# Patient Record
Sex: Female | Born: 1955 | Race: White | Hispanic: No | Smoking: Former smoker
Health system: Southern US, Community
[De-identification: ages and names within clinical notes are randomized; demographics above are authoritative.]

## PROBLEM LIST (undated history)

## (undated) DIAGNOSIS — IMO0001 Reserved for inherently not codable concepts without codable children: Secondary | ICD-10-CM

## (undated) DIAGNOSIS — J189 Pneumonia, unspecified organism: Secondary | ICD-10-CM

## (undated) DIAGNOSIS — F419 Anxiety disorder, unspecified: Secondary | ICD-10-CM

## (undated) DIAGNOSIS — K219 Gastro-esophageal reflux disease without esophagitis: Secondary | ICD-10-CM

## (undated) DIAGNOSIS — Z8709 Personal history of other diseases of the respiratory system: Secondary | ICD-10-CM

## (undated) DIAGNOSIS — I10 Essential (primary) hypertension: Secondary | ICD-10-CM

## (undated) DIAGNOSIS — R0602 Shortness of breath: Secondary | ICD-10-CM

## (undated) DIAGNOSIS — R112 Nausea with vomiting, unspecified: Secondary | ICD-10-CM

## (undated) DIAGNOSIS — R1310 Dysphagia, unspecified: Secondary | ICD-10-CM

## (undated) DIAGNOSIS — J439 Emphysema, unspecified: Secondary | ICD-10-CM

## (undated) DIAGNOSIS — Z860100 Personal history of colon polyps, unspecified: Secondary | ICD-10-CM

## (undated) DIAGNOSIS — G8929 Other chronic pain: Secondary | ICD-10-CM

## (undated) DIAGNOSIS — J449 Chronic obstructive pulmonary disease, unspecified: Secondary | ICD-10-CM

## (undated) DIAGNOSIS — R351 Nocturia: Secondary | ICD-10-CM

## (undated) DIAGNOSIS — M549 Dorsalgia, unspecified: Secondary | ICD-10-CM

## (undated) DIAGNOSIS — R12 Heartburn: Secondary | ICD-10-CM

## (undated) DIAGNOSIS — M797 Fibromyalgia: Secondary | ICD-10-CM

## (undated) DIAGNOSIS — K829 Disease of gallbladder, unspecified: Secondary | ICD-10-CM

## (undated) DIAGNOSIS — I639 Cerebral infarction, unspecified: Secondary | ICD-10-CM

## (undated) DIAGNOSIS — Z9889 Other specified postprocedural states: Secondary | ICD-10-CM

## (undated) DIAGNOSIS — F32A Depression, unspecified: Secondary | ICD-10-CM

## (undated) DIAGNOSIS — J45909 Unspecified asthma, uncomplicated: Secondary | ICD-10-CM

## (undated) DIAGNOSIS — E785 Hyperlipidemia, unspecified: Secondary | ICD-10-CM

## (undated) DIAGNOSIS — A630 Anogenital (venereal) warts: Secondary | ICD-10-CM

## (undated) HISTORY — DX: Shortness of breath: R06.02

## (undated) HISTORY — PX: POLYPECTOMY: SHX149

## (undated) HISTORY — DX: Dysphagia, unspecified: R13.10

## (undated) HISTORY — DX: Unspecified asthma, uncomplicated: J45.909

## (undated) HISTORY — PX: DIAGNOSTIC LAPAROSCOPY: SUR761

## (undated) HISTORY — DX: Anogenital (venereal) warts: A63.0

## (undated) HISTORY — DX: Emphysema, unspecified: J43.9

## (undated) HISTORY — DX: Heartburn: R12

## (undated) HISTORY — PX: ESOPHAGOGASTRODUODENOSCOPY: SHX1529

## (undated) HISTORY — PX: ADENOIDECTOMY: SUR15

## (undated) HISTORY — DX: Cerebral infarction, unspecified: I63.9

## (undated) HISTORY — DX: Anxiety disorder, unspecified: F41.9

## (undated) HISTORY — PX: ANTERIOR (CYSTOCELE) AND POSTERIOR REPAIR (RECTOCELE) WITH XENFORM GRAFT AND SACROSPINOUS FIXATION: SHX6492

## (undated) HISTORY — PX: BREAST SURGERY: SHX581

## (undated) HISTORY — DX: Depression, unspecified: F32.A

## (undated) HISTORY — DX: Disease of gallbladder, unspecified: K82.9

## (undated) HISTORY — PX: TONSILLECTOMY: SUR1361

## (undated) HISTORY — PX: NECK SURGERY: SHX720

---
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
## 1981-02-19 HISTORY — PX: CHOLECYSTECTOMY: SHX55

## 1981-02-19 HISTORY — PX: ABDOMINAL HYSTERECTOMY: SHX81

## 2008-10-08 ENCOUNTER — Encounter
Admission: RE | Admit: 2008-10-08 | Discharge: 2008-10-08 | Payer: Self-pay | Admitting: Physical Medicine and Rehabilitation

## 2008-12-07 ENCOUNTER — Emergency Department (HOSPITAL_COMMUNITY): Admission: EM | Admit: 2008-12-07 | Discharge: 2008-12-08 | Payer: Self-pay | Admitting: Emergency Medicine

## 2009-02-15 ENCOUNTER — Encounter: Admission: RE | Admit: 2009-02-15 | Discharge: 2009-02-15 | Payer: Self-pay | Admitting: Family Medicine

## 2009-02-19 HISTORY — PX: OTHER SURGICAL HISTORY: SHX169

## 2009-08-30 ENCOUNTER — Encounter: Admission: RE | Admit: 2009-08-30 | Discharge: 2009-08-30 | Payer: Self-pay | Admitting: Family Medicine

## 2009-10-03 ENCOUNTER — Encounter: Admission: RE | Admit: 2009-10-03 | Discharge: 2009-10-03 | Payer: Self-pay | Admitting: Surgery

## 2009-11-14 ENCOUNTER — Ambulatory Visit (HOSPITAL_COMMUNITY): Admission: RE | Admit: 2009-11-14 | Discharge: 2009-11-14 | Payer: Self-pay | Admitting: Surgery

## 2010-03-12 ENCOUNTER — Encounter: Payer: Self-pay | Admitting: Family Medicine

## 2010-05-04 LAB — COMPREHENSIVE METABOLIC PANEL
AST: 17 U/L (ref 0–37)
Alkaline Phosphatase: 79 U/L (ref 39–117)
BUN: 14 mg/dL (ref 6–23)
CO2: 28 mEq/L (ref 19–32)
Creatinine, Ser: 0.81 mg/dL (ref 0.4–1.2)
Glucose, Bld: 98 mg/dL (ref 70–99)
Sodium: 137 mEq/L (ref 135–145)
Total Bilirubin: 0.7 mg/dL (ref 0.3–1.2)
Total Protein: 7.2 g/dL (ref 6.0–8.3)

## 2010-05-04 LAB — DIFFERENTIAL
Basophils Absolute: 0 10*3/uL (ref 0.0–0.1)
Basophils Relative: 0 % (ref 0–1)
Eosinophils Relative: 2 % (ref 0–5)
Lymphs Abs: 2.3 10*3/uL (ref 0.7–4.0)
Monocytes Absolute: 0.5 10*3/uL (ref 0.1–1.0)
Monocytes Relative: 6 % (ref 3–12)

## 2010-05-04 LAB — CBC
MCV: 93.1 fL (ref 78.0–100.0)
Platelets: 208 10*3/uL (ref 150–400)

## 2010-05-04 LAB — SURGICAL PCR SCREEN
MRSA, PCR: NEGATIVE
Staphylococcus aureus: NEGATIVE

## 2010-05-25 LAB — URINALYSIS, ROUTINE W REFLEX MICROSCOPIC
Bilirubin Urine: NEGATIVE
Ketones, ur: NEGATIVE mg/dL
Protein, ur: NEGATIVE mg/dL
Specific Gravity, Urine: 1.021 (ref 1.005–1.030)
Urobilinogen, UA: 0.2 mg/dL (ref 0.0–1.0)

## 2010-05-25 LAB — URINE CULTURE: Colony Count: 30000

## 2010-05-25 LAB — COMPREHENSIVE METABOLIC PANEL
ALT: 33 U/L (ref 0–35)
AST: 21 U/L (ref 0–37)
Albumin: 3.8 g/dL (ref 3.5–5.2)
CO2: 30 mEq/L (ref 19–32)
Calcium: 10.1 mg/dL (ref 8.4–10.5)
Chloride: 101 mEq/L (ref 96–112)
Sodium: 141 mEq/L (ref 135–145)

## 2010-05-25 LAB — DIFFERENTIAL
Basophils Relative: 0 % (ref 0–1)
Neutro Abs: 8.5 10*3/uL — ABNORMAL HIGH (ref 1.7–7.7)

## 2010-05-25 LAB — CBC
MCV: 90.5 fL (ref 78.0–100.0)
Platelets: 215 10*3/uL (ref 150–400)

## 2010-05-25 LAB — URINE MICROSCOPIC-ADD ON

## 2010-08-07 ENCOUNTER — Emergency Department (HOSPITAL_COMMUNITY)
Admission: EM | Admit: 2010-08-07 | Discharge: 2010-08-08 | Disposition: A | Discharge status: Home / Self Care | Payer: Medicare Other | Attending: Emergency Medicine | Admitting: Emergency Medicine

## 2010-08-07 DIAGNOSIS — R51 Headache: Secondary | ICD-10-CM | POA: Insufficient documentation

## 2010-08-07 DIAGNOSIS — W1809XA Striking against other object with subsequent fall, initial encounter: Secondary | ICD-10-CM | POA: Insufficient documentation

## 2010-08-07 DIAGNOSIS — J449 Chronic obstructive pulmonary disease, unspecified: Secondary | ICD-10-CM | POA: Insufficient documentation

## 2010-08-07 DIAGNOSIS — S0003XA Contusion of scalp, initial encounter: Secondary | ICD-10-CM | POA: Insufficient documentation

## 2010-08-07 DIAGNOSIS — I10 Essential (primary) hypertension: Secondary | ICD-10-CM | POA: Insufficient documentation

## 2010-08-07 DIAGNOSIS — Y9289 Other specified places as the place of occurrence of the external cause: Secondary | ICD-10-CM | POA: Insufficient documentation

## 2010-08-07 DIAGNOSIS — S1093XA Contusion of unspecified part of neck, initial encounter: Secondary | ICD-10-CM | POA: Insufficient documentation

## 2010-08-07 DIAGNOSIS — S0990XA Unspecified injury of head, initial encounter: Secondary | ICD-10-CM | POA: Insufficient documentation

## 2010-08-07 DIAGNOSIS — H538 Other visual disturbances: Secondary | ICD-10-CM | POA: Insufficient documentation

## 2010-08-07 DIAGNOSIS — J4489 Other specified chronic obstructive pulmonary disease: Secondary | ICD-10-CM | POA: Insufficient documentation

## 2010-08-08 ENCOUNTER — Emergency Department (HOSPITAL_COMMUNITY): Payer: Medicare Other

## 2012-02-17 ENCOUNTER — Emergency Department (HOSPITAL_COMMUNITY): Payer: Medicare Other

## 2012-02-17 ENCOUNTER — Inpatient Hospital Stay (HOSPITAL_COMMUNITY)
Admission: EM | Admit: 2012-02-17 | Discharge: 2012-02-21 | DRG: 190 | Disposition: A | Discharge status: Home / Self Care | Payer: Medicare Other | Attending: Internal Medicine | Admitting: Internal Medicine

## 2012-02-17 ENCOUNTER — Encounter (HOSPITAL_COMMUNITY): Payer: Self-pay | Admitting: Family Medicine

## 2012-02-17 DIAGNOSIS — R0609 Other forms of dyspnea: Secondary | ICD-10-CM | POA: Diagnosis present

## 2012-02-17 DIAGNOSIS — J441 Chronic obstructive pulmonary disease with (acute) exacerbation: Principal | ICD-10-CM | POA: Diagnosis present

## 2012-02-17 DIAGNOSIS — R197 Diarrhea, unspecified: Secondary | ICD-10-CM | POA: Diagnosis present

## 2012-02-17 DIAGNOSIS — E669 Obesity, unspecified: Secondary | ICD-10-CM | POA: Diagnosis present

## 2012-02-17 DIAGNOSIS — R0902 Hypoxemia: Secondary | ICD-10-CM | POA: Diagnosis present

## 2012-02-17 DIAGNOSIS — J069 Acute upper respiratory infection, unspecified: Secondary | ICD-10-CM | POA: Diagnosis present

## 2012-02-17 DIAGNOSIS — F17200 Nicotine dependence, unspecified, uncomplicated: Secondary | ICD-10-CM | POA: Diagnosis present

## 2012-02-17 DIAGNOSIS — Z6830 Body mass index (BMI) 30.0-30.9, adult: Secondary | ICD-10-CM

## 2012-02-17 DIAGNOSIS — Z91199 Patient's noncompliance with other medical treatment and regimen due to unspecified reason: Secondary | ICD-10-CM

## 2012-02-17 DIAGNOSIS — N390 Urinary tract infection, site not specified: Secondary | ICD-10-CM | POA: Diagnosis present

## 2012-02-17 DIAGNOSIS — Z9119 Patient's noncompliance with other medical treatment and regimen: Secondary | ICD-10-CM

## 2012-02-17 DIAGNOSIS — J9620 Acute and chronic respiratory failure, unspecified whether with hypoxia or hypercapnia: Secondary | ICD-10-CM | POA: Diagnosis present

## 2012-02-17 DIAGNOSIS — R0602 Shortness of breath: Secondary | ICD-10-CM

## 2012-02-17 DIAGNOSIS — Z720 Tobacco use: Secondary | ICD-10-CM

## 2012-02-17 DIAGNOSIS — Z79899 Other long term (current) drug therapy: Secondary | ICD-10-CM

## 2012-02-17 DIAGNOSIS — I10 Essential (primary) hypertension: Secondary | ICD-10-CM | POA: Diagnosis present

## 2012-02-17 HISTORY — DX: Essential (primary) hypertension: I10

## 2012-02-17 HISTORY — DX: Chronic obstructive pulmonary disease, unspecified: J44.9

## 2012-02-17 LAB — COMPREHENSIVE METABOLIC PANEL
Albumin: 3.6 g/dL (ref 3.5–5.2)
Alkaline Phosphatase: 86 U/L (ref 39–117)
CO2: 25 mEq/L (ref 19–32)
Chloride: 98 mEq/L (ref 96–112)
Glucose, Bld: 106 mg/dL — ABNORMAL HIGH (ref 70–99)
Potassium: 3.6 mEq/L (ref 3.5–5.1)
Sodium: 137 mEq/L (ref 135–145)

## 2012-02-17 LAB — INFLUENZA PANEL BY PCR (TYPE A & B)
H1N1 flu by pcr: NOT DETECTED
Influenza B By PCR: NEGATIVE

## 2012-02-17 LAB — URINALYSIS, ROUTINE W REFLEX MICROSCOPIC
Ketones, ur: NEGATIVE mg/dL
Nitrite: NEGATIVE
Urobilinogen, UA: 1 mg/dL (ref 0.0–1.0)
pH: 5.5 (ref 5.0–8.0)

## 2012-02-17 LAB — CBC WITH DIFFERENTIAL/PLATELET
Basophils Absolute: 0 10*3/uL (ref 0.0–0.1)
Eosinophils Relative: 1 % (ref 0–5)
Hemoglobin: 12.8 g/dL (ref 12.0–15.0)
Lymphocytes Relative: 8 % — ABNORMAL LOW (ref 12–46)
Lymphs Abs: 0.5 10*3/uL — ABNORMAL LOW (ref 0.7–4.0)
MCH: 29.8 pg (ref 26.0–34.0)
MCHC: 32.3 g/dL (ref 30.0–36.0)
MCV: 92.1 fL (ref 78.0–100.0)
Neutro Abs: 4.9 10*3/uL (ref 1.7–7.7)
Neutrophils Relative %: 81 % — ABNORMAL HIGH (ref 43–77)
Platelets: 161 10*3/uL (ref 150–400)
WBC: 6 10*3/uL (ref 4.0–10.5)

## 2012-02-17 LAB — URINE MICROSCOPIC-ADD ON

## 2012-02-17 MED ORDER — HYDROMORPHONE HCL PF 1 MG/ML IJ SOLN: 0.5000 mg | INTRAMUSCULAR | Status: DC | PRN

## 2012-02-17 MED ORDER — ALBUTEROL SULFATE (5 MG/ML) 0.5% IN NEBU: 5.0000 mg | INHALATION_SOLUTION | RESPIRATORY_TRACT | Status: AC | PRN

## 2012-02-17 MED ORDER — SODIUM CHLORIDE 0.9 % IV BOLUS (SEPSIS)
500.0000 mL | Freq: Once | INTRAVENOUS | Status: AC
  Administered 2012-02-17: 500 mL via INTRAVENOUS

## 2012-02-17 MED ORDER — CEPHALEXIN 250 MG PO CAPS
500.0000 mg | ORAL_CAPSULE | Freq: Once | ORAL | Status: AC
  Administered 2012-02-17: 500 mg via ORAL
  Filled 2012-02-17: qty 2

## 2012-02-17 MED ORDER — DEXTROSE 5 % IV SOLN
1.0000 g | INTRAVENOUS | Status: DC
  Administered 2012-02-17 – 2012-02-18 (×2): 1 g via INTRAVENOUS
  Filled 2012-02-17 (×3): qty 10

## 2012-02-17 MED ORDER — ALBUTEROL SULFATE (5 MG/ML) 0.5% IN NEBU
5.0000 mg | INHALATION_SOLUTION | Freq: Once | RESPIRATORY_TRACT | Status: AC
  Administered 2012-02-17: 5 mg via RESPIRATORY_TRACT

## 2012-02-17 MED ORDER — ENOXAPARIN SODIUM 40 MG/0.4ML ~~LOC~~ SOLN
40.0000 mg | SUBCUTANEOUS | Status: DC
  Administered 2012-02-17 – 2012-02-20 (×4): 40 mg via SUBCUTANEOUS
  Filled 2012-02-17 (×5): qty 0.4

## 2012-02-17 MED ORDER — SODIUM CHLORIDE 0.9 % IV SOLN: INTRAVENOUS | Status: AC

## 2012-02-17 MED ORDER — OXYCODONE HCL 5 MG PO TABS
5.0000 mg | ORAL_TABLET | ORAL | Status: DC | PRN
  Administered 2012-02-17 – 2012-02-20 (×7): 5 mg via ORAL
  Filled 2012-02-17 (×7): qty 1

## 2012-02-17 MED ORDER — METHYLPREDNISOLONE SODIUM SUCC 125 MG IJ SOLR
125.0000 mg | Freq: Four times a day (QID) | INTRAMUSCULAR | Status: DC
  Administered 2012-02-17 – 2012-02-18 (×3): 125 mg via INTRAVENOUS
  Filled 2012-02-17 (×4): qty 2

## 2012-02-17 MED ORDER — ONDANSETRON HCL 4 MG/2ML IJ SOLN
4.0000 mg | Freq: Four times a day (QID) | INTRAMUSCULAR | Status: DC | PRN
  Administered 2012-02-18 – 2012-02-19 (×2): 4 mg via INTRAVENOUS
  Filled 2012-02-17 (×2): qty 2

## 2012-02-17 MED ORDER — ZOLPIDEM TARTRATE 5 MG PO TABS
5.0000 mg | ORAL_TABLET | Freq: Every evening | ORAL | Status: DC | PRN
  Administered 2012-02-19: 5 mg via ORAL
  Filled 2012-02-17: qty 1

## 2012-02-17 MED ORDER — ONDANSETRON HCL 4 MG/2ML IJ SOLN
4.0000 mg | Freq: Once | INTRAMUSCULAR | Status: AC
  Administered 2012-02-17: 4 mg via INTRAVENOUS
  Filled 2012-02-17: qty 2

## 2012-02-17 MED ORDER — IPRATROPIUM BROMIDE 0.02 % IN SOLN
0.5000 mg | RESPIRATORY_TRACT | Status: AC
  Administered 2012-02-17 (×2): 0.5 mg via RESPIRATORY_TRACT
  Filled 2012-02-17 (×2): qty 2.5

## 2012-02-17 MED ORDER — GUAIFENESIN ER 600 MG PO TB12
1200.0000 mg | ORAL_TABLET | Freq: Two times a day (BID) | ORAL | Status: DC
  Administered 2012-02-17 – 2012-02-21 (×8): 1200 mg via ORAL
  Filled 2012-02-17 (×9): qty 2

## 2012-02-17 MED ORDER — ALBUTEROL (5 MG/ML) CONTINUOUS INHALATION SOLN
INHALATION_SOLUTION | RESPIRATORY_TRACT | Status: AC
  Filled 2012-02-17: qty 40

## 2012-02-17 MED ORDER — METHYLPREDNISOLONE SODIUM SUCC 125 MG IJ SOLR
80.0000 mg | Freq: Three times a day (TID) | INTRAMUSCULAR | Status: DC
  Administered 2012-02-18: 03:00:00 via INTRAVENOUS
  Filled 2012-02-17: qty 1.28

## 2012-02-17 MED ORDER — ACETAMINOPHEN 325 MG PO TABS
650.0000 mg | ORAL_TABLET | Freq: Four times a day (QID) | ORAL | Status: DC | PRN
  Administered 2012-02-17 – 2012-02-19 (×2): 650 mg via ORAL
  Filled 2012-02-17 (×2): qty 2

## 2012-02-17 MED ORDER — ONDANSETRON HCL 4 MG/2ML IJ SOLN: 4.0000 mg | Freq: Three times a day (TID) | INTRAMUSCULAR | Status: AC | PRN

## 2012-02-17 MED ORDER — SODIUM CHLORIDE 0.9 % IV SOLN
INTRAVENOUS | Status: DC
  Administered 2012-02-17: 22:00:00 via INTRAVENOUS
  Administered 2012-02-18: 20 mL/h via INTRAVENOUS
  Administered 2012-02-19: 04:00:00 via INTRAVENOUS

## 2012-02-17 MED ORDER — BENZONATATE 100 MG PO CAPS
100.0000 mg | ORAL_CAPSULE | Freq: Once | ORAL | Status: AC
  Administered 2012-02-17: 100 mg via ORAL
  Filled 2012-02-17: qty 1

## 2012-02-17 MED ORDER — ALUM & MAG HYDROXIDE-SIMETH 200-200-20 MG/5ML PO SUSP
30.0000 mL | Freq: Four times a day (QID) | ORAL | Status: DC | PRN
  Filled 2012-02-17: qty 30

## 2012-02-17 MED ORDER — ALBUTEROL SULFATE (5 MG/ML) 0.5% IN NEBU
5.0000 mg | INHALATION_SOLUTION | RESPIRATORY_TRACT | Status: AC
  Administered 2012-02-17 (×2): 5 mg via RESPIRATORY_TRACT
  Filled 2012-02-17 (×2): qty 40

## 2012-02-17 MED ORDER — ACETAMINOPHEN 650 MG RE SUPP: 650.0000 mg | Freq: Four times a day (QID) | RECTAL | Status: DC | PRN

## 2012-02-17 MED ORDER — NICOTINE 21 MG/24HR TD PT24
21.0000 mg | MEDICATED_PATCH | Freq: Every day | TRANSDERMAL | Status: DC
  Administered 2012-02-17 – 2012-02-21 (×5): 21 mg via TRANSDERMAL
  Filled 2012-02-17 (×5): qty 1

## 2012-02-17 MED ORDER — ONDANSETRON HCL 4 MG PO TABS
4.0000 mg | ORAL_TABLET | Freq: Four times a day (QID) | ORAL | Status: DC | PRN
  Administered 2012-02-20: 4 mg via ORAL
  Filled 2012-02-17: qty 1

## 2012-02-17 MED ORDER — SODIUM CHLORIDE 0.9 % IJ SOLN
3.0000 mL | Freq: Two times a day (BID) | INTRAMUSCULAR | Status: DC
  Administered 2012-02-18 – 2012-02-21 (×4): 3 mL via INTRAVENOUS

## 2012-02-17 MED ORDER — METHYLPREDNISOLONE SODIUM SUCC 125 MG IJ SOLR
125.0000 mg | Freq: Once | INTRAMUSCULAR | Status: AC
  Administered 2012-02-17: 125 mg via INTRAVENOUS
  Filled 2012-02-17: qty 2

## 2012-02-17 NOTE — ED Notes (Signed)
Attempted to call report to floor.  Was told RN would return my call in a few minutes

## 2012-02-17 NOTE — ED Provider Notes (Signed)
History     CSN: [id number]  Arrival date & time 02/17/12  1610   None     Chief Complaint  Patient presents with  . Shortness of Breath  . Fever  . Diarrhea     HPI chief complaint shortness of breath. Onset 3 days ago. Location chest. Or worsened by anything. Severity moderate. Timing constant. Context: Symptoms identical to past COPD exacerbations. Positive family history for a progress report he illnesses in 3 family members. Regarding social history see nurse's notes. I reviewed the patient's past medical, past surgical, social history as well as medications and allergies.  Past Medical History  Diagnosis Date  . COPD (chronic obstructive pulmonary disease)   . Hypertension     Past Surgical History  Procedure Date  . Abdominal hysterectomy     History reviewed. No pertinent family history.  History  Substance Use Topics  . Smoking status: Current Every Day Smoker  . Smokeless tobacco: Not on file  . Alcohol Use: Yes    OB History    Grav Para Term Preterm Abortions TAB SAB Ect Mult Living                  Review of Systems  Constitutional: Positive for fever. Negative for chills.  HENT: Positive for rhinorrhea. Negative for congestion, sore throat, mouth sores, trouble swallowing, neck pain, neck stiffness, voice change and sinus pressure.   Eyes: Negative for pain, discharge and itching.  Respiratory: Positive for cough and shortness of breath. Negative for chest tightness.   Cardiovascular: Negative for chest pain, palpitations and leg swelling.  Gastrointestinal: Positive for diarrhea. Negative for nausea, vomiting, abdominal pain, constipation and blood in stool.  Genitourinary: Negative for dysuria, urgency, frequency, hematuria, flank pain, decreased urine volume, difficulty urinating and pelvic pain.  Musculoskeletal: Negative for back pain and joint swelling.  Skin: Negative for rash and wound.  Neurological: Negative for dizziness, tremors,  seizures, syncope, facial asymmetry, speech difficulty, weakness, light-headedness and numbness.  Hematological: Negative for adenopathy. Does not bruise/bleed easily.  Psychiatric/Behavioral: Negative for confusion and decreased concentration.    Allergies  Wellbutrin  Home Medications  No current outpatient prescriptions on file.  BP 151/91  Pulse 123  Temp 99.9 F (37.7 C)  Resp 24  SpO2 90%  Physical Exam  Constitutional: She is oriented to person, place, and time. She appears well-developed and well-nourished. No distress.  HENT:  Head: Normocephalic and atraumatic.  Eyes: Conjunctivae normal are normal. Right eye exhibits no discharge. Left eye exhibits no discharge. No scleral icterus.  Neck: Normal range of motion. Neck supple.  Cardiovascular: Normal rate, regular rhythm, normal heart sounds and intact distal pulses.   No murmur heard. Pulmonary/Chest: No accessory muscle usage. Tachypnea noted. No respiratory distress. She has decreased breath sounds in the right upper field, the right middle field, the right lower field, the left upper field, the left middle field and the left lower field. She has wheezes in the right upper field, the right middle field, the right lower field, the left upper field, the left middle field and the left lower field. She has no rhonchi. She has no rales. She exhibits no tenderness.  Abdominal: Soft. Bowel sounds are normal. She exhibits no distension and no mass. There is no tenderness. There is no rebound and no guarding.  Musculoskeletal: Normal range of motion. She exhibits no edema and no tenderness.  Neurological: She is alert and oriented to person, place, and time.  Skin: Skin  is warm and dry. She is not diaphoretic.  Psychiatric: She has a normal mood and affect.    ED Course  Procedures (including critical care time)  Labs Reviewed  CBC WITH DIFFERENTIAL - Abnormal; Notable for the following:    Neutrophils Relative 81 (*)      Lymphocytes Relative 8 (*)     Lymphs Abs 0.5 (*)     All other components within normal limits  COMPREHENSIVE METABOLIC PANEL - Abnormal; Notable for the following:    Glucose, Bld 106 (*)     AST 62 (*)     ALT 103 (*)     All other components within normal limits  URINALYSIS, ROUTINE W REFLEX MICROSCOPIC - Abnormal; Notable for the following:    APPearance CLOUDY (*)     Specific Gravity, Urine 1.038 (*)     Hgb urine dipstick SMALL (*)     Bilirubin Urine SMALL (*)     Protein, ur 100 (*)     Leukocytes, UA SMALL (*)     All other components within normal limits  URINE MICROSCOPIC-ADD ON - Abnormal; Notable for the following:    Squamous Epithelial / LPF FEW (*)     All other components within normal limits  POCT I-STAT TROPONIN I  INFLUENZA PANEL BY PCR  LEGIONELLA ANTIGEN, URINE  BASIC METABOLIC PANEL  CBC   Dg Chest 2 View  02/17/2012  *RADIOLOGY REPORT*  Clinical Data: Shortness of breath, cough, congestion.  Fever.  CHEST - 2 VIEW  Comparison: None  Findings: Heart and mediastinal contours are within normal limits. No focal opacities or effusions.  No acute bony abnormality.  IMPRESSION: No active cardiopulmonary disease.   Original Report Authenticated By: Charlett Nose, M.D.      1. SOB (shortness of breath)   2. Hypoxemia   3. UTI (lower urinary tract infection)   4. URI (upper respiratory infection)   5. Diarrhea   6. Tobacco abuse       MDM  Patient is a 56 year old female history of COPD noncompliant with medication since she lost her insurance presents with 3 days of shortness of breath wheezing. Patient reports fever with afebrile here. Patient also reports abrasive nonbloody diarrhea. Several family members at identical symptoms with one being hospitalized. Diffuse wheezing throughout. Patient received 3-albuterol nebulizer treatments with Atrovent. Patient desaturated 87% with ambulation after 3 treatments. Symptomatically improved after treatment. Chest  x-ray clear. Possible viral process with combined COPD exacerbation. Solu-Medrol provided. EKG with no acute ischemic findings. UTI noted. By mouth antibiotics provided. Admitted to hospitalist in stable and improved condition.        Consuello Masse, MD 02/18/12 [phone]

## 2012-02-17 NOTE — ED Notes (Signed)
Pt transported to xray 

## 2012-02-17 NOTE — ED Notes (Signed)
Attempted to call report to floor.  Was told RN would return my call 

## 2012-02-17 NOTE — ED Notes (Signed)
Pt sts since Friday she has had N,D cough, SOB and fever.

## 2012-02-17 NOTE — ED Notes (Addendum)
Pt up to bathroom. 87% on RA while ambulating. Pt started coughing and became short of breath quickly. Assisted back to bed. Will administer 3rd nebulizer treatment per resident, then re-attempt to ambulate.

## 2012-02-17 NOTE — H&P (Signed)
[facility] History and Physical  Tykia Mellone ZOX:[id number] DOB: 09-25-1955 DOA: 02/17/2012  Referring physician: EDP PCP: Elizabeth Palau, FNP  Specialists:   Chief Complaint: SOB   HPI: Samantha Hinton is a 56 y.o. female who presents to the ED with complaints of Worsening SOB and Chest Tightness and wheezing and fever and  URI symptoms as well as diarrhea for the past 3 days.   She denies any nausea or vomiting.  Several of her family members have been sick this week with similar symptoms.   She is a heavy smoker and smokes 2 ppd.      Review of Systems: The patient denies anorexia, fever, weight loss, vision loss, decreased hearing, hoarseness, chest pain, syncope, dyspnea on exertion, peripheral edema, balance deficits, hemoptysis, abdominal pain, nausea, vomiting, constipation, or hematemesis, melena, hematochezia, severe indigestion/heartburn, hematuria, incontinence, genital sores, muscle weakness, suspicious skin lesions, transient blindness, difficulty walking, depression, unusual weight change, abnormal bleeding, enlarged lymph nodes, angioedema, and breast masses.    Past Medical History  Diagnosis Date  . COPD (chronic obstructive pulmonary disease)   . Hypertension    Past Surgical History  Procedure Date  . Abdominal hysterectomy      Medications:  HOME MEDS: Prior to Admission medications   Medication Sig Start Date End Date Taking? Authorizing Provider  fish oil-omega-3 fatty acids 1000 MG capsule Take 1 g by mouth daily.   Yes Historical Provider, MD  ibuprofen (ADVIL,MOTRIN) 200 MG tablet Take 400 mg by mouth every 6 (six) hours as needed. For pain or fever   Yes Historical Provider, MD  Multiple Vitamin (MULTIVITAMIN WITH MINERALS) TABS Take 1 tablet by mouth daily.   Yes Historical Provider, MD  Phenylephrine-DM-GG-APAP (SUDAFED PE COLD/COUGH PO) Take 1 tablet by mouth every 4 (four) hours as needed. For cough/cold   Yes Historical Provider, MD    Phenylephrine-DM-GG-APAP (TYLENOL COLD/FLU SEVERE PO) Take 2 capsules by mouth every 6 (six) hours as needed. For cough and cold symptoms   Yes Historical Provider, MD  Pseudoephedrine-APAP-DM (DAYQUIL PO) Take 2 capsules by mouth every 4 (four) hours as needed. For cough   Yes Historical Provider, MD    Allergies:  Allergies  Allergen Reactions  . Wellbutrin (Bupropion)     Sick to her stomach    Social History:   reports that she has been smoking.  She does not have any smokeless tobacco history on file. She reports that she drinks alcohol. Her drug history not on file.  Family History: Family History  Problem Relation Age of Onset  . CAD Father   . CAD Sister   . Hypertension Father   . Hypertension Mother   . Cancer - Other Mother   . Diabetes Maternal Grandmother       Physical Exam:  GEN:  Pleasant 56 year old Obese Caucasian Female examined  and in no acute distress; cooperative with exam Filed Vitals:   02/17/12 1849 02/17/12 1900 02/17/12 1930 02/17/12 2000  BP: 134/82 154/92 132/72 136/82  Pulse: 122 121 123 125  Temp:      Resp: 25 24 27 24   SpO2: 91% 92% 90% 88%   Blood pressure 136/82, pulse 125, temperature 99.9 F (37.7 C), resp. rate 24, SpO2 88.00%. PSYCH: She is alert and oriented x4; does not appear anxious does not appear depressed; affect is normal HEENT: Normocephalic and Atraumatic, Mucous membranes pink; PERRLA; EOM intact; Fundi:  Benign;  No scleral icterus, Nares: Patent, Oropharynx: Clear, Fair Dentition, Neck:  FROM,  no cervical lymphadenopathy nor thyromegaly or carotid bruit; no JVD; Breasts:: Not examined CHEST WALL: No tenderness CHEST: Normal respiration, clear to auscultation bilaterally HEART: Regular rate and rhythm; no murmurs rubs or gallops BACK: No kyphosis or scoliosis; no CVA tenderness ABDOMEN: Positive Bowel Sounds, soft non-tender; no masses, no organomegaly.    Rectal Exam: Not done EXTREMITIES: No bone or joint  deformity; age-appropriate arthropathy of the hands and knees; no cyanosis, clubbing or edema; no ulcerations. Genitalia: not examined PULSES: 2+ and symmetric SKIN: Normal hydration no rash or ulceration CNS: Cranial nerves 2-12 grossly intact no focal neurologic deficit    Labs on Admission:  Basic Metabolic Panel:  Lab 02/17/12 1610  NA 137  K 3.6  CL 98  CO2 25  GLUCOSE 106*  BUN 10  CREATININE 0.62  CALCIUM 9.1  MG --  PHOS --   Liver Function Tests:  Lab 02/17/12 1646  AST 62*  ALT 103*  ALKPHOS 86  BILITOT 0.5  PROT 7.3  ALBUMIN 3.6   No results found for this basename: LIPASE:5,AMYLASE:5 in the last 168 hours No results found for this basename: AMMONIA:5 in the last 168 hours CBC:  Lab 02/17/12 1646  WBC 6.0  NEUTROABS 4.9  HGB 12.8  HCT 39.6  MCV 92.1  PLT 161   Cardiac Enzymes: No results found for this basename: CKTOTAL:5,CKMB:5,CKMBINDEX:5,TROPONINI:5 in the last 168 hours  BNP (last 3 results) No results found for this basename: PROBNP:3 in the last 8760 hours CBG: No results found for this basename: GLUCAP:5 in the last 168 hours  Radiological Exams on Admission: Dg Chest 2 View  02/17/2012  *RADIOLOGY REPORT*  Clinical Data: Shortness of breath, cough, congestion.  Fever.  CHEST - 2 VIEW  Comparison: None  Findings: Heart and mediastinal contours are within normal limits. No focal opacities or effusions.  No acute bony abnormality.  IMPRESSION: No active cardiopulmonary disease.   Original Report Authenticated By: Charlett Nose, M.D.     EKG: Independently reviewed. Sinus Tachycardia Rate of 118 No acute changes.     Assessment/Plan Present on Admission:  . COPD exacerbation . SOB (shortness of breath) . Hypoxemia . UTI (lower urinary tract infection) . URI (upper respiratory infection) . Diarrhea . Tobacco abuse    Plan:        Admit to Telemetry Bed IV Steroid taper Nebs, O2 IV Rocephin for UTI IVFs Reconcile Home  Medications Smoking Cessation Counseling Done Nicotine Patch DVT Prophylaxis   Code Status: FULL CODE Family Communication:  Husband at Bedside Disposition Plan:  Return to Home on Discharge  Time spent: 84 Minutes  Samantha Hinton [facility] Pager [phone]  If 7PM-7AM, please contact night-coverage [email or web address] Password [facility] 02/17/2012, 8:56 PM

## 2012-02-18 DIAGNOSIS — J441 Chronic obstructive pulmonary disease with (acute) exacerbation: Principal | ICD-10-CM

## 2012-02-18 LAB — BASIC METABOLIC PANEL
CO2: 27 mEq/L (ref 19–32)
Calcium: 8.8 mg/dL (ref 8.4–10.5)
Chloride: 99 mEq/L (ref 96–112)
Creatinine, Ser: 0.59 mg/dL (ref 0.50–1.10)
GFR calc Af Amer: >90 mL/min (ref >90)
Sodium: 139 mEq/L (ref 135–145)

## 2012-02-18 LAB — LEGIONELLA ANTIGEN, URINE: Legionella Antigen, Urine: NEGATIVE

## 2012-02-18 LAB — CBC
MCV: 91.8 fL (ref 78.0–100.0)
Platelets: 172 10*3/uL (ref 150–400)
RBC: 4.28 MIL/uL (ref 3.87–5.11)
RDW: 12.9 % (ref 11.5–15.5)
WBC: 6.3 10*3/uL (ref 4.0–10.5)

## 2012-02-18 MED ORDER — METHYLPREDNISOLONE SODIUM SUCC 40 MG IJ SOLR
40.0000 mg | Freq: Two times a day (BID) | INTRAMUSCULAR | Status: DC
  Administered 2012-02-18 – 2012-02-20 (×4): 40 mg via INTRAVENOUS
  Filled 2012-02-18 (×6): qty 1

## 2012-02-18 MED ORDER — OSELTAMIVIR PHOSPHATE 75 MG PO CAPS
75.0000 mg | ORAL_CAPSULE | Freq: Two times a day (BID) | ORAL | Status: DC
  Administered 2012-02-18: 75 mg via ORAL
  Filled 2012-02-18 (×3): qty 1

## 2012-02-18 MED ORDER — POTASSIUM CHLORIDE CRYS ER 20 MEQ PO TBCR
20.0000 meq | EXTENDED_RELEASE_TABLET | Freq: Two times a day (BID) | ORAL | Status: AC
  Administered 2012-02-18 – 2012-02-19 (×4): 20 meq via ORAL
  Filled 2012-02-18 (×4): qty 1

## 2012-02-18 MED ORDER — ALBUTEROL SULFATE (5 MG/ML) 0.5% IN NEBU: 2.5000 mg | INHALATION_SOLUTION | RESPIRATORY_TRACT | Status: DC | PRN

## 2012-02-18 MED ORDER — DEXTROSE 5 % IV SOLN: 1.0000 g | INTRAVENOUS | Status: DC

## 2012-02-18 NOTE — Progress Notes (Signed)
Patient seen and examined by me.  Still wheezing.  No fever, no chills.  +diarrhea.  Marlin Canary DO

## 2012-02-18 NOTE — ED Provider Notes (Signed)
  I performed a history and physical examination of Samantha Hinton and discussed her management with Dr. March Rummage.  I agree with the history, physical, assessment, and plan of care, with the following exceptions: None   Date: 02/18/2012  Rate: 118  Rhythm: sinus tachycardia  QRS Axis: normal  Intervals: normal  ST/T Wave abnormalities: nonspecific ST changes  Conduction Disutrbances:none  Narrative Interpretation:   Old EKG Reviewed: changes noted Faster -abnormal   Marilynne Dupuis, Elvis Coil, MD 02/18/12 [phone]

## 2012-02-18 NOTE — Progress Notes (Signed)
Utilization review completed.  

## 2012-02-18 NOTE — Progress Notes (Signed)
[facility] PROGRESS NOTE  Samantha Hinton YQM:[id number] DOB: [birth year] DOA: 02/17/2012 PCP: Samantha Palau, FNP  Assessment/Plan:  . COPD exacerbation Likely caused by viral illness - influenza panel pending. On Rocephin IV, steroids and nebulizers.  Marland Kitchen URI (upper respiratory infection) CXR clear Likely viral Influenza PCR pending Started on tamiflu 02/18/12  . Hypoxemia On oxygen therapy to maintain sats of 88 - 92% Will obtain ambulating oxygen sats.  Marland Kitchen UTI (lower urinary tract infection) U/A appears positive for infection Culture pending. Started on Rocephin 12/29 at admission.  . Diarrhea Primarily had diarrhea 12/29.  Only one BM thus far 12/30. Continue to monitor. IV Fluids Check Bmet in am. Mildly hypokalemic (will replete today)  . Tobacco abuse Cessation counseling ordered Nicotine Patch.  Code Status: full Family Communication:  Disposition Plan: to home when appropriate.   Consultants:    Procedures:    Antibiotics:    HPI/Subjective: Samantha Hinton is a 56 y.o. female who presents to the ED with complaints of Worsening SOB and Chest Tightness and wheezing and fever and URI symptoms as well as diarrhea for the past 3 days. She denies any nausea or vomiting. Several of her family members have been sick this week with similar symptoms. She is a heavy smoker and smokes 2 ppd.    Objective: Filed Vitals:   02/17/12 2130 02/17/12 2229 02/18/12 0534 02/18/12 1012  BP: 101/52 138/60 168/78 146/82  Pulse: 118 112 87 97  Temp:  99.1 F (37.3 C) 97.3 F (36.3 C) 99 F (37.2 C)  TempSrc:  Oral Oral Oral  Resp: 25 20 20 18   Height:  5\' 6"  (1.676 m)    Weight:  86.637 kg (191 lb)    SpO2: 90% 92% 97% 90%    Intake/Output Summary (Last 24 hours) at 02/18/12 1038 Last data filed at 02/18/12 0900  Gross per 24 hour  Intake   1980 ml  Output    800 ml  Net   1180 ml   Filed Weights   02/17/12 2229  Weight: 86.637 kg (191 lb)     Exam:   General:  A&O appears fatigued. Face moderately flushed.  Cardiovascular: slightly tachycardic, no M/R/G  Respiratory: Bilateral mild expiratory wheeze, no rhonchi or crackles.    Abdomen: Soft, Nt, Nd, Active BS, no masses  Data Reviewed: Basic Metabolic Panel:  Lab 02/18/12 5284 02/17/12 1646  NA 139 137  K 3.4* 3.6  CL 99 98  CO2 27 25  GLUCOSE 149* 106*  BUN 11 10  CREATININE 0.59 0.62  CALCIUM 8.8 9.1  MG -- --  PHOS -- --   Liver Function Tests:  Lab 02/17/12 1646  AST 62*  ALT 103*  ALKPHOS 86  BILITOT 0.5  PROT 7.3  ALBUMIN 3.6  CBC:  Lab 02/18/12 0730 02/17/12 1646  WBC 6.3 6.0  NEUTROABS -- 4.9  HGB 13.0 12.8  HCT 39.3 39.6  MCV 91.8 92.1  PLT 172 161     Studies: Dg Chest 2 View  02/17/2012  *RADIOLOGY REPORT*  Clinical Data: Shortness of breath, cough, congestion.  Fever.  CHEST - 2 VIEW  Comparison: None  Findings: Heart and mediastinal contours are within normal limits. No focal opacities or effusions.  No acute bony abnormality.  IMPRESSION: No active cardiopulmonary disease.   Original Report Authenticated By: Charlett Nose, M.D.     Scheduled Meds:   . sodium chloride   Intravenous STAT  . cefTRIAXone (ROCEPHIN)  IV  1 g Intravenous Q24H  .  enoxaparin (LOVENOX) injection  40 mg Subcutaneous Q24H  . guaiFENesin  1,200 mg Oral BID  . methylPREDNISolone (SOLU-MEDROL) injection  125 mg Intravenous Q6H  . methylPREDNISolone (SOLU-MEDROL) injection  80 mg Intravenous Q8H  . nicotine  21 mg Transdermal Daily  . oseltamivir  75 mg Oral BID  . sodium chloride  3 mL Intravenous Q12H   Continuous Infusions:   . sodium chloride 125 mL/hr at 02/17/12 2224    Principal Problem:  *URI (upper respiratory infection) Active Problems:  COPD exacerbation  SOB (shortness of breath)  Hypoxemia  UTI (lower urinary tract infection)  Diarrhea  Tobacco abuse    Time spent: 40 min.    Conley Canal [facility] Pager [phone]. If 8PM-8AM, please contact night-coverage at [email or web address], password [facility] 02/18/2012, 10:38 AM  LOS: 1 day

## 2012-02-19 LAB — URINE CULTURE: Colony Count: NO GROWTH

## 2012-02-19 LAB — BASIC METABOLIC PANEL
BUN: 17 mg/dL (ref 6–23)
CO2: 30 mEq/L (ref 19–32)
Calcium: 8.7 mg/dL (ref 8.4–10.5)
Creatinine, Ser: 0.62 mg/dL (ref 0.50–1.10)
GFR calc non Af Amer: >90 mL/min (ref >90)
Glucose, Bld: 113 mg/dL — ABNORMAL HIGH (ref 70–99)
Sodium: 139 mEq/L (ref 135–145)

## 2012-02-19 MED ORDER — HYDRALAZINE HCL 20 MG/ML IJ SOLN
10.0000 mg | Freq: Four times a day (QID) | INTRAMUSCULAR | Status: DC | PRN
  Filled 2012-02-19: qty 0.5

## 2012-02-19 MED ORDER — IPRATROPIUM BROMIDE 0.02 % IN SOLN
RESPIRATORY_TRACT | Status: AC
  Administered 2012-02-19: 0.5 mg via RESPIRATORY_TRACT
  Filled 2012-02-19: qty 2.5

## 2012-02-19 MED ORDER — DEXTROSE 5 % IV SOLN
500.0000 mg | INTRAVENOUS | Status: DC
  Administered 2012-02-19: 500 mg via INTRAVENOUS
  Filled 2012-02-19 (×2): qty 500

## 2012-02-19 MED ORDER — IPRATROPIUM BROMIDE 0.02 % IN SOLN
0.5000 mg | Freq: Four times a day (QID) | RESPIRATORY_TRACT | Status: DC
  Administered 2012-02-19 – 2012-02-21 (×8): 0.5 mg via RESPIRATORY_TRACT
  Filled 2012-02-19 (×8): qty 2.5

## 2012-02-19 MED ORDER — ALBUTEROL SULFATE (5 MG/ML) 0.5% IN NEBU
INHALATION_SOLUTION | RESPIRATORY_TRACT | Status: AC
  Administered 2012-02-19: 2.5 mg via RESPIRATORY_TRACT
  Filled 2012-02-19: qty 0.5

## 2012-02-19 MED ORDER — ALBUTEROL SULFATE (5 MG/ML) 0.5% IN NEBU
2.5000 mg | INHALATION_SOLUTION | Freq: Four times a day (QID) | RESPIRATORY_TRACT | Status: DC
  Administered 2012-02-19 – 2012-02-21 (×8): 2.5 mg via RESPIRATORY_TRACT
  Filled 2012-02-19 (×8): qty 0.5

## 2012-02-19 NOTE — Clinical Documentation Improvement (Signed)
RESPIRATORY FAILURE DOCUMENTATION CLARIFICATION QUERY   THIS DOCUMENT IS NOT A PERMANENT PART OF THE MEDICAL RECORD  TO RESPOND TO THE THIS QUERY, FOLLOW THE INSTRUCTIONS BELOW:  1. If needed, update documentation for the patient's encounter via the notes activity.  2. Access this query again and click edit on the In [employer].  3. After updating, or not, click F2 to complete all highlighted (required) fields concerning your review. Select "additional documentation in the medical record" OR "no additional documentation provided".  4. Click Sign note button.  5. The deficiency will fall out of your In Basket *Please let us know if you are not able to complete this workflow by phone or e-mail (listed below).  Please update your documentation within the medical record to reflect your response to this query.                                                                                     02/19/12  Dear Algis Downs PA Marton Redwood,  In a better effort to capture your patient's severity of illness, reflect appropriate length of stay and utilization of resources, a review of the patient medical record has revealed the following indicators.    Based on your clinical judgment, please clarify and document in a progress note and/or discharge summary the clinical condition associated with the following supporting information:  In responding to this query please exercise your independent judgment.  The fact that a query is asked, does not imply that any particular answer is desired or expected.  Possible Clinical Conditions?  Acute Respiratory Failure Acute on Chronic Respiratory Failure Chronic Respiratory Failure Other Condition Cannot Clinically Determine   Supporting Information:  Risk Factors: (As per notes)"COPD EXACERBATION", ". Hypoxemia On oxygen therapy to maintain sats of 88 - 92% Will obtain ambulating oxygen sats. (request again today)"  Signs&Symptoms: (As per  notes):Patient's breathing not improved today.  (sounds tight) Likely caused by viral illness - influenza panel negative. Continue IV Steroids 40 mg q 12 Schedule nebulizers (patients breathing did not improve overnight)  Radiology:  Treatment: Oxygen:On oxygen therapy to maintain sats of 88 - 92% O2 concentrations:2L/M O2 Mode: ([place])  Respiratory Treatment: nebulizers  Treatment:IV Steroids 40 mg q 12                   You may use possible, probable, or suspect with inpatient documentation. possible, probable, suspected diagnoses MUST be documented at the time of discharge  Reviewed: additional documentation in the medical record  Thank You,  Joanette Gula Delk RN, BSN, CCDS Clinical Documentation Specialist: [phone] Pager Health Information Management Person

## 2012-02-19 NOTE — Progress Notes (Signed)
Patient seen and examined by me.  Plan to continue current treatment.  Appetite better but breathing still short and patient is wheezing.

## 2012-02-19 NOTE — Progress Notes (Addendum)
[facility] PROGRESS NOTE  Addendum after clinical documentation query.  Samantha Hinton:[id number] DOB: April 14, 1955 DOA: 02/17/2012 PCP: Elizabeth Palau, FNP  Assessment/Plan:  . Acute on Chronic Respiratory failure secondary to COPD exacerbation Patient's breathing not improved today.  (sounds tight) Likely caused by viral illness - influenza panel negative. Continue IV Steroids 40 mg q 12 Schedule nebulizers (patients breathing did not improve overnight) Add Azithromycin.  Patient's insurance had lapsed - but will become effective again 02/20/2012.  Her home breathing medications had run out months ago.  She will need prescriptions for these.  Marland Kitchen URI (upper respiratory infection) CXR clear Likely viral   . Hypoxemia On oxygen therapy to maintain sats of 88 - 92% Will obtain ambulating oxygen sats. (request again today)  . UTI (lower urinary tract infection) U/A appears positive for infection Culture negative.  D/C rocephin. Started on Rocephin 12/29 at admission.  . Diarrhea Patient with diarrhea 12/29, 12/30. Check stool culture and C-Diff PCR.  If c-diff negative consider immodium. IV Fluids Check Bmet in am.   . Tobacco abuse Patient has committed to quit Nicotine Patch.  Code Status: full Family Communication:  Disposition Plan: to home when appropriate.   Consultants:    Procedures:    Antibiotics:  Rocephin 12/29 - 12/31  Azithromycin 12/31 - >  HPI/Subjective: Samantha Hinton is a 56 y.o. female who presents to the ED with complaints of Worsening SOB and Chest Tightness and wheezing and fever and URI symptoms as well as diarrhea for the past 3 days. She denies any nausea or vomiting. Several of her family members have been sick this week with similar symptoms. She is a heavy smoker and smokes 2 ppd.    Objective: Filed Vitals:   02/19/12 0550 02/19/12 0620 02/19/12 0700 02/19/12 0841  BP: 180/74 156/72  174/90  Pulse: 86   86  Temp:  97.4 F (36.3 C)   97.5 F (36.4 C)  TempSrc: Oral   Oral  Resp: 20   20  Height:      Weight:      SpO2: 95%  93% 93%    Intake/Output Summary (Last 24 hours) at 02/19/12 1019 Last data filed at 02/19/12 0641  Gross per 24 hour  Intake 1913.75 ml  Output    650 ml  Net 1263.75 ml   Filed Weights   02/17/12 2229 02/18/12 2035  Weight: 86.637 kg (191 lb) 86.637 kg (191 lb)    Exam:   General:  A&O, sitting up. Eating breakfast  Cardiovascular: slightly tachycardic, no M/R/G  Respiratory: decreased air movement today.  Bilateral mild expiratory wheeze, no rhonchi or crackles.    Abdomen: Soft, Nt, Nd, Active BS, no masses  Data Reviewed: Basic Metabolic Panel:  Lab 02/19/12 8119 02/18/12 0730 02/17/12 1646  NA 139 139 137  K 3.8 3.4* 3.6  CL 100 99 98  CO2 30 27 25   GLUCOSE 113* 149* 106*  BUN 17 11 10   CREATININE 0.62 0.59 0.62  CALCIUM 8.7 8.8 9.1  MG -- -- --  PHOS -- -- --   Liver Function Tests:  Lab 02/17/12 1646  AST 62*  ALT 103*  ALKPHOS 86  BILITOT 0.5  PROT 7.3  ALBUMIN 3.6  CBC:  Lab 02/18/12 0730 02/17/12 1646  WBC 6.3 6.0  NEUTROABS -- 4.9  HGB 13.0 12.8  HCT 39.3 39.6  MCV 91.8 92.1  PLT 172 161     Studies: Dg Chest 2 View  02/17/2012  *RADIOLOGY REPORT*  Clinical Data: Shortness of breath, cough, congestion.  Fever.  CHEST - 2 VIEW  Comparison: None  Findings: Heart and mediastinal contours are within normal limits. No focal opacities or effusions.  No acute bony abnormality.  IMPRESSION: No active cardiopulmonary disease.   Original Report Authenticated By: Charlett Nose, M.D.     Scheduled Meds:    . albuterol  2.5 mg Nebulization Q6H  . azithromycin  500 mg Intravenous Q24H  . enoxaparin (LOVENOX) injection  40 mg Subcutaneous Q24H  . guaiFENesin  1,200 mg Oral BID  . ipratropium  0.5 mg Nebulization QID  . methylPREDNISolone (SOLU-MEDROL) injection  40 mg Intravenous Q12H  . nicotine  21 mg Transdermal Daily  .  potassium chloride  20 mEq Oral BID  . sodium chloride  3 mL Intravenous Q12H   Continuous Infusions:    . sodium chloride 20 mL/hr at 02/19/12 0411    Principal Problem:  *URI (upper respiratory infection) Active Problems:  COPD exacerbation  SOB (shortness of breath)  Hypoxemia  UTI (lower urinary tract infection)  Diarrhea  Tobacco abuse    Time spent: 40 min.    Conley Canal [facility] Pager [phone]. If 8PM-8AM, please contact night-coverage at [email or web address], password [facility] 02/19/2012, 10:19 AM  LOS: 2 days

## 2012-02-20 LAB — CLOSTRIDIUM DIFFICILE BY PCR: Toxigenic C. Difficile by PCR: NEGATIVE

## 2012-02-20 MED ORDER — LEVOFLOXACIN 750 MG PO TABS
750.0000 mg | ORAL_TABLET | Freq: Every day | ORAL | Status: DC
  Administered 2012-02-20 – 2012-02-21 (×2): 750 mg via ORAL
  Filled 2012-02-20 (×2): qty 1

## 2012-02-20 MED ORDER — PREDNISONE 20 MG PO TABS
40.0000 mg | ORAL_TABLET | Freq: Every day | ORAL | Status: DC
  Administered 2012-02-21: 40 mg via ORAL
  Filled 2012-02-20 (×2): qty 2

## 2012-02-20 NOTE — Progress Notes (Signed)
[facility] PROGRESS NOTE  Samantha Hinton AVW:[id number] DOB: March 03, 1955 DOA: 02/17/2012 PCP: Samantha Palau, FNP  Assessment/Plan:  . COPD exacerbation Patient's breathing not improved today.  (sounds tight) Likely caused by viral illness - influenza panel negative. Change to PO steroids Schedule nebulizers (patients breathing did not improve overnight) Change abx to levaquin. Will check O2 sats on RA and ambulatnig  Patient's insurance had lapsed - but will become effective again 02/20/2012.  Her home breathing medications had run out months ago.  She will need prescriptions for these- consulted care managment.  Marland Kitchen URI (upper respiratory infection) CXR clear Likely viral- but will give abx   . Hypoxemia- acute respiratory failure On oxygen therapy to maintain sats of 88 - 92% Will obtain ambulating oxygen sats. (request again today)  . UTI (lower urinary tract infection) U/A appears positive for infection Culture negative.  D/C rocephin. Started on Rocephin 12/29 at admission.  . Diarrhea Patient with diarrhea 12/29, 12/30. improving   . Tobacco abuse Patient has committed to quit Nicotine Patch.  Code Status: full Family Communication:  Disposition Plan: to home when appropriate- hopefully on Thursday   Consultants:    Procedures:    Antibiotics:  Rocephin 12/29 - 12/31  Azithromycin 12/31 - >  HPI/Subjective: Samantha Hinton is a 57 y.o. female who presents to the ED with complaints of Worsening SOB and Chest Tightness and wheezing and fever and URI symptoms as well as diarrhea for the past 3 days. She denies any nausea or vomiting. Several of her family members have been sick this week with similar symptoms. She is a heavy smoker and smokes 2 ppd.   overall feeling better   Objective: Filed Vitals:   02/19/12 2045 02/20/12 0517 02/20/12 1000 02/20/12 1025  BP:  175/70 156/68   Pulse: 75 68 92   Temp:  98.2 F (36.8 C) 98 F (36.7 C)     TempSrc:  Oral Oral   Resp: 20 20 20    Height:      Weight:      SpO2: 94% 97% 96% 93%    Intake/Output Summary (Last 24 hours) at 02/20/12 1426 Last data filed at 02/20/12 0600  Gross per 24 hour  Intake 1426.33 ml  Output    875 ml  Net 551.33 ml   Filed Weights   02/17/12 2229 02/18/12 2035 02/19/12 2029  Weight: 86.637 kg (191 lb) 86.637 kg (191 lb) 86.637 kg (191 lb)    Exam:   General:  A&O, sitting up. Eating breakfast  Cardiovascular: slightly tachycardic, no M/R/G  Respiratory:  Bilateral mild expiratory wheeze, no rhonchi or crackles.    Abdomen: Soft, Nt, Nd, Active BS, no masses  Data Reviewed: Basic Metabolic Panel:  Lab 02/19/12 8295 02/18/12 0730 02/17/12 1646  NA 139 139 137  K 3.8 3.4* 3.6  CL 100 99 98  CO2 30 27 25   GLUCOSE 113* 149* 106*  BUN 17 11 10   CREATININE 0.62 0.59 0.62  CALCIUM 8.7 8.8 9.1  MG -- -- --  PHOS -- -- --   Liver Function Tests:  Lab 02/17/12 1646  AST 62*  ALT 103*  ALKPHOS 86  BILITOT 0.5  PROT 7.3  ALBUMIN 3.6  CBC:  Lab 02/18/12 0730 02/17/12 1646  WBC 6.3 6.0  NEUTROABS -- 4.9  HGB 13.0 12.8  HCT 39.3 39.6  MCV 91.8 92.1  PLT 172 161     Studies: No results found.  Scheduled Meds:    . albuterol  2.5 mg Nebulization Q6H  . enoxaparin (LOVENOX) injection  40 mg Subcutaneous Q24H  . guaiFENesin  1,200 mg Oral BID  . ipratropium  0.5 mg Nebulization QID  . levofloxacin  750 mg Oral Daily  . nicotine  21 mg Transdermal Daily  . predniSONE  40 mg Oral Q breakfast  . sodium chloride  3 mL Intravenous Q12H   Continuous Infusions:    . sodium chloride 20 mL/hr at 02/19/12 0411    Principal Problem:  *URI (upper respiratory infection) Active Problems:  COPD exacerbation  SOB (shortness of breath)  Hypoxemia  UTI (lower urinary tract infection)  Diarrhea  Tobacco abuse    Time spent: 40 min.    Marlin Canary,  [facility] Pager [phone]. If 8PM-8AM, please contact  night-coverage at [email or web address], password [facility] 02/20/2012, 2:26 PM  LOS: 3 days

## 2012-02-21 MED ORDER — PREDNISONE 10 MG PO TABS: ORAL_TABLET | ORAL | Status: DC

## 2012-02-21 MED ORDER — BUDESONIDE-FORMOTEROL FUMARATE 80-4.5 MCG/ACT IN AERO
2.0000 | INHALATION_SPRAY | Freq: Two times a day (BID) | RESPIRATORY_TRACT | Status: DC
  Filled 2012-02-21: qty 6.9

## 2012-02-21 MED ORDER — TIOTROPIUM BROMIDE MONOHYDRATE 18 MCG IN CAPS: 18.0000 ug | ORAL_CAPSULE | Freq: Every day | RESPIRATORY_TRACT | Status: DC

## 2012-02-21 MED ORDER — BUDESONIDE-FORMOTEROL FUMARATE 80-4.5 MCG/ACT IN AERO: 2.0000 | INHALATION_SPRAY | Freq: Two times a day (BID) | RESPIRATORY_TRACT | Status: DC

## 2012-02-21 MED ORDER — TIOTROPIUM BROMIDE MONOHYDRATE 18 MCG IN CAPS
18.0000 ug | ORAL_CAPSULE | Freq: Every day | RESPIRATORY_TRACT | Status: DC
  Filled 2012-02-21: qty 5

## 2012-02-21 MED ORDER — ALBUTEROL SULFATE HFA 108 (90 BASE) MCG/ACT IN AERS
2.0000 | INHALATION_SPRAY | RESPIRATORY_TRACT | Status: DC | PRN
  Filled 2012-02-21: qty 6.7

## 2012-02-21 MED ORDER — ALBUTEROL SULFATE HFA 108 (90 BASE) MCG/ACT IN AERS: 2.0000 | INHALATION_SPRAY | RESPIRATORY_TRACT | Status: DC | PRN

## 2012-02-21 MED ORDER — ALBUTEROL SULFATE (2.5 MG/3ML) 0.083% IN NEBU: 2.5000 mg | INHALATION_SOLUTION | Freq: Four times a day (QID) | RESPIRATORY_TRACT | Status: DC | PRN

## 2012-02-21 MED ORDER — NICOTINE 21 MG/24HR TD PT24: 1.0000 | MEDICATED_PATCH | Freq: Every day | TRANSDERMAL | Status: DC

## 2012-02-21 NOTE — Progress Notes (Addendum)
On reevaluation-lungs better-but still with some amount of wheezing. Patient has been anxious to go home the entire day, she is now requesting that she be discharged. She is accepting all risks of re-hospitalization and further worsening of her COPD leading to respiratory failure-with risk of significant disability and death. Please note she is awake and alert. She is now being discharged home at her own request.

## 2012-02-21 NOTE — Progress Notes (Signed)
Pt. Got d/c instructions and prescriptions.3 inhalers(spiriva,proventil,symbicort)were given to pt. To take home as per MD. Request.pt. Ready to go home.IV was d/c.tele was d/c.

## 2012-02-21 NOTE — Discharge Summary (Signed)
PATIENT DETAILS Name: Samantha Hinton Age: 57 y.o. Sex: female Date of Birth: [birth year] MRN: [id number]. Admit Date: 02/17/2012 Admitting Physician: Ron Parker, MD WUJ:[id number],GNFAOZ, FNP  Recommendations for Outpatient Follow-up:  1. Please optimize COPD medication 2. Gen. health maintenance 3. Continue tobacco cessation counseling  PRIMARY DISCHARGE DIAGNOSIS:  Principal Problem:  *URI (upper respiratory infection) Active Problems:  COPD exacerbation  SOB (shortness of breath)  Hypoxemia  UTI (lower urinary tract infection)  Diarrhea  Tobacco abuse      PAST MEDICAL HISTORY: Past Medical History  Diagnosis Date  . COPD (chronic obstructive pulmonary disease)   . Hypertension     DISCHARGE MEDICATIONS:   Medication List     As of 02/21/2012  3:34 PM    STOP taking these medications         DAYQUIL PO      fish oil-omega-3 fatty acids 1000 MG capsule      ibuprofen 200 MG tablet   Commonly known as: ADVIL,MOTRIN      multivitamin with minerals Tabs      SUDAFED PE COLD/COUGH PO      TYLENOL COLD/FLU SEVERE PO      TAKE these medications         albuterol 108 (90 BASE) MCG/ACT inhaler   Commonly known as: PROVENTIL HFA;VENTOLIN HFA   Inhale 2 puffs into the lungs every 4 (four) hours as needed for wheezing or shortness of breath.      albuterol (2.5 MG/3ML) 0.083% nebulizer solution   Commonly known as: PROVENTIL   Take 3 mLs (2.5 mg total) by nebulization every 6 (six) hours as needed for wheezing.      budesonide-formoterol 80-4.5 MCG/ACT inhaler   Commonly known as: SYMBICORT   Inhale 2 puffs into the lungs 2 (two) times daily.      nicotine 21 mg/24hr patch   Commonly known as: NICODERM CQ - dosed in mg/24 hours   Place 1 patch onto the skin daily.      predniSONE 10 MG tablet   Commonly known as: DELTASONE   Take 4 tablets daily for 2 days, then,  Take 3 tablets daily for 2 days, then  Take 2 tablets daily for 2 days,  then  Take 1 tablet daily for 1 day and then stop  t      tiotropium 18 MCG inhalation capsule   Commonly known as: SPIRIVA   Place 1 capsule (18 mcg total) into inhaler and inhale daily.         BRIEF HPI:  See H&P, Labs, Consult and Test reports for all details in brief, patient is a 57 year old Caucasian female with history of COPD, known tobacco abuse, noncompliant with medications who presented to the ED complaining of shortness of breath. She claimed that several of her family members have been sick with similar symptoms.  CONSULTATIONS:   None  PERTINENT RADIOLOGIC STUDIES: Dg Chest 2 View  02/17/2012  *RADIOLOGY REPORT*  Clinical Data: Shortness of breath, cough, congestion.  Fever.  CHEST - 2 VIEW  Comparison: None  Findings: Heart and mediastinal contours are within normal limits. No focal opacities or effusions.  No acute bony abnormality.  IMPRESSION: No active cardiopulmonary disease.   Original Report Authenticated By: Charlett Nose, M.D.      PERTINENT LAB RESULTS: CBC: No results found for this basename: WBC:2,HGB:2,HCT:2,PLT:2 in the last 72 hours CMET CMP     Component Value Date/Time   NA 139 02/19/2012 0621   K 3.8  02/19/2012 0621   CL 100 02/19/2012 0621   CO2 30 02/19/2012 0621   GLUCOSE 113* 02/19/2012 0621   BUN 17 02/19/2012 0621   CREATININE 0.62 02/19/2012 0621   CALCIUM 8.7 02/19/2012 0621   PROT 7.3 02/17/2012 1646   ALBUMIN 3.6 02/17/2012 1646   AST 62* 02/17/2012 1646   ALT 103* 02/17/2012 1646   ALKPHOS 86 02/17/2012 1646   BILITOT 0.5 02/17/2012 1646   GFRNONAA >90 02/19/2012 0621   GFRAA >90 02/19/2012 0621    GFR Estimated Creatinine Clearance: 87 ml/min (by C-G formula based on Cr of 0.62). No results found for this basename: LIPASE:2,AMYLASE:2 in the last 72 hours No results found for this basename: CKTOTAL:3,CKMB:3,CKMBINDEX:3,TROPONINI:3 in the last 72 hours No components found with this basename: POCBNP:3 No results found  for this basename: DDIMER:2 in the last 72 hours No results found for this basename: HGBA1C:2 in the last 72 hours No results found for this basename: CHOL:2,HDL:2,LDLCALC:2,TRIG:2,CHOLHDL:2,LDLDIRECT:2 in the last 72 hours No results found for this basename: TSH,T4TOTAL,FREET3,T3FREE,THYROIDAB in the last 72 hours No results found for this basename: VITAMINB12:2,FOLATE:2,FERRITIN:2,TIBC:2,IRON:2,RETICCTPCT:2 in the last 72 hours Coags: No results found for this basename: PT:2,INR:2 in the last 72 hours Microbiology: Recent Results (from the past 240 hour(s))  URINE CULTURE     Status: Normal   Collection Time   02/17/12  6:30 PM      Component Value Range Status Comment   Specimen Description URINE, CLEAN CATCH   Final    Special Requests ADDED 0544 02/18/12   Final    Culture  Setup Time 02/18/2012 05:53   Final    Colony Count NO GROWTH   Final    Culture NO GROWTH   Final    Report Status 02/19/2012 FINAL   Final   CLOSTRIDIUM DIFFICILE BY PCR     Status: Normal   Collection Time   02/19/12  6:35 PM      Component Value Range Status Comment   C difficile by pcr NEGATIVE  NEGATIVE Final      BRIEF HOSPITAL COURSE:  Acute exacerbation of COPD - Patient has a known history of COPD, because of financial issues she has been noncompliant with her inhalers. On admission, she was placed on IV Solu-Medrol, and scheduled nebulized bronchodilators. She was also placed on empiric Levaquin. With these measures, she made some clinical improvement. I saw for the very first time today, during my rounds this morning, patient was requesting that she be discharged because she felt significantly better. I made her ambulate in the hallway without oxygen, she was slightly short of breath and did have a rhonchi on examination. O2 saturation right after ambulation was 93% on room air. I asked her to stay back for another 24 more hours for further optimization of her treatment. She agreed to consider it,  later on during the day upon reevaluation, she again requested that she be discharged home. She felt that she was doing better and was very comfortable going home. She claims that she has a boyfriend who can keep an eye on her, claims that her daughter-in-law is a trauma nurse. She is awake and alert, she is being discharged home at own request. I have explained to her the risk of further worsening of her COPD exacerbation leading to respiratory failure, which can lead to significant disability and even death. She is fully accepting of these risks and is agreeable to be discharged home. She will be discharged on a tapering dose  of steroids, and inhalers as noted above. Since she does not have any pneumonia evident on chest x-ray him a hold antibiotics will be discontinued at the time of discharge.   Tobacco abuse - She has been counseled extensively. She is requesting nicotine patches on discharge, that will be provided.  Diarrhea - This is almost resolved. Stool C. difficile is negative.   TODAY-DAY OF DISCHARGE:  Subjective:   Kana Reimann today has no headache,no chest abdominal pain,no new weakness tingling or numbness, feels much better wants to go home today.  Objective:   Blood pressure 132/78, pulse 78, temperature 98 F (36.7 C), temperature source Oral, resp. rate 18, height 5\' 6"  (1.676 m), weight 86.546 kg (190 lb 12.8 oz), SpO2 98.00%.  Intake/Output Summary (Last 24 hours) at 02/21/12 1534 Last data filed at 02/21/12 1300  Gross per 24 hour  Intake    600 ml  Output   1600 ml  Net  -1000 ml    Exam Awake Alert, Oriented *3, No new F.N deficits, Normal affect [place].AT,PERRAL Supple Neck,No JVD, No cervical lymphadenopathy appriciated.  Symmetrical Chest wall movement, Good air movement bilaterally, still with some scattered rhonchi RRR,No Gallops,Rubs or new Murmurs, No Parasternal Heave +ve B.Sounds, Abd Soft, Non tender, No organomegaly appriciated, No rebound  -guarding or rigidity. No Cyanosis, Clubbing or edema, No new Rash or bruise  DISCHARGE CONDITION: Stable  DISPOSITION: HOME  DISCHARGE INSTRUCTIONS:    Activity:  As tolerated   Diet recommendation: Regular Diet       Follow-up Information    Follow up with Elizabeth Palau, FNP. Schedule an appointment as soon as possible for a visit in 1 week.   Contact information:   [address] [place] [address] [phone]         Total Time spent on discharge equals 45 minutes.  SignedJeoffrey Massed 02/21/2012 3:34 PM

## 2012-02-21 NOTE — Progress Notes (Signed)
   CARE MANAGEMENT NOTE 02/21/2012  Patient:  Samantha Hinton, Samantha Hinton   Account Number:  [id number]  Date Initiated:  02/18/2012  Documentation initiated by:  Darlyne Russian  Subjective/Objective Assessment:   Patient admitted with URI and UTI     Action/Plan:   Progression of care and discharge planning   Anticipated DC Date:     Anticipated DC Plan:        DC Planning Services  CM consult      Choice offered to / List presented to:             Status of service:  In process, will continue to follow Medicare Important Message given?   (If response is "NO", the following Medicare IM given date fields will be blank) Date Medicare IM given:   Date Additional Medicare IM given:    Discharge Disposition:    Per UR Regulation:  Reviewed for med. necessity/level of care/duration of stay  If discussed at Long Length of Stay Meetings, dates discussed:    Comments:  02/21/2012  1200 Darlyne Russian RN, [place]  [phone] CM referral: medication assistance  Met with patient to discuss medication assistance. NCM provided patient with medication assistance programs and discussed $4 meds at [facility]

## 2012-02-21 NOTE — Progress Notes (Signed)
PATIENT DETAILS Name: Samantha Hinton Age: 57 y.o. Sex: female Date of Birth: Jun 17, 1955 Admit Date: 02/17/2012 Admitting Physician Ron Parker, MD ZOX:[id number], FNP  Subjective: Still wheezing- but feels better than yesterday-anxious to go home  Assessment/Plan: Principal Problem: COPD with acute exacerbation - Better compared to yesterday, patient requesting to go home - With some wheezing on exam - Continue with nebulized bronchodilators, continue with prednisone and empiric levofloxacin - Ambulated patient in the hallway, O2 sat 93% on room air after ambulation- hence does not qualify for home oxygen  Active Problems: URI -? Viral - Continue with supportive care  UTI - On empiric Levaquin - Cultures negative    Tobacco abuse - Counseled extensively  Diarrhea - Almost resolved- but significantly better - Stool C. difficile PCR negative  Disposition: Remain inpatient  DVT Prophylaxis: Prophylactic Lovenox   Code Status: Full code   Procedures:  None  CONSULTS:  None  PHYSICAL EXAM: Vital signs in last 24 hours: Filed Vitals:   02/21/12 0518 02/21/12 0905 02/21/12 1355 02/21/12 1400  BP: 153/77 146/83  132/78  Pulse: 77 87  78  Temp: 98 F (36.7 C) 98.2 F (36.8 C)  98 F (36.7 C)  TempSrc: Oral Oral  Oral  Resp: 17 18  18   Height:      Weight:      SpO2: 94% 96% 99% 98%    Weight change: -0.091 kg (-3.2 oz) Body mass index is 30.80 kg/(m^2).   Gen Exam: Awake and alert with clear speech.   Neck: Supple, No JVD.   Chest: Still with wheezing, but good entry bilateral CVS: S1 S2 Regular, no murmurs.  Abdomen: soft, BS +, non tender, non distended.  Extremities: no edema, lower extremities warm to touch. Neurologic: Non Focal.  Skin: No Rash.   Wounds: N/A.    Intake/Output from previous day:  Intake/Output Summary (Last 24 hours) at 02/21/12 1503 Last data filed at 02/21/12 1300  Gross per 24 hour  Intake    600 ml    Output   1600 ml  Net  -1000 ml     LAB RESULTS: CBC  Lab 02/18/12 0730 02/17/12 1646  WBC 6.3 6.0  HGB 13.0 12.8  HCT 39.3 39.6  PLT 172 161  MCV 91.8 92.1  MCH 30.4 29.8  MCHC 33.1 32.3  RDW 12.9 12.8  LYMPHSABS -- 0.5*  MONOABS -- 0.6  EOSABS -- 0.0  BASOSABS -- 0.0  BANDABS -- --    Chemistries   Lab 02/19/12 0621 02/18/12 0730 02/17/12 1646  NA 139 139 137  K 3.8 3.4* 3.6  CL 100 99 98  CO2 30 27 25   GLUCOSE 113* 149* 106*  BUN 17 11 10   CREATININE 0.62 0.59 0.62  CALCIUM 8.7 8.8 9.1  MG -- -- --    CBG: No results found for this basename: GLUCAP:5 in the last 168 hours  GFR Estimated Creatinine Clearance: 87 ml/min (by C-G formula based on Cr of 0.62).  Coagulation profile No results found for this basename: INR:5,PROTIME:5 in the last 168 hours  Cardiac Enzymes No results found for this basename: CK:3,CKMB:3,TROPONINI:3,MYOGLOBIN:3 in the last 168 hours  No components found with this basename: POCBNP:3 No results found for this basename: DDIMER:2 in the last 72 hours No results found for this basename: HGBA1C:2 in the last 72 hours No results found for this basename: CHOL:2,HDL:2,LDLCALC:2,TRIG:2,CHOLHDL:2,LDLDIRECT:2 in the last 72 hours No results found for this basename: TSH,T4TOTAL,FREET3,T3FREE,THYROIDAB in the last 72 hours  No results found for this basename: VITAMINB12:2,FOLATE:2,FERRITIN:2,TIBC:2,IRON:2,RETICCTPCT:2 in the last 72 hours No results found for this basename: LIPASE:2,AMYLASE:2 in the last 72 hours  Urine Studies No results found for this basename: UACOL:2,UAPR:2,USPG:2,UPH:2,UTP:2,UGL:2,UKET:2,UBIL:2,UHGB:2,UNIT:2,UROB:2,ULEU:2,UEPI:2,UWBC:2,URBC:2,UBAC:2,CAST:2,CRYS:2,UCOM:2,BILUA:2 in the last 72 hours  MICROBIOLOGY: Recent Results (from the past 240 hour(s))  URINE CULTURE     Status: Normal   Collection Time   02/17/12  6:30 PM      Component Value Range Status Comment   Specimen Description URINE, CLEAN CATCH    Final    Special Requests ADDED 0544 02/18/12   Final    Culture  Setup Time 02/18/2012 05:53   Final    Colony Count NO GROWTH   Final    Culture NO GROWTH   Final    Report Status 02/19/2012 FINAL   Final   CLOSTRIDIUM DIFFICILE BY PCR     Status: Normal   Collection Time   02/19/12  6:35 PM      Component Value Range Status Comment   C difficile by pcr NEGATIVE  NEGATIVE Final     RADIOLOGY STUDIES/RESULTS: Dg Chest 2 View  02/17/2012  *RADIOLOGY REPORT*  Clinical Data: Shortness of breath, cough, congestion.  Fever.  CHEST - 2 VIEW  Comparison: None  Findings: Heart and mediastinal contours are within normal limits. No focal opacities or effusions.  No acute bony abnormality.  IMPRESSION: No active cardiopulmonary disease.   Original Report Authenticated By: Charlett Nose, M.D.     MEDICATIONS: Scheduled Meds:   . albuterol  2.5 mg Nebulization Q6H  . enoxaparin (LOVENOX) injection  40 mg Subcutaneous Q24H  . guaiFENesin  1,200 mg Oral BID  . ipratropium  0.5 mg Nebulization QID  . levofloxacin  750 mg Oral Daily  . nicotine  21 mg Transdermal Daily  . predniSONE  40 mg Oral Q breakfast  . sodium chloride  3 mL Intravenous Q12H   Continuous Infusions:   . sodium chloride 20 mL/hr at 02/19/12 0411   PRN Meds:.acetaminophen, acetaminophen, alum & mag hydroxide-simeth, hydrALAZINE, ondansetron (ZOFRAN) IV, ondansetron, oxyCODONE, zolpidem  Antibiotics: Anti-infectives     Start     Dose/Rate Route Frequency Ordered Stop   02/20/12 1500   levofloxacin (LEVAQUIN) tablet 750 mg        750 mg Oral Daily 02/20/12 1406     02/19/12 1200   azithromycin (ZITHROMAX) 500 mg in dextrose 5 % 250 mL IVPB  Status:  Discontinued        500 mg 250 mL/hr over 60 Minutes Intravenous Every 24 hours 02/19/12 1017 02/20/12 1406   02/18/12 1100   cefTRIAXone (ROCEPHIN) 1 g in dextrose 5 % 50 mL IVPB  Status:  Discontinued        1 g 100 mL/hr over 30 Minutes Intravenous Every 24 hours  02/18/12 1046 02/18/12 1050   02/18/12 0800   oseltamivir (TAMIFLU) capsule 75 mg  Status:  Discontinued        75 mg Oral 2 times daily 02/18/12 0717 02/18/12 1142   02/17/12 2100   cefTRIAXone (ROCEPHIN) 1 g in dextrose 5 % 50 mL IVPB  Status:  Discontinued        1 g 100 mL/hr over 30 Minutes Intravenous Every 24 hours 02/17/12 2051 02/19/12 1017   02/17/12 1915   cephALEXin (KEFLEX) capsule 500 mg        500 mg Oral  Once 02/17/12 1903 02/17/12 1923           Jeoffrey Massed, MD  [facility] Pager:[phone]  If 7PM-7AM, please contact night-coverage [email or web address] Password [email or web address] 02/21/2012, 3:03 PM   LOS: 4 days

## 2012-02-24 ENCOUNTER — Telehealth (HOSPITAL_COMMUNITY): Payer: Self-pay | Admitting: Internal Medicine

## 2012-02-24 NOTE — ED Notes (Signed)
Patient called to make an appointment for adult clinic.  Patient made aware they will be in tomorrow at 10am and phone number was given

## 2012-03-19 ENCOUNTER — Emergency Department (HOSPITAL_COMMUNITY)
Admission: EM | Admit: 2012-03-19 | Discharge: 2012-03-19 | Disposition: A | Discharge status: Home / Self Care | Payer: Medicare Other | Source: Home / Self Care

## 2012-03-19 ENCOUNTER — Encounter (HOSPITAL_COMMUNITY): Payer: Self-pay

## 2012-03-19 DIAGNOSIS — N390 Urinary tract infection, site not specified: Secondary | ICD-10-CM

## 2012-03-19 DIAGNOSIS — J441 Chronic obstructive pulmonary disease with (acute) exacerbation: Secondary | ICD-10-CM

## 2012-03-19 MED ORDER — INFLUENZA VIRUS VACC SPLIT PF IM SUSP
0.5000 mL | Freq: Once | INTRAMUSCULAR | Status: AC
  Administered 2012-03-19: 0.5 mL via INTRAMUSCULAR

## 2012-03-19 MED ORDER — IPRATROPIUM BROMIDE 0.02 % IN SOLN: 500.0000 ug | Freq: Four times a day (QID) | RESPIRATORY_TRACT | Status: DC | PRN

## 2012-03-19 MED ORDER — ALBUTEROL SULFATE HFA 108 (90 BASE) MCG/ACT IN AERS: 2.0000 | INHALATION_SPRAY | RESPIRATORY_TRACT | Status: DC | PRN

## 2012-03-19 MED ORDER — LISINOPRIL-HYDROCHLOROTHIAZIDE 10-12.5 MG PO TABS: 1.0000 | ORAL_TABLET | Freq: Every day | ORAL | Status: DC

## 2012-03-19 MED ORDER — NICOTINE 21 MG/24HR TD PT24: 1.0000 | MEDICATED_PATCH | Freq: Every day | TRANSDERMAL | Status: DC

## 2012-03-19 NOTE — ED Notes (Signed)
Patient was recently in hopsital for COPD, uri, uti- here today for a follow up

## 2012-03-19 NOTE — ED Provider Notes (Signed)
History    CSN: [id number]  Arrival date & time 03/19/12  1529   Chief Complaint  Patient presents with  . Follow-up    (Consider location/radiation/quality/duration/timing/severity/associated sxs/prior treatment) The history is provided by the patient.   Patient is presenting today to followup from recent hospitalization for COPD exacerbation.  She reports that she was smoking and had been smoking for over 40 years but she quit upon leaving the hospital.  She has not restarted smoking.  She continues to use the nicotine patches and that seems to help.  She reports that she needs more prescription for nicotine patches.  She reports that her breathing is much better.  She is having a very difficult time being able to afford this Symbicort medication and some of the other medications like Spiriva because of cost.  She does not have prescription benefits at this time.  The patient reports that she has been using her albuterol rescue inhaler and she has been using a sample of Symbicort that was given to her upon discharge from the hospital.  She also has been using albuterol nebulizer treatments as needed.  She reports that her shortness of breath has resolved.  She denies any chest pain.  The patient reports that she has not been able to followup with a pulmonologist.  She reports that she was recommended to do so at discharge.  She would like some assistance with setting up that appointment.  Past Medical History  Diagnosis Date  . COPD (chronic obstructive pulmonary disease)   . Hypertension     Past Surgical History  Procedure Date  . Abdominal hysterectomy     Family History  Problem Relation Age of Onset  . CAD Father   . CAD Sister   . Hypertension Father   . Hypertension Mother   . Cancer - Other Mother   . Diabetes Maternal Grandmother     History  Substance Use Topics  . Smoking status: Current Every Day Smoker  . Smokeless tobacco: Not on file  . Alcohol Use: Yes     OB History    Grav Para Term Preterm Abortions TAB SAB Ect Mult Living                 Review of Systems  HENT: Positive for congestion, rhinorrhea and postnasal drip.   Respiratory: Positive for cough and wheezing.   All other systems reviewed and are negative.   Allergies  Wellbutrin  Home Medications   Current Outpatient Rx  Name  Route  Sig  Dispense  Refill  . ALBUTEROL SULFATE HFA 108 (90 BASE) MCG/ACT IN AERS   Inhalation   Inhale 2 puffs into the lungs every 4 (four) hours as needed for wheezing or shortness of breath.   1 Inhaler   0   . ALBUTEROL SULFATE (2.5 MG/3ML) 0.083% IN NEBU   Nebulization   Take 3 mLs (2.5 mg total) by nebulization every 6 (six) hours as needed for wheezing.   75 mL   12   . BUDESONIDE-FORMOTEROL FUMARATE 80-4.5 MCG/ACT IN AERO   Inhalation   Inhale 2 puffs into the lungs 2 (two) times daily.   1 Inhaler   0   . NICOTINE 21 MG/24HR TD PT24   Transdermal   Place 1 patch onto the skin daily.   28 patch   0   . PREDNISONE 10 MG PO TABS      Take 4 tablets daily for 2 days, then, Take 3 tablets  daily for 2 days, then Take 2 tablets daily for 2 days, then Take 1 tablet daily for 1 day and then stop t   19 tablet   0   . TIOTROPIUM BROMIDE MONOHYDRATE 18 MCG IN CAPS   Inhalation   Place 1 capsule (18 mcg total) into inhaler and inhale daily.   30 capsule   0    BP 159/97  Pulse 111  Temp 98.3 F (36.8 C) (Oral)  Resp 18  SpO2 99%  Physical Exam  Nursing note and vitals reviewed. Constitutional: She is oriented to person, place, and time. She appears well-developed and well-nourished. No distress.  HENT:  Head: Normocephalic and atraumatic.  Eyes: Conjunctivae normal and EOM are normal. Pupils are equal, round, and reactive to light.  Neck: Normal range of motion. Neck supple. No JVD present.  Cardiovascular: Normal rate, regular rhythm and normal heart sounds.   Pulmonary/Chest: Effort normal. No respiratory  distress. She has wheezes. She has no rales. She exhibits no tenderness.       Rare expiratory wheezes heard at bilateral bases  Abdominal: Soft. Bowel sounds are normal.  Musculoskeletal: Normal range of motion.  Neurological: She is alert and oriented to person, place, and time. She has normal reflexes.  Skin: Skin is warm and dry. No rash noted. No erythema. No pallor.  Psychiatric: She has a normal mood and affect. Her behavior is normal. Judgment and thought content normal.    ED Course  Procedures (including critical care time)  Labs Reviewed - No data to display No results found.  No diagnosis found.  MDM  IMPRESSION  Hypertension  COPD STATUS post recent hospitalization  Hospital follow up for COPD Exacerbation  Long history of chronic active nicotine dependence-recently quit  RECOMMENDATIONS / PLAN  Pulmonary referral initiated today Influenza vaccine provided Lisinopril HCTZ 10/12.5 - 1 po daily -we'll start this for blood pressure treatment. Asked patient to check her blood pressure at home 1-2 times daily, write down the numbers and report them to our office in 2 weeks Encouraged continued tobacco cessation, refilled nicotine patches  FOLLOW UP 1 month  The patient was given clear instructions to go to ER or return to medical center if symptoms don't improve, worsen or new problems develop.  The patient verbalized understanding.  The patient was told to call to get lab results if they haven't heard anything in the next week.            Cleora Fleet, MD 03/19/12 [phone]

## 2012-03-19 NOTE — ED Notes (Signed)
PT HAS AN APPOINTMENT with [facility] 04/04/12 10:45 am with Dr, Casimiro Needle west-pulmonology

## 2012-04-04 ENCOUNTER — Institutional Professional Consult (permissible substitution): Payer: Medicare Other | Admitting: Internal Medicine

## 2012-04-10 ENCOUNTER — Encounter: Payer: Self-pay | Admitting: Internal Medicine

## 2012-04-10 ENCOUNTER — Ambulatory Visit (INDEPENDENT_AMBULATORY_CARE_PROVIDER_SITE_OTHER): Payer: Medicare Other | Admitting: Internal Medicine

## 2012-04-10 VITALS — BP 140/86 | HR 109 | Temp 98.2°F | Ht 66.0 in | Wt 194.6 lb

## 2012-04-10 DIAGNOSIS — J449 Chronic obstructive pulmonary disease, unspecified: Secondary | ICD-10-CM

## 2012-04-10 DIAGNOSIS — I10 Essential (primary) hypertension: Secondary | ICD-10-CM

## 2012-04-10 MED ORDER — MOMETASONE FURO-FORMOTEROL FUM 100-5 MCG/ACT IN AERO: INHALATION_SPRAY | RESPIRATORY_TRACT | Status: DC

## 2012-04-10 MED ORDER — NEBIVOLOL HCL 5 MG PO TABS: 5.0000 mg | ORAL_TABLET | Freq: Every day | ORAL | Status: DC

## 2012-04-10 NOTE — Progress Notes (Signed)
  Subjective:    Patient ID: Samantha Hinton, female    DOB: 11-Dec-1955 MRN: [id number]  HPI  53 yowf quit smoking 01/2012 with baseline doe x > extensive shopping s 02 better with albuterol when had access to it then admitted [facility]:   Admit Date: 02/17/2012  Admitting Physician: Ron Parker, MD  [id number], FNP  Recommendations for Outpatient Follow-up:  1. Please optimize COPD medication 2. Gen. health maintenance 3. Continue tobacco cessation counseling PRIMARY DISCHARGE DIAGNOSIS:  Principal Problem:  *URI (upper respiratory infection)  Active Problems:  COPD exacerbation  SOB (shortness of breath)  Hypoxemia  UTI (lower urinary tract infection)  Diarrhea  Tobacco abuse    04/10/2012 1st pulmonary ov  On ACEI cc doe x 5 steps on neb and albuterol 7 x daily with persistent sense of chest tightness no better sicne discharge  No obvious daytime variabilty or assoc chronic cough or cp or chest tightness, subjective wheeze overt sinus or hb symptoms. No unusual exp hx or h/o childhood pna/ asthma or premature birth to her knowledge.   Sleeping ok without nocturnal  or early am exacerbation  of respiratory  c/o's or need for noct saba. Also denies any obvious fluctuation of symptoms with weather or environmental changes or other aggravating or alleviating factors except as outlined above   Review of Systems  Constitutional: Positive for unexpected weight change. Negative for fever.  HENT: Negative for ear pain, nosebleeds, congestion, sore throat, rhinorrhea, sneezing, trouble swallowing, dental problem, postnasal drip and sinus pressure.   Eyes: Negative for redness and itching.  Respiratory: Positive for chest tightness, shortness of breath and wheezing. Negative for cough.   Cardiovascular: Positive for palpitations. Negative for leg swelling.  Gastrointestinal: Positive for abdominal pain. Negative for nausea and vomiting.  Genitourinary: Negative for dysuria.   Musculoskeletal: Negative for joint swelling.  Skin: Negative for rash.  Neurological: Positive for dizziness and headaches.  Hematological: Does not bruise/bleed easily.  Psychiatric/Behavioral: Negative for dysphoric mood. The patient is not nervous/anxious.        Objective:   Physical Exam  Wt Readings from Last 3 Encounters:  04/10/12 194 lb 9.6 oz (88.27 kg)  02/20/12 190 lb 12.8 oz (86.546 kg)     HEENT mild turbinate edema.  Oropharynx no thrush or excess pnd or cobblestoning.  No JVD or cervical adenopathy. Mild accessory muscle hypertrophy. Trachea midline, nl thryroid. Chest was hyperinflated by percussion with diminished breath sounds and moderate increased exp time without wheeze. Hoover sign positive at mid inspiration. Regular rate and rhythm without murmur gallop or rub or increase P2 or edema.  Abd: no hsm, nl excursion. Ext warm without cyanosis or clubbing.    cxr 02/17/12 No active cardiopulmonary disease.     Assessment & Plan:

## 2012-04-10 NOTE — Patient Instructions (Addendum)
Stop lisinopril and start bystolic 5 mg one daily Start dulera 100 Take 2 puffs first thing in am and then another 2 puffs about 12 hours later.    Plan B  Only use your albuterol inhaler, plan C is your nebulizer) as a rescue medication to be used if you can't catch your breath by resting or doing a relaxed purse lip breathing pattern. The less you use it, the better it will work when you need it.   GERD (REFLUX)  is an extremely common cause of respiratory symptoms (like cough and wheeze), many times with no significant heartburn at all.    It can be treated with medication, but also with lifestyle changes including avoidance of late meals, excessive alcohol, smoking cessation, and avoid fatty foods, chocolate, peppermint, colas, red wine, and acidic juices such as orange juice.  NO MINT OR MENTHOL PRODUCTS SO NO COUGH DROPS  USE SUGARLESS CANDY INSTEAD (jolley ranchers or Stover's)  NO OIL BASED VITAMINS - use powdered substitutes.  Please schedule a follow up office visit in 4 weeks, sooner if needed

## 2012-04-12 DIAGNOSIS — J449 Chronic obstructive pulmonary disease, unspecified: Secondary | ICD-10-CM | POA: Insufficient documentation

## 2012-04-12 DIAGNOSIS — I10 Essential (primary) hypertension: Secondary | ICD-10-CM | POA: Insufficient documentation

## 2012-04-12 NOTE — Assessment & Plan Note (Signed)
Symptoms are markedly disproportionate to objective findings and not clear this is a lung problem but pt does appear to have difficult airway management issues. DDX of  difficult airways managment all start with A and  include Adherence, Ace Inhibitors, Acid Reflux, Active Sinus Disease, Alpha 1 Antitripsin deficiency, Anxiety masquerading as Airways dz,  ABPA,  allergy(esp in young), Aspiration (esp in elderly), Adverse effects of DPI,  Active smokers, plus two Bs  = Bronchiectasis and Beta blocker use..and one C= CHF  Adherence is always the initial "prime suspect" and is a multilayered concern that requires a "trust but verify" approach in every patient - starting with knowing how to use medications, especially inhalers, correctly, keeping up with refills and understanding the fundamental difference between maintenance and prns vs those medications only taken for a very short course and then stopped and not refilled. The proper method of use, as well as anticipated side effects, of a metered-dose inhaler are discussed and demonstrated to the patient. Improved effectiveness after extensive coaching during this visit to a level of approximately  75% so try dulera 100 2bid until can return for pft's  ? acei effect > change to bystolic  See instructions for specific recommendations which were reviewed directly with the patient who was given a copy with highlighter outlining the key components.

## 2012-04-12 NOTE — Assessment & Plan Note (Signed)
ACE inhibitors are problematic in  pts with airway complaints because  even experienced pulmonologists can't always distinguish ace effects from copd/asthma.  By themselves they don't actually cause a problem, much like oxygen can't by itself start a fire, but they certainly serve as a powerful catalyst or enhancer for any "fire"  or inflammatory process in the upper airway, be it caused by an ET  tube or more commonly reflux (especially in the obese or pts with known GERD or who are on biphoshonates).    for now Strongly prefer in this setting: Bystolic, the most beta -1  selective Beta blocker available in sample form, with bisoprolol the most selective generic choice  on the market.   rec bystolic 5 mg daily

## 2012-05-08 ENCOUNTER — Ambulatory Visit (INDEPENDENT_AMBULATORY_CARE_PROVIDER_SITE_OTHER): Payer: Medicare Other | Admitting: Internal Medicine

## 2012-05-08 ENCOUNTER — Encounter: Payer: Self-pay | Admitting: Internal Medicine

## 2012-05-08 VITALS — BP 128/78 | HR 91 | Temp 97.3°F | Ht 66.0 in | Wt 195.4 lb

## 2012-05-08 DIAGNOSIS — I10 Essential (primary) hypertension: Secondary | ICD-10-CM

## 2012-05-08 DIAGNOSIS — J449 Chronic obstructive pulmonary disease, unspecified: Secondary | ICD-10-CM

## 2012-05-08 MED ORDER — MOMETASONE FURO-FORMOTEROL FUM 200-5 MCG/ACT IN AERO: INHALATION_SPRAY | RESPIRATORY_TRACT | Status: DC

## 2012-05-08 MED ORDER — BISOPROLOL FUMARATE 5 MG PO TABS: 5.0000 mg | ORAL_TABLET | Freq: Every day | ORAL | Status: DC

## 2012-05-08 NOTE — Progress Notes (Signed)
  Subjective:    Patient ID: Samantha Hinton, female    DOB: 1955/07/19 MRN: [id number]  HPI  71 yowf quit smoking 01/2012 with baseline doe x > extensive shopping s 02 better with albuterol when had access to it then admitted [facility]:   Admit Date: 02/17/2012  Admitting Physician: Ron Parker, MD  WUJ:[id number], FNP  Recommendations for Outpatient Follow-up:  1. Please optimize COPD medication 2. Gen. health maintenance 3. Continue tobacco cessation counseling PRIMARY DISCHARGE DIAGNOSIS:  Principal Problem:  *URI (upper respiratory infection)  Active Problems:  COPD exacerbation  SOB (shortness of breath)  Hypoxemia  UTI (lower urinary tract infection)  Diarrhea  Tobacco abuse    04/10/2012 1st pulmonary ov  On ACEI cc doe x 5 steps on neb and albuterol 7 x daily with persistent sense of chest tightness no better since discharge rec Stop lisinopril and start bystolic 5 mg one daily Start dulera 100 Take 2 puffs first thing in am and then another 2 puffs about 12 hours later.  Plan B  Only use your albuterol inhaler, plan C is your nebulizer) as a rescue medication   GERD diet   05/08/2012 f/u ov/Samantha Hinton cc breathing much better on dulera 200 and bystolic no need for saba or neb despite head and chest cold since last ov.  No obvious daytime variabilty or assoc chronic cough or cp or chest tightness, subjective wheeze overt sinus or hb symptoms. No unusual exp hx or h/o childhood pna/ asthma or premature birth to her knowledge.   Sleeping ok without nocturnal  or early am exacerbation  of respiratory  c/o's or need for noct saba. Also denies any obvious fluctuation of symptoms with weather or environmental changes or other aggravating or alleviating factors except as outlined above   ROS  The following are not active complaints unless bolded sore throat, dysphagia, dental problems, itching, sneezing,  nasal congestion or excess/ purulent secretions, ear ache,   fever,  chills, sweats, unintended wt loss, pleuritic or exertional cp, hemoptysis,  orthopnea pnd or leg swelling, presyncope, palpitations, heartburn, abdominal pain, anorexia, nausea, vomiting, diarrhea  or change in bowel or urinary habits, change in stools or urine, dysuria,hematuria,  rash, arthralgias, visual complaints, headache, numbness weakness or ataxia or problems with walking or coordination,  change in mood/affect or memory.           Objective:   Physical Exam  05/08/2012  Wt 195  Wt Readings from Last 3 Encounters:  04/10/12 194 lb 9.6 oz (88.27 kg)  02/20/12 190 lb 12.8 oz (86.546 kg)     HEENT mild turbinate edema.  Oropharynx no thrush or excess pnd or cobblestoning.  No JVD or cervical adenopathy. Mild accessory muscle hypertrophy. Trachea midline, nl thryroid. Chest was hyperinflated by percussion with diminished breath sounds and moderate increased exp time without wheeze. Hoover sign positive at mid inspiration. Regular rate and rhythm without murmur gallop or rub or increase P2 or edema.  Abd: no hsm, nl excursion. Ext warm without cyanosis or clubbing.    cxr 02/17/12 No active cardiopulmonary disease.     Assessment & Plan:

## 2012-05-08 NOTE — Assessment & Plan Note (Addendum)
The proper method of use, as well as anticipated side effects, of a metered-dose inhaler are discussed and demonstrated to the patient. Improved effectiveness after extensive coaching during this visit to a level of approximately  90%  Marked improvement in symptoms off acei and on a more specific beta blocker.    Each maintenance medication was reviewed in detail including most importantly the difference between maintenance and as needed and under what circumstances the prns are to be used.  Please see instructions for details which were reviewed in writing and the patient given a copy.    Needs pft's next

## 2012-05-08 NOTE — Patient Instructions (Addendum)
Start bisoprolol 5 mg daily in place of bystolic - break in half daily if too strong  dulera 200 Take 2 puffs first thing in am and then another 2 puffs about 12 hours later.    Please schedule a follow up office visit in 4 weeks, sooner if needed with pfts

## 2012-05-10 NOTE — Assessment & Plan Note (Addendum)
Changed from acei to bystolic 04/10/12> much better  Strongly prefer in this setting: Bystolic, the most beta -1  selective Beta blocker available in sample form, with bisoprolol the most selective generic choice  on the market.  She needs a generic so will try bisoprolol  See instructions for specific recommendations which were reviewed directly with the patient who was given a copy with highlighter outlining the key components.

## 2012-06-12 ENCOUNTER — Ambulatory Visit (INDEPENDENT_AMBULATORY_CARE_PROVIDER_SITE_OTHER): Payer: Medicare Other | Admitting: Internal Medicine

## 2012-06-12 ENCOUNTER — Encounter: Payer: Self-pay | Admitting: Internal Medicine

## 2012-06-12 VITALS — BP 132/84 | HR 105 | Temp 99.1°F | Ht 64.0 in | Wt 199.0 lb

## 2012-06-12 DIAGNOSIS — J449 Chronic obstructive pulmonary disease, unspecified: Secondary | ICD-10-CM

## 2012-06-12 LAB — PULMONARY FUNCTION TEST

## 2012-06-12 MED ORDER — PREDNISONE (PAK) 10 MG PO TABS: ORAL_TABLET | ORAL | Status: DC

## 2012-06-12 NOTE — Progress Notes (Signed)
PFT done today. 

## 2012-06-12 NOTE — Patient Instructions (Addendum)
Zyrtec should be used one daily as needed for itching sneezing runny nose  Blow out through through the nose  Please see patient coordinator before you leave today  to schedule pulmonary rehab  Prednisone 10 mg take  4 each am x 2 days,   2 each am x 2 days,  1 each am x2days and stop   Please schedule a follow up office visit in 6 weeks, call sooner if needed

## 2012-06-12 NOTE — Progress Notes (Signed)
Subjective:    Patient ID: Samantha Hinton, female    DOB: 09-21-1955 MRN: [id number]  HPI  39 yowf quit smoking 01/2012 with baseline doe x > extensive shopping s 02 better with albuterol when had access to it then admitted [facility]:   Admit Date: 02/17/2012  Admitting Physician: Ron Parker, MD  [id number], FNP  Recommendations for Outpatient Follow-up:  1. Please optimize COPD medication 2. Gen. health maintenance 3. Continue tobacco cessation counseling PRIMARY DISCHARGE DIAGNOSIS:  Principal Problem:  *URI (upper respiratory infection)  Active Problems:  COPD exacerbation  SOB (shortness of breath)  Hypoxemia  UTI (lower urinary tract infection)  Diarrhea  Tobacco abuse    04/10/2012 1st pulmonary ov  On ACEI cc doe x 5 steps on neb and albuterol 7 x daily with persistent sense of chest tightness no better since discharge rec Stop lisinopril and start bystolic 5 mg one daily Start dulera 100 Take 2 puffs first thing in am and then another 2 puffs about 12 hours later.  Plan B  Only use your albuterol inhaler, plan C is your nebulizer) as a rescue medication   GERD diet   05/08/2012 f/u ov/Samantha Hinton cc breathing much better on dulera 200 and bystolic no need for saba or neb despite head and chest cold since last ov. rec Start bisoprolol 5 mg daily in place of bystolic - break in half daily if too strong dulera 200 Take 2 puffs first thing in am and then another 2 puffs about 12 hours later.  06/12/2012 f/u ov/Samantha Hinton re copd Chief Complaint  Patient presents with  . Follow-up    Breathing some worse for the past 2 wks, relates to allergy season. Cough has improve some.    takes zyrtec year round, very nervous using saba 1-2 x daily but typically not at rest or hs.  Minimal impact on activity tolerance though.  No obvious daytime variabilty or assoc chronic   cp or chest tightness, subjective wheeze overt sinus or hb symptoms. No unusual exp hx or h/o childhood pna/  asthma or premature birth to her knowledge.   Sleeping ok without nocturnal  or early am exacerbation  of respiratory  c/o's or need for noct saba. Also denies any obvious fluctuation of symptoms with weather or environmental changes or other aggravating or alleviating factors except as outlined above   ROS  The following are not active complaints unless bolded sore throat, dysphagia, dental problems, itching, sneezing,  nasal congestion or excess/ purulent secretions, ear ache,   fever, chills, sweats, unintended wt loss, pleuritic or exertional cp, hemoptysis,  orthopnea pnd or leg swelling, presyncope, palpitations, heartburn, abdominal pain, anorexia, nausea, vomiting, diarrhea  or change in bowel or urinary habits, change in stools or urine, dysuria,hematuria,  rash, arthralgias, visual complaints, headache, numbness weakness or ataxia or problems with walking or coordination,  change in mood/affect or memory.           Objective:   Physical Exam  05/08/2012  Wt 195  > 199 06/12/2012  Wt Readings from Last 3 Encounters:  04/10/12 194 lb 9.6 oz (88.27 kg)  02/20/12 190 lb 12.8 oz (86.546 kg)     HEENT mild turbinate edema.  Oropharynx no thrush or excess pnd or cobblestoning.  No JVD or cervical adenopathy. Mild accessory muscle hypertrophy. Trachea midline, nl thryroid. Chest was hyperinflated by percussion with diminished breath sounds and moderate increased exp time without wheeze. Hoover sign positive at mid inspiration. Regular rate and rhythm without  murmur gallop or rub or increase P2 or edema.  Abd: no hsm, nl excursion. Ext warm without cyanosis or clubbing.    cxr 02/17/12 No active cardiopulmonary disease.     Assessment & Plan:

## 2012-06-13 ENCOUNTER — Encounter: Payer: Self-pay | Admitting: Internal Medicine

## 2012-06-16 NOTE — Assessment & Plan Note (Signed)
-   HFA 75% p coaching 04/10/12> 90% p coaching 05/08/2012  - PFT's 06/12/2012 FEV1  1.41 (60%) ratio 64 and no better p B2  And DLCO 51 corrects to 94 % - 06/12/2012 refer to Rehab  DDX of  difficult airways managment all start with A and  include Adherence, Ace Inhibitors, Acid Reflux, Active Sinus Disease, Alpha 1 Antitripsin deficiency, Anxiety masquerading as Airways dz,  ABPA,  allergy(esp in young), Aspiration (esp in elderly), Adverse effects of DPI,  Active smokers, plus two Bs  = Bronchiectasis and Beta blocker use..and one C= CHF  Adherence is always the initial "prime suspect" and is a multilayered concern that requires a "trust but verify" approach in every patient - starting with knowing how to use medications, especially inhalers, correctly, keeping up with refills and understanding the fundamental difference between maintenance and prns vs those medications only taken for a very short course and then stopped and not refilled.   ? Allergy suggested by h/o worse rhinitis > Prednisone 10 mg take  4 each am x 2 days,   2 each am x 2 days,  1 each am x2days and stop and change to prn rather than daily zyrtec with option of adding flonase or dymsita.

## 2012-07-04 ENCOUNTER — Encounter: Payer: Self-pay | Admitting: Internal Medicine

## 2012-07-24 ENCOUNTER — Ambulatory Visit: Payer: Medicare Other | Admitting: Internal Medicine

## 2012-08-01 ENCOUNTER — Encounter: Payer: Self-pay | Admitting: Internal Medicine

## 2012-08-01 ENCOUNTER — Ambulatory Visit (INDEPENDENT_AMBULATORY_CARE_PROVIDER_SITE_OTHER): Payer: Medicare Other | Admitting: Internal Medicine

## 2012-08-01 VITALS — BP 142/86 | HR 100 | Temp 98.2°F | Ht 64.5 in | Wt 201.4 lb

## 2012-08-01 DIAGNOSIS — J449 Chronic obstructive pulmonary disease, unspecified: Secondary | ICD-10-CM

## 2012-08-01 DIAGNOSIS — F17200 Nicotine dependence, unspecified, uncomplicated: Secondary | ICD-10-CM

## 2012-08-01 MED ORDER — ACLIDINIUM BROMIDE 400 MCG/ACT IN AEPB: 1.0000 | INHALATION_SPRAY | Freq: Two times a day (BID) | RESPIRATORY_TRACT | Status: DC

## 2012-08-01 NOTE — Progress Notes (Signed)
Subjective:    Patient ID: Samantha Hinton, female    DOB: [birth year] MRN: [id number]  HPI  72 yowf smoker with GOLD II COPD  baseline doe x best days able to do extensive shopping s 02 better with albuterol when had access to it then admitted [facility]:   Admit Date: 02/17/2012  Admitting Physician: Ron Parker, MD  [id number], FNP  Recommendations for Outpatient Follow-up:  1. Please optimize COPD medication 2. Gen. health maintenance 3. Continue tobacco cessation counseling PRIMARY DISCHARGE DIAGNOSIS:  Principal Problem:  *URI (upper respiratory infection)  Active Problems:  COPD exacerbation  SOB (shortness of breath)  Hypoxemia  UTI (lower urinary tract infection)  Diarrhea  Tobacco abuse    04/10/2012 1st pulmonary ov  On ACEI cc doe x 5 steps on neb and albuterol 7 x daily with persistent sense of chest tightness no better since discharge rec Stop lisinopril and start bystolic 5 mg one daily Start dulera 100 Take 2 puffs first thing in am and then another 2 puffs about 12 hours later.  Plan B  Only use your albuterol inhaler, plan C is your nebulizer) as a rescue medication   GERD diet   05/08/2012 f/u ov/Ed Rayson cc breathing much better on dulera 200 and bystolic no need for saba or neb despite head and chest cold since last ov. rec Start bisoprolol 5 mg daily in place of bystolic - break in half daily if too strong dulera 200 Take 2 puffs first thing in am and then another 2 puffs about 12 hours later.  06/12/2012 f/u ov/Miyoko Hashimi re copd Chief Complaint  Patient presents with  . Follow-up    Breathing some worse for the past 2 wks, relates to allergy season. Cough has improve some.    takes zyrtec year round, very nervous using saba 1-2 x daily but typically not at rest or hs.  Minimal impact on activity tolerance though. rec Zyrtec should be used one daily as needed for itching sneezing runny nose Blow out through through the nose Please see patient  coordinator before you leave today  to schedule pulmonary rehab > placed on wt list  Prednisone 10 mg take  4 each am x 2 days,   2 each am x 2 days,  1 each am x2days and stop   08/01/2012 f/u ov/Giovoni Bunch still smoking Chief Complaint  Patient presents with  . Follow-up    Pt states that her breathing is unchanged since last visit. Cough is some better.   still using hfa albuterol before  And after dulera but  not needing nebulizer alb and not limited from desired activities - still on wt list for rehab. Not convinced dulera really helping her or reducing perceived need for saba and no real change since ran out x > 2 weeks prior to OV    No obvious daytime variabilty or assoc excess or purulent sputum or cp or chest tightness, subjective wheeze overt sinus or hb symptoms. No unusual exp hx or h/o childhood pna/ asthma or premature birth to her knowledge.   Sleeping ok without nocturnal  or early am exacerbation  of respiratory  c/o's or need for noct saba. Also denies any obvious fluctuation of symptoms with weather or environmental changes or other aggravating or alleviating factors except as outlined above   Current Medications, Allergies, Past Medical History, Past Surgical History, Family History, and Social History were reviewed in [employer] record.  ROS  The following are not active complaints  unless bolded sore throat, dysphagia, dental problems, itching, sneezing,  nasal congestion or excess/ purulent secretions, ear ache,   fever, chills, sweats, unintended wt loss, pleuritic or exertional cp, hemoptysis,  orthopnea pnd or leg swelling, presyncope, palpitations, heartburn, abdominal pain, anorexia, nausea, vomiting, diarrhea  or change in bowel or urinary habits, change in stools or urine, dysuria,hematuria,  rash, arthralgias, visual complaints, headache, numbness weakness or ataxia or problems with walking or coordination,  change in mood/affect or memory.                Objective:   Physical Exam  05/08/2012  Wt 195  > 199 06/12/2012 > 201 08/01/2012  Wt Readings from Last 3 Encounters:  04/10/12 194 lb 9.6 oz (88.27 kg)  02/20/12 190 lb 12.8 oz (86.546 kg)     HEENT mild turbinate edema.  Oropharynx no thrush or excess pnd or cobblestoning.  No JVD or cervical adenopathy. Mild accessory muscle hypertrophy. Trachea midline, nl thryroid. Chest was hyperinflated by percussion with diminished breath sounds and moderate increased exp time without wheeze. Hoover sign positive at mid inspiration. Regular rate and rhythm without murmur gallop or rub or increase P2 or edema.  Abd: no hsm, nl excursion. Ext warm without cyanosis or clubbing.    cxr 02/17/12 No active cardiopulmonary disease.     Assessment & Plan:

## 2012-08-01 NOTE — Patient Instructions (Addendum)
tudorza one twice daily on a trial basis  Only use your albuterol (ventolin 1st, nebulizer is second)  as a rescue medication to be used if you can't catch your breath by resting or doing a relaxed purse lip breathing pattern. The less you use it, the better it will work when you need it.   The key is to stop smoking completely before smoking completely stops you - it is not too late  Please schedule a follow up office visit in 4 weeks, sooner if needed

## 2012-08-02 NOTE — Assessment & Plan Note (Signed)
-   HFA 75% p coaching 04/10/12> 90% p coaching 05/08/2012  - PFT's 06/12/2012 FEV1  1.41 (60%) ratio 64 and no better p B2  And DLCO 51 corrects to 94 % - 06/12/2012 refer to Rehab    Overuse of albuterol noted and no real change on dulera so reasonable to give her trial of tudorza but explained the smoking is problem, not the choice of meds.    Each maintenance medication was reviewed in detail including most importantly the difference between maintenance and as needed and under what circumstances the prns are to be used.  Please see instructions for details which were reviewed in writing and the patient given a copy.

## 2012-08-02 NOTE — Assessment & Plan Note (Signed)
>   3 min  I reviewed the Flethcher curve with patient that basically indicates  if you quit smoking when your best day FEV1 is still well preserved 9as hers is) it is highly unlikely you will progress to severe disease and informed the patient there was no medication on the market that has proven to change the curve or the likelihood of progression.  Therefore stopping smoking and maintaining abstinence is the most important aspect of care, not choice of inhalers or for that matter, doctors.

## 2012-08-11 ENCOUNTER — Encounter: Payer: Self-pay | Admitting: Internal Medicine

## 2012-09-01 ENCOUNTER — Ambulatory Visit: Payer: Medicare Other | Admitting: Internal Medicine

## 2012-09-09 ENCOUNTER — Ambulatory Visit: Payer: Medicare Other | Admitting: Internal Medicine

## 2012-09-17 ENCOUNTER — Ambulatory Visit: Payer: Medicare Other | Admitting: Internal Medicine

## 2012-09-23 ENCOUNTER — Ambulatory Visit (INDEPENDENT_AMBULATORY_CARE_PROVIDER_SITE_OTHER): Payer: Medicare Other | Admitting: Internal Medicine

## 2012-09-23 ENCOUNTER — Encounter: Payer: Self-pay | Admitting: Internal Medicine

## 2012-09-23 VITALS — BP 112/82 | HR 117 | Temp 99.1°F | Ht 65.0 in | Wt 203.8 lb

## 2012-09-23 DIAGNOSIS — J449 Chronic obstructive pulmonary disease, unspecified: Secondary | ICD-10-CM

## 2012-09-23 DIAGNOSIS — J4489 Other specified chronic obstructive pulmonary disease: Secondary | ICD-10-CM

## 2012-09-23 DIAGNOSIS — F17200 Nicotine dependence, unspecified, uncomplicated: Secondary | ICD-10-CM

## 2012-09-23 MED ORDER — PREDNISONE (PAK) 10 MG PO TABS: ORAL_TABLET | ORAL | Status: DC

## 2012-09-23 MED ORDER — ACLIDINIUM BROMIDE 400 MCG/ACT IN AEPB: 1.0000 | INHALATION_SPRAY | Freq: Two times a day (BID) | RESPIRATORY_TRACT | Status: DC

## 2012-09-23 NOTE — Patient Instructions (Addendum)
Resume tudorza one twice daily   Only use your albuterol as a rescue medication to be used if you can't catch your breath by resting or doing a relaxed purse lip breathing pattern. The less you use it, the better it will work when you need it.   The key is to stop smoking completely before smoking completely stops you!   Prednisone 10 mg take  4 each am x 2 days,   2 each am x 2 days,  1 each am x 2 days and stop   For any flare of resp symptoms add ranitidine twice daily esp the bedtime dose

## 2012-09-23 NOTE — Progress Notes (Signed)
Subjective:    Patient ID: Samantha Hinton, female    DOB: [birth year] MRN: [id number]  HPI  89 yowf smoker with GOLD II COPD  baseline doe x best days able to do extensive shopping s 02 better with albuterol when had access to it then admitted [facility]:   Admit Date: 02/17/2012  PRIMARY DISCHARGE DIAGNOSIS:  URI (upper respiratory infection)  COPD exacerbation  SOB (shortness of breath)  Hypoxemia  UTI (lower urinary tract infection)  Diarrhea  Tobacco abuse    04/10/2012 1st pulmonary ov  On ACEI cc doe x 5 steps on neb and albuterol 7 x daily with persistent sense of chest tightness no better since discharge rec Stop lisinopril and start bystolic 5 mg one daily Start dulera 100 Take 2 puffs first thing in am and then another 2 puffs about 12 hours later.  Plan B  Only use your albuterol inhaler, plan C is your nebulizer) as a rescue medication   GERD diet   05/08/2012 f/u ov/Wert cc breathing much better on dulera 200 and bystolic no need for saba or neb despite head and chest cold since last ov. rec Start bisoprolol 5 mg daily in place of bystolic - break in half daily if too strong dulera 200 Take 2 puffs first thing in am and then another 2 puffs about 12 hours later.  06/12/2012 f/u ov/Wert re copd Chief Complaint  Patient presents with  . Follow-up    Breathing some worse for the past 2 wks, relates to allergy season. Cough has improve some.    takes zyrtec year round, very nervous using saba 1-2 x daily but typically not at rest or hs.  Minimal impact on activity tolerance though. rec Zyrtec should be used one daily as needed for itching sneezing runny nose Blow out through through the nose Please see patient coordinator before you leave today  to schedule pulmonary rehab > placed on wt list  Prednisone 10 mg take  4 each am x 2 days,   2 each am x 2 days,  1 each am x2days and stop   08/01/2012 f/u ov/Wert still smoking Chief Complaint  Patient presents with  .  Follow-up    Pt states that her breathing is unchanged since last visit. Cough is some better.   still using hfa albuterol before  And after dulera but  not needing nebulizer alb and not limited from desired activities - still on wt list for rehab. Not convinced dulera really helping her or reducing perceived need for saba and no real change since ran out x > 2 weeks prior to OV   rec tudorza one twice daily on a trial basis Only use your albuterol (ventolin 1st, nebulizer is second)    09/23/2012 f/u ov/Wert still smoking Chief Complaint  Patient presents with  . 4 wk follow up    Has ran out of [place].  Breathing improved while on this medicaiton but has noticed more wheezing now that she is off of it.    on turdorza needed saba albuterol less than a couple times a week and no neb   No obvious daytime variabilty or assoc excess or purulent sputum or cp or chest tightness, subjective wheeze overt sinus or hb symptoms. No unusual exp hx or h/o childhood pna/ asthma or premature birth to her knowledge.   Sleeping ok without nocturnal  or early am exacerbation  of respiratory  c/o's or need for noct saba. Also denies any obvious fluctuation of  symptoms with weather or environmental changes or other aggravating or alleviating factors except as outlined above   Current Medications, Allergies, Past Medical History, Past Surgical History, Family History, and Social History were reviewed in [employer] record.  ROS  The following are not active complaints unless bolded sore throat, dysphagia, dental problems, itching, sneezing,  nasal congestion or excess/ purulent secretions, ear ache,   fever, chills, sweats, unintended wt loss, pleuritic or exertional cp, hemoptysis,  orthopnea pnd or leg swelling, presyncope, palpitations, heartburn, abdominal pain, anorexia, nausea, vomiting, diarrhea  or change in bowel or urinary habits, change in stools or urine, dysuria,hematuria,   rash, arthralgias, visual complaints, headache, numbness weakness or ataxia or problems with walking or coordination,  change in mood/affect or memory.               Objective:   Physical Exam  05/08/2012  Wt 195  > 199 06/12/2012 > 201 08/01/2012 > 09/23/2012  204  Wt Readings from Last 3 Encounters:  04/10/12 194 lb 9.6 oz (88.27 kg)  02/20/12 190 lb 12.8 oz (86.546 kg)     HEENT mild turbinate edema.  Oropharynx no thrush or excess pnd or cobblestoning.  No JVD or cervical adenopathy. Mild accessory muscle hypertrophy. Trachea midline, nl thryroid. Chest was hyperinflated by percussion with diminished breath sounds and moderate increased exp time without wheeze. Hoover sign positive at mid inspiration. Regular rate and rhythm without murmur gallop or rub or increase P2 or edema.  Abd: no hsm, nl excursion. Ext warm without cyanosis or clubbing.    cxr 02/17/12 No active cardiopulmonary disease.     Assessment & Plan:

## 2012-09-26 NOTE — Assessment & Plan Note (Signed)
>   3min  I took an extended  opportunity with this patient to outline the consequences of continued cigarette use  in airway disorders based on all the data we have from the multiple national lung health studies (perfomed over decades at millions of dollars in cost)  indicating that smoking cessation, not choice of inhalers or physicians, is the most important aspect of her care.   

## 2012-09-26 NOTE — Assessment & Plan Note (Signed)
-   HFA 75% p coaching 04/10/12> 90% p coaching 05/08/2012  - PFT's 06/12/2012 FEV1  1.41 (60%) ratio 64 and no better p B2  And DLCO 51 corrects to 94 % - 06/12/2012 refer to Rehab - 08/02/2012 started tudorza > improved  Has not started rehab, did much better with LAMA thatn LABA/ICS typcil of more of any emphysematous and warned repeatedly that the only way to change the natural hx is d/c cigs (discussed sepatately)  For now rec resume tudorza, minimize saba    Each maintenance medication was reviewed in detail including most importantly the difference between maintenance and as needed and under what circumstances the prns are to be used.  Please see instructions for details which were reviewed in writing and the patient given a copy.

## 2013-06-16 ENCOUNTER — Ambulatory Visit (INDEPENDENT_AMBULATORY_CARE_PROVIDER_SITE_OTHER): Payer: Medicare HMO | Admitting: Internal Medicine

## 2013-06-16 ENCOUNTER — Encounter: Payer: Self-pay | Admitting: Internal Medicine

## 2013-06-16 VITALS — BP 160/100 | HR 102 | Temp 99.0°F | Ht 65.0 in | Wt 198.0 lb

## 2013-06-16 DIAGNOSIS — N6320 Unspecified lump in the left breast, unspecified quadrant: Secondary | ICD-10-CM

## 2013-06-16 DIAGNOSIS — Y639 Failure in dosage during unspecified surgical and medical care: Secondary | ICD-10-CM

## 2013-06-16 DIAGNOSIS — Z9889 Other specified postprocedural states: Secondary | ICD-10-CM | POA: Insufficient documentation

## 2013-06-16 DIAGNOSIS — I10 Essential (primary) hypertension: Secondary | ICD-10-CM

## 2013-06-16 DIAGNOSIS — R110 Nausea: Secondary | ICD-10-CM | POA: Insufficient documentation

## 2013-06-16 DIAGNOSIS — J441 Chronic obstructive pulmonary disease with (acute) exacerbation: Secondary | ICD-10-CM

## 2013-06-16 DIAGNOSIS — R0602 Shortness of breath: Secondary | ICD-10-CM

## 2013-06-16 DIAGNOSIS — F17200 Nicotine dependence, unspecified, uncomplicated: Secondary | ICD-10-CM

## 2013-06-16 DIAGNOSIS — J449 Chronic obstructive pulmonary disease, unspecified: Secondary | ICD-10-CM

## 2013-06-16 DIAGNOSIS — Z91120 Patient's intentional underdosing of medication regimen due to financial hardship: Secondary | ICD-10-CM

## 2013-06-16 DIAGNOSIS — F4322 Adjustment disorder with anxiety: Secondary | ICD-10-CM | POA: Insufficient documentation

## 2013-06-16 DIAGNOSIS — N63 Unspecified lump in unspecified breast: Secondary | ICD-10-CM

## 2013-06-16 MED ORDER — METHYLPREDNISOLONE ACETATE 80 MG/ML IJ SUSP
120.0000 mg | Freq: Once | INTRAMUSCULAR | Status: AC
  Administered 2013-06-16: 120 mg via INTRAMUSCULAR

## 2013-06-16 MED ORDER — PROMETHAZINE HCL 25 MG PO TABS: 25.0000 mg | ORAL_TABLET | Freq: Four times a day (QID) | ORAL | Status: DC | PRN

## 2013-06-16 MED ORDER — PREDNISONE 20 MG PO TABS: ORAL_TABLET | ORAL | Status: DC

## 2013-06-16 MED ORDER — FLUTICASONE-SALMETEROL 250-50 MCG/DOSE IN AEPB: 1.0000 | INHALATION_SPRAY | Freq: Two times a day (BID) | RESPIRATORY_TRACT | Status: DC

## 2013-06-16 NOTE — Patient Instructions (Addendum)
Will arrange  Mammogram evaluation  Or the right breast area. Prednisone  Can begin tomorrow  For 5 days . Begin advair.   For now . arrange to sgt dr wert to see you.  bllod pressure may need medication to help.  We may need to  Begin on controller medication for anxiety such as lexapro or zoloft  To help  Control anxiety  Lab tests  Advised also. Please  return for fasting labs ( i will put in orders to include thyroid etc. ) Recheck in 3-4 weeks . Or as needed.   Stop[ tobacco to help your lungs

## 2013-06-16 NOTE — Progress Notes (Signed)
Chief Complaint  Patient presents with  . Establish Care    HPI: Patient comes in today for new patient visit  Prev care was   Urgent care and ? Dr Melvyn Novas .  She had not had adequate insurance and therefore did not get regular medical care until this visit. She was hospitalized about a year ago with severe respiratory COPD exacerbation and Dr. Melvyn Novas was able to follow up post hospitalization give her samples and make the best of it through last summer. Has been to urgent care x 2 in this year  With copd ex and uti  This year x 1  Has been followed by pulmonary for copd   Gold II is an active smoker  but had quit and then went back to 8 today because of some stresses. Was in hospital with copd .   Last year  disability  In 2001  For  neck  problems requiring 4 surgeries [facility] agnes and  cts   2 anterior surgeries and  Fusion  .last surgery . About 2002  Was on pain management  For 8-9 years  And many meds worse than the pain. So stopped the meds .  Morphine on wd. 4 years ago has not been on controlled medicine at this point she uses Ibuprofen.  Few glasses of wine. Occasionally  rx depression when on meds. Denies significant depression at this time  Tobacco : Had quit last year and now  8 per day. March of 2014  Cost of meds   A problem . Is been on periods inhalers with samples but has a nebulizer at home using it she feels a bit too frequentlyFrom pulmonary situation. He is wheezing again and is now off the prednisone taper that she got at the urgent care  Lump in left breast  has noticed it for 5 months small not really changing nontender didn't have insurance  Knots under skin . No cancer has had a few of them are moved some times one of them hurts on her trunk  Nausea anxiety.  ? Uncertain cause would like a medicine for nausea insurance won't pay for Zofran may pay for Phenergan no active vomiting.  Has had some increasing anxiety over the last number of months  Remote hx of valium . In  90s . For.  stress. He took it as needed at that time   Living with fiance who doesn't smoke for about 4 years.    widowed her husband died of CHF.  Health Maintenance  Topic Date Due  . Tetanus/tdap  11/19/1974  . Mammogram  11/18/2005  . Pap Smear  02/20/2007  . Colonoscopy  02/20/2011  . Influenza Vaccine  09/19/2013   Health Maintenance Review   ROS:  GEN/ HEENT: No fever, significant weight changes sweats headaches vision problems hearing changes, CV/ PULM; No chest pain  syncope,edema   Se hpi  GI /GU: No adominal pain, vomiting, change in bowel habits. She does have GERD history of polyps in the colon. No blood in the stool. No significant GU symptoms. SKIN/HEME: ,no acute skin rashes suspicious lesions or bleeding. No lymphadenopathy, nodules, masses.  NEURO/ PSYCH:  No neurologic signs such as weakness numbness. No depression anxiety. 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 IMM/ Allergy: No unusual infections.  Allergy .   REST of 12 system review negative except as per HPI   Past Medical History  Diagnosis Date  . COPD (chronic obstructive pulmonary  disease)   . Hypertension   . Asthma   . Emphysema   . Hx of varicella   . Heartburn   . Genital warts   . High cholesterol   . Colon polyps   . 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   Marland Kitchen Hx: UTI (urinary tract infection)   . Pain management     hx of same in [place]  multiple controlled substances that she weaned off 4 years ago cand not any worse takes advil as bneeded     Family History  Problem Relation Age of Onset  . CAD Father     tumor between heart and lung   . CAD Sister     ? dx   . Hypertension Father   . Hypertension Mother   . Lung cancer Mother     died 2   . Diabetes Maternal Grandmother     late 50s     History   Social History  . Marital Status: Widowed    Spouse Name: N/A    Number of Children: 2  . Years of Education: N/A   Occupational History  . retired    Social  History Main Topics  . Smoking status: Current Some Day Smoker -- 1.50 packs/day for 44 years    Types: Cigarettes  . Smokeless tobacco: Never Used     Comment: Smokes "once in a while."    . Alcohol Use: 2.0 oz/week    4 drink(s) per week  . Drug Use: No  . Sexual Activity: None   Other Topics Concern  . None   Social History Narrative   6-8 hours of sleep per night   Lives with her fiance   1 dog in the home   Retired no ets  But tob 8 per day   etoh ocass 1-2    On disability from her neck surgery predicaments .   orig from [employer] in [place] area.    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 Aclidinium Bromide (TUDORZA PRESSAIR) 400 MCG/ACT AEPB Inhale 1 puff into the lungs 2 (two) times daily. One twice daily  . albuterol (PROVENTIL HFA;VENTOLIN HFA) 108 (90 BASE) MCG/ACT inhaler Inhale 2 puffs into the lungs every 4 (four) hours as needed for wheezing or shortness of breath.  Marland Kitchen albuterol (PROVENTIL) (2.5 MG/3ML) 0.083% nebulizer solution Take 3 mLs (2.5 mg total) by nebulization every 6 (six) hours as needed for wheezing.  Marland Kitchen aspirin 81 MG tablet Take 81 mg by mouth daily.  . bisoprolol (ZEBETA) 5 MG tablet Take 1 tablet (5 mg total) by mouth daily.  . cetirizine (ZYRTEC) 10 MG tablet Take 10 mg by mouth daily.  Marland Kitchen CINNAMON PO Take by mouth.  Drusilla Kanner EXTRACT PO Take 1 tablet by mouth daily.  Marland Kitchen ipratropium (ATROVENT) 0.02 % nebulizer solution Take 2.5 mLs (500 mcg total) by nebulization 4 (four) times daily as needed for wheezing.  . Multiple Vitamins-Minerals (CENTRUM PO) Take by mouth daily.  . Omega-3 Fatty Acids (FISH OIL) 1000 MG CAPS Take 1,000 mg by mouth daily.  . Ranitidine HCl (ZANTAC PO) Take 1 tablet by mouth daily.  Marland Kitchen tiotropium (SPIRIVA) 18 MCG inhalation capsule Place 18 mcg into inhaler and inhale  daily.  . vitamin  E 200 UNIT capsule Take 200 Units by mouth daily.  . [DISCONTINUED] nicotine (NICODERM CQ - DOSED IN MG/24 HOURS) 21 mg/24hr patch Place 1 patch onto the skin daily.  . [DISCONTINUED] predniSONE (STERAPRED UNI-PAK) 10 MG tablet Prednisone 10 mg take  4 each am x 2 days,   2 each am x 2 days,  1 each am x2days and stop  . Fluticasone-Salmeterol (ADVAIR DISKUS) 250-50 MCG/DOSE AEPB Inhale 1 puff into the lungs 2 (two) times daily.  . predniSONE (DELTASONE) 20 MG tablet Take 3 po qd for 2 days then 2 po qd for 3 days,or as directed  . promethazine (PHENERGAN) 25 MG tablet Take 1 tablet (25 mg total) by mouth every 6 (six) hours as needed for nausea or vomiting.  . [EXPIRED] methylPREDNISolone acetate (DEPO-MEDROL) injection 120 mg     EXAM:  BP 160/100  Pulse 102  Temp(Src) 99 F (37.2 C) (Oral)  Ht 5\' 5"  (1.651 m)  Wt 198 lb (89.812 kg)  BMI 32.95 kg/m2  SpO2 95%  Body mass index is 32.95 kg/(m^2).  Physical Exam: Vital signs reviewed KZS:WFUX is a well-developed well-nourished alert cooperative    who appearsr stated age in no acute distress. She does have increased work of breathing and dyspnea on exertion. Color is good HEENT: normocephalic atraumatic , Eyes: PERRL EOM's full, conjunctiva clear, Nares: paten,t no deformity discharge or tenderness., Ears: no deformity EAC's clear TMs with normal landmarks. Mouth: clear OP, no lesions, edema.  Moist mucous membranes. Dentition i has partial dentures NECK: supple without masses, thyromegaly or bruits. CHEST/PULM:  Increased AP diameter decreased breath sounds with a few wheezes no intercostal retractions or flaring color is adequate negative CCE CV: PMI i somewhat distant S1 S2 no gallops, murmurs, rubs. Peripheral pulses are full without delay.No JVD .  Breast exam well-healed scars around the nipple. Left breast with a tiny but very firm 1 mm nodule about 10 or 11:00 without dimpling or other skin changes axilla is  clear ABDOMEN: Bowel sounds normal nontender  No guard or rebound, no hepato splenomegal no CVA tenderness.  Extremtities:  No clubbing cyanosis or edema, no acute joint swelling or redness no focal atrophy NEURO:  Oriented x3, cranial nerves 3-12 appear to be intact, no obvious focal weakness,gait within normal limits no abnormal reflexes or asymmetrical no tremor is noted SKIN: No acute rashes normal turgor, color, no bruising or petechiae. There number of subcutaneous superficial nodules 1-2 cm feel like cysts on the trunk with no skin changes skin is tanned. PSYCH: Oriented, good eye contact, no obvious depression mild anxiety possible, cognition and judgment appear normal. LN: no cervical axillary  adenopathy  ASSESSMENT AND PLAN:  Discussed the following assessment and plan:  Left breast lump - dx mammo and [place] - Plan: MM Digital Diagnostic Unilat L, Basic metabolic panel, CBC with Differential, Hepatic function panel, Lipid panel, TSH, T4, free, T3, free, US BREAST LTD UNI LEFT INC AXILLA, CANCELED: US BREAST COMPLETE UNI LEFT INC AXILLA  Nausea alone - ? cause  phenergan inauranc not apyf or zofran will follo w - Plan: Basic metabolic panel, CBC with Differential, Hepatic function panel, Lipid panel, TSH, T4, free, T3, free  Adjustment disorder with anxious mood - address at fu - Plan: Basic metabolic panel, CBC with Differential, Hepatic function panel, Lipid panel, TSH, T4, free, T3, free  COPD GOLD II - Plan: Basic metabolic panel, CBC with Differential, Hepatic function panel, Lipid panel, TSH, T4, free, T3,  free, Ambulatory referral to Pulmonology, methylPREDNISolone acetate (DEPO-MEDROL) injection 120 mg  Essential hypertension, benign - Reassess in followup apparently unable to take acei was on by systolic may add medication if persist - Plan: Basic metabolic panel, CBC with Differential, Hepatic function panel, Lipid panel, TSH, T4, free, T3, free  Smoker - relpased  advised  stopping  - Plan: Basic metabolic panel, CBC with Differential, Hepatic function panel, Lipid panel, TSH, T4, free, T3, free, Ambulatory referral to Pulmonology  SOB (shortness of breath) - Plan: Basic metabolic panel, CBC with Differential, Hepatic function panel, Lipid panel, TSH, T4, free, T3, free, methylPREDNISolone acetate (DEPO-MEDROL) injection 120 mg  COPD exacerbation - Depo-Medrol 5 days prednisone samples of Advair given today plan getting back with Dr. Melvyn Novas no tobacco  Intentional underdosing of medication regimen by patient due to financial hardship  H/O cervical spine surgery Overall lack of primary care and  acess to care  samples of advair 250 given in the short run steroid depo and 5 days of pred    Stop tobacco all together again  Refer for mammo Plan readdress HT at rov  Apparently was on bystolic and had se of ace i or ineffective.  Will update immunizations are returned. As appropriate Patient Care Team: Burnis Medin, MD as PCP - General (Internal Medicine) Patient Instructions  Will arrange  Mammogram evaluation  Or the right breast area. Prednisone  Can begin tomorrow  For 5 days . Begin advair.   For now . arrange to sgt dr wert to see you.  bllod pressure may need medication to help.  We may need to  Begin on controller medication for anxiety such as lexapro or zoloft  To help  Control anxiety  Lab tests  Advised also. Please  return for fasting labs ( i will put in orders to include thyroid etc. ) Recheck in 3-4 weeks . Or as needed.   Stop[ tobacco to help your lungs     Standley Brooking. Panosh M.D.   Pre visit review using our clinic review tool, if applicable. No additional management support is needed unless otherwise documented below in the visit note. Total visit 46mins > 50% spent counseling and coordinating care

## 2013-06-17 ENCOUNTER — Telehealth: Payer: Self-pay | Admitting: Internal Medicine

## 2013-06-17 NOTE — Telephone Encounter (Signed)
Relevant patient education assigned to patient using Emmi. ° °

## 2013-06-19 ENCOUNTER — Ambulatory Visit (INDEPENDENT_AMBULATORY_CARE_PROVIDER_SITE_OTHER)
Admission: RE | Admit: 2013-06-19 | Discharge: 2013-06-19 | Disposition: A | Discharge status: Home / Self Care | Payer: Medicare HMO | Source: Ambulatory Visit | Attending: Internal Medicine | Admitting: Internal Medicine

## 2013-06-19 ENCOUNTER — Ambulatory Visit (INDEPENDENT_AMBULATORY_CARE_PROVIDER_SITE_OTHER): Payer: Medicare HMO | Admitting: Internal Medicine

## 2013-06-19 ENCOUNTER — Encounter: Payer: Self-pay | Admitting: Internal Medicine

## 2013-06-19 VITALS — BP 162/98 | HR 112 | Temp 98.5°F | Ht 66.0 in | Wt 199.0 lb

## 2013-06-19 DIAGNOSIS — F17200 Nicotine dependence, unspecified, uncomplicated: Secondary | ICD-10-CM

## 2013-06-19 DIAGNOSIS — J449 Chronic obstructive pulmonary disease, unspecified: Secondary | ICD-10-CM

## 2013-06-19 MED ORDER — BUDESONIDE-FORMOTEROL FUMARATE 160-4.5 MCG/ACT IN AERO: INHALATION_SPRAY | RESPIRATORY_TRACT | Status: DC

## 2013-06-19 NOTE — Progress Notes (Signed)
Subjective:    Patient ID: Samantha Hinton, female    DOB: 12-06-1955 MRN: [id number]  HPI  24 yowf smoker with GOLD II COPD  baseline doe x best days able to do extensive shopping s 02 better with albuterol when had access to it then admitted [facility]:   Admit Date: 02/17/2012  PRIMARY DISCHARGE DIAGNOSIS:  URI (upper respiratory infection)  COPD exacerbation  SOB (shortness of breath)  Hypoxemia  UTI (lower urinary tract infection)  Diarrhea  Tobacco abuse    04/10/2012 1st pulmonary ov  On ACEI cc doe x 5 steps on neb and albuterol 7 x daily with persistent sense of chest tightness no better since discharge rec Stop lisinopril and start bystolic 5 mg one daily Start dulera 100 Take 2 puffs first thing in am and then another 2 puffs about 12 hours later.  Plan B  Only use your albuterol inhaler, plan C is your nebulizer) as a rescue medication   GERD diet   05/08/2012 f/u ov/Kaivon Livesey cc breathing much better on dulera 497 and bystolic no need for saba or neb despite head and chest cold since last ov. rec Start bisoprolol 5 mg daily in place of bystolic - break in half daily if too strong dulera 200 Take 2 puffs first thing in am and then another 2 puffs about 12 hours later.  06/12/2012 f/u ov/Kobi Mario re copd Chief Complaint  Patient presents with  . Follow-up    Breathing some worse for the past 2 wks, relates to allergy season. Cough has improve some.    takes zyrtec year round, very nervous using saba 1-2 x daily but typically not at rest or hs.  Minimal impact on activity tolerance though. rec Zyrtec should be used one daily as needed for itching sneezing runny nose Blow out through through the nose Please see patient coordinator before you leave today  to schedule pulmonary rehab > placed on wt list  Prednisone 10 mg take  4 each am x 2 days,   2 each am x 2 days,  1 each am x2days and stop   08/01/2012 f/u ov/Elodie Panameno still smoking Chief Complaint  Patient presents with  .  Follow-up    Pt states that her breathing is unchanged since last visit. Cough is some better.   still using hfa albuterol before  And after dulera but  not needing nebulizer alb and not limited from desired activities - still on wt list for rehab. Not convinced dulera really helping her or reducing perceived need for saba and no real change since ran out x > 2 weeks prior to OV   rec tudorza one twice daily on a trial basis Only use your albuterol (ventolin 1st, nebulizer is second)    09/23/2012 f/u ov/Joni Colegrove still smoking Chief Complaint  Patient presents with  . 4 wk follow up    Has ran out of [place].  Breathing improved while on this medicaiton but has noticed more wheezing now that she is off of it.    on turdorza needed saba albuterol less than a couple times a week and no neb rec Resume tudorza one twice daily  Only use your albuterol as a rescue medication to be used if you can't catch your breath by resting or doing a relaxed purse lip breathing pattern. The less you use it, the better it will work when you need it.  The key is to stop smoking completely before smoking completely stops you!  Prednisone 10 mg take  4 each am x 2 days,   2 each am x 2 days,  1 each am x 2 days and stop  For any flare of resp symptoms add ranitidine twice daily esp the bedtime dose     06/19/2013 f/u ov/Stepanie Graver re: GOLD II/ still smoking freq copd exac,  responds to prednisone very well  Chief Complaint  Patient presents with  . Acute Visit    Pt c/o increased cough, SOB and wheezing for the past 5 months. Cough is occ prod with minimal brown sputum.  She has used neb with albuterol and atrovent twice in the past wk.   advair added 06/16/13 / no lama  Has neb with sep alb/atrovent, no hfa alb/still smoking    No obvious daytime variabilty or assoc excess or purulent sputum or cp or chest tightness, subjective wheeze overt sinus or hb symptoms. No unusual exp hx or h/o childhood pna/ asthma or  premature birth to her knowledge.   Sleeping ok without nocturnal  or early am exacerbation  of respiratory  c/o's or need for noct saba. Also denies any obvious fluctuation of symptoms with weather or environmental changes or other aggravating or alleviating factors except as outlined above   Current Medications, Allergies, Past Medical History, Past Surgical History, Family History, and Social History were reviewed in [employer] record.  ROS  The following are not active complaints unless bolded sore throat, dysphagia, dental problems, itching, sneezing,  nasal congestion or excess/ purulent secretions, ear ache,   fever, chills, sweats, unintended wt loss, pleuritic or exertional cp, hemoptysis,  orthopnea pnd or leg swelling, presyncope, palpitations, heartburn, abdominal pain, anorexia, nausea, vomiting, diarrhea  or change in bowel or urinary habits, change in stools or urine, dysuria,hematuria,  rash, arthralgias, visual complaints, headache, numbness weakness or ataxia or problems with walking or coordination,  change in mood/affect or memory.               Objective:   Physical Exam  05/08/2012  Wt 195  > 199 06/12/2012 > 201 08/01/2012 > 09/23/2012  204 > 06/19/2013  199 Wt Readings from Last 3 Encounters:  04/10/12 194 lb 9.6 oz (88.27 kg)  02/20/12 190 lb 12.8 oz (86.546 kg)     HEENT mild turbinate edema.  Oropharynx no thrush or excess pnd or cobblestoning.  No JVD or cervical adenopathy. Mild accessory muscle hypertrophy. Trachea midline, nl thryroid. Chest was hyperinflated by percussion with diminished breath sounds and moderate increased exp time without wheeze. Hoover sign positive at mid inspiration. Regular rate and rhythm without murmur gallop or rub or increase P2 or edema.  Abd: no hsm, nl excursion. Ext warm without cyanosis or clubbing.    CXR  06/19/2013 :  The heart size and mediastinal contours are within normal limits.  Both lungs are clear. No  pneumothorax or pleural effusion is noted.  The visualized skeletal structures are unremarkable.     Assessment & Plan:

## 2013-06-19 NOTE — Patient Instructions (Addendum)
symbicort 160 Take 2 puffs first thing in am and then another 2 puffs about 12 hours later.   Only use your albuterol/atrovent neb  as a rescue medication to be used if you can't catch your breath by resting or doing a relaxed purse lip breathing pattern.  - The less you use it, the better it will work when you need it. - Ok to use up    every 4 hours if you must but call for immediate appointment if use goes up over your usual need  Stop advair  The key is to stop smoking completely before smoking completely stops you!   For any flare of resp symptoms add ranitidine twice daily esp the bedtime dose   GERD (REFLUX)  is an extremely common cause of respiratory symptoms, many times with no significant heartburn at all.    It can be treated with medication, but also with lifestyle changes including avoidance of late meals, excessive alcohol, smoking cessation, and avoid fatty foods, chocolate, peppermint, colas, red wine, and acidic juices such as orange juice.  NO MINT OR MENTHOL PRODUCTS SO NO COUGH DROPS  USE SUGARLESS CANDY INSTEAD (jolley ranchers or Stover's)  NO OIL BASED VITAMINS - use powdered substitutes - no fish oil when flaring !     Please remember to go to the  x-ray department downstairs for your tests - we will call you with the results when they are available.  Please schedule a follow up office visit in 2 weeks, sooner if needed

## 2013-06-19 NOTE — Progress Notes (Signed)
Quick Note:  LMTCB ______ 

## 2013-06-21 NOTE — Assessment & Plan Note (Signed)

## 2013-06-21 NOTE — Assessment & Plan Note (Signed)
-   PFT's 06/12/2012 FEV1  1.41 (60%) ratio 64 and no better p B2  And DLCO 51 corrects to 94 % - 06/12/2012 refer to Rehab - 08/02/2012 started tudorza > improved  DDX of  difficult airways managment all start with A and  include Adherence, Ace Inhibitors, Acid Reflux, Active Sinus Disease, Alpha 1 Antitripsin deficiency, Anxiety masquerading as Airways dz,  ABPA,  allergy(esp in young), Aspiration (esp in elderly), Adverse effects of DPI,  Active smokers, plus two Bs  = Bronchiectasis and Beta blocker use..and one C= CHF  Adherence is always the initial "prime suspect" and is a multilayered concern that requires a "trust but verify" approach in every patient - starting with knowing how to use medications, especially inhalers, correctly, keeping up with refills and understanding the fundamental difference between maintenance and prns vs those medications only taken for a very short course and then stopped and not refilled.  - The proper method of use, as well as anticipated side effects, of a metered-dose inhaler are discussed and demonstrated to the patient. Improved effectiveness after extensive coaching during this visit to a level of approximately  75% so try symbicort 160 2 bid as not better on advair yet   ? Acid (or non-acid) GERD > always difficult to exclude as up to 75% of pts in some series report no assoc GI/ Heartburn symptoms> rec max (24h)  acid suppression and diet restrictions/ reviewed and instructions given in writing.   Active smoking > discussed separately  See instructions for specific recommendations which were reviewed directly with the patient who was given a copy with highlighter outlining the key components.

## 2013-06-22 NOTE — Progress Notes (Signed)
Quick Note:  LMTCB ______ 

## 2013-06-23 ENCOUNTER — Telehealth: Payer: Self-pay | Admitting: Internal Medicine

## 2013-06-23 NOTE — Telephone Encounter (Signed)
I spoke with the pt and gave her cxr results and she verbalized understanding  Nothing further needed

## 2013-06-29 NOTE — Progress Notes (Signed)
Quick Note:  LMTCB ______ 

## 2013-06-30 ENCOUNTER — Telehealth: Payer: Self-pay | Admitting: Internal Medicine

## 2013-06-30 NOTE — Progress Notes (Signed)
Quick Note:  Spoke with pt and notified of results per Dr. Wert. Pt verbalized understanding and denied any questions.  ______ 

## 2013-06-30 NOTE — Telephone Encounter (Signed)
Pt aware cxr results  Nothing further needed

## 2013-07-01 ENCOUNTER — Ambulatory Visit (INDEPENDENT_AMBULATORY_CARE_PROVIDER_SITE_OTHER): Payer: Medicare HMO | Admitting: Internal Medicine

## 2013-07-01 ENCOUNTER — Encounter: Payer: Self-pay | Admitting: Internal Medicine

## 2013-07-01 ENCOUNTER — Ambulatory Visit
Admission: RE | Admit: 2013-07-01 | Discharge: 2013-07-01 | Disposition: A | Discharge status: Home / Self Care | Payer: Medicare HMO | Source: Ambulatory Visit | Attending: Internal Medicine | Admitting: Internal Medicine

## 2013-07-01 ENCOUNTER — Telehealth: Payer: Self-pay | Admitting: Internal Medicine

## 2013-07-01 ENCOUNTER — Other Ambulatory Visit: Payer: Self-pay | Admitting: Internal Medicine

## 2013-07-01 VITALS — BP 160/90 | HR 93 | Temp 98.4°F | Ht 66.0 in | Wt 195.0 lb

## 2013-07-01 DIAGNOSIS — N6320 Unspecified lump in the left breast, unspecified quadrant: Secondary | ICD-10-CM

## 2013-07-01 DIAGNOSIS — J449 Chronic obstructive pulmonary disease, unspecified: Secondary | ICD-10-CM

## 2013-07-01 DIAGNOSIS — F17200 Nicotine dependence, unspecified, uncomplicated: Secondary | ICD-10-CM

## 2013-07-01 MED ORDER — BUDESONIDE-FORMOTEROL FUMARATE 160-4.5 MCG/ACT IN AERO: INHALATION_SPRAY | RESPIRATORY_TRACT | Status: DC

## 2013-07-01 MED ORDER — ALBUTEROL SULFATE HFA 108 (90 BASE) MCG/ACT IN AERS: 2.0000 | INHALATION_SPRAY | RESPIRATORY_TRACT | Status: DC | PRN

## 2013-07-01 NOTE — Progress Notes (Signed)
Subjective:    Patient ID: Samantha Hinton, female    DOB: December 05, 1955 MRN: [id number]    Brief patient profile:  28 yowf smoker with GOLD II COPD  baseline doe x best days able to do extensive shopping s 02 better with albuterol when had access to it then admitted [facility]:   Admit Date: 02/17/2012  PRIMARY DISCHARGE DIAGNOSIS:  URI (upper respiratory infection)  COPD exacerbation  SOB (shortness of breath)  Hypoxemia  UTI (lower urinary tract infection)  Diarrhea  Tobacco abuse    04/10/2012 1st pulmonary ov  On ACEI cc doe x 5 steps on neb and albuterol 7 x daily with persistent sense of chest tightness no better since discharge rec Stop lisinopril and start bystolic 5 mg one daily Start dulera 100 Take 2 puffs first thing in am and then another 2 puffs about 12 hours later.  Plan B  Only use your albuterol inhaler, plan C is your nebulizer) as a rescue medication   GERD diet   05/08/2012 f/u ov/Samantha Hinton cc breathing much better on dulera 465 and bystolic no need for saba or neb despite head and chest cold since last ov. rec Start bisoprolol 5 mg daily in place of bystolic - break in half daily if too strong dulera 200 Take 2 puffs first thing in am and then another 2 puffs about 12 hours later.  06/12/2012 f/u ov/Samantha Hinton re copd Chief Complaint  Patient presents with  . Follow-up    Breathing some worse for the past 2 wks, relates to allergy season. Cough has improve some.    takes zyrtec year round, very nervous using saba 1-2 x daily but typically not at rest or hs.  Minimal impact on activity tolerance though. rec Zyrtec should be used one daily as needed for itching sneezing runny nose Blow out through through the nose Please see patient coordinator before you leave today  to schedule pulmonary rehab > placed on wt list  Prednisone 10 mg take  4 each am x 2 days,   2 each am x 2 days,  1 each am x2days and stop   08/01/2012 f/u ov/Samantha Hinton still smoking Chief Complaint  Patient  presents with  . Follow-up    Pt states that her breathing is unchanged since last visit. Cough is some better.   still using hfa albuterol before  And after dulera but  not needing nebulizer alb and not limited from desired activities - still on wt list for rehab. Not convinced dulera really helping her or reducing perceived need for saba and no real change since ran out x > 2 weeks prior to OV   rec tudorza one twice daily on a trial basis Only use your albuterol (ventolin 1st, nebulizer is second)    09/23/2012 f/u ov/Samantha Hinton still smoking Chief Complaint  Patient presents with  . 4 wk follow up    Has ran out of [place].  Breathing improved while on this medicaiton but has noticed more wheezing now that she is off of it.    on turdorza needed saba albuterol less than a couple times a week and no neb rec Resume tudorza one twice daily  Only use your albuterol as a rescue medication to be used if you can't catch your breath by resting or doing a relaxed purse lip breathing pattern. The less you use it, the better it will work when you need it.  The key is to stop smoking completely before smoking completely stops you!  Prednisone 10 mg take  4 each am x 2 days,   2 each am x 2 days,  1 each am x 2 days and stop  For any flare of resp symptoms add ranitidine twice daily esp the bedtime dose     06/19/2013 f/u ov/Samantha Hinton re: GOLD II/ still smoking freq copd exac,  responds to prednisone very well  Chief Complaint  Patient presents with  . Acute Visit    Pt c/o increased cough, SOB and wheezing for the past 5 months. Cough is occ prod with minimal brown sputum.  She has used neb with albuterol and atrovent twice in the past wk.   advair added 06/16/13 / no lama  Has neb with sep alb/atrovent, no hfa alb/still smoking First thing in am and then another 2 puffs about 12 hours later.  Only use your albuterol/atrovent neb  as a rescue  Stop advair The key is to stop smoking completely before  smoking completely stops you!  For any flare of resp symptoms add ranitidine twice daily esp the bedtime dose  GERD  Diet    07/01/2013 f/u ov/Samantha Hinton re:  GOLD II still smoking  Chief Complaint  Patient presents with  . Follow-up    Cough has pretty much resolved. Breathing has returned to her normal baseline with no need for neb.   Not limited by breathing from desired activities    No obvious daytime variabilty or assoc excess or purulent sputum or cp or chest tightness, subjective wheeze overt sinus or hb symptoms. No unusual exp hx or h/o childhood pna/ asthma or premature birth to her knowledge.   Sleeping ok without nocturnal  or early am exacerbation  of respiratory  c/o's or need for noct saba. Also denies any obvious fluctuation of symptoms with weather or environmental changes or other aggravating or alleviating factors except as outlined above   Current Medications, Allergies, Past Medical History, Past Surgical History, Family History, and Social History were reviewed in [facility] record.  ROS  The following are not active complaints unless bolded sore throat, dysphagia, dental problems, itching, sneezing,  nasal congestion or excess/ purulent secretions, ear ache,   fever, chills, sweats, unintended wt loss, pleuritic or exertional cp, hemoptysis,  orthopnea pnd or leg swelling, presyncope, palpitations, heartburn, abdominal pain, anorexia, nausea, vomiting, diarrhea  or change in bowel or urinary habits, change in stools or urine, dysuria,hematuria,  rash, arthralgias, visual complaints, headache, numbness weakness or ataxia or problems with walking or coordination,  change in mood/affect or memory.               Objective:   Physical Exam  05/08/2012  Wt 195  > 199 06/12/2012 > 201 08/01/2012 > 09/23/2012  204 > 06/19/2013  199 > 07/01/2013  195  Wt Readings from Last 3 Encounters:  04/10/12 194 lb 9.6 oz (88.27 kg)  02/20/12 190 lb 12.8 oz (86.546 kg)      HEENT mild turbinate edema.  Oropharynx no thrush or excess pnd or cobblestoning.  No JVD or cervical adenopathy. Mild accessory muscle hypertrophy. Trachea midline, nl thryroid. Chest was hyperinflated by percussion with diminished breath sounds and moderate increased exp time without wheeze. Hoover sign positive at mid inspiration. Regular rate and rhythm without murmur gallop or rub or increase P2 or edema.  Abd: no hsm, nl excursion. Ext warm without cyanosis or clubbing.    CXR  06/19/2013 :  The heart size and mediastinal contours are within normal  limits.  Both lungs are clear. No pneumothorax or pleural effusion is noted.  The visualized skeletal structures are unremarkable.     Assessment & Plan:

## 2013-07-01 NOTE — Telephone Encounter (Signed)
Pt requesting refill of promethazine (PHENERGAN) 25 MG tablet sent to [facility].

## 2013-07-01 NOTE — Telephone Encounter (Signed)
Change the ranitidine to otc prilosec once a day and refill the phenergan x 1  i

## 2013-07-01 NOTE — Telephone Encounter (Signed)
Left a message at the below listed number for the pt to return my call. 

## 2013-07-01 NOTE — Telephone Encounter (Signed)
Spoke to the pt.  She continues to have nausea and a "burning" sensation in her stomach.  No worse than before.  States the phenergan helps relieve the sx.  She would like a refill.  Is out of the medication.  She has a future lab and follow up appt scheduled.  Please advise.  Thanks!

## 2013-07-01 NOTE — Patient Instructions (Addendum)
Continue symbicort 160 Take 2 puffs first thing in am and then another 2 puffs about 12 hours later.   If there is any problem filling it please call back with alternatives per your formulary    If you are satisfied with your treatment plan let your doctor know and he/she can either refill your medications or you can return here when your prescription runs out.     If in any way you are not 100% satisfied,  please tell us.  If 100% better, tell your friends!

## 2013-07-02 MED ORDER — PROMETHAZINE HCL 25 MG PO TABS: 25.0000 mg | ORAL_TABLET | Freq: Four times a day (QID) | ORAL | Status: DC | PRN

## 2013-07-02 NOTE — Telephone Encounter (Signed)
Patient notified to stop Zantac and start OTC Prilosec.  Has some Nexium at home she will try.  Pulmonologist has also suggested this in the past.   Rx sent to the pharmacy by e-scribe.

## 2013-07-02 NOTE — Telephone Encounter (Signed)
Left a message at the below listed number for the pt to return my call. 

## 2013-07-03 NOTE — Assessment & Plan Note (Signed)
-   PFT's 06/12/2012 FEV1  1.41 (60%) ratio 64 and no better p B2  And DLCO 51 corrects to 94 % - 06/12/2012 refer to Rehab - 08/02/2012 started tudorza > improved - 06/19/2013  >  Started symbicort 2 bid   The proper method of use, as well as anticipated side effects, of a metered-dose inhaler are discussed and demonstrated to the patient. Improved effectiveness after extensive coaching during this visit to a level of approximately  75% so should continue symbicort 160 2bid

## 2013-07-03 NOTE — Assessment & Plan Note (Signed)
>   3 min discussion I reviewed the Flethcher curve with patient that basically indicates  if you quit smoking when your best day FEV1 is still well preserved (as is the case here)  it is highly unlikely you will progress to severe disease and informed the patient there was no medication on the market that has proven to change the curve or the likelihood of progression.  Therefore stopping smoking and maintaining abstinence is the most important aspect of care, not choice of inhalers or for that matter, doctors.    Pulmonary f/u can be prn

## 2013-07-09 ENCOUNTER — Other Ambulatory Visit (INDEPENDENT_AMBULATORY_CARE_PROVIDER_SITE_OTHER): Payer: Medicare HMO

## 2013-07-09 DIAGNOSIS — N63 Unspecified lump in unspecified breast: Secondary | ICD-10-CM

## 2013-07-09 DIAGNOSIS — I10 Essential (primary) hypertension: Secondary | ICD-10-CM

## 2013-07-09 DIAGNOSIS — R110 Nausea: Secondary | ICD-10-CM

## 2013-07-09 DIAGNOSIS — F4322 Adjustment disorder with anxiety: Secondary | ICD-10-CM

## 2013-07-09 DIAGNOSIS — E785 Hyperlipidemia, unspecified: Secondary | ICD-10-CM

## 2013-07-09 DIAGNOSIS — R0602 Shortness of breath: Secondary | ICD-10-CM

## 2013-07-09 DIAGNOSIS — N6320 Unspecified lump in the left breast, unspecified quadrant: Secondary | ICD-10-CM

## 2013-07-09 DIAGNOSIS — J449 Chronic obstructive pulmonary disease, unspecified: Secondary | ICD-10-CM

## 2013-07-09 DIAGNOSIS — F17200 Nicotine dependence, unspecified, uncomplicated: Secondary | ICD-10-CM

## 2013-07-09 LAB — CBC WITH DIFFERENTIAL/PLATELET
BASOS PCT: 0.4 % (ref 0.0–3.0)
Basophils Absolute: 0 10*3/uL (ref 0.0–0.1)
EOS ABS: 0.1 10*3/uL (ref 0.0–0.7)
Eosinophils Relative: 1 % (ref 0.0–5.0)
HCT: 43.3 % (ref 36.0–46.0)
HEMOGLOBIN: 14.4 g/dL (ref 12.0–15.0)
LYMPHS ABS: 3 10*3/uL (ref 0.7–4.0)
Lymphocytes Relative: 30.3 % (ref 12.0–46.0)
MCHC: 33.3 g/dL (ref 30.0–36.0)
MCV: 91.2 fl (ref 78.0–100.0)
MONO ABS: 0.7 10*3/uL (ref 0.1–1.0)
Monocytes Relative: 6.7 % (ref 3.0–12.0)
Neutro Abs: 6 10*3/uL (ref 1.4–7.7)
Neutrophils Relative %: 61.6 % (ref 43.0–77.0)
Platelets: 204 10*3/uL (ref 150.0–400.0)
RBC: 4.75 Mil/uL (ref 3.87–5.11)
RDW: 16.5 % — ABNORMAL HIGH (ref 11.5–15.5)
WBC: 9.8 10*3/uL (ref 4.0–10.5)

## 2013-07-09 LAB — HEPATIC FUNCTION PANEL
ALK PHOS: 62 U/L (ref 39–117)
ALT: 31 U/L (ref 0–35)
AST: 23 U/L (ref 0–37)
Albumin: 3.9 g/dL (ref 3.5–5.2)
Bilirubin, Direct: 0.1 mg/dL (ref 0.0–0.3)
TOTAL PROTEIN: 7.3 g/dL (ref 6.0–8.3)
Total Bilirubin: 0.7 mg/dL (ref 0.2–1.2)

## 2013-07-09 LAB — BASIC METABOLIC PANEL
BUN: 19 mg/dL (ref 6–23)
CHLORIDE: 101 meq/L (ref 96–112)
CO2: 31 meq/L (ref 19–32)
Calcium: 9.4 mg/dL (ref 8.4–10.5)
Creatinine, Ser: 0.8 mg/dL (ref 0.4–1.2)
GFR: 76.2 mL/min (ref >60.00)
GLUCOSE: 84 mg/dL (ref 70–99)
Potassium: 4.8 mEq/L (ref 3.5–5.1)
SODIUM: 141 meq/L (ref 135–145)

## 2013-07-09 LAB — LIPID PANEL
Cholesterol: 292 mg/dL — ABNORMAL HIGH (ref 0–200)
HDL: 77 mg/dL (ref >39.00)
LDL Cholesterol: 171 mg/dL — ABNORMAL HIGH (ref 0–99)
Total CHOL/HDL Ratio: 4
Triglycerides: 218 mg/dL — ABNORMAL HIGH (ref 0.0–149.0)
VLDL: 43.6 mg/dL — ABNORMAL HIGH (ref 0.0–40.0)

## 2013-07-09 LAB — TSH: TSH: 1.16 u[IU]/mL (ref 0.35–4.50)

## 2013-07-09 LAB — T4, FREE: Free T4: 0.75 ng/dL (ref 0.60–1.60)

## 2013-07-09 LAB — T3, FREE: T3, Free: 2.7 pg/mL (ref 2.3–4.2)

## 2013-07-16 ENCOUNTER — Ambulatory Visit (INDEPENDENT_AMBULATORY_CARE_PROVIDER_SITE_OTHER): Payer: Medicare HMO | Admitting: Internal Medicine

## 2013-07-16 ENCOUNTER — Encounter: Payer: Self-pay | Admitting: Internal Medicine

## 2013-07-16 VITALS — BP 186/100 | HR 105 | Temp 99.1°F | Ht 65.0 in | Wt 195.0 lb

## 2013-07-16 DIAGNOSIS — J449 Chronic obstructive pulmonary disease, unspecified: Secondary | ICD-10-CM

## 2013-07-16 DIAGNOSIS — T148XXA Other injury of unspecified body region, initial encounter: Secondary | ICD-10-CM | POA: Insufficient documentation

## 2013-07-16 DIAGNOSIS — Z23 Encounter for immunization: Secondary | ICD-10-CM

## 2013-07-16 DIAGNOSIS — R110 Nausea: Secondary | ICD-10-CM

## 2013-07-16 DIAGNOSIS — Y639 Failure in dosage during unspecified surgical and medical care: Secondary | ICD-10-CM

## 2013-07-16 DIAGNOSIS — E785 Hyperlipidemia, unspecified: Secondary | ICD-10-CM

## 2013-07-16 DIAGNOSIS — Z91120 Patient's intentional underdosing of medication regimen due to financial hardship: Secondary | ICD-10-CM

## 2013-07-16 DIAGNOSIS — I10 Essential (primary) hypertension: Secondary | ICD-10-CM

## 2013-07-16 DIAGNOSIS — F4322 Adjustment disorder with anxiety: Secondary | ICD-10-CM

## 2013-07-16 DIAGNOSIS — F17200 Nicotine dependence, unspecified, uncomplicated: Secondary | ICD-10-CM

## 2013-07-16 MED ORDER — BISOPROLOL-HYDROCHLOROTHIAZIDE 5-6.25 MG PO TABS: 1.0000 | ORAL_TABLET | Freq: Every day | ORAL | Status: DC

## 2013-07-16 NOTE — Progress Notes (Signed)
Chief Complaint  Patient presents with  . Follow-up    HPI: Samantha Hinton  comes in today for follow up of  multiple medical problems.  From last first visit.  COPD  Changed to symbicort and   Samples out.  HT  Cost ? Bisoprolol  Or not .  Breast  Had calified  Area routine fuy MOOD about the same concern about se of meds   Nausea   Trying prilosec or nexium.    Phenergan no help no hb no vomiting obst sx   Overall lack of primary care and  acess to care  samples of advair 250 given in the short run steroid depo and 5 days of pred    Stop tobacco all together again  Refer for mammo Plan readdress HT at rov  Apparently was on bystolic and had se of ace i or ineffective.  Will update immunizations are returned. As appropriate  ROS: See pertinent positives and negatives per HPI.no fever syncope cost and issue of everything.trying to eat healthier for 2 months or so  ? burise easy taking asa qd 81 and sometime ibu   Past Medical History  Diagnosis Date  . COPD (chronic obstructive pulmonary disease)   . Hypertension   . Asthma   . Emphysema   . Hx of varicella   . Heartburn   . Genital warts   . High cholesterol   . Colon polyps   . 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   Marland Kitchen Hx: UTI (urinary tract infection)   . Pain management     hx of same in [place]  multiple controlled substances that she weaned off 4 years ago cand not any worse takes advil as bneeded     Family History  Problem Relation Age of Onset  . CAD Father     tumor between heart and lung   . CAD Sister     ? dx   . Hypertension Father   . Hypertension Mother   . Lung cancer Mother     died 45   . Diabetes Maternal Grandmother     late 51s     History   Social History  . Marital Status: Widowed    Spouse Name: N/A    Number of Children: 2  . Years of Education: N/A   Occupational History  . retired    Social History Main Topics  . Smoking status: Current Some Day Smoker -- 0.25 packs/day for 44  years    Types: Cigarettes  . Smokeless tobacco: Never Used  . Alcohol Use: 2.0 oz/week    4 drink(s) per week  . Drug Use: No  . Sexual Activity: None   Other Topics Concern  . None   Social History Narrative   6-8 hours of sleep per night   Lives with her fiance   1 dog in the home   Retired no ets  But tob 8 per day   etoh ocass 1-2    On disability from her neck surgery predicaments .   orig from [employer] in [place] area.    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 2   Last Pap 2006 last mammogram 2000 and   Has dentures   FA     Outpatient Encounter Prescriptions as of 07/16/2013  Medication Sig  . albuterol (PROVENTIL) (2.5 MG/3ML) 0.083% nebulizer solution Take 3 mLs (2.5 mg total) by nebulization every 6 (six)  hours as needed for wheezing.  Marland Kitchen aspirin 81 MG tablet Take 81 mg by mouth daily.  . cetirizine (ZYRTEC) 10 MG tablet Take 10 mg by mouth daily.  Marland Kitchen CINNAMON PO Take by mouth.  Drusilla Kanner EXTRACT PO Take 1 tablet by mouth daily.  . Esomeprazole Magnesium (NEXIUM PO) Take by mouth.  Marland Kitchen ipratropium (ATROVENT) 0.02 % nebulizer solution Take 2.5 mLs (500 mcg total) by nebulization 4 (four) times daily as needed for wheezing.  . Multiple Vitamins-Minerals (CENTRUM PO) Take by mouth daily.  Marland Kitchen albuterol (PROAIR HFA) 108 (90 BASE) MCG/ACT inhaler Inhale 2 puffs into the lungs every 4 (four) hours as needed for wheezing or shortness of breath. 2 puffs every 4 hours as needed only  if your can't catch your breath  . bisoprolol-hydrochlorothiazide (ZIAC) 5-6.25 MG per tablet Take 1 tablet by mouth daily.  . budesonide-formoterol (SYMBICORT) 160-4.5 MCG/ACT inhaler Take 2 puffs first thing in am and then another 2 puffs about 12 hours later.  . promethazine (PHENERGAN) 25 MG tablet Take 1 tablet (25 mg total) by mouth every 6 (six) hours as needed for nausea or vomiting.  . [DISCONTINUED] Ranitidine HCl (ZANTAC PO) Take 1 tablet by mouth 2 (two) times daily.      EXAM:  BP 186/100  Pulse 105  Temp(Src) 99.1 F (37.3 C) (Oral)  Ht 5\' 5"  (1.651 m)  Wt 195 lb (88.451 kg)  BMI 32.45 kg/m2  SpO2 97%  Body mass index is 32.45 kg/(m^2). bp confirmed  GENERAL: vitals reviewed and listed above, alert, oriented, appears well hydrated and in no acute distress HEENT: atraumatic, conjunctiva  clear, no obvious abnormalities on inspection of external nose and ears OP : no lesion edema or exudate  NECK: no obvious masses on inspection palpation  LUNGS:  Some inc work respiratino  No wheeze  CV: HRRR, no clubbing cyanosis or  peripheral edema nl cap refill  MS: moves all extremities without noticeable focal  Abnormality Skin few bruises left foot no petechia  PSYCH: pleasant and cooperative, Lab Results  Component Value Date   WBC 9.8 07/09/2013   HGB 14.4 07/09/2013   HCT 43.3 07/09/2013   PLT 204.0 07/09/2013   GLUCOSE 84 07/09/2013   CHOL 292* 07/09/2013   TRIG 218.0* 07/09/2013   HDL 77.00 07/09/2013   LDLCALC 171* 07/09/2013   ALT 31 07/09/2013   AST 23 07/09/2013   NA 141 07/09/2013   K 4.8 07/09/2013   CL 101 07/09/2013   CREATININE 0.8 07/09/2013   BUN 19 07/09/2013   CO2 31 07/09/2013   TSH 1.16 07/09/2013    ASSESSMENT AND PLAN:  Discussed the following assessment and plan:  Essential hypertension, benign - begin  med with diuretic  hx  uin past avoiding acei per dr Melvyn Novas fu 1-2 months or so   Adjustment disorder with anxious mood - pt feargul of se of controller meds consdier CBT therapy  Intentional underdosing of medication regimen by patient due to financial hardship  COPD GOLD II - stop tobacco sample of symbicort   Other and unspecified hyperlipidemia - cont lis consider meds cost an issue consider referrla nutritionetc   Need for vaccination with 13-polyvalent pneumococcal conjugate vaccine - Plan: Pneumococcal conjugate vaccine 13-valent  Smoker  Nausea alone - no better with phen trial nexium consdider gi referral ?  gastroparesis? ogther  -Patient advised to return or notify health care team  if symptoms worsen ,persist or new concerns arise.  Patient Instructions  Stop the asa temporarily bruising and nausea issues. Take the nexium   If continuing problem we can see GI Stop tobacco it is the negative not you. consdier counseling about the anxiety.  Begin bp medication as you have done well on  his in the past. return office visit in about 6-8 weeks   1-2 months  Or if bp not coming down earlier .   Cholesterol is elevated  We can consider medication  But poss nutrition referral also  .   Fat and Cholesterol Control Diet Fat and cholesterol levels in your blood and organs are influenced by your diet. High levels of fat and cholesterol may lead to diseases of the heart, small and large blood vessels, gallbladder, liver, and pancreas. CONTROLLING FAT AND CHOLESTEROL WITH DIET Although exercise and lifestyle factors are important, your diet is key. That is because certain foods are known to raise cholesterol and others to lower it. The goal is to balance foods for their effect on cholesterol and more importantly, to replace saturated and trans fat with other types of fat, such as monounsaturated fat, polyunsaturated fat, and omega-3 fatty acids. On average, a person should consume no more than 15 to 17 g of saturated fat daily. Saturated and trans fats are considered "bad" fats, and they will raise LDL cholesterol. Saturated fats are primarily found in animal products such as meats, butter, and cream. However, that does not mean you need to give up all your favorite foods. Today, there are good tasting, low-fat, low-cholesterol substitutes for most of the things you like to eat. Choose low-fat or nonfat alternatives. Choose round or loin cuts of red meat. These types of cuts are lowest in fat and cholesterol. Chicken (without the skin), fish, veal, and ground [place] breast are great choices. Eliminate fatty  meats, such as hot dogs and salami. Even shellfish have little or no saturated fat. Have a 3 oz (85 g) portion when you eat lean meat, poultry, or fish. Trans fats are also called "partially hydrogenated oils." They are oils that have been scientifically manipulated so that they are solid at room temperature resulting in a longer shelf life and improved taste and texture of foods in which they are added. Trans fats are found in stick margarine, some tub margarines, cookies, crackers, and baked goods.  When baking and cooking, oils are a great substitute for butter. The monounsaturated oils are especially beneficial since it is believed they lower LDL and raise HDL. The oils you should avoid entirely are saturated tropical oils, such as coconut and palm.  Remember to eat a lot from food groups that are naturally free of saturated and trans fat, including fish, fruit, vegetables, beans, grains (barley, rice, couscous, bulgur wheat), and pasta (without cream sauces).  IDENTIFYING FOODS THAT LOWER FAT AND CHOLESTEROL  Soluble fiber may lower your cholesterol. This type of fiber is found in fruits such as apples, vegetables such as broccoli, potatoes, and carrots, legumes such as beans, peas, and lentils, and grains such as barley. Foods fortified with plant sterols (phytosterol) may also lower cholesterol. You should eat at least 2 g per day of these foods for a cholesterol lowering effect.  Read package labels to identify low-saturated fats, trans fat free, and low-fat foods at the supermarket. Select cheeses that have only 2 to 3 g saturated fat per ounce. Use a heart-healthy tub margarine that is free of trans fats or partially hydrogenated oil. When buying baked goods (cookies, crackers), avoid partially  hydrogenated oils. Breads and muffins should be made from whole grains (whole-wheat or whole oat flour, instead of "flour" or "enriched flour"). Buy non-creamy canned soups with reduced salt and no added fats.   FOOD PREPARATION TECHNIQUES  Never deep-fry. If you must fry, either stir-fry, which uses very little fat, or use non-stick cooking sprays. When possible, broil, bake, or roast meats, and steam vegetables. Instead of putting butter or margarine on vegetables, use lemon and herbs, applesauce, and cinnamon (for squash and sweet potatoes). Use nonfat yogurt, salsa, and low-fat dressings for salads.  LOW-SATURATED FAT / LOW-FAT FOOD SUBSTITUTES Meats / Saturated Fat (g)  Avoid: Steak, marbled (3 oz/85 g) / 11 g  Choose: Steak, lean (3 oz/85 g) / 4 g  Avoid: Hamburger (3 oz/85 g) / 7 g  Choose: Hamburger, lean (3 oz/85 g) / 5 g  Avoid: Ham (3 oz/85 g) / 6 g  Choose: Ham, lean cut (3 oz/85 g) / 2.4 g  Avoid: Chicken, with skin, dark meat (3 oz/85 g) / 4 g  Choose: Chicken, skin removed, dark meat (3 oz/85 g) / 2 g  Avoid: Chicken, with skin, light meat (3 oz/85 g) / 2.5 g  Choose: Chicken, skin removed, light meat (3 oz/85 g) / 1 g Dairy / Saturated Fat (g)  Avoid: Whole milk (1 cup) / 5 g  Choose: Low-fat milk, 2% (1 cup) / 3 g  Choose: Low-fat milk, 1% (1 cup) / 1.5 g  Choose: Skim milk (1 cup) / 0.3 g  Avoid: Hard cheese (1 oz/28 g) / 6 g  Choose: Skim milk cheese (1 oz/28 g) / 2 to 3 g  Avoid: Cottage cheese, 4% fat (1 cup) / 6.5 g  Choose: Low-fat cottage cheese, 1% fat (1 cup) / 1.5 g  Avoid: Ice cream (1 cup) / 9 g  Choose: Sherbet (1 cup) / 2.5 g  Choose: Nonfat frozen yogurt (1 cup) / 0.3 g  Choose: Frozen fruit bar / trace  Avoid: Whipped cream (1 tbs) / 3.5 g  Choose: Nondairy whipped topping (1 tbs) / 1 g Condiments / Saturated Fat (g)  Avoid: Mayonnaise (1 tbs) / 2 g  Choose: Low-fat mayonnaise (1 tbs) / 1 g  Avoid: Butter (1 tbs) / 7 g  Choose: Extra light margarine (1 tbs) / 1 g  Avoid: Coconut oil (1 tbs) / 11.8 g  Choose: Olive oil (1 tbs) / 1.8 g  Choose: Corn oil (1 tbs) / 1.7 g  Choose: Safflower oil (1 tbs) / 1.2 g  Choose:  Sunflower oil (1 tbs) / 1.4 g  Choose: Soybean oil (1 tbs) / 2.4 g  Choose: Canola oil (1 tbs) / 1 g Document Released: 02/05/2005 Document Revised: 06/02/2012 Document Reviewed: 07/27/2010 ExitCare Patient Information 2014 [place], [place].        Standley Brooking. Arlee Bossard M.D.  Pre visit review using our clinic review tool, if applicable. No additional management support is needed unless otherwise documented below in the visit note.

## 2013-07-16 NOTE — Patient Instructions (Signed)
Stop the asa temporarily bruising and nausea issues. Take the nexium   If continuing problem we can see GI Stop tobacco it is the negative not you. consdier counseling about the anxiety.  Begin bp medication as you have done well on  his in the past. return office visit in about 6-8 weeks   1-2 months  Or if bp not coming down earlier .   Cholesterol is elevated  We can consider medication  But poss nutrition referral also  .   Fat and Cholesterol Control Diet Fat and cholesterol levels in your blood and organs are influenced by your diet. High levels of fat and cholesterol may lead to diseases of the heart, small and large blood vessels, gallbladder, liver, and pancreas. CONTROLLING FAT AND CHOLESTEROL WITH DIET Although exercise and lifestyle factors are important, your diet is key. That is because certain foods are known to raise cholesterol and others to lower it. The goal is to balance foods for their effect on cholesterol and more importantly, to replace saturated and trans fat with other types of fat, such as monounsaturated fat, polyunsaturated fat, and omega-3 fatty acids. On average, a person should consume no more than 15 to 17 g of saturated fat daily. Saturated and trans fats are considered "bad" fats, and they will raise LDL cholesterol. Saturated fats are primarily found in animal products such as meats, butter, and cream. However, that does not mean you need to give up all your favorite foods. Today, there are good tasting, low-fat, low-cholesterol substitutes for most of the things you like to eat. Choose low-fat or nonfat alternatives. Choose round or loin cuts of red meat. These types of cuts are lowest in fat and cholesterol. Chicken (without the skin), fish, veal, and ground [place] breast are great choices. Eliminate fatty meats, such as hot dogs and salami. Even shellfish have little or no saturated fat. Have a 3 oz (85 g) portion when you eat lean meat, poultry, or fish. Trans  fats are also called "partially hydrogenated oils." They are oils that have been scientifically manipulated so that they are solid at room temperature resulting in a longer shelf life and improved taste and texture of foods in which they are added. Trans fats are found in stick margarine, some tub margarines, cookies, crackers, and baked goods.  When baking and cooking, oils are a great substitute for butter. The monounsaturated oils are especially beneficial since it is believed they lower LDL and raise HDL. The oils you should avoid entirely are saturated tropical oils, such as coconut and palm.  Remember to eat a lot from food groups that are naturally free of saturated and trans fat, including fish, fruit, vegetables, beans, grains (barley, rice, couscous, bulgur wheat), and pasta (without cream sauces).  IDENTIFYING FOODS THAT LOWER FAT AND CHOLESTEROL  Soluble fiber may lower your cholesterol. This type of fiber is found in fruits such as apples, vegetables such as broccoli, potatoes, and carrots, legumes such as beans, peas, and lentils, and grains such as barley. Foods fortified with plant sterols (phytosterol) may also lower cholesterol. You should eat at least 2 g per day of these foods for a cholesterol lowering effect.  Read package labels to identify low-saturated fats, trans fat free, and low-fat foods at the supermarket. Select cheeses that have only 2 to 3 g saturated fat per ounce. Use a heart-healthy tub margarine that is free of trans fats or partially hydrogenated oil. When buying baked goods (cookies, crackers), avoid partially  hydrogenated oils. Breads and muffins should be made from whole grains (whole-wheat or whole oat flour, instead of "flour" or "enriched flour"). Buy non-creamy canned soups with reduced salt and no added fats.  FOOD PREPARATION TECHNIQUES  Never deep-fry. If you must fry, either stir-fry, which uses very little fat, or use non-stick cooking sprays. When possible,  broil, bake, or roast meats, and steam vegetables. Instead of putting butter or margarine on vegetables, use lemon and herbs, applesauce, and cinnamon (for squash and sweet potatoes). Use nonfat yogurt, salsa, and low-fat dressings for salads.  LOW-SATURATED FAT / LOW-FAT FOOD SUBSTITUTES Meats / Saturated Fat (g)  Avoid: Steak, marbled (3 oz/85 g) / 11 g  Choose: Steak, lean (3 oz/85 g) / 4 g  Avoid: Hamburger (3 oz/85 g) / 7 g  Choose: Hamburger, lean (3 oz/85 g) / 5 g  Avoid: Ham (3 oz/85 g) / 6 g  Choose: Ham, lean cut (3 oz/85 g) / 2.4 g  Avoid: Chicken, with skin, dark meat (3 oz/85 g) / 4 g  Choose: Chicken, skin removed, dark meat (3 oz/85 g) / 2 g  Avoid: Chicken, with skin, light meat (3 oz/85 g) / 2.5 g  Choose: Chicken, skin removed, light meat (3 oz/85 g) / 1 g Dairy / Saturated Fat (g)  Avoid: Whole milk (1 cup) / 5 g  Choose: Low-fat milk, 2% (1 cup) / 3 g  Choose: Low-fat milk, 1% (1 cup) / 1.5 g  Choose: Skim milk (1 cup) / 0.3 g  Avoid: Hard cheese (1 oz/28 g) / 6 g  Choose: Skim milk cheese (1 oz/28 g) / 2 to 3 g  Avoid: Cottage cheese, 4% fat (1 cup) / 6.5 g  Choose: Low-fat cottage cheese, 1% fat (1 cup) / 1.5 g  Avoid: Ice cream (1 cup) / 9 g  Choose: Sherbet (1 cup) / 2.5 g  Choose: Nonfat frozen yogurt (1 cup) / 0.3 g  Choose: Frozen fruit bar / trace  Avoid: Whipped cream (1 tbs) / 3.5 g  Choose: Nondairy whipped topping (1 tbs) / 1 g Condiments / Saturated Fat (g)  Avoid: Mayonnaise (1 tbs) / 2 g  Choose: Low-fat mayonnaise (1 tbs) / 1 g  Avoid: Butter (1 tbs) / 7 g  Choose: Extra light margarine (1 tbs) / 1 g  Avoid: Coconut oil (1 tbs) / 11.8 g  Choose: Olive oil (1 tbs) / 1.8 g  Choose: Corn oil (1 tbs) / 1.7 g  Choose: Safflower oil (1 tbs) / 1.2 g  Choose: Sunflower oil (1 tbs) / 1.4 g  Choose: Soybean oil (1 tbs) / 2.4 g  Choose: Canola oil (1 tbs) / 1 g Document Released: 02/05/2005 Document Revised:  06/02/2012 Document Reviewed: 07/27/2010 [employer] Patient Information 2014 [place], [place].

## 2013-08-28 ENCOUNTER — Encounter: Payer: Self-pay | Admitting: Internal Medicine

## 2013-08-28 ENCOUNTER — Ambulatory Visit (INDEPENDENT_AMBULATORY_CARE_PROVIDER_SITE_OTHER): Payer: Medicare HMO | Admitting: Internal Medicine

## 2013-08-28 VITALS — BP 138/74 | HR 86 | Temp 99.1°F | Ht 65.0 in | Wt 199.0 lb

## 2013-08-28 DIAGNOSIS — Z91120 Patient's intentional underdosing of medication regimen due to financial hardship: Secondary | ICD-10-CM

## 2013-08-28 DIAGNOSIS — Y639 Failure in dosage during unspecified surgical and medical care: Secondary | ICD-10-CM

## 2013-08-28 DIAGNOSIS — J449 Chronic obstructive pulmonary disease, unspecified: Secondary | ICD-10-CM

## 2013-08-28 DIAGNOSIS — E785 Hyperlipidemia, unspecified: Secondary | ICD-10-CM

## 2013-08-28 DIAGNOSIS — J441 Chronic obstructive pulmonary disease with (acute) exacerbation: Secondary | ICD-10-CM

## 2013-08-28 DIAGNOSIS — R110 Nausea: Secondary | ICD-10-CM

## 2013-08-28 DIAGNOSIS — I10 Essential (primary) hypertension: Secondary | ICD-10-CM

## 2013-08-28 DIAGNOSIS — F17200 Nicotine dependence, unspecified, uncomplicated: Secondary | ICD-10-CM

## 2013-08-28 LAB — BASIC METABOLIC PANEL
BUN: 17 mg/dL (ref 6–23)
CO2: 29 mEq/L (ref 19–32)
Calcium: 9.6 mg/dL (ref 8.4–10.5)
Chloride: 99 mEq/L (ref 96–112)
Creatinine, Ser: 0.9 mg/dL (ref 0.4–1.2)
GFR: 70.2 mL/min (ref >60.00)
GLUCOSE: 98 mg/dL (ref 70–99)
POTASSIUM: 4.5 meq/L (ref 3.5–5.1)
SODIUM: 140 meq/L (ref 135–145)

## 2013-08-28 LAB — MAGNESIUM: MAGNESIUM: 1.8 mg/dL (ref 1.5–2.5)

## 2013-08-28 MED ORDER — METHYLPREDNISOLONE ACETATE 80 MG/ML IJ SUSP
80.0000 mg | Freq: Once | INTRAMUSCULAR | Status: AC
  Administered 2013-08-28: 80 mg via INTRAMUSCULAR

## 2013-08-28 MED ORDER — AZITHROMYCIN 250 MG PO TABS: 250.0000 mg | ORAL_TABLET | ORAL | Status: DC

## 2013-08-28 MED ORDER — PREDNISONE 20 MG PO TABS: ORAL_TABLET | ORAL | Status: DC

## 2013-08-28 NOTE — Progress Notes (Signed)
Pre visit review using our clinic review tool, if applicable. No additional management support is needed unless otherwise documented below in the visit note.  Chief Complaint  Patient presents with  . Follow-up    HPI: Pt fu new to our practice in spring with multiple medical issues including copd fionfancial hardship hypertension hyperlipidemia , anxiety   At last visit restarted medication used in past for her ht per dr Melvyn Novas    BP Med ok crampiness  Some but ok   Breathing  ? If summer cold  . Using proair  About 4 x per day last week.   Cough taste not coming.  Poss brown little bit baseline   Tobacco  Still smoking . Think some of her shortness of breath is getting worse.  Followup cholesterol.  Added Nexium and did other measures and still has significant nausea but no vomiting otherwise no change in bowel habits. ROS: See pertinent positives and negatives per HPI. Nausea no fever chills weight loss hemoptysis flank pain. No weight loss Tobacco   Past Medical History  Diagnosis Date  . COPD (chronic obstructive pulmonary disease)   . Hypertension   . Asthma   . Emphysema   . Hx of varicella   . Heartburn   . Genital warts   . High cholesterol   . Colon polyps   . 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   Marland Kitchen Hx: UTI (urinary tract infection)   . Pain management     hx of same in [place]  multiple controlled substances that she weaned off 4 years ago cand not any worse takes advil as bneeded     Family History  Problem Relation Age of Onset  . CAD Father     tumor between heart and lung   . CAD Sister     ? dx   . Hypertension Father   . Hypertension Mother   . Lung cancer Mother     died 80   . Diabetes Maternal Grandmother     late 32s     History   Social History  . Marital Status: Widowed    Spouse Name: N/A    Number of Children: 2  . Years of Education: N/A   Occupational History  . retired    Social History Main Topics  . Smoking status: Current Some  Day Smoker -- 0.25 packs/day for 44 years    Types: Cigarettes  . Smokeless tobacco: Never Used  . Alcohol Use: 2.0 oz/week    4 drink(s) per week  . Drug Use: No  . Sexual Activity: None   Other Topics Concern  . None   Social History Narrative   6-8 hours of sleep per night   Lives with her fiance   1 dog in the home   Retired no ets  But tob 8 per day   etoh ocass 1-2    On disability from her neck surgery predicaments .   orig from [employer] in [place] area.    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 2   Last Pap 2006 last mammogram 2000 and   Has dentures   FA     Outpatient Encounter Prescriptions as of 08/28/2013  Medication Sig  . albuterol (PROAIR HFA) 108 (90 BASE) MCG/ACT inhaler Inhale 2 puffs into the lungs every 4 (four) hours as needed for wheezing or shortness of breath. 2 puffs every 4 hours as needed only  if your can't catch your breath  . albuterol (PROVENTIL) (2.5 MG/3ML) 0.083% nebulizer solution Take 3 mLs (2.5 mg total) by nebulization every 6 (six) hours as needed for wheezing.  Marland Kitchen aspirin 81 MG tablet Take 81 mg by mouth daily.  . bisoprolol-hydrochlorothiazide (ZIAC) 5-6.25 MG per tablet Take 1 tablet by mouth daily.  . budesonide-formoterol (SYMBICORT) 160-4.5 MCG/ACT inhaler Take 2 puffs first thing in am and then another 2 puffs about 12 hours later.  . cetirizine (ZYRTEC) 10 MG tablet Take 10 mg by mouth daily.  Marland Kitchen CINNAMON PO Take by mouth.  Drusilla Kanner EXTRACT PO Take 1 tablet by mouth daily.  . Esomeprazole Magnesium (NEXIUM PO) Take by mouth.  Marland Kitchen ipratropium (ATROVENT) 0.02 % nebulizer solution Take 2.5 mLs (500 mcg total) by nebulization 4 (four) times daily as needed for wheezing.  . Multiple Vitamins-Minerals (CENTRUM PO) Take by mouth daily.  Marland Kitchen azithromycin (ZITHROMAX Z-PAK) 250 MG tablet Take 1 tablet (250 mg total) by mouth as directed. Take 2 po first day, then 1 po qd if infected sputum  . predniSONE (DELTASONE) 20 MG  tablet Take 3 po qd for 2 days then 2 po qd for 3 days,or as directed  . [DISCONTINUED] promethazine (PHENERGAN) 25 MG tablet Take 1 tablet (25 mg total) by mouth every 6 (six) hours as needed for nausea or vomiting.  . [EXPIRED] methylPREDNISolone acetate (DEPO-MEDROL) injection 80 mg     EXAM:  BP 138/74  Pulse 86  Temp(Src) 99.1 F (37.3 C) (Oral)  Ht 5\' 5"  (1.651 m)  Wt 199 lb (90.266 kg)  BMI 33.12 kg/m2  SpO2 96%  Body mass index is 33.12 kg/(m^2). Repeat bp 138/74 some pulses  GENERAL: vitals reviewed and listed above, alert, oriented, appears well hydrated and in no acute distress mildly dyspneic good speech slightly hoarse HEENT: atraumatic, conjunctiva  clear, no obvious abnormalities on inspection of external nose and ears  NECK: no obvious masses on inspection palpation  LUNGS:  Wheezing bs = speech normal dry cough no stridor  CV: HRRR, no clubbing cyanosis or  peripheral edema nl cap refill  MS: moves all extremities without noticeable focal  abnormality PSYCH: pleasant and cooperative, Lab Results  Component Value Date   WBC 9.8 07/09/2013   HGB 14.4 07/09/2013   HCT 43.3 07/09/2013   PLT 204.0 07/09/2013   GLUCOSE 98 08/28/2013   CHOL 292* 07/09/2013   TRIG 218.0* 07/09/2013   HDL 77.00 07/09/2013   LDLCALC 171* 07/09/2013   ALT 31 07/09/2013   AST 23 07/09/2013   NA 140 08/28/2013   K 4.5 08/28/2013   CL 99 08/28/2013   CREATININE 0.9 08/28/2013   BUN 17 08/28/2013   CO2 29 08/28/2013   TSH 1.16 07/09/2013    ASSESSMENT AND PLAN:  Discussed the following assessment and plan:  Essential hypertension, benign - better  continue  some cramps hoefully not effecting pulm sx - Plan: Basic metabolic panel, Magnesium  Intentional underdosing of medication regimen by patient due to financial hardship  COPD mixed type  Smoker - hard time stopping. counseling  Nausea alone - not better - Plan: Ambulatory referral to Gastroenterology  COPD with acute exacerbation - new  - Plan: methylPREDNISolone acetate (DEPO-MEDROL) injection 80 mg  COPD exacerbation  Other and unspecified hyperlipidemia - lsi hold on med intervention until stable  -Patient advised to return or notify health care team  if symptoms worsen ,persist or new concerns arise.  Patient Instructions  dont give  up stopping tobacco  This acts like a flare of your lung disease.  Hard to treat if continuing to smoke  Advise steroid  Burst.  Antibiotic may only be helpful if  Getting  Infected looking  Phlegm  Lab to day to check potassium  Your cholesterol is elevated and we can add medication but would wait until better from this illness.  We can get gi involved to evaluate the continued nausea.   Standley Brooking. Annaly Skop M.D.

## 2013-08-28 NOTE — Patient Instructions (Signed)
dont give up stopping tobacco  This acts like a flare of your lung disease.  Hard to treat if continuing to smoke  Advise steroid  Burst.  Antibiotic may only be helpful if  Getting  Infected looking  Phlegm  Lab to day to check potassium  Your cholesterol is elevated and we can add medication but would wait until better from this illness.  We can get gi involved to evaluate the continued nausea.

## 2013-09-04 ENCOUNTER — Encounter: Payer: Self-pay | Admitting: Family Medicine

## 2013-10-06 ENCOUNTER — Encounter: Payer: Self-pay | Admitting: Internal Medicine

## 2013-12-01 ENCOUNTER — Ambulatory Visit (INDEPENDENT_AMBULATORY_CARE_PROVIDER_SITE_OTHER): Payer: Medicare HMO | Admitting: Internal Medicine

## 2013-12-01 ENCOUNTER — Encounter: Payer: Self-pay | Admitting: Internal Medicine

## 2013-12-01 VITALS — BP 132/70 | HR 89 | Temp 98.8°F | Ht 65.0 in | Wt 174.6 lb

## 2013-12-01 DIAGNOSIS — F17200 Nicotine dependence, unspecified, uncomplicated: Secondary | ICD-10-CM

## 2013-12-01 DIAGNOSIS — J019 Acute sinusitis, unspecified: Secondary | ICD-10-CM

## 2013-12-01 DIAGNOSIS — J441 Chronic obstructive pulmonary disease with (acute) exacerbation: Secondary | ICD-10-CM

## 2013-12-01 DIAGNOSIS — J449 Chronic obstructive pulmonary disease, unspecified: Secondary | ICD-10-CM

## 2013-12-01 DIAGNOSIS — I10 Essential (primary) hypertension: Secondary | ICD-10-CM

## 2013-12-01 DIAGNOSIS — E785 Hyperlipidemia, unspecified: Secondary | ICD-10-CM | POA: Insufficient documentation

## 2013-12-01 DIAGNOSIS — Z720 Tobacco use: Secondary | ICD-10-CM

## 2013-12-01 DIAGNOSIS — Z8249 Family history of ischemic heart disease and other diseases of the circulatory system: Secondary | ICD-10-CM | POA: Insufficient documentation

## 2013-12-01 DIAGNOSIS — Z23 Encounter for immunization: Secondary | ICD-10-CM

## 2013-12-01 DIAGNOSIS — Z801 Family history of malignant neoplasm of trachea, bronchus and lung: Secondary | ICD-10-CM | POA: Insufficient documentation

## 2013-12-01 LAB — LIPID PANEL
Cholesterol: 205 mg/dL — ABNORMAL HIGH (ref 0–200)
HDL: 31.1 mg/dL — AB (ref >39.00)
NONHDL: 173.9
TRIGLYCERIDES: 276 mg/dL — AB (ref 0.0–149.0)
Total CHOL/HDL Ratio: 7
VLDL: 55.2 mg/dL — AB (ref 0.0–40.0)

## 2013-12-01 LAB — LDL CHOLESTEROL, DIRECT: Direct LDL: 122.8 mg/dL

## 2013-12-01 MED ORDER — AMOXICILLIN-POT CLAVULANATE 875-125 MG PO TABS: 1.0000 | ORAL_TABLET | Freq: Two times a day (BID) | ORAL | Status: DC

## 2013-12-01 MED ORDER — PREDNISONE 20 MG PO TABS: ORAL_TABLET | ORAL | Status: DC

## 2013-12-01 NOTE — Patient Instructions (Addendum)
Continue  Healthy eating and weight control. Will notify you  of labs when available.lipids   Last readings would advise medication  To decrease risk of heart attack and stroke.  pred and infection   Sinus and lungs for now. Check bp readings periodically to be sure below 140/90.   ROV 6 months wellness visit  Please stop tobacco.

## 2013-12-01 NOTE — Progress Notes (Signed)
Pre visit review using our clinic review tool, if applicable. No additional management support is needed unless otherwise documented below in the visit note.  Chief Complaint  Patient presents with  . Follow-up    HPI: Samantha Hinton is a 58 y.o. fu bp lipids lungs  Has had coughing illness for 10 days  Takes care of grand kids 1 and 3 year who go to church day care  1ppd.  No fever.    Tight cough  Sx for 10 day s. Facial sinus congestion some discolored  Rescue med  Bid and now 4 x per day.  With this illness gets coughing fits bid  bp: uncertain   Not checking  Sick today .  Taking med  Weight and lipids :Has seen  Nutrition   P[rogram  And  Losing weight  Eating clean and losing weight  .   No etoh.  ROS: See pertinent positives and negatives per HPI. No fever hemoptysis   Past Medical History  Diagnosis Date  . COPD (chronic obstructive pulmonary disease)   . Hypertension   . Asthma   . Emphysema   . Hx of varicella   . Heartburn   . Genital warts   . High cholesterol   . Colon polyps   . 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   Marland Kitchen Hx: UTI (urinary tract infection)   . Pain management     hx of same in [place]  multiple controlled substances that she weaned off 4 years ago cand not any worse takes advil as bneeded     Family History  Problem Relation Age of Onset  . CAD Father     tumor between heart and lung   . CAD Sister     ? dx   . Hypertension Father   . Hypertension Mother   . Lung cancer Mother     died 66   . Diabetes Maternal Grandmother     late 41s     History   Social History  . Marital Status: Widowed    Spouse Name: N/A    Number of Children: 2  . Years of Education: N/A   Occupational History  . retired    Social History Main Topics  . Smoking status: Current Some Day Smoker -- 0.25 packs/day for 44 years    Types: Cigarettes  . Smokeless tobacco: Never Used  . Alcohol Use: 2.0 oz/week    4 drink(s) per week  . Drug Use: No  . Sexual  Activity: None   Other Topics Concern  . None   Social History Narrative   6-8 hours of sleep per night   Lives with her fiance   1 dog in the home   Retired no ets  But tob 8 per day   etoh ocass 1-2    On disability from her neck surgery predicaments .   orig from [employer] in [place] area.    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 2   Last Pap 2006 last mammogram 2000 and   Has dentures   FA     Outpatient Encounter Prescriptions as of 12/01/2013  Medication Sig  . albuterol (PROAIR HFA) 108 (90 BASE) MCG/ACT inhaler Inhale 2 puffs into the lungs every 4 (four) hours as needed for wheezing or shortness of breath. 2 puffs every 4 hours as needed only  if your can't catch your breath  . albuterol (PROVENTIL) (2.5 MG/3ML)  0.083% nebulizer solution Take 3 mLs (2.5 mg total) by nebulization every 6 (six) hours as needed for wheezing.  Marland Kitchen azithromycin (ZITHROMAX Z-PAK) 250 MG tablet Take 1 tablet (250 mg total) by mouth as directed. Take 2 po first day, then 1 po qd if infected sputum  . bisoprolol-hydrochlorothiazide (ZIAC) 5-6.25 MG per tablet Take 1 tablet by mouth daily.  . budesonide-formoterol (SYMBICORT) 160-4.5 MCG/ACT inhaler Take 2 puffs first thing in am and then another 2 puffs about 12 hours later.  Marland Kitchen CINNAMON PO Take by mouth.  Marland Kitchen ipratropium (ATROVENT) 0.02 % nebulizer solution Take 2.5 mLs (500 mcg total) by nebulization 4 (four) times daily as needed for wheezing.  . Multiple Vitamins-Minerals (CENTRUM PO) Take by mouth daily.  Marland Kitchen amoxicillin-clavulanate (AUGMENTIN) 875-125 MG per tablet Take 1 tablet by mouth every 12 (twelve) hours. For sinusitis  . predniSONE (DELTASONE) 20 MG tablet Take 3 po qd for 2 days then 2 po qd for 3 days,or as directed  . [DISCONTINUED] aspirin 81 MG tablet Take 81 mg by mouth daily.  . [DISCONTINUED] cetirizine (ZYRTEC) 10 MG tablet Take 10 mg by mouth daily.  . [DISCONTINUED] CRANBERRY EXTRACT PO Take 1 tablet by mouth  daily.  . [DISCONTINUED] Esomeprazole Magnesium (NEXIUM PO) Take by mouth.  . [DISCONTINUED] predniSONE (DELTASONE) 20 MG tablet Take 3 po qd for 2 days then 2 po qd for 3 days,or as directed    EXAM:  BP 132/70  Pulse 89  Temp(Src) 98.8 F (37.1 C) (Oral)  Ht 5\' 5"  (1.651 m)  Wt 174 lb 9.6 oz (79.198 kg)  BMI 29.05 kg/m2  SpO2 93%  Body mass index is 29.05 kg/(m^2).  GENERAL: vitals reviewed and listed above, alert, oriented, appears well hydrated and in no acute distress  Coughing fits dry and  Wheezy at times   HEENT: atraumatic, conjunctiva  clear, no obvious abnormalities on inspection of external nose and ears nares congested tmx clear  Facial tenderness bilateralluy OP : no lesion edema or exudate  Yellow pnd NECK: no obvious masses on inspection palpation  LUNGS:  Wheezing  Bases no rales dec air movement but adequate and quiet rep in between coughing fits  CV: HRRR, no clubbing cyanosis or  peripheral edema nl cap refill  MS: moves all extremities without noticeable focal  abnormality PSYCH: pleasant and cooperative, no obvious depression or anxiety Lab Results  Component Value Date   WBC 9.8 07/09/2013   HGB 14.4 07/09/2013   HCT 43.3 07/09/2013   PLT 204.0 07/09/2013   GLUCOSE 98 08/28/2013   CHOL 292* 07/09/2013   TRIG 218.0* 07/09/2013   HDL 77.00 07/09/2013   LDLCALC 171* 07/09/2013   ALT 31 07/09/2013   AST 23 07/09/2013   NA 140 08/28/2013   K 4.5 08/28/2013   CL 99 08/28/2013   CREATININE 0.9 08/28/2013   BUN 17 08/28/2013   CO2 29 08/28/2013   TSH 1.16 07/09/2013   BP Readings from Last 3 Encounters:  12/01/13 132/70  08/28/13 138/74  07/16/13 186/100    ASSESSMENT AND PLAN:  Discussed the following assessment and plan:  Essential hypertension, benign  Smoker  COPD GOLD II  Hyperlipidemia - 2013 9.9 %risk  - Plan: Lipid panel  Need for prophylactic vaccination and inoculation against influenza - Plan: Flu Vaccine QUAD 36+ mos PF IM (Fluarix Quad  PF)  COPD with acute exacerbation  Acute sinusitis with symptoms greater than 10 days  Family history of lung cancer  Family history  of heart disease mom died lung cancer   Father heart.   High risk and pt aware  Working on it    chantix gave nightmare in past and cost may be an issue knows what she needs to do . -Patient advised to return or notify health care team  if symptoms worsen ,persist or new concerns arise. Wt Readings from Last 3 Encounters:  12/01/13 174 lb 9.6 oz (79.198 kg)  08/28/13 199 lb (90.266 kg)  07/16/13 195 lb (88.451 kg)   Coffee  And coconut  milk   And breath mints .  This am  Patient Instructions  Continue  Healthy eating and weight control. Will notify you  of labs when available.lipids   Last readings would advise medication  To decrease risk of heart attack and stroke.  pred and infection   Sinus and lungs for now. Check bp readings periodically to be sure below 140/90.   ROV 6 months wellness visit  Please stop tobacco.     Standley Brooking. Panosh M.D.

## 2013-12-15 ENCOUNTER — Other Ambulatory Visit: Payer: Self-pay | Admitting: Family Medicine

## 2013-12-15 MED ORDER — LOVASTATIN 40 MG PO TABS: 40.0000 mg | ORAL_TABLET | Freq: Every day | ORAL | Status: DC

## 2013-12-21 ENCOUNTER — Encounter: Payer: Self-pay | Admitting: Internal Medicine

## 2014-01-19 DIAGNOSIS — Z8709 Personal history of other diseases of the respiratory system: Secondary | ICD-10-CM

## 2014-01-19 HISTORY — DX: Personal history of other diseases of the respiratory system: Z87.09

## 2014-02-01 ENCOUNTER — Other Ambulatory Visit: Payer: Self-pay | Admitting: Internal Medicine

## 2014-02-01 NOTE — Telephone Encounter (Signed)
Sent to the pharmacy by e-scribe.  Pt has upcoming CPX scheduled for 05/24/14

## 2014-02-08 ENCOUNTER — Encounter: Payer: Self-pay | Admitting: Internal Medicine

## 2014-02-08 ENCOUNTER — Ambulatory Visit (INDEPENDENT_AMBULATORY_CARE_PROVIDER_SITE_OTHER): Payer: Medicare HMO | Admitting: Internal Medicine

## 2014-02-08 VITALS — BP 100/64 | HR 96 | Temp 98.2°F | Resp 22 | Ht 65.0 in | Wt 180.0 lb

## 2014-02-08 DIAGNOSIS — J069 Acute upper respiratory infection, unspecified: Secondary | ICD-10-CM

## 2014-02-08 DIAGNOSIS — Z8249 Family history of ischemic heart disease and other diseases of the circulatory system: Secondary | ICD-10-CM

## 2014-02-08 DIAGNOSIS — J441 Chronic obstructive pulmonary disease with (acute) exacerbation: Secondary | ICD-10-CM

## 2014-02-08 MED ORDER — AZITHROMYCIN 250 MG PO TABS: ORAL_TABLET | ORAL | Status: DC

## 2014-02-08 MED ORDER — PROMETHAZINE HCL 12.5 MG PO TABS: 12.5000 mg | ORAL_TABLET | Freq: Three times a day (TID) | ORAL | Status: DC | PRN

## 2014-02-08 MED ORDER — IPRATROPIUM BROMIDE 0.02 % IN SOLN: 500.0000 ug | Freq: Four times a day (QID) | RESPIRATORY_TRACT | Status: DC | PRN

## 2014-02-08 MED ORDER — ALBUTEROL SULFATE (2.5 MG/3ML) 0.083% IN NEBU: 2.5000 mg | INHALATION_SOLUTION | Freq: Four times a day (QID) | RESPIRATORY_TRACT | Status: DC | PRN

## 2014-02-08 MED ORDER — PREDNISONE 20 MG PO TABS: ORAL_TABLET | ORAL | Status: DC

## 2014-02-08 MED ORDER — HYDROCODONE-HOMATROPINE 5-1.5 MG/5ML PO SYRP: 5.0000 mL | ORAL_SOLUTION | Freq: Four times a day (QID) | ORAL | Status: DC | PRN

## 2014-02-08 NOTE — Patient Instructions (Signed)
Smoking tobacco is very bad for your health. You should stop smoking immediately.  Chronic Obstructive Pulmonary Disease Exacerbation Chronic obstructive pulmonary disease (COPD) is a common lung condition in which airflow from the lungs is limited. COPD is a general term that can be used to describe many different lung problems that limit airflow, including chronic bronchitis and emphysema. COPD exacerbations are episodes when breathing symptoms become much worse and require extra treatment. Without treatment, COPD exacerbations can be life threatening, and frequent COPD exacerbations can cause further damage to your lungs. CAUSES   Respiratory infections.   Exposure to smoke.   Exposure to air pollution, chemical fumes, or dust. Sometimes there is no apparent cause or trigger. RISK FACTORS  Smoking cigarettes.  Older age.  Frequent prior COPD exacerbations. SIGNS AND SYMPTOMS   Increased coughing.   Increased thick spit (sputum) production.   Increased wheezing.   Increased shortness of breath.   Rapid breathing.   Chest tightness. DIAGNOSIS  Your medical history, a physical exam, and tests will help your health care provider make a diagnosis. Tests may include:  A chest X-ray.  Basic lab tests.  Sputum testing.  An arterial blood gas test. TREATMENT  Depending on the severity of your COPD exacerbation, you may need to be admitted to a hospital for treatment. Some of the treatments commonly used to treat COPD exacerbations are:   Antibiotic medicines.   Bronchodilators. These are drugs that expand the air passages. They may be given with an inhaler or nebulizer. Spacer devices may be needed to help improve drug delivery.  Corticosteroid medicines.  Supplemental oxygen therapy.  HOME CARE INSTRUCTIONS   Do not smoke. Quitting smoking is very important to prevent COPD from getting worse and exacerbations from happening as often.  Avoid exposure to all  substances that irritate the airway, especially to tobacco smoke.   If you were prescribed an antibiotic medicine, finish it all even if you start to feel better.  Take all medicines as directed by your health care provider.It is important to use correct technique with inhaled medicines.  Drink enough fluids to keep your urine clear or pale yellow (unless you have a medical condition that requires fluid restriction).  Use a cool mist vaporizer. This makes it easier to clear your chest when you cough.   If you have a home nebulizer and oxygen, continue to use them as directed.   Maintain all necessary vaccinations to prevent infections.   Exercise regularly.   Eat a healthy diet.   Keep all follow-up appointments as directed by your health care provider. SEEK IMMEDIATE MEDICAL CARE IF:  You have worsening shortness of breath.   You have trouble talking.   You have severe chest pain.  You have blood in your sputum.  You have a fever.  You have weakness, vomit repeatedly, or faint.   You feel confused.   You continue to get worse. MAKE SURE YOU:   Understand these instructions.  Will watch your condition.  Will get help right away if you are not doing well or get worse. Document Released: 12/03/2006 Document Revised: 06/22/2013 Document Reviewed: 10/10/2012 [facility] Patient Information 2015 [place], [place]. This information is not intended to replace advice given to you by your health care provider. Make sure you discuss any questions you have with your health care provider.

## 2014-02-08 NOTE — Progress Notes (Signed)
Subjective:    Patient ID: Samantha Hinton, female    DOB: 04/24/1955, 58 y.o.   MRN: [id number]  HPI 58 year old patient who has a history of COPD and ongoing tobacco use.  She presents with a five-day history of increasing cough, congestion, wheezing, fever, and general sense of unwellness.  She has had some associated nausea.  She has had productive cough yielding green and brown sputum production.  She has been compliant with both maintenance and rescue bronchodilators.  Stable medical problems include essential hypertension.  She continues to smoke but has decreased her smoking consumption.  Medical regimen also includes Atrovent via nebulizer solution.  This medication was refilled today  Past Medical History  Diagnosis Date  . COPD (chronic obstructive pulmonary disease)   . Hypertension   . Asthma   . Emphysema   . Hx of varicella   . Heartburn   . Genital warts   . High cholesterol   . Colon polyps   . 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   Marland Kitchen Hx: UTI (urinary tract infection)   . Pain management     hx of same in [place]  multiple controlled substances that she weaned off 4 years ago cand not any worse takes advil as bneeded     History   Social History  . Marital Status: Widowed    Spouse Name: N/A    Number of Children: 2  . Years of Education: N/A   Occupational History  . retired    Social History Main Topics  . Smoking status: Current Some Day Smoker -- 0.25 packs/day for 44 years    Types: Cigarettes  . Smokeless tobacco: Never Used  . Alcohol Use: 2.0 oz/week    4 drink(s) per week  . Drug Use: No  . Sexual Activity: Not on file   Other Topics Concern  . Not on file   Social History Narrative   6-8 hours of sleep per night   Lives with her fiance   1 dog in the home   Retired no ets  But tob 8 per day   etoh ocass 1-2    On disability from her neck surgery predicaments .   orig from [employer] in [place] area.    12+ years of education widowed  retired gravida 2 para 2   Last Pap 2006 last mammogram 2000 and   Has dentures   FA     Past Surgical History  Procedure Laterality Date  . Abdominal hysterectomy  1983     for fibroids  sitll has one ovary  age 60   . Cholecystectomy  1983  . Neck surgery  [phone]    c spine ant and post hx fusion and removal or harcdware and shavings  . Back surgery    . Appendectomy  1967  . Breast surgery      diseased milk glands  . Tonsillectomy      Family History  Problem Relation Age of Onset  . CAD Father     tumor between heart and lung   . CAD Sister     ? dx   . Hypertension Father   . Hypertension Mother   . Lung cancer Mother     died 66   . Diabetes Maternal Grandmother     late 13s     Allergies  Allergen Reactions  . Wellbutrin [Bupropion]     Sick to her stomach  Current Outpatient Prescriptions on File Prior to Visit  Medication Sig Dispense Refill  . albuterol (PROAIR HFA) 108 (90 BASE) MCG/ACT inhaler Inhale 2 puffs into the lungs every 4 (four) hours as needed for wheezing or shortness of breath. 2 puffs every 4 hours as needed only  if your can't catch your breath 1 Inhaler 11  . bisoprolol-hydrochlorothiazide (ZIAC) 5-6.25 MG per tablet TAKE ONE TABLET BY MOUTH ONCE DAILY 90 tablet 1  . budesonide-formoterol (SYMBICORT) 160-4.5 MCG/ACT inhaler Take 2 puffs first thing in am and then another 2 puffs about 12 hours later. 1 Inhaler 11  . CINNAMON PO Take by mouth.    . lovastatin (MEVACOR) 40 MG tablet Take 1 tablet (40 mg total) by mouth at bedtime. 90 tablet 1  . Multiple Vitamins-Minerals (CENTRUM PO) Take by mouth daily.     No current facility-administered medications on file prior to visit.    BP 100/64 mmHg  Pulse 96  Temp(Src) 98.2 F (36.8 C) (Oral)  Resp 22  Ht 5\' 5"  (1.651 m)  Wt 180 lb (81.647 kg)  BMI 29.95 kg/m2  SpO2 93%      Review of Systems  Constitutional: Positive for fever, activity change and appetite change.    HENT: Negative for congestion, dental problem, hearing loss, rhinorrhea, sinus pressure, sore throat and tinnitus.   Eyes: Negative for pain, discharge and visual disturbance.  Respiratory: Positive for cough, shortness of breath and wheezing.   Cardiovascular: Negative for chest pain, palpitations and leg swelling.  Gastrointestinal: Positive for nausea. Negative for vomiting, abdominal pain, diarrhea, constipation, blood in stool and abdominal distention.  Genitourinary: Negative for dysuria, urgency, frequency, hematuria, flank pain, vaginal bleeding, vaginal discharge, difficulty urinating, vaginal pain and pelvic pain.  Musculoskeletal: Negative for joint swelling, arthralgias and gait problem.  Skin: Negative for rash.  Neurological: Negative for dizziness, syncope, speech difficulty, weakness, numbness and headaches.  Hematological: Negative for adenopathy.  Psychiatric/Behavioral: Negative for behavioral problems, dysphoric mood and agitation. The patient is not nervous/anxious.        Object of the dayive:   Physical Exam  Constitutional: She is oriented to person, place, and time. She appears well-developed and well-nourished.  HENT:  Head: Normocephalic.  Right Ear: External ear normal.  Left Ear: External ear normal.  Mouth/Throat: Oropharynx is clear and moist.  Eyes: Conjunctivae and EOM are normal. Pupils are equal, round, and reactive to light.  Neck: Normal range of motion. Neck supple. No thyromegaly present.  Cardiovascular: Normal rate, regular rhythm, normal heart sounds and intact distal pulses.   Pulmonary/Chest: Effort normal.  Scattered coarse rhonchi and expiratory wheezing, left greater than right  Abdominal: Soft. Bowel sounds are normal. She exhibits no mass. There is no tenderness.  Musculoskeletal: Normal range of motion.  Lymphadenopathy:    She has no cervical adenopathy.  Neurological: She is alert and oriented to person, place, and time.  Skin:  Skin is warm and dry. No rash noted.  Psychiatric: She has a normal mood and affect. Her behavior is normal.          Assessment & Plan:    exacerbation COPD  Hypertension  Ongoing tobacco use   We'll treat with a prednisone taper, antibiotics expectorants.  Total smoking cessation encouraged.  She will report any clinical worsening.

## 2014-02-08 NOTE — Progress Notes (Signed)
Pre visit review using our clinic review tool, if applicable. No additional management support is needed unless otherwise documented below in the visit note. 

## 2014-02-11 ENCOUNTER — Emergency Department (HOSPITAL_COMMUNITY): Payer: Medicare HMO

## 2014-02-11 ENCOUNTER — Inpatient Hospital Stay (HOSPITAL_COMMUNITY)
Admission: EM | Admit: 2014-02-11 | Discharge: 2014-02-15 | DRG: 192 | Disposition: A | Discharge status: Home / Self Care | Payer: Medicare HMO | Attending: Internal Medicine | Admitting: Internal Medicine

## 2014-02-11 ENCOUNTER — Encounter (HOSPITAL_COMMUNITY): Payer: Self-pay | Admitting: *Deleted

## 2014-02-11 DIAGNOSIS — Z86010 Personal history of colonic polyps: Secondary | ICD-10-CM

## 2014-02-11 DIAGNOSIS — J45909 Unspecified asthma, uncomplicated: Secondary | ICD-10-CM | POA: Diagnosis present

## 2014-02-11 DIAGNOSIS — E780 Pure hypercholesterolemia: Secondary | ICD-10-CM | POA: Diagnosis present

## 2014-02-11 DIAGNOSIS — I10 Essential (primary) hypertension: Secondary | ICD-10-CM | POA: Diagnosis present

## 2014-02-11 DIAGNOSIS — Z888 Allergy status to other drugs, medicaments and biological substances status: Secondary | ICD-10-CM

## 2014-02-11 DIAGNOSIS — R0902 Hypoxemia: Secondary | ICD-10-CM | POA: Diagnosis present

## 2014-02-11 DIAGNOSIS — B370 Candidal stomatitis: Secondary | ICD-10-CM | POA: Clinically undetermined

## 2014-02-11 DIAGNOSIS — J441 Chronic obstructive pulmonary disease with (acute) exacerbation: Secondary | ICD-10-CM | POA: Diagnosis present

## 2014-02-11 DIAGNOSIS — F17210 Nicotine dependence, cigarettes, uncomplicated: Secondary | ICD-10-CM | POA: Diagnosis present

## 2014-02-11 DIAGNOSIS — Z8673 Personal history of transient ischemic attack (TIA), and cerebral infarction without residual deficits: Secondary | ICD-10-CM

## 2014-02-11 DIAGNOSIS — Z9049 Acquired absence of other specified parts of digestive tract: Secondary | ICD-10-CM | POA: Diagnosis present

## 2014-02-11 DIAGNOSIS — Z90710 Acquired absence of both cervix and uterus: Secondary | ICD-10-CM | POA: Diagnosis not present

## 2014-02-11 DIAGNOSIS — Z87440 Personal history of urinary (tract) infections: Secondary | ICD-10-CM | POA: Diagnosis not present

## 2014-02-11 DIAGNOSIS — T380X5A Adverse effect of glucocorticoids and synthetic analogues, initial encounter: Secondary | ICD-10-CM | POA: Diagnosis present

## 2014-02-11 DIAGNOSIS — J449 Chronic obstructive pulmonary disease, unspecified: Secondary | ICD-10-CM | POA: Diagnosis present

## 2014-02-11 DIAGNOSIS — R0609 Other forms of dyspnea: Secondary | ICD-10-CM | POA: Diagnosis present

## 2014-02-11 DIAGNOSIS — J019 Acute sinusitis, unspecified: Secondary | ICD-10-CM | POA: Diagnosis present

## 2014-02-11 DIAGNOSIS — E785 Hyperlipidemia, unspecified: Secondary | ICD-10-CM | POA: Diagnosis present

## 2014-02-11 DIAGNOSIS — A419 Sepsis, unspecified organism: Secondary | ICD-10-CM

## 2014-02-11 DIAGNOSIS — R6510 Systemic inflammatory response syndrome (SIRS) of non-infectious origin without acute organ dysfunction: Secondary | ICD-10-CM | POA: Diagnosis present

## 2014-02-11 DIAGNOSIS — R0602 Shortness of breath: Secondary | ICD-10-CM

## 2014-02-11 DIAGNOSIS — B379 Candidiasis, unspecified: Secondary | ICD-10-CM | POA: Diagnosis present

## 2014-02-11 LAB — CBC
HCT: 37.7 % (ref 36.0–46.0)
Hemoglobin: 12.5 g/dL (ref 12.0–15.0)
MCH: 29.8 pg (ref 26.0–34.0)
MCHC: 33.2 g/dL (ref 30.0–36.0)
MCV: 90 fL (ref 78.0–100.0)
PLATELETS: 330 10*3/uL (ref 150–400)
RBC: 4.19 MIL/uL (ref 3.87–5.11)
RDW: 14.5 % (ref 11.5–15.5)
WBC: 18.2 10*3/uL — AB (ref 4.0–10.5)

## 2014-02-11 LAB — INFLUENZA PANEL BY PCR (TYPE A & B)
H1N1FLUPCR: NOT DETECTED
Influenza A By PCR: NEGATIVE
Influenza B By PCR: NEGATIVE

## 2014-02-11 LAB — BASIC METABOLIC PANEL
ANION GAP: 11 (ref 5–15)
BUN: 15 mg/dL (ref 6–23)
CO2: 29 mmol/L (ref 19–32)
CREATININE: 0.86 mg/dL (ref 0.50–1.10)
Calcium: 8.9 mg/dL (ref 8.4–10.5)
Chloride: 94 mEq/L — ABNORMAL LOW (ref 96–112)
GFR calc Af Amer: 85 mL/min — ABNORMAL LOW (ref >90)
GFR calc non Af Amer: 73 mL/min — ABNORMAL LOW (ref >90)
GLUCOSE: 96 mg/dL (ref 70–99)
Potassium: 3.1 mmol/L — ABNORMAL LOW (ref 3.5–5.1)
Sodium: 134 mmol/L — ABNORMAL LOW (ref 135–145)

## 2014-02-11 LAB — BRAIN NATRIURETIC PEPTIDE: B Natriuretic Peptide: 53.8 pg/mL (ref 0.0–100.0)

## 2014-02-11 LAB — I-STAT TROPONIN, ED: Troponin i, poc: 0.01 ng/mL (ref 0.00–0.08)

## 2014-02-11 MED ORDER — BISOPROLOL-HYDROCHLOROTHIAZIDE 5-6.25 MG PO TABS
1.0000 | ORAL_TABLET | Freq: Every day | ORAL | Status: DC
  Administered 2014-02-11 – 2014-02-15 (×5): 1 via ORAL
  Filled 2014-02-11 (×5): qty 1

## 2014-02-11 MED ORDER — IPRATROPIUM-ALBUTEROL 0.5-2.5 (3) MG/3ML IN SOLN: 3.0000 mL | RESPIRATORY_TRACT | Status: DC

## 2014-02-11 MED ORDER — PIPERACILLIN-TAZOBACTAM 3.375 G IVPB
3.3750 g | Freq: Three times a day (TID) | INTRAVENOUS | Status: DC
  Administered 2014-02-11 – 2014-02-14 (×10): 3.375 g via INTRAVENOUS
  Filled 2014-02-11 (×13): qty 50

## 2014-02-11 MED ORDER — IPRATROPIUM-ALBUTEROL 0.5-2.5 (3) MG/3ML IN SOLN
3.0000 mL | RESPIRATORY_TRACT | Status: DC
  Administered 2014-02-11 – 2014-02-12 (×10): 3 mL via RESPIRATORY_TRACT
  Filled 2014-02-11 (×10): qty 3

## 2014-02-11 MED ORDER — HYDROCODONE-HOMATROPINE 5-1.5 MG/5ML PO SYRP
5.0000 mL | ORAL_SOLUTION | Freq: Four times a day (QID) | ORAL | Status: DC | PRN
  Administered 2014-02-11 – 2014-02-14 (×6): 5 mL via ORAL
  Filled 2014-02-11 (×6): qty 5

## 2014-02-11 MED ORDER — CETYLPYRIDINIUM CHLORIDE 0.05 % MT LIQD
7.0000 mL | Freq: Two times a day (BID) | OROMUCOSAL | Status: DC
  Administered 2014-02-12 – 2014-02-14 (×6): 7 mL via OROMUCOSAL

## 2014-02-11 MED ORDER — ONDANSETRON HCL 4 MG/2ML IJ SOLN
4.0000 mg | Freq: Four times a day (QID) | INTRAMUSCULAR | Status: DC | PRN
  Administered 2014-02-11 – 2014-02-14 (×7): 4 mg via INTRAVENOUS
  Filled 2014-02-11 (×6): qty 2

## 2014-02-11 MED ORDER — SODIUM CHLORIDE 0.9 % IJ SOLN
3.0000 mL | Freq: Two times a day (BID) | INTRAMUSCULAR | Status: DC
  Administered 2014-02-11 – 2014-02-15 (×8): 3 mL via INTRAVENOUS

## 2014-02-11 MED ORDER — SODIUM CHLORIDE 0.9 % IV BOLUS (SEPSIS): 500.0000 mL | Freq: Once | INTRAVENOUS | Status: DC

## 2014-02-11 MED ORDER — METHYLPREDNISOLONE SODIUM SUCC 125 MG IJ SOLR
125.0000 mg | Freq: Once | INTRAMUSCULAR | Status: AC
  Administered 2014-02-11: 125 mg via INTRAVENOUS
  Filled 2014-02-11: qty 2

## 2014-02-11 MED ORDER — ACETAMINOPHEN 325 MG PO TABS: 650.0000 mg | ORAL_TABLET | Freq: Four times a day (QID) | ORAL | Status: DC | PRN

## 2014-02-11 MED ORDER — ONDANSETRON 4 MG PO TBDP
8.0000 mg | ORAL_TABLET | Freq: Once | ORAL | Status: AC
  Administered 2014-02-11: 8 mg via ORAL
  Filled 2014-02-11: qty 2

## 2014-02-11 MED ORDER — SODIUM CHLORIDE 0.9 % IJ SOLN: 3.0000 mL | INTRAMUSCULAR | Status: DC | PRN

## 2014-02-11 MED ORDER — ALBUTEROL (5 MG/ML) CONTINUOUS INHALATION SOLN
10.0000 mg/h | INHALATION_SOLUTION | Freq: Once | RESPIRATORY_TRACT | Status: AC
  Administered 2014-02-11: 10 mg/h via RESPIRATORY_TRACT
  Filled 2014-02-11: qty 20

## 2014-02-11 MED ORDER — OXYCODONE HCL 5 MG PO TABS
5.0000 mg | ORAL_TABLET | ORAL | Status: DC | PRN
  Administered 2014-02-11 – 2014-02-15 (×4): 5 mg via ORAL
  Filled 2014-02-11 (×5): qty 1

## 2014-02-11 MED ORDER — HYDROMORPHONE HCL 1 MG/ML IJ SOLN
0.5000 mg | INTRAMUSCULAR | Status: DC | PRN
  Administered 2014-02-11 – 2014-02-14 (×9): 1 mg via INTRAVENOUS
  Filled 2014-02-11 (×9): qty 1

## 2014-02-11 MED ORDER — ONDANSETRON HCL 4 MG/2ML IJ SOLN
4.0000 mg | Freq: Once | INTRAMUSCULAR | Status: DC
  Filled 2014-02-11: qty 2

## 2014-02-11 MED ORDER — SODIUM CHLORIDE 0.9 % IV SOLN: 250.0000 mL | INTRAVENOUS | Status: DC | PRN

## 2014-02-11 MED ORDER — ONDANSETRON HCL 4 MG PO TABS
4.0000 mg | ORAL_TABLET | Freq: Four times a day (QID) | ORAL | Status: DC | PRN
  Administered 2014-02-14 – 2014-02-15 (×2): 4 mg via ORAL
  Filled 2014-02-11 (×2): qty 1

## 2014-02-11 MED ORDER — ALUM & MAG HYDROXIDE-SIMETH 200-200-20 MG/5ML PO SUSP: 30.0000 mL | Freq: Four times a day (QID) | ORAL | Status: DC | PRN

## 2014-02-11 MED ORDER — NICOTINE 14 MG/24HR TD PT24
14.0000 mg | MEDICATED_PATCH | Freq: Every day | TRANSDERMAL | Status: DC
  Administered 2014-02-11 – 2014-02-15 (×5): 14 mg via TRANSDERMAL
  Filled 2014-02-11 (×5): qty 1

## 2014-02-11 MED ORDER — ENOXAPARIN SODIUM 40 MG/0.4ML ~~LOC~~ SOLN
40.0000 mg | SUBCUTANEOUS | Status: DC
  Administered 2014-02-11 – 2014-02-14 (×4): 40 mg via SUBCUTANEOUS
  Filled 2014-02-11 (×5): qty 0.4

## 2014-02-11 MED ORDER — PIPERACILLIN-TAZOBACTAM 3.375 G IVPB 30 MIN
3.3750 g | Freq: Once | INTRAVENOUS | Status: AC
  Administered 2014-02-11: 3.375 g via INTRAVENOUS
  Filled 2014-02-11 (×2): qty 50

## 2014-02-11 MED ORDER — ACETAMINOPHEN 650 MG RE SUPP: 650.0000 mg | Freq: Four times a day (QID) | RECTAL | Status: DC | PRN

## 2014-02-11 MED ORDER — VANCOMYCIN HCL IN DEXTROSE 1-5 GM/200ML-% IV SOLN
1000.0000 mg | Freq: Two times a day (BID) | INTRAVENOUS | Status: DC
Start: 2014-02-11 — End: 2014-02-14
  Administered 2014-02-11 – 2014-02-14 (×7): 1000 mg via INTRAVENOUS
  Filled 2014-02-11 (×8): qty 200

## 2014-02-11 MED ORDER — POTASSIUM CHLORIDE CRYS ER 20 MEQ PO TBCR
40.0000 meq | EXTENDED_RELEASE_TABLET | Freq: Once | ORAL | Status: AC
  Administered 2014-02-11: 40 meq via ORAL
  Filled 2014-02-11: qty 2

## 2014-02-11 MED ORDER — PRAVASTATIN SODIUM 40 MG PO TABS
40.0000 mg | ORAL_TABLET | Freq: Every day | ORAL | Status: DC
  Administered 2014-02-11 – 2014-02-14 (×4): 40 mg via ORAL
  Filled 2014-02-11 (×5): qty 1

## 2014-02-11 NOTE — ED Notes (Signed)
The pt is c/o sob for one week.  She was seen by her doctor  A few days ago no xrays or lab work was done

## 2014-02-11 NOTE — H&P (Signed)
[facility] Admission History and Physical       Samantha Hinton HKV:[id number] DOB: 28-Sep-1955 DOA: 02/11/2014  Referring physician: EDP PCP: [name], MD  Specialists:   Chief Complaint: SOB Cough and Wheezing  HPI: Samantha Hinton is a 58 y.o. female with COPD Gold II. HTN, who present to the ED with complaints of Worsening SOB, cough and wheezing x  1 week.   She reports havin a cough which has been productive of yellow sputum.  She had been given a Z-pack and prednisone taper by her PCP, but her symptoms had not improved.      Review of Systems:  Constitutional: No Weight Loss, No Weight Gain, Night Sweats, Fevers, Chills, Dizziness, Fatigue, or Generalized Weakness HEENT: No Headaches, Difficulty Swallowing,Tooth/Dental Problems,Sore Throat,  No Sneezing, Rhinitis, Ear Ache, Nasal Congestion, or Post Nasal Drip,  Cardio-vascular:  No Chest pain, Orthopnea, PND, Edema in Lower Extremities, Anasarca, Dizziness, Palpitations  Resp: +Dyspnea, No DOE, +Productive Cough, No Hemoptysis, +Wheezing.    GI: No Heartburn, Indigestion, Abdominal Pain, Nausea, Vomiting, Diarrhea, Hematemesis, Hematochezia, Melena, Change in Bowel Habits,  Loss of Appetite  GU: No Dysuria, Change in Color of Urine, No Urgency or Frequency, No Flank pain.  Musculoskeletal: No Joint Pain or Swelling, No Decreased Range of Motion, No Back Pain.  Neurologic: No Syncope, No Seizures, Muscle Weakness, Paresthesia, Vision Disturbance or Loss, No Diplopia, No Vertigo, No Difficulty Walking,  Skin: No Rash or Lesions. Psych: No Change in Mood or Affect, No Depression or Anxiety, No Memory loss, No Confusion, or Hallucinations   Past Medical History  Diagnosis Date  . COPD (chronic obstructive pulmonary disease)   . Hypertension   . Asthma   . Emphysema   . Hx of varicella   . Heartburn   . Genital warts   . High cholesterol   . Colon polyps   . 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   Marland Kitchen Hx: UTI (urinary  tract infection)   . Pain management     hx of same in [place]  multiple controlled substances that she weaned off 4 years ago cand not any worse takes advil as bneeded       Past Surgical History  Procedure Laterality Date  . Abdominal hysterectomy  1983     for fibroids  sitll has one ovary  age 66   . Cholecystectomy  1983  . Neck surgery  [phone]    c spine ant and post hx fusion and removal or harcdware and shavings  . Back surgery    . Appendectomy  1967  . Breast surgery      diseased milk glands  . Tonsillectomy         Prior to Admission medications   Medication Sig Start Date End Date Taking? Authorizing Provider  albuterol (PROAIR HFA) 108 (90 BASE) MCG/ACT inhaler Inhale 2 puffs into the lungs every 4 (four) hours as needed for wheezing or shortness of breath. 2 puffs every 4 hours as needed only  if your can't catch your breath 07/01/13  Yes Tanda Rockers, MD  albuterol (PROVENTIL) (2.5 MG/3ML) 0.083% nebulizer solution Take 3 mLs (2.5 mg total) by nebulization every 6 (six) hours as needed for wheezing. 02/08/14  Yes Marletta Lor, MD  bisoprolol-hydrochlorothiazide [facility]) 5-6.25 MG per tablet TAKE ONE TABLET BY MOUTH ONCE DAILY 02/01/14  Yes Burnis Medin, MD  budesonide-formoterol [facility]) 160-4.5 MCG/ACT inhaler Take 2 puffs first thing in am and then another  2 puffs about 12 hours later. 07/01/13  Yes Tanda Rockers, MD  CINNAMON PO Take 1 tablet by mouth daily.    Yes Historical Provider, MD  HYDROcodone-homatropine (HYCODAN) 5-1.5 MG/5ML syrup Take 5 mLs by mouth every 6 (six) hours as needed for cough. 02/08/14  Yes Marletta Lor, MD  ipratropium (ATROVENT) 0.02 % nebulizer solution Take 2.5 mLs (500 mcg total) by nebulization 4 (four) times daily as needed for wheezing. 02/08/14  Yes Marletta Lor, MD  Multiple Vitamins-Minerals (CENTRUM PO) Take 1 tablet by mouth daily.    Yes Historical Provider, MD  predniSONE (DELTASONE) 20 MG  tablet Take 3 po qd for 2 days then 2 po qd for 3 days,or as directed 02/08/14  Yes Marletta Lor, MD  promethazine (PHENERGAN) 12.5 MG tablet Take 1 tablet (12.5 mg total) by mouth every 8 (eight) hours as needed for nausea or vomiting. 02/08/14  Yes Marletta Lor, MD  azithromycin (ZITHROMAX) 250 MG tablet 2 tablets once daily for 3 consecutive days Patient not taking: Reported on 02/11/2014 02/08/14   Marletta Lor, MD  lovastatin (MEVACOR) 40 MG tablet Take 1 tablet (40 mg total) by mouth at bedtime. 12/15/13   Burnis Medin, MD      Allergies  Allergen Reactions  . Wellbutrin [Bupropion] Nausea And Vomiting     Social History:  reports that she has been smoking Cigarettes.  She has a 11 pack-year smoking history. She has never used smokeless tobacco. She reports that she drinks about 2.0 oz of alcohol per week. She reports that she does not use illicit drugs.     Family History  Problem Relation Age of Onset  . CAD Father     tumor between heart and lung   . CAD Sister     ? dx   . Hypertension Father   . Hypertension Mother   . Lung cancer Mother     died 73   . Diabetes Maternal Grandmother     late 32s        Physical Exam:  GEN:  Pleasant Elderly Appearing  58 y.o.Caucasian female examined  and in no acute distress; cooperative with exam Filed Vitals:   02/11/14 0445 02/11/14 0447 02/11/14 0500 02/11/14 0603  BP: 109/64  109/68 104/60  Pulse: 101 100 103 103  Temp:    98.7 F (37.1 C)  TempSrc:    Oral  Resp:    30  Height:    5\' 5"  (1.651 m)  Weight:    81.6 kg (179 lb 14.3 oz)  SpO2: 92% 93% 92% 92%   Blood pressure 104/60, pulse 103, temperature 98.7 F (37.1 C), temperature source Oral, resp. rate 30, height 5\' 5"  (1.651 m), weight 81.6 kg (179 lb 14.3 oz), SpO2 92 %. PSYCH: She is alert and oriented x4; does not appear anxious does not appear depressed; affect is normal HEENT: Normocephalic and Atraumatic, Mucous membranes pink;  PERRLA; EOM intact; Fundi:  Benign;  No scleral icterus, Nares: Patent, Oropharynx: Clear,    Neck:  FROM, No Cervical Lymphadenopathy nor Thyromegaly or Carotid Bruit; No JVD; Breasts:: Not examined CHEST WALL: No tenderness CHEST: Decreased Breath Sounds,  +Rhonchi, No Rales  HEART: Regular rate and rhythm; no murmurs rubs or gallops BACK: No kyphosis or scoliosis; No CVA tenderness ABDOMEN: Positive Bowel Sounds,  Soft Non-Tender; No Masses, No Organomegaly.   . Rectal Exam: Not done EXTREMITIES: No Cyanosis, Clubbing, or Edema; No Ulcerations. Genitalia: not  examined PULSES: 2+ and symmetric SKIN: Normal hydration no rash or ulceration CNS:  Alert and Oriented x 4, N o Focal Deficits Vascular: pulses palpable throughout    Labs on Admission:  Basic Metabolic Panel:  Recent Labs Lab 02/11/14 0114  NA 134*  K 3.1*  CL 94*  CO2 29  GLUCOSE 96  BUN 15  CREATININE 0.86  CALCIUM 8.9   Liver Function Tests: No results for input(s): AST, ALT, ALKPHOS, BILITOT, PROT, ALBUMIN in the last 168 hours. No results for input(s): LIPASE, AMYLASE in the last 168 hours. No results for input(s): AMMONIA in the last 168 hours. CBC:  Recent Labs Lab 02/11/14 0114  WBC 18.2*  HGB 12.5  HCT 37.7  MCV 90.0  PLT 330   Cardiac Enzymes: No results for input(s): CKTOTAL, CKMB, CKMBINDEX, TROPONINI in the last 168 hours.  BNP (last 3 results) No results for input(s): PROBNP in the last 8760 hours. CBG: No results for input(s): GLUCAP in the last 168 hours.  Radiological Exams on Admission: Dg Chest Port 1 View  02/11/2014   CLINICAL DATA:  Acute onset of shortness of breath. Initial encounter.  EXAM: PORTABLE CHEST - 1 VIEW  COMPARISON:  Chest radiograph from 06/19/2013  FINDINGS: The lungs are well-aerated and clear. There is no evidence of focal opacification, pleural effusion or pneumothorax. The left costophrenic angle is not fully imaged on this study.  The cardiomediastinal  silhouette is within normal limits. No acute osseous abnormalities are seen.  IMPRESSION: No acute cardiopulmonary process seen.   Electronically Signed   By: Garald Balding M.D.   On: 02/11/2014 02:02     EKG: Independently reviewed.    Assessment/Plan:   58 y.o. female with  Principal Problem:    1.  SIRS   Blood Cultures X2   IV Vancomycin and   Active Problems:       2.   COPD exacerbation/  COPD GOLD II    DUONebs    Steroid Taper    O2 PRN       :    3.   SOB (shortness of breath)/Hypoxemia- Due to #2    O2 PRN         4.   Essential hypertension, benign    Monitor BPs    Continue ZIAC     5.   Hyperlipidemia    Continue Mevacor Rx    6.   Tobacco Abuse    Nicotine Patch      6.   DVT Prophylaxis    Lovenox    Code Status:     FULL CODE  Family Communication:    No Family present Disposition Plan:       Inpatient Med /Surg Bed  Time spent:  60 MInutes  [facility] Pager [phone]   If [place] Please Contact the Day Rounding Team MD for [facility]  If 7PM-7AM, Please Contact Night-Floor Coverage  [email or web address] Password [email or web address] 02/11/2014, 7:46 AM

## 2014-02-11 NOTE — ED Provider Notes (Signed)
CSN: [id number]     Arrival date & time 02/11/14  0100 History  This chart was scribe for Kalman Drape, MD by Judithann Sauger, ED Scribe. The patient was seen in room D31C/D31C and the patient's care was started at 1:21 AM.    Chief Complaint  Patient presents with  . Shortness of Breath   The history is provided by the patient. No language interpreter was used.   HPI Comments: Samantha Hinton is a 58 y.o. female with a hx of COPD, asthma, and emphysema who presents to the Emergency Department complaining of SOB onset 1 week ago. She denies any recent fever, but had fevers to 102 one week ago.  She reports that she has not been able to sleep for a day and a half due to feeling like she is drowning while lying flat. She states that she is not normally on oxygen but she feels better being on it. She reports that she is currently on Symbicort, Hycodan, and Phnergan. She also reports that she is a current smoker. Her last breathing treatment was 5 hours ago PTA. She explains that she did not feel better after the treatment. Pt has been around sick grandchildren.  She has been coughing brown/yellow phlegm.  Pt has cut back on smoking but had been 2 ppd.    PCP: Dr. Regis Bill but saw Dr. Salvadore Farber.   Past Medical History  Diagnosis Date  . COPD (chronic obstructive pulmonary disease)   . Hypertension   . Asthma   . Emphysema   . Hx of varicella   . Heartburn   . Genital warts   . High cholesterol   . Colon polyps   . 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   Marland Kitchen Hx: UTI (urinary tract infection)   . Pain management     hx of same in [place]  multiple controlled substances that she weaned off 4 years ago cand not any worse takes advil as bneeded    Past Surgical History  Procedure Laterality Date  . Abdominal hysterectomy  1983     for fibroids  sitll has one ovary  age 53   . Cholecystectomy  1983  . Neck surgery  [phone]    c spine ant and post hx fusion and removal or harcdware and shavings  .  Back surgery    . Appendectomy  1967  . Breast surgery      diseased milk glands  . Tonsillectomy     Family History  Problem Relation Age of Onset  . CAD Father     tumor between heart and lung   . CAD Sister     ? dx   . Hypertension Father   . Hypertension Mother   . Lung cancer Mother     died 23   . Diabetes Maternal Grandmother     late 43s    History  Substance Use Topics  . Smoking status: Current Some Day Smoker -- 0.25 packs/day for 44 years    Types: Cigarettes  . Smokeless tobacco: Never Used  . Alcohol Use: 2.0 oz/week    4 drink(s) per week   OB History    Gravida Para Term Preterm AB TAB SAB Ectopic Multiple Living   3 2 2             Review of Systems  Constitutional: Negative for fever.  Respiratory: Positive for shortness of breath.       Allergies  Wellbutrin  Home  Medications   Prior to Admission medications   Medication Sig Start Date End Date Taking? Authorizing Provider  albuterol (PROAIR HFA) 108 (90 BASE) MCG/ACT inhaler Inhale 2 puffs into the lungs every 4 (four) hours as needed for wheezing or shortness of breath. 2 puffs every 4 hours as needed only  if your can't catch your breath 07/01/13   Tanda Rockers, MD  albuterol (PROVENTIL) (2.5 MG/3ML) 0.083% nebulizer solution Take 3 mLs (2.5 mg total) by nebulization every 6 (six) hours as needed for wheezing. 02/08/14   Marletta Lor, MD  azithromycin [facility]) 250 MG tablet 2 tablets once daily for 3 consecutive days 02/08/14   Marletta Lor, MD  bisoprolol-hydrochlorothiazide [facility]) 5-6.25 MG per tablet TAKE ONE TABLET BY MOUTH ONCE DAILY 02/01/14   Burnis Medin, MD  budesonide-formoterol [facility]) 160-4.5 MCG/ACT inhaler Take 2 puffs first thing in am and then another 2 puffs about 12 hours later. 07/01/13   Tanda Rockers, MD  CINNAMON PO Take by mouth.    Historical Provider, MD  HYDROcodone-homatropine (HYCODAN) 5-1.5 MG/5ML syrup Take 5 mLs by mouth every 6 (six)  hours as needed for cough. 02/08/14   Marletta Lor, MD  ipratropium (ATROVENT) 0.02 % nebulizer solution Take 2.5 mLs (500 mcg total) by nebulization 4 (four) times daily as needed for wheezing. 02/08/14   Marletta Lor, MD  lovastatin (MEVACOR) 40 MG tablet Take 1 tablet (40 mg total) by mouth at bedtime. 12/15/13   Burnis Medin, MD  Multiple Vitamins-Minerals (CENTRUM PO) Take by mouth daily.    Historical Provider, MD  predniSONE (DELTASONE) 20 MG tablet Take 3 po qd for 2 days then 2 po qd for 3 days,or as directed 02/08/14   Marletta Lor, MD  promethazine (PHENERGAN) 12.5 MG tablet Take 1 tablet (12.5 mg total) by mouth every 8 (eight) hours as needed for nausea or vomiting. 02/08/14   Marletta Lor, MD   BP 120/57 mmHg  Pulse 97  Temp(Src) 98 F (36.7 C)  Resp 34  SpO2 91% Physical Exam  Constitutional: She is oriented to person, place, and time. She appears well-developed and well-nourished. She appears distressed (uncomfortable appearing).  HENT:  Head: Normocephalic and atraumatic.  Nose: Nose normal.  Mouth/Throat: Oropharynx is clear and moist.  Eyes: Conjunctivae and EOM are normal. Pupils are equal, round, and reactive to light.  Neck: Normal range of motion. Neck supple. No JVD present. No tracheal deviation present. No thyromegaly present.  Cardiovascular: Normal rate, regular rhythm, normal heart sounds and intact distal pulses.  Exam reveals no gallop and no friction rub.   No murmur heard. Pulmonary/Chest: No stridor. No respiratory distress. She has wheezes. She has no rales. She exhibits no tenderness.  Poor air movement, prolonged expiratory phase, end expiratory wheeze  Abdominal: Soft. Bowel sounds are normal. She exhibits no distension and no mass. There is no tenderness. There is no rebound and no guarding.  Musculoskeletal: Normal range of motion. She exhibits no edema or tenderness.  Lymphadenopathy:    She has no cervical adenopathy.   Neurological: She is alert and oriented to person, place, and time. She displays normal reflexes. She exhibits normal muscle tone. Coordination normal.  Skin: Skin is warm and dry. No rash noted. No erythema. No pallor.  Psychiatric: She has a normal mood and affect. Her behavior is normal. Judgment and thought content normal.  Nursing note and vitals reviewed.   ED Course  Procedures (including critical  care time) DIAGNOSTIC STUDIES: Oxygen Saturation is 91% on [place], low by my interpretation.    COORDINATION OF CARE: 1:27 AM- Pt advised of plan for treatment and pt agrees.    Labs Review Labs Reviewed  CBC - Abnormal; Notable for the following:    WBC 18.2 (*)    All other components within normal limits  BASIC METABOLIC PANEL - Abnormal; Notable for the following:    Sodium 134 (*)    Potassium 3.1 (*)    Chloride 94 (*)    GFR calc non Af Amer 73 (*)    GFR calc Af Amer 85 (*)    All other components within normal limits  BRAIN NATRIURETIC PEPTIDE  I-STAT TROPOININ, ED    Imaging Review Dg Chest Port 1 View  02/11/2014   CLINICAL DATA:  Acute onset of shortness of breath. Initial encounter.  EXAM: PORTABLE CHEST - 1 VIEW  COMPARISON:  Chest radiograph from 06/19/2013  FINDINGS: The lungs are well-aerated and clear. There is no evidence of focal opacification, pleural effusion or pneumothorax. The left costophrenic angle is not fully imaged on this study.  The cardiomediastinal silhouette is within normal limits. No acute osseous abnormalities are seen.  IMPRESSION: No acute cardiopulmonary process seen.   Electronically Signed   By: Garald Balding M.D.   On: 02/11/2014 02:02     EKG Interpretation   Date/Time:  Thursday February 11 2014 01:14:13 EST Ventricular Rate:  94 PR Interval:  148 QRS Duration: 101 QT Interval:  377 QTC Calculation: 471 R Axis:   79 Text Interpretation:  Sinus rhythm RSR' in V1 or V2, probably normal  variant Nonspecific repol abnormality,  inferior leads Baseline wander in  lead(s) V4 V5 Confirmed by Tylicia Sherman  MD, Maleea Camilo ([id number]) on 02/11/2014 1:27:28  AM      MDM   Final diagnoses:  COPD with acute exacerbation   58 year old female with one week of worsening shortness of breath, seen by primary care doctor 2 days ago, placed on steroids, antibiotics.  Patient without improvement.  Plan for hour long neb, labs and chest x-ray.  Patient currently feeling better on oxygen, does not wear oxygen at home.  I personally performed the services described in this documentation, which was scribed in my presence. The recorded information has been reviewed and is accurate.    3:51 AM No signs of acute infection on chest x-ray.  Leukocytosis noted, patient is on prednisone.  Patient feeling slightly better after hour long neb, however, when able.  He became very dyspneic, fatigued and dropped oxygen saturations.  Will discuss with hospitalist for admission.  Kalman Drape, MD 02/11/14 [phone]

## 2014-02-11 NOTE — Progress Notes (Signed)
ANTIBIOTIC CONSULT NOTE - INITIAL  Pharmacy Consult for Vancomycin, Zosyn Indication: sepsis  Allergies  Allergen Reactions  . Wellbutrin [Bupropion] Nausea And Vomiting    Patient Measurements: Height: 5\' 5"  (165.1 cm) Weight: 179 lb 14.3 oz (81.6 kg) IBW/kg (Calculated) : 57  Vital Signs: Temp: 98.7 F (37.1 C) (12/24 0603) Temp Source: Oral (12/24 0603) BP: 104/60 mmHg (12/24 0603) Pulse Rate: 103 (12/24 0603) Intake/Output from previous day:   Intake/Output from this shift:    Labs:  Recent Labs  02/11/14 0114  WBC 18.2*  HGB 12.5  PLT 330  CREATININE 0.86   Estimated Creatinine Clearance: 75.2 mL/min (by C-G formula based on Cr of 0.86). No results for input(s): VANCOTROUGH, VANCOPEAK, VANCORANDOM, GENTTROUGH, GENTPEAK, GENTRANDOM, TOBRATROUGH, TOBRAPEAK, TOBRARND, AMIKACINPEAK, AMIKACINTROU, AMIKACIN in the last 72 hours.   Microbiology: No results found for this or any previous visit (from the past 720 hour(s)).  Medical History: Past Medical History  Diagnosis Date  . COPD (chronic obstructive pulmonary disease)   . Hypertension   . Asthma   . Emphysema   . Hx of varicella   . Heartburn   . Genital warts   . High cholesterol   . Colon polyps   . 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   Marland Kitchen Hx: UTI (urinary tract infection)   . Pain management     hx of same in [place]  multiple controlled substances that she weaned off 4 years ago cand not any worse takes advil as bneeded     Assessment: 58 year old female with COPD, HTN who presented to the ED with SOB, cough, and wheezing x 1 week.  Pharmacy asked to begin Vancomycin and Zosyn for sepsis.  Goal of Therapy:  Vancomycin trough level 15-20 mcg/ml  Appropriate Zosyn dosing  Plan:  Zosyn 3.375 grams iv Q 8 hours - 4 hr infusion Vancomycin 1 gram iv Q 12 hours Follow up cultures, fever trend, renal trend, progress  Thank you. Anette Guarneri, PharmD [phone]  02/11/2014,8:34 AM

## 2014-02-11 NOTE — Progress Notes (Signed)
   [facility]                                                                              Patient Demographics  Samantha Hinton, is a 58 y.o. female, DOB - [birth year], UMP:[id number]  Admit date - 02/11/2014   Admitting Physician Theressa Millard, MD  Outpatient Primary MD for the patient is [name], MD  LOS - 0   Chief Complaint  Patient presents with  . Shortness of Breath        Assessment & Plan   Patient admitted this morning, agree with current assessment and plan done by Dr. Altamese Cabal.  Dyspnea secondary to COPD Exacerbation  -Continue Duonebs, Steroid taper, and oxygen as needed -CXR: no acute cardiopulmonary process  Essential hypertension -Continue home medication, Ziac  Hyperlipidemia -Continue statin  Tobacco abuse -Patient counseled on smoking cessation -Continue nicotine patch  Code Status: Full  Family Communication: None at bedside  Disposition Plan: Admitted  Time Spent in minutes   30 minutes  Procedures  None  Consults   None  DVT Prophylaxis  Lovenox   Dan Scearce D.O. on 02/11/2014 at 5:11 PM  Between 7am to 7pm - Pager - [phone]  After 7pm go to [email or web address] - password [email or web address]  And look for the night coverage person covering for me after hours  [facility] Office  [phone]

## 2014-02-11 NOTE — Progress Notes (Signed)
NURSING PROGRESS NOTE  Samantha Hinton [id number] Admission Data: 02/11/2014 6:37 AM Attending Provider: Theressa Millard, MD RDE:[id number],JEHUD KOTVAN, MD Code Status: Full       Samantha Hinton is a 58 y.o. female patient admitted from ED:  -No acute distress noted.  -No complaints of shortness of breath.  -No complaints of chest pain.   Cardiac Monitoring: none  Blood pressure 104/60, pulse 103, temperature 98.7 F (37.1 C), temperature source Oral, resp. rate 30, height 5\' 5"  (1.651 m), weight 81.6 kg (179 lb 14.3 oz), SpO2 92 %.   IV Fluids:  IV in place, occlusive dsg intact without redness, IV cath antecubital left, condition patent and no redness IV push only, no IV fluids.   Allergies:  Wellbutrin  Past Medical History:   has a past medical history of COPD (chronic obstructive pulmonary disease); Hypertension; Asthma; Emphysema; varicella; Heartburn; Genital warts; High cholesterol; Colon polyps; Stroke; UTI (urinary tract infection); and Pain management.  Past Surgical History:   has past surgical history that includes Abdominal hysterectomy (1983); Cholecystectomy (1983); Neck surgery [phone]); Back surgery; Appendectomy (1967); Breast surgery; and Tonsillectomy.  Social History:   reports that she has been smoking Cigarettes.  She has a 11 pack-year smoking history. She has never used smokeless tobacco. She reports that she drinks about 2.0 oz of alcohol per week. She reports that she does not use illicit drugs.  Skin: intact, bruise to L buttock  Patient/Family orientated to room. Information packet given to patient/family. Admission inpatient armband information verified with patient/family to include name and date of birth and placed on patient arm. Side rails up x 2, fall assessment and education completed with patient/family. Patient/family able to verbalize understanding of risk associated with falls and verbalized understanding to call for assistance before  getting out of bed. Call light within reach. Patient/family able to voice and demonstrate understanding of unit orientation instructions.

## 2014-02-11 NOTE — Progress Notes (Signed)
Utilization review completed. Zan Triska, RN, BSN. 

## 2014-02-12 DIAGNOSIS — R0602 Shortness of breath: Secondary | ICD-10-CM

## 2014-02-12 DIAGNOSIS — J449 Chronic obstructive pulmonary disease, unspecified: Secondary | ICD-10-CM

## 2014-02-12 LAB — BASIC METABOLIC PANEL
ANION GAP: 10 (ref 5–15)
BUN: 12 mg/dL (ref 6–23)
CALCIUM: 8.9 mg/dL (ref 8.4–10.5)
CO2: 33 mmol/L — ABNORMAL HIGH (ref 19–32)
CREATININE: 0.81 mg/dL (ref 0.50–1.10)
Chloride: 95 mEq/L — ABNORMAL LOW (ref 96–112)
GFR calc Af Amer: >90 mL/min (ref >90)
GFR, EST NON AFRICAN AMERICAN: 79 mL/min — AB (ref >90)
Glucose, Bld: 94 mg/dL (ref 70–99)
Potassium: 3.8 mmol/L (ref 3.5–5.1)
Sodium: 138 mmol/L (ref 135–145)

## 2014-02-12 LAB — CBC
HEMATOCRIT: 37.7 % (ref 36.0–46.0)
Hemoglobin: 12 g/dL (ref 12.0–15.0)
MCH: 29.5 pg (ref 26.0–34.0)
MCHC: 31.8 g/dL (ref 30.0–36.0)
MCV: 92.6 fL (ref 78.0–100.0)
PLATELETS: 343 10*3/uL (ref 150–400)
RBC: 4.07 MIL/uL (ref 3.87–5.11)
RDW: 14.8 % (ref 11.5–15.5)
WBC: 22 10*3/uL — AB (ref 4.0–10.5)

## 2014-02-12 MED ORDER — IPRATROPIUM-ALBUTEROL 0.5-2.5 (3) MG/3ML IN SOLN
3.0000 mL | Freq: Four times a day (QID) | RESPIRATORY_TRACT | Status: DC
  Administered 2014-02-13 – 2014-02-15 (×11): 3 mL via RESPIRATORY_TRACT
  Filled 2014-02-12 (×10): qty 3

## 2014-02-12 MED ORDER — IPRATROPIUM-ALBUTEROL 0.5-2.5 (3) MG/3ML IN SOLN: 3.0000 mL | RESPIRATORY_TRACT | Status: DC | PRN

## 2014-02-12 MED ORDER — METHYLPREDNISOLONE SODIUM SUCC 125 MG IJ SOLR
60.0000 mg | Freq: Two times a day (BID) | INTRAMUSCULAR | Status: DC
  Administered 2014-02-12 – 2014-02-14 (×5): 60 mg via INTRAVENOUS
  Filled 2014-02-12: qty 0.96
  Filled 2014-02-12: qty 2
  Filled 2014-02-12 (×2): qty 0.96
  Filled 2014-02-12: qty 2
  Filled 2014-02-12: qty 0.96

## 2014-02-12 MED ORDER — BENZONATATE 100 MG PO CAPS
200.0000 mg | ORAL_CAPSULE | Freq: Three times a day (TID) | ORAL | Status: DC | PRN
  Administered 2014-02-12 – 2014-02-14 (×6): 200 mg via ORAL
  Filled 2014-02-12 (×8): qty 2

## 2014-02-12 NOTE — Progress Notes (Addendum)
[facility]                                                                              Patient Demographics  Samantha Hinton, is a 58 y.o. female, DOB - 20-Nov-1955, [id number]  Admit date - 02/11/2014   Admitting Physician Theressa Millard, MD  Outpatient Primary MD for the patient is [name], MD  LOS - 1   Chief Complaint  Patient presents with  . Shortness of Breath      Brief history 58 year old with history of COPD, hypertension and presented to the emergency department with cough and wheezing. Admitted for COPD exacerbation. Patient had been given a Z-Pak as well as prednisone taper by her primary care physician however her symptoms did not improve. Patient currently on broad-spectrum antibiotics as well as Solu-Medrol.  Assessment & Plan   Dyspnea secondary to COPD Exacerbation  -Continue Duonebs, solumedrol, antitussives, and oxygen as needed -CXR: no acute cardiopulmonary process -patient was started on broad spectrum antibiotics at admission  Essential hypertension -Continue home medication, Ziac  Hyperlipidemia -Continue statin  Tobacco abuse -Patient counseled on smoking cessation -Continue nicotine patch  Leukocytosis -Likely reactive -will continue to monitor CBC  Code Status: Full  Family Communication: None at bedside  Disposition Plan: Admitted  Time Spent in minutes 30 minutes  Procedures  None  Consults  None  DVT Prophylaxis Lovenox  Lab Results  Component Value Date   PLT 343 02/12/2014    Medications  Scheduled Meds: . antiseptic oral rinse  7 mL Mouth Rinse BID  . bisoprolol-hydrochlorothiazide  1 tablet Oral Daily  . enoxaparin (LOVENOX) injection  40 mg Subcutaneous Q24H  . ipratropium-albuterol  3 mL Nebulization Q4H  . methylPREDNISolone (SOLU-MEDROL) injection  60 mg Intravenous Q12H  . nicotine  14 mg Transdermal Daily  . ondansetron (ZOFRAN) IV  4 mg Intravenous Once  .  piperacillin-tazobactam (ZOSYN)  IV  3.375 g Intravenous 3 times per day  . pravastatin  40 mg Oral q1800  . sodium chloride  3 mL Intravenous Q12H  . vancomycin  1,000 mg Intravenous Q12H   Continuous Infusions:  PRN Meds:.sodium chloride, acetaminophen **OR** acetaminophen, alum & mag hydroxide-simeth, HYDROcodone-homatropine, HYDROmorphone (DILAUDID) injection, ondansetron **OR** ondansetron (ZOFRAN) IV, oxyCODONE, sodium chloride  Antibiotics    Anti-infectives    Start     Dose/Rate Route Frequency Ordered Stop   02/11/14 1400  piperacillin-tazobactam (ZOSYN) IVPB 3.375 g     3.375 g12.5 mL/hr over 240 Minutes Intravenous 3 times per day 02/11/14 0804     02/11/14 1000  vancomycin (VANCOCIN) IVPB 1000 mg/200 mL premix     1,000 mg200 mL/hr over 60 Minutes Intravenous Every 12 hours 02/11/14 0838     02/11/14 0815  piperacillin-tazobactam (ZOSYN) IVPB 3.375 g     3.375 g100 mL/hr over 30 Minutes Intravenous  Once 02/11/14 2831 02/11/14 1052      Subjective:   Samantha Hinton seen and examined today.  Patient states she continues to cough and she feels worse than yesterday. She states her shortness of breath is also about the same as it was yesterday. She denies any chest pain, palpitations, abdominal pain at this time.  Objective:   Filed Vitals:   02/12/14 0039 02/12/14 0310 02/12/14 0533 02/12/14 0743  BP:   139/71   Pulse:   84   Temp:   99.4 F (37.4 C)   TempSrc:   Oral   Resp:   18   Height:      Weight:      SpO2: 95% 94% 95% 93%    Wt Readings from Last 3 Encounters:  02/11/14 81.6 kg (179 lb 14.3 oz)  02/08/14 81.647 kg (180 lb)  12/01/13 79.198 kg (174 lb 9.6 oz)     Intake/Output Summary (Last 24 hours) at 02/12/14 1131 Last data filed at 02/11/14 2050  Gross per 24 hour  Intake      0 ml  Output   1000 ml  Net  -1000 ml    Exam  General: Well developed, well nourished, NAD, appears stated age  HEENT: NCAT,  mucous membranes moist.    Cardiovascular: S1 S2 auscultated, no rubs, murmurs or gallops. Regular rate and rhythm.  Respiratory: Rhonchorous and coarse breath sounds  Abdomen: Soft, nontender, nondistended, + bowel sounds  Extremities: warm dry without cyanosis clubbing or edema  Neuro: AAOx3, no focal deficits  Psych: Normal affect and demeanor with intact judgement and insight  Data Review   Micro Results Recent Results (from the past 240 hour(s))  Culture, blood (routine x 2)     Status: None (Preliminary result)   Collection Time: 02/11/14  9:20 AM  Result Value Ref Range Status   Specimen Description BLOOD RIGHT HAND  Final   Special Requests BOTTLES DRAWN AEROBIC ONLY 5CC  Final   Culture   Final           BLOOD CULTURE RECEIVED NO GROWTH TO DATE CULTURE WILL BE HELD FOR 5 DAYS BEFORE ISSUING A FINAL NEGATIVE REPORT Performed at [facility]    Report Status PENDING  Incomplete  Culture, blood (routine x 2)     Status: None (Preliminary result)   Collection Time: 02/11/14  9:30 AM  Result Value Ref Range Status   Specimen Description BLOOD RIGHT ARM  Final   Special Requests BOTTLES DRAWN AEROBIC ONLY 3CC  Final   Culture   Final           BLOOD CULTURE RECEIVED NO GROWTH TO DATE CULTURE WILL BE HELD FOR 5 DAYS BEFORE ISSUING A FINAL NEGATIVE REPORT Performed at [facility]    Report Status PENDING  Incomplete    Radiology Reports Dg Chest Port 1 View  02/11/2014   CLINICAL DATA:  Acute onset of shortness of breath. Initial encounter.  EXAM: PORTABLE CHEST - 1 VIEW  COMPARISON:  Chest radiograph from 06/19/2013  FINDINGS: The lungs are well-aerated and clear. There is no evidence of focal opacification, pleural effusion or pneumothorax. The left costophrenic angle is not fully imaged on this study.  The cardiomediastinal silhouette is within normal limits. No acute osseous abnormalities are seen.  IMPRESSION: No acute cardiopulmonary process seen.   Electronically Signed    By: Garald Balding M.D.   On: 02/11/2014 02:02    CBC  Recent Labs Lab 02/11/14 0114 02/12/14 0705  WBC 18.2* 22.0*  HGB 12.5 12.0  HCT 37.7 37.7  PLT 330 343  MCV 90.0 92.6  MCH 29.8 29.5  MCHC 33.2 31.8  RDW 14.5 14.8    Chemistries   Recent Labs Lab 02/11/14 0114 02/12/14 0705  NA 134* 138  K 3.1* 3.8  CL  94* 95*  CO2 29 33*  GLUCOSE 96 94  BUN 15 12  CREATININE 0.86 0.81  CALCIUM 8.9 8.9   ------------------------------------------------------------------------------------------------------------------ estimated creatinine clearance is 79.8 mL/min (by C-G formula based on Cr of 0.81). ------------------------------------------------------------------------------------------------------------------ No results for input(s): HGBA1C in the last 72 hours. ------------------------------------------------------------------------------------------------------------------ No results for input(s): CHOL, HDL, LDLCALC, TRIG, CHOLHDL, LDLDIRECT in the last 72 hours. ------------------------------------------------------------------------------------------------------------------ No results for input(s): TSH, T4TOTAL, T3FREE, THYROIDAB in the last 72 hours.  Invalid input(s): FREET3 ------------------------------------------------------------------------------------------------------------------ No results for input(s): VITAMINB12, FOLATE, FERRITIN, TIBC, IRON, RETICCTPCT in the last 72 hours.  Coagulation profile No results for input(s): INR, PROTIME in the last 168 hours.  No results for input(s): DDIMER in the last 72 hours.  Cardiac Enzymes No results for input(s): CKMB, TROPONINI, MYOGLOBIN in the last 168 hours.  Invalid input(s): CK ------------------------------------------------------------------------------------------------------------------ Invalid input(s): POCBNP    Ibrohim Simmers D.O. on 02/12/2014 at 11:31 AM  Between 7am to 7pm - Pager -  [phone]  After 7pm go to [email or web address] - password [email or web address]  And look for the night coverage person covering for me after hours  [facility] Office  [phone]

## 2014-02-13 DIAGNOSIS — B370 Candidal stomatitis: Secondary | ICD-10-CM | POA: Clinically undetermined

## 2014-02-13 DIAGNOSIS — J0110 Acute frontal sinusitis, unspecified: Secondary | ICD-10-CM

## 2014-02-13 DIAGNOSIS — J019 Acute sinusitis, unspecified: Secondary | ICD-10-CM | POA: Diagnosis present

## 2014-02-13 LAB — CBC
HCT: 36.2 % (ref 36.0–46.0)
HEMOGLOBIN: 11.5 g/dL — AB (ref 12.0–15.0)
MCH: 29.6 pg (ref 26.0–34.0)
MCHC: 31.8 g/dL (ref 30.0–36.0)
MCV: 93.1 fL (ref 78.0–100.0)
PLATELETS: 347 10*3/uL (ref 150–400)
RBC: 3.89 MIL/uL (ref 3.87–5.11)
RDW: 14.6 % (ref 11.5–15.5)
WBC: 19.8 10*3/uL — ABNORMAL HIGH (ref 4.0–10.5)

## 2014-02-13 MED ORDER — NYSTATIN 100000 UNIT/ML MT SUSP
5.0000 mL | Freq: Four times a day (QID) | OROMUCOSAL | Status: DC
  Administered 2014-02-13 – 2014-02-15 (×8): 500000 [IU] via ORAL
  Filled 2014-02-13 (×10): qty 5

## 2014-02-13 MED ORDER — FUROSEMIDE 40 MG PO TABS
40.0000 mg | ORAL_TABLET | Freq: Once | ORAL | Status: AC
  Administered 2014-02-13: 40 mg via ORAL
  Filled 2014-02-13: qty 1

## 2014-02-13 MED ORDER — HYDRALAZINE HCL 20 MG/ML IJ SOLN
5.0000 mg | Freq: Once | INTRAMUSCULAR | Status: AC
  Administered 2014-02-13: 5 mg via INTRAVENOUS
  Filled 2014-02-13: qty 1

## 2014-02-13 MED ORDER — FLUTICASONE PROPIONATE 50 MCG/ACT NA SUSP
2.0000 | Freq: Every day | NASAL | Status: DC
  Administered 2014-02-13 – 2014-02-15 (×3): 2 via NASAL
  Filled 2014-02-13: qty 16

## 2014-02-13 NOTE — Progress Notes (Signed)
[facility]                                                                              Patient Demographics  Samantha Hinton, is a 58 y.o. female, DOB - 12-Feb-1956, JOA:[id number]  Admit date - 02/11/2014   Admitting Physician Theressa Millard, MD  Outpatient Primary MD for the patient is [name], MD  LOS - 2   Chief Complaint  Patient presents with  . Shortness of Breath      Brief history 58 year old with history of COPD, hypertension and presented to the emergency department with cough and wheezing. Admitted for COPD exacerbation. Patient had been given a Z-Pak as well as prednisone taper by her primary care physician however her symptoms did not improve.   Assessment & Plan   COPD Exacerbation  -Continue Duonebs, solumedrol, antitussives, abx and oxygen  -CXR: no acute cardiopulmonary process Having sinus congestion and post nasal drip. Will add flonase for acute frontal sinusitis  Essential hypertension -Continue home medication, Ziac  Hyperlipidemia -Continue statin  Tobacco abuse Pt motivated to quit -Continue nicotine patch  Swelling secondary to steroids, IVF Lasix x1  Thrush: nystatin  Code Status: Full  Family Communication: None at bedside  Disposition Plan:   Time Spent in minutes 30 minutes  Procedures  None  Consults  None  DVT Prophylaxis Lovenox  Lab Results  Component Value Date   PLT 347 02/13/2014    Medications  Scheduled Meds: . antiseptic oral rinse  7 mL Mouth Rinse BID  . bisoprolol-hydrochlorothiazide  1 tablet Oral Daily  . enoxaparin (LOVENOX) injection  40 mg Subcutaneous Q24H  . fluticasone  2 spray Each Nare Daily  . furosemide  40 mg Oral Once  . ipratropium-albuterol  3 mL Nebulization Q6H  . methylPREDNISolone (SOLU-MEDROL) injection  60 mg Intravenous Q12H  . nicotine  14 mg Transdermal Daily  . nystatin  5 mL Oral QID  . ondansetron (ZOFRAN) IV  4 mg Intravenous Once  .  piperacillin-tazobactam (ZOSYN)  IV  3.375 g Intravenous 3 times per day  . pravastatin  40 mg Oral q1800  . sodium chloride  3 mL Intravenous Q12H  . vancomycin  1,000 mg Intravenous Q12H   Continuous Infusions:  PRN Meds:.sodium chloride, acetaminophen **OR** acetaminophen, alum & mag hydroxide-simeth, benzonatate, HYDROcodone-homatropine, HYDROmorphone (DILAUDID) injection, ipratropium-albuterol, ondansetron **OR** ondansetron (ZOFRAN) IV, oxyCODONE, sodium chloride  Antibiotics    Anti-infectives    Start     Dose/Rate Route Frequency Ordered Stop   02/11/14 1400  piperacillin-tazobactam (ZOSYN) IVPB 3.375 g     3.375 g12.5 mL/hr over 240 Minutes Intravenous 3 times per day 02/11/14 0804     02/11/14 1000  vancomycin (VANCOCIN) IVPB 1000 mg/200 mL premix     1,000 mg200 mL/hr over 60 Minutes Intravenous Every 12 hours 02/11/14 0838     02/11/14 0815  piperacillin-tazobactam (ZOSYN) IVPB 3.375 g     3.375 g100 mL/hr over 30 Minutes Intravenous  Once 02/11/14 0807 02/11/14 1052      Subjective:   Very dizzy and dyspneic. Having to use BSC. Cough thick white, was brown. C/o mouth and throat pain. Frontal sinus pressure and post nasal  drip.   Objective:   Filed Vitals:   02/13/14 0810 02/13/14 0940 02/13/14 1322 02/13/14 1436  BP: 143/75  145/75   Pulse: 83  93   Temp:   97.8 F (36.6 C)   TempSrc:   Axillary   Resp:      Height:      Weight:      SpO2: 96% 96% 95% 95%    Wt Readings from Last 3 Encounters:  02/11/14 81.6 kg (179 lb 14.3 oz)  02/08/14 81.647 kg (180 lb)  12/01/13 79.198 kg (174 lb 9.6 oz)     Intake/Output Summary (Last 24 hours) at 02/13/14 1724 Last data filed at 02/13/14 1340  Gross per 24 hour  Intake    403 ml  Output    250 ml  Net    153 ml    Exam  General: Well developed, well nourished, NAD, appears stated age  55: white plaques soft palate and buccal mucosa  Cardiovascular: S1 S2 auscultated, no rubs, murmurs or gallops.  Regular rate and rhythm.  Respiratory: rhonchi, exp wheeze and prolonged exp phase. No rales  Abdomen: Soft, nontender, nondistended, + bowel sounds  Extremities: hands and wrist swollen  Data Review   Micro Results Recent Results (from the past 240 hour(s))  Culture, blood (routine x 2)     Status: None (Preliminary result)   Collection Time: 02/11/14  9:20 AM  Result Value Ref Range Status   Specimen Description BLOOD RIGHT HAND  Final   Special Requests BOTTLES DRAWN AEROBIC ONLY 5CC  Final   Culture   Final           BLOOD CULTURE RECEIVED NO GROWTH TO DATE CULTURE WILL BE HELD FOR 5 DAYS BEFORE ISSUING A FINAL NEGATIVE REPORT Performed at [facility]    Report Status PENDING  Incomplete  Culture, blood (routine x 2)     Status: None (Preliminary result)   Collection Time: 02/11/14  9:30 AM  Result Value Ref Range Status   Specimen Description BLOOD RIGHT ARM  Final   Special Requests BOTTLES DRAWN AEROBIC ONLY 3CC  Final   Culture   Final           BLOOD CULTURE RECEIVED NO GROWTH TO DATE CULTURE WILL BE HELD FOR 5 DAYS BEFORE ISSUING A FINAL NEGATIVE REPORT Performed at [facility]    Report Status PENDING  Incomplete    Radiology Reports Dg Chest Port 1 View  02/11/2014   CLINICAL DATA:  Acute onset of shortness of breath. Initial encounter.  EXAM: PORTABLE CHEST - 1 VIEW  COMPARISON:  Chest radiograph from 06/19/2013  FINDINGS: The lungs are well-aerated and clear. There is no evidence of focal opacification, pleural effusion or pneumothorax. The left costophrenic angle is not fully imaged on this study.  The cardiomediastinal silhouette is within normal limits. No acute osseous abnormalities are seen.  IMPRESSION: No acute cardiopulmonary process seen.   Electronically Signed   By: Garald Balding M.D.   On: 02/11/2014 02:02    CBC  Recent Labs Lab 02/11/14 0114 02/12/14 0705 02/13/14 0425  WBC 18.2* 22.0* 19.8*  HGB 12.5 12.0 11.5*  HCT 37.7  37.7 36.2  PLT 330 343 347  MCV 90.0 92.6 93.1  MCH 29.8 29.5 29.6  MCHC 33.2 31.8 31.8  RDW 14.5 14.8 14.6    Chemistries   Recent Labs Lab 02/11/14 0114 02/12/14 0705  NA 134* 138  K 3.1* 3.8  CL 94*  95*  CO2 29 33*  GLUCOSE 96 94  BUN 15 12  CREATININE 0.86 0.81  CALCIUM 8.9 8.9   ------------------------------------------------------------------------------------------------------------------ estimated creatinine clearance is 79.8 mL/min (by C-G formula based on Cr of 0.81). ------------------------------------------------------------------------------------------------------------------ No results for input(s): HGBA1C in the last 72 hours. ------------------------------------------------------------------------------------------------------------------ No results for input(s): CHOL, HDL, LDLCALC, TRIG, CHOLHDL, LDLDIRECT in the last 72 hours. ------------------------------------------------------------------------------------------------------------------ No results for input(s): TSH, T4TOTAL, T3FREE, THYROIDAB in the last 72 hours.  Invalid input(s): FREET3 ------------------------------------------------------------------------------------------------------------------ No results for input(s): VITAMINB12, FOLATE, FERRITIN, TIBC, IRON, RETICCTPCT in the last 72 hours.  Coagulation profile No results for input(s): INR, PROTIME in the last 168 hours.  No results for input(s): DDIMER in the last 72 hours.  Cardiac Enzymes No results for input(s): CKMB, TROPONINI, MYOGLOBIN in the last 168 hours.  Invalid input(s): CK ------------------------------------------------------------------------------------------------------------------ Invalid input(s): POCBNP    Kingslee Mairena L MD on 02/13/2014 at 5:24 PM  Text page [email or web address] - password [email or web address]  [facility]

## 2014-02-13 NOTE — Progress Notes (Signed)
Made Dr. Alene Mires aware pt BP 161/71, Ordered to recheck bp manually. Rechecked bp manually 166/86. Paged MD awaiting call back.

## 2014-02-14 MED ORDER — LEVOFLOXACIN 500 MG PO TABS: 500.0000 mg | ORAL_TABLET | Freq: Every day | ORAL | Status: DC

## 2014-02-14 MED ORDER — PREDNISONE 50 MG PO TABS
60.0000 mg | ORAL_TABLET | Freq: Every day | ORAL | Status: DC
  Administered 2014-02-15: 60 mg via ORAL
  Filled 2014-02-14 (×2): qty 1

## 2014-02-14 MED ORDER — LEVOFLOXACIN 500 MG PO TABS
500.0000 mg | ORAL_TABLET | Freq: Every day | ORAL | Status: DC
  Administered 2014-02-14: 500 mg via ORAL
  Filled 2014-02-14 (×2): qty 1

## 2014-02-14 NOTE — Progress Notes (Signed)
[facility]                                                                              Patient Demographics  Samantha Hinton, is a 58 y.o. female, DOB - [birth year], NFA:[id number]  Admit date - 02/11/2014   Admitting Physician Theressa Millard, MD  Outpatient Primary MD for the patient is [name], MD  LOS - 3   Chief Complaint  Patient presents with  . Shortness of Breath      Brief history 58 year old with history of COPD, hypertension and presented to the emergency department with cough and wheezing. Admitted for COPD exacerbation. Patient had been given a Z-Pak as well as prednisone taper by her primary care physician however her symptoms did not improve.   Assessment & Plan   COPD Exacerbation  Improving. Change to po abx and steroids. Check ambulatory pulse ox. Home tomorrow if stable  Essential hypertension -Continue home medication, Ziac  Hyperlipidemia -Continue statin  Tobacco abuse Pt motivated to quit -Continue nicotine patch  Swelling secondary to steroids, IVF Improved after steroids  Thrush: nystatin  Code Status: Full  Family Communication: None at bedside  Disposition Plan:   Time Spent in minutes 30 minutes  Procedures  None  Consults  None  DVT Prophylaxis Lovenox  Lab Results  Component Value Date   PLT 347 02/13/2014    Medications  Scheduled Meds: . antiseptic oral rinse  7 mL Mouth Rinse BID  . bisoprolol-hydrochlorothiazide  1 tablet Oral Daily  . enoxaparin (LOVENOX) injection  40 mg Subcutaneous Q24H  . fluticasone  2 spray Each Nare Daily  . ipratropium-albuterol  3 mL Nebulization Q6H  . methylPREDNISolone (SOLU-MEDROL) injection  60 mg Intravenous Q12H  . nicotine  14 mg Transdermal Daily  . nystatin  5 mL Oral QID  . ondansetron (ZOFRAN) IV  4 mg Intravenous Once  . piperacillin-tazobactam (ZOSYN)  IV  3.375 g Intravenous 3 times per day  . pravastatin  40 mg Oral q1800  . sodium  chloride  3 mL Intravenous Q12H  . vancomycin  1,000 mg Intravenous Q12H   Continuous Infusions:  PRN Meds:.sodium chloride, acetaminophen **OR** acetaminophen, alum & mag hydroxide-simeth, benzonatate, HYDROcodone-homatropine, HYDROmorphone (DILAUDID) injection, ipratropium-albuterol, ondansetron **OR** ondansetron (ZOFRAN) IV, oxyCODONE, sodium chloride  Antibiotics    Anti-infectives    Start     Dose/Rate Route Frequency Ordered Stop   02/11/14 1400  piperacillin-tazobactam (ZOSYN) IVPB 3.375 g     3.375 g12.5 mL/hr over 240 Minutes Intravenous 3 times per day 02/11/14 0804     02/11/14 1000  vancomycin (VANCOCIN) IVPB 1000 mg/200 mL premix     1,000 mg200 mL/hr over 60 Minutes Intravenous Every 12 hours 02/11/14 0838     02/11/14 0815  piperacillin-tazobactam (ZOSYN) IVPB 3.375 g     3.375 g100 mL/hr over 30 Minutes Intravenous  Once 02/11/14 0807 02/11/14 1052      Subjective:   Breathing improved. Able to ambulate to bathroom.   Objective:   Filed Vitals:   02/14/14 0501 02/14/14 0744 02/14/14 1416 02/14/14 1422  BP: 142/74   144/64  Pulse:    78  Temp:    98.5 F (  36.9 C)  TempSrc:    Oral  Resp:    18  Height:      Weight:      SpO2:  96% 94% 93%    Wt Readings from Last 3 Encounters:  02/14/14 82.5 kg (181 lb 14.1 oz)  02/08/14 81.647 kg (180 lb)  12/01/13 79.198 kg (174 lb 9.6 oz)     Intake/Output Summary (Last 24 hours) at 02/14/14 1641 Last data filed at 02/14/14 1536  Gross per 24 hour  Intake    123 ml  Output   2000 ml  Net  -1877 ml    Exam  General: Well developed, well nourished, NAD, appears stated age  55: white plaques soft palate and buccal mucosa  Cardiovascular: S1 S2 auscultated, no rubs, murmurs or gallops. Regular rate and rhythm.  Respiratory: better air movement. Still with wheeze rhonchi  Abdomen: Soft, nontender, nondistended, + bowel sounds  Extremities: hands and wrist swollen  Data Review   Micro  Results Recent Results (from the past 240 hour(s))  Culture, blood (routine x 2)     Status: None (Preliminary result)   Collection Time: 02/11/14  9:20 AM  Result Value Ref Range Status   Specimen Description BLOOD RIGHT HAND  Final   Special Requests BOTTLES DRAWN AEROBIC ONLY 5CC  Final   Culture   Final           BLOOD CULTURE RECEIVED NO GROWTH TO DATE CULTURE WILL BE HELD FOR 5 DAYS BEFORE ISSUING A FINAL NEGATIVE REPORT Performed at [facility]    Report Status PENDING  Incomplete  Culture, blood (routine x 2)     Status: None (Preliminary result)   Collection Time: 02/11/14  9:30 AM  Result Value Ref Range Status   Specimen Description BLOOD RIGHT ARM  Final   Special Requests BOTTLES DRAWN AEROBIC ONLY 3CC  Final   Culture   Final           BLOOD CULTURE RECEIVED NO GROWTH TO DATE CULTURE WILL BE HELD FOR 5 DAYS BEFORE ISSUING A FINAL NEGATIVE REPORT Performed at [facility]    Report Status PENDING  Incomplete    Radiology Reports Dg Chest Port 1 View  02/11/2014   CLINICAL DATA:  Acute onset of shortness of breath. Initial encounter.  EXAM: PORTABLE CHEST - 1 VIEW  COMPARISON:  Chest radiograph from 06/19/2013  FINDINGS: The lungs are well-aerated and clear. There is no evidence of focal opacification, pleural effusion or pneumothorax. The left costophrenic angle is not fully imaged on this study.  The cardiomediastinal silhouette is within normal limits. No acute osseous abnormalities are seen.  IMPRESSION: No acute cardiopulmonary process seen.   Electronically Signed   By: Garald Balding M.D.   On: 02/11/2014 02:02    CBC  Recent Labs Lab 02/11/14 0114 02/12/14 0705 02/13/14 0425  WBC 18.2* 22.0* 19.8*  HGB 12.5 12.0 11.5*  HCT 37.7 37.7 36.2  PLT 330 343 347  MCV 90.0 92.6 93.1  MCH 29.8 29.5 29.6  MCHC 33.2 31.8 31.8  RDW 14.5 14.8 14.6    Chemistries   Recent Labs Lab 02/11/14 0114 02/12/14 0705  NA 134* 138  K 3.1* 3.8  CL 94*  95*  CO2 29 33*  GLUCOSE 96 94  BUN 15 12  CREATININE 0.86 0.81  CALCIUM 8.9 8.9   ------------------------------------------------------------------------------------------------------------------ estimated creatinine clearance is 80.3 mL/min (by C-G formula based on Cr of 0.81). ------------------------------------------------------------------------------------------------------------------ No results for  input(s): HGBA1C in the last 72 hours. ------------------------------------------------------------------------------------------------------------------ No results for input(s): CHOL, HDL, LDLCALC, TRIG, CHOLHDL, LDLDIRECT in the last 72 hours. ------------------------------------------------------------------------------------------------------------------ No results for input(s): TSH, T4TOTAL, T3FREE, THYROIDAB in the last 72 hours.  Invalid input(s): FREET3 ------------------------------------------------------------------------------------------------------------------ No results for input(s): VITAMINB12, FOLATE, FERRITIN, TIBC, IRON, RETICCTPCT in the last 72 hours.  Coagulation profile No results for input(s): INR, PROTIME in the last 168 hours.  No results for input(s): DDIMER in the last 72 hours.  Cardiac Enzymes No results for input(s): CKMB, TROPONINI, MYOGLOBIN in the last 168 hours.  Invalid input(s): CK ------------------------------------------------------------------------------------------------------------------ Invalid input(s): POCBNP    Alaney Witter L MD on 02/14/2014 at 4:41 PM  Text page [email or web address] - password [facility]  [facility]

## 2014-02-14 NOTE — Progress Notes (Signed)
ANTIBIOTIC CONSULT NOTE - FOLLOW UP  Pharmacy Consult for Vancomycin and Zosyn Indication: Sepsis  Allergies  Allergen Reactions  . Wellbutrin [Bupropion] Nausea And Vomiting    Patient Measurements: Height: 5\' 5"  (165.1 cm) Weight: 181 lb 14.1 oz (82.5 kg) IBW/kg (Calculated) : 57  Vital Signs: Temp: 98.2 F (36.8 C) (12/27 0443) Temp Source: Oral (12/27 0443) BP: 142/74 mmHg (12/27 0501) Pulse Rate: 74 (12/27 0443) Intake/Output from previous day: 12/26 0701 - 12/27 0700 In: 403 [P.O.:400; I.V.:3] Out: 250 [Urine:250] Intake/Output from this shift: Total I/O In: 120 [P.O.:120] Out: -   Labs:  Recent Labs  02/12/14 0705 02/13/14 0425  WBC 22.0* 19.8*  HGB 12.0 11.5*  PLT 343 347  CREATININE 0.81  --    Estimated Creatinine Clearance: 80.3 mL/min (by C-G formula based on Cr of 0.81). No results for input(s): VANCOTROUGH, VANCOPEAK, VANCORANDOM, GENTTROUGH, GENTPEAK, GENTRANDOM, TOBRATROUGH, TOBRAPEAK, TOBRARND, AMIKACINPEAK, AMIKACINTROU, AMIKACIN in the last 72 hours.   Microbiology: Recent Results (from the past 720 hour(s))  Culture, blood (routine x 2)     Status: None (Preliminary result)   Collection Time: 02/11/14  9:20 AM  Result Value Ref Range Status   Specimen Description BLOOD RIGHT HAND  Final   Special Requests BOTTLES DRAWN AEROBIC ONLY 5CC  Final   Culture   Final           BLOOD CULTURE RECEIVED NO GROWTH TO DATE CULTURE WILL BE HELD FOR 5 DAYS BEFORE ISSUING A FINAL NEGATIVE REPORT Performed at [facility]    Report Status PENDING  Incomplete  Culture, blood (routine x 2)     Status: None (Preliminary result)   Collection Time: 02/11/14  9:30 AM  Result Value Ref Range Status   Specimen Description BLOOD RIGHT ARM  Final   Special Requests BOTTLES DRAWN AEROBIC ONLY 3CC  Final   Culture   Final           BLOOD CULTURE RECEIVED NO GROWTH TO DATE CULTURE WILL BE HELD FOR 5 DAYS BEFORE ISSUING A FINAL NEGATIVE REPORT Performed at  [facility]    Report Status PENDING  Incomplete    Anti-infectives    Start     Dose/Rate Route Frequency Ordered Stop   02/11/14 1400  piperacillin-tazobactam (ZOSYN) IVPB 3.375 g     3.375 g12.5 mL/hr over 240 Minutes Intravenous 3 times per day 02/11/14 0804     02/11/14 1000  vancomycin (VANCOCIN) IVPB 1000 mg/200 mL premix     1,000 mg200 mL/hr over 60 Minutes Intravenous Every 12 hours 02/11/14 0838     02/11/14 0815  piperacillin-tazobactam (ZOSYN) IVPB 3.375 g     3.375 g100 mL/hr over 30 Minutes Intravenous  Once 02/11/14 0807 02/11/14 1052      Assessment: 58 year old female with COPD, HTN who presented to the ED with SOB, cough, and wheezing x 1 week. Pt had been given a Z-pak and prednisone taper by PCP and her symptoms did not improve. Vanc and Zosyn was started on 12/24. This is day#4 of abx and pt remains very dyspneic, still has cough with thick white sputum. Pt also complains of mouth and throat pain as well as frontal sinus pressure. Pt remains afebrile, WBC elevated however has been on steroids. SCr 0.81, CrCl ~31ml/min. Talked with MD and plan is to continue broad spectrum abx for now and assess deescalation tomorrow.  Goal of Therapy:  Vancomycin trough level 15-20 mcg/ml   Plan:  Continue  Vancomycin 1g IV Q12 Continue Zosyn 3.375g IV Q8 Check VT at 0930 tomorrow Monitor renal function, clinical progress F/U deescalation and LOT  Rajohn Henery J 02/14/2014,2:06 PM

## 2014-02-14 NOTE — Progress Notes (Addendum)
Pt ambulated 600 ft on RA. O2 sats 87-93%. Rested 30 min on RA sats 88% Susie Cassette RN

## 2014-02-15 MED ORDER — LEVOFLOXACIN 500 MG PO TABS: 500.0000 mg | ORAL_TABLET | Freq: Every day | ORAL | Status: DC

## 2014-02-15 MED ORDER — NYSTATIN 100000 UNIT/ML MT SUSP: 5.0000 mL | Freq: Four times a day (QID) | OROMUCOSAL | Status: DC

## 2014-02-15 MED ORDER — FLUTICASONE PROPIONATE 50 MCG/ACT NA SUSP: 2.0000 | Freq: Every day | NASAL | Status: DC

## 2014-02-15 MED ORDER — HYDROCODONE-ACETAMINOPHEN 5-325 MG PO TABS: 1.0000 | ORAL_TABLET | Freq: Four times a day (QID) | ORAL | Status: DC | PRN

## 2014-02-15 MED ORDER — ACETAMINOPHEN 325 MG PO TABS: 650.0000 mg | ORAL_TABLET | Freq: Four times a day (QID) | ORAL | Status: DC | PRN

## 2014-02-15 MED ORDER — PREDNISONE 10 MG PO TABS: ORAL_TABLET | ORAL | Status: DC

## 2014-02-15 MED ORDER — BUDESONIDE-FORMOTEROL FUMARATE 160-4.5 MCG/ACT IN AERO: INHALATION_SPRAY | RESPIRATORY_TRACT | Status: DC

## 2014-02-15 MED ORDER — BENZONATATE 200 MG PO CAPS: 200.0000 mg | ORAL_CAPSULE | Freq: Three times a day (TID) | ORAL | Status: DC | PRN

## 2014-02-15 MED ORDER — PROMETHAZINE HCL 12.5 MG PO TABS: 12.5000 mg | ORAL_TABLET | Freq: Three times a day (TID) | ORAL | Status: DC | PRN

## 2014-02-15 NOTE — Progress Notes (Addendum)
SATURATION QUALIFICATIONS: (This note is used to comply with regulatory documentation for home oxygen)  Patient Saturations on Room Air at Rest = 88%  Patient Saturations on Room Air while Ambulating = 89%  Patient Saturations on 2 Liters of oxygen while Ambulating = 94%  Please briefly explain why patient needs home oxygen:

## 2014-02-15 NOTE — Progress Notes (Signed)
CARE MANAGEMENT NOTE 02/15/2014  Patient:  Samantha Hinton, Samantha Hinton   Account Number:  [id number]  Date Initiated:  02/15/2014  Documentation initiated by:  [facility]  Subjective/Objective Assessment:   COPD exac     Action/Plan:   Anticipated DC Date:  02/15/2014   Anticipated DC Plan:  [place]  CM consult      Choice offered to / List presented to:     DME arranged  [facility]  NEBULIZER MACHINE      DME agency  [facility]        Status of service:  Completed, signed off Medicare Important Message given?  YES (If response is "NO", the following Medicare IM given date fields will be blank) Date Medicare IM given:  02/15/2014 Medicare IM given by:  [facility] Date Additional Medicare IM given:   Additional Medicare IM given by:    Discharge Disposition:  HOME/SELF CARE  Per UR Regulation:    If discussed at Long Length of Stay Meetings, dates discussed:    Comments:  02/15/2014 1310 NCM spoke pt and discussed oxygen and neb machine for home. Pt concerned she will have to keep oxygen for long period of time. NCM explained that she will need to follow up with PCP after dc and he will determine if she is medically ready to dc oxygen at home. NCM encouraged pt to also discuss smoking cessation programs. Contacted [facility] for DME for home. Jonnie Finner RN CCM Case Mgmt phone [phone]

## 2014-02-15 NOTE — Progress Notes (Signed)
Pt given discharge instructions, prescriptions, and PIV removed.  Oxygen tank in room.  Pt denied any other needs and no other questions at this time.  Pt taken to discharge location via wheelchair.

## 2014-02-15 NOTE — Discharge Summary (Signed)
Physician Discharge Summary  Samantha Hinton UYQ:[id number] DOB: 28-Oct-1955 DOA: 02/11/2014  PCP: [name], MD  Admit date: 02/11/2014 Discharge date: 02/15/2014  Time spent: greater than 30 minutes  Discharge Diagnoses:  Principal Problem:   COPD exacerbation Active Problems:   SOB (shortness of breath)   Hypoxemia   COPD GOLD II   Essential hypertension, benign   Hyperlipidemia   SIRS (systemic inflammatory response syndrome)   Thrush   Acute sinusitis   Discharge Condition: stable  Filed Weights   02/11/14 0603 02/14/14 0443 02/15/14 0543  Weight: 81.6 kg (179 lb 14.3 oz) 82.5 kg (181 lb 14.1 oz) 81.8 kg (180 lb 5.4 oz)    History of present illness/Hospital Course:  58 year old with history of COPD, hypertension and presented to the emergency department with cough and wheezing. Admitted for COPD exacerbation. Patient had been given a Z-Pak as well as prednisone taper by her primary care physician however her symptoms did not improve.   COPD Exacerbation  Started on steroids, antibiotics, oxygen and bronchodilators. Slowly improved and transitioned to oral steroids and antibiotics. Complained of sinus pressure and post nasal drip. Started on flonase with good results  Tobacco abuse Pt motivated to quit -Continue nicotine patch  Developed Swelling secondary to steroids, IVF Improved after diuretics.  Developed Thrush: improved with nystatin  Procedures  None  Consults  None  Discharge Exam: Filed Vitals:   02/15/14 0545  BP: 155/76  Pulse: 70  Temp: 98 F (36.7 C)  Resp: 18    General: comfortable Cardiovascular: RRR Respiratory: CTA Ext: trace edema  Discharge Instructions   Discharge Instructions    Diet - low sodium heart healthy    Complete by:  As directed      Increase activity slowly    Complete by:  As directed           Current Discharge Medication List    START taking these medications   Details  acetaminophen  (TYLENOL) 325 MG tablet Take 2 tablets (650 mg total) by mouth every 6 (six) hours as needed for mild pain (or Fever >/= 101).    benzonatate (TESSALON) 200 MG capsule Take 1 capsule (200 mg total) by mouth 3 (three) times daily as needed for cough. Qty: 20 capsule, Refills: 0    fluticasone (FLONASE) 50 MCG/ACT nasal spray Place 2 sprays into both nostrils daily. Refills: 2    HYDROcodone-acetaminophen (NORCO/VICODIN) 5-325 MG per tablet Take 1 tablet by mouth every 6 (six) hours as needed for moderate pain. Qty: 15 tablet, Refills: 0    levofloxacin (LEVAQUIN) 500 MG tablet Take 1 tablet (500 mg total) by mouth at bedtime. Qty: 3 tablet, Refills: 0    nystatin (MYCOSTATIN) 100000 UNIT/ML suspension Take 5 mLs (500,000 Units total) by mouth 4 (four) times daily. For thrush Qty: 60 mL, Refills: 0      CONTINUE these medications which have CHANGED   Details  budesonide-formoterol (SYMBICORT) 160-4.5 MCG/ACT inhaler Take 2 puffs first thing in am and then another 2 puffs about 12 hours later. Qty: 1 Inhaler, Refills: 0   Associated Diagnoses: COPD mixed type    predniSONE (DELTASONE) 10 MG tablet 4 tablets daily for 2 days, then decrease by 1 tablet q 2 days until off Qty: 20 tablet, Refills: 0    promethazine (PHENERGAN) 12.5 MG tablet Take 1 tablet (12.5 mg total) by mouth every 8 (eight) hours as needed for nausea or vomiting. Qty: 10 tablet, Refills: 0  CONTINUE these medications which have NOT CHANGED   Details  albuterol (PROAIR HFA) 108 (90 BASE) MCG/ACT inhaler Inhale 2 puffs into the lungs every 4 (four) hours as needed for wheezing or shortness of breath. 2 puffs every 4 hours as needed only  if your can't catch your breath Qty: 1 Inhaler, Refills: 11    albuterol (PROVENTIL) (2.5 MG/3ML) 0.083% nebulizer solution Take 3 mLs (2.5 mg total) by nebulization every 6 (six) hours as needed for wheezing. Qty: 75 mL, Refills: 1    bisoprolol-hydrochlorothiazide (ZIAC)  5-6.25 MG per tablet TAKE ONE TABLET BY MOUTH ONCE DAILY Qty: 90 tablet, Refills: 1    CINNAMON PO Take 1 tablet by mouth daily.     HYDROcodone-homatropine (HYCODAN) 5-1.5 MG/5ML syrup Take 5 mLs by mouth every 6 (six) hours as needed for cough. Qty: 120 mL, Refills: 0    ipratropium (ATROVENT) 0.02 % nebulizer solution Take 2.5 mLs (500 mcg total) by nebulization 4 (four) times daily as needed for wheezing. Qty: 75 mL, Refills: 1    Multiple Vitamins-Minerals (CENTRUM PO) Take 1 tablet by mouth daily.     lovastatin (MEVACOR) 40 MG tablet Take 1 tablet (40 mg total) by mouth at bedtime. Qty: 90 tablet, Refills: 1      STOP taking these medications     azithromycin (ZITHROMAX) 250 MG tablet        Allergies  Allergen Reactions  . Wellbutrin [Bupropion] Nausea And Vomiting   Follow-up Information    Follow up with [name], MD In 4 weeks.   Specialties:  Internal Medicine, Pediatrics   Contact information:   [place] [address] [phone]        The results of significant diagnostics from this hospitalization (including imaging, microbiology, ancillary and laboratory) are listed below for reference.    Significant Diagnostic Studies: Dg Chest Port 1 View  02/11/2014   CLINICAL DATA:  Acute onset of shortness of breath. Initial encounter.  EXAM: PORTABLE CHEST - 1 VIEW  COMPARISON:  Chest radiograph from 06/19/2013  FINDINGS: The lungs are well-aerated and clear. There is no evidence of focal opacification, pleural effusion or pneumothorax. The left costophrenic angle is not fully imaged on this study.  The cardiomediastinal silhouette is within normal limits. No acute osseous abnormalities are seen.  IMPRESSION: No acute cardiopulmonary process seen.   Electronically Signed   By: Garald Balding M.D.   On: 02/11/2014 02:02    Microbiology: Recent Results (from the past 240 hour(s))  Culture, blood (routine x 2)     Status: None  (Preliminary result)   Collection Time: 02/11/14  9:20 AM  Result Value Ref Range Status   Specimen Description BLOOD RIGHT HAND  Final   Special Requests BOTTLES DRAWN AEROBIC ONLY 5CC  Final   Culture   Final           BLOOD CULTURE RECEIVED NO GROWTH TO DATE CULTURE WILL BE HELD FOR 5 DAYS BEFORE ISSUING A FINAL NEGATIVE REPORT Performed at [facility]    Report Status PENDING  Incomplete  Culture, blood (routine x 2)     Status: None (Preliminary result)   Collection Time: 02/11/14  9:30 AM  Result Value Ref Range Status   Specimen Description BLOOD RIGHT ARM  Final   Special Requests BOTTLES DRAWN AEROBIC ONLY 3CC  Final   Culture   Final           BLOOD CULTURE RECEIVED NO GROWTH TO  DATE CULTURE WILL BE HELD FOR 5 DAYS BEFORE ISSUING A FINAL NEGATIVE REPORT Performed at [facility]    Report Status PENDING  Incomplete     Labs: Basic Metabolic Panel:  Recent Labs Lab 02/11/14 0114 02/12/14 0705  NA 134* 138  K 3.1* 3.8  CL 94* 95*  CO2 29 33*  GLUCOSE 96 94  BUN 15 12  CREATININE 0.86 0.81  CALCIUM 8.9 8.9   Liver Function Tests: No results for input(s): AST, ALT, ALKPHOS, BILITOT, PROT, ALBUMIN in the last 168 hours. No results for input(s): LIPASE, AMYLASE in the last 168 hours. No results for input(s): AMMONIA in the last 168 hours. CBC:  Recent Labs Lab 02/11/14 0114 02/12/14 0705 02/13/14 0425  WBC 18.2* 22.0* 19.8*  HGB 12.5 12.0 11.5*  HCT 37.7 37.7 36.2  MCV 90.0 92.6 93.1  PLT 330 343 347   Cardiac Enzymes: No results for input(s): CKTOTAL, CKMB, CKMBINDEX, TROPONINI in the last 168 hours. BNP: BNP (last 3 results) No results for input(s): PROBNP in the last 8760 hours. CBG: No results for input(s): GLUCAP in the last 168 hours.     SignedDelfina Redwood  [facility] 02/15/2014, 12:46 PM

## 2014-02-15 NOTE — Progress Notes (Signed)
Utilization review complete. Alda Gaultney RN CCM Case Mgmt phone [phone] 

## 2014-02-17 LAB — CULTURE, BLOOD (ROUTINE X 2)
CULTURE: NO GROWTH
Culture: NO GROWTH

## 2014-03-16 ENCOUNTER — Ambulatory Visit (INDEPENDENT_AMBULATORY_CARE_PROVIDER_SITE_OTHER): Payer: Medicare HMO | Admitting: Internal Medicine

## 2014-03-16 ENCOUNTER — Telehealth: Payer: Self-pay | Admitting: Family Medicine

## 2014-03-16 VITALS — BP 124/80 | HR 88 | Temp 97.7°F | Ht 65.0 in | Wt 183.1 lb

## 2014-03-16 DIAGNOSIS — J029 Acute pharyngitis, unspecified: Secondary | ICD-10-CM

## 2014-03-16 DIAGNOSIS — Z720 Tobacco use: Secondary | ICD-10-CM

## 2014-03-16 DIAGNOSIS — I10 Essential (primary) hypertension: Secondary | ICD-10-CM

## 2014-03-16 DIAGNOSIS — F17200 Nicotine dependence, unspecified, uncomplicated: Secondary | ICD-10-CM

## 2014-03-16 DIAGNOSIS — R110 Nausea: Secondary | ICD-10-CM

## 2014-03-16 DIAGNOSIS — J449 Chronic obstructive pulmonary disease, unspecified: Secondary | ICD-10-CM

## 2014-03-16 DIAGNOSIS — Z79899 Other long term (current) drug therapy: Secondary | ICD-10-CM

## 2014-03-16 DIAGNOSIS — Z09 Encounter for follow-up examination after completed treatment for conditions other than malignant neoplasm: Secondary | ICD-10-CM

## 2014-03-16 MED ORDER — BENZONATATE 200 MG PO CAPS: 200.0000 mg | ORAL_CAPSULE | Freq: Three times a day (TID) | ORAL | Status: DC | PRN

## 2014-03-16 MED ORDER — PROMETHAZINE HCL 12.5 MG PO TABS: 12.5000 mg | ORAL_TABLET | Freq: Three times a day (TID) | ORAL | Status: DC | PRN

## 2014-03-16 MED ORDER — NYSTATIN 100000 UNIT/ML MT SUSP: 500000.0000 [IU] | Freq: Four times a day (QID) | OROMUCOSAL | Status: DC

## 2014-03-16 NOTE — Progress Notes (Signed)
Pre visit review using our clinic review tool, if applicable. No additional management support is needed unless otherwise documented below in the visit note.  Chief Complaint  Patient presents with  . Hospitalization Follow-up    from december  copd sirs     HPI: Patient comes in today as follow up from hospitalization for AE of copd  12 24  With sinusitis and SIRS criteria  .Marland Kitchen   See dc summary below  Sent home with O2  96 and  98 ... [facility].  Breathing "awsome"  comparative   complares   [facility] wont come pick up the o2 unless has order .( ordered by hospital) Stopped tobacco since dc.  Was scared  Trying to eat healthy  May have some thrush left in throat rx for same in  hosp; has sore throat sx upper air  No fever ? Tender glands  Asks for refill tessalon if needed  Phenergan for nausea intermittent that never got to GI cause had illness interim. Mostly in am without   . ongoing , Things recnetly  has been good  ROS: See pertinent positives and negatives per HPI.no hemoptysis syncope weight loss    Past Medical History  Diagnosis Date  . COPD (chronic obstructive pulmonary disease)   . Hypertension   . Asthma   . Emphysema   . Hx of varicella   . Heartburn   . Genital warts   . High cholesterol   . Colon polyps   . 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   Marland Kitchen Hx: UTI (urinary tract infection)   . Pain management     hx of same in [place]  multiple controlled substances that she weaned off 4 years ago cand not any worse takes advil as bneeded   . Smoker 02/17/2012    Limits of effective care discussed 08/01/12 / pulmonary clinic/ [facility] Stopped 12 15 withhospitalization   . SIRS (systemic inflammatory response syndrome) 02/11/2014    Family History  Problem Relation Age of Onset  . CAD Father     tumor between heart and lung   . CAD Sister     ? dx   . Hypertension Father   . Hypertension Mother   . Lung cancer Mother     died 13   . Diabetes Maternal Grandmother    late 45s     History   Social History  . Marital Status: Widowed    Spouse Name: N/A    Number of Children: 2  . Years of Education: N/A   Occupational History  . retired    Social History Main Topics  . Smoking status: Former Smoker -- 0.25 packs/day for 44 years    Types: Cigarettes    Quit date: 02/12/2014  . Smokeless tobacco: Never Used  . Alcohol Use: 2.4 oz/week    4 Not specified per week  . Drug Use: No  . Sexual Activity: None   Other Topics Concern  . None   Social History Narrative   6-8 hours of sleep per night   Lives with her fiance   1 dog in the home   Retired no ets  But tob 8 per day   etoh ocass 1-2    On disability from her neck surgery predicaments .   orig from [employer] in [place] area.    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 2   Last Pap 2006 last  mammogram 2000 and   Has dentures   FA     Outpatient Encounter Prescriptions as of 03/16/2014  Medication Sig  . albuterol (PROAIR HFA) 108 (90 BASE) MCG/ACT inhaler Inhale 2 puffs into the lungs every 4 (four) hours as needed for wheezing or shortness of breath. 2 puffs every 4 hours as needed only  if your can't catch your breath  . albuterol (PROVENTIL) (2.5 MG/3ML) 0.083% nebulizer solution Take 3 mLs (2.5 mg total) by nebulization every 6 (six) hours as needed for wheezing.  . benzonatate (TESSALON) 200 MG capsule Take 1 capsule (200 mg total) by mouth 3 (three) times daily as needed for cough.  . bisoprolol-hydrochlorothiazide (ZIAC) 5-6.25 MG per tablet TAKE ONE TABLET BY MOUTH ONCE DAILY  . budesonide-formoterol (SYMBICORT) 160-4.5 MCG/ACT inhaler Take 2 puffs first thing in am and then another 2 puffs about 12 hours later.  Marland Kitchen CINNAMON PO Take 1 tablet by mouth daily.   Marland Kitchen ipratropium (ATROVENT) 0.02 % nebulizer solution Take 2.5 mLs (500 mcg total) by nebulization 4 (four) times daily as needed for wheezing.  . lovastatin (MEVACOR) 40 MG tablet Take 1 tablet (40 mg  total) by mouth at bedtime.  . Multiple Vitamins-Minerals (CENTRUM PO) Take 1 tablet by mouth daily.   . promethazine (PHENERGAN) 12.5 MG tablet Take 1 tablet (12.5 mg total) by mouth every 8 (eight) hours as needed for nausea or vomiting.  . [DISCONTINUED] benzonatate (TESSALON) 200 MG capsule Take 1 capsule (200 mg total) by mouth 3 (three) times daily as needed for cough.  . [DISCONTINUED] promethazine (PHENERGAN) 12.5 MG tablet Take 1 tablet (12.5 mg total) by mouth every 8 (eight) hours as needed for nausea or vomiting.  . nystatin (MYCOSTATIN) 100000 UNIT/ML suspension Take 5 mLs (500,000 Units total) by mouth 4 (four) times daily.  . [DISCONTINUED] acetaminophen (TYLENOL) 325 MG tablet Take 2 tablets (650 mg total) by mouth every 6 (six) hours as needed for mild pain (or Fever >/= 101).  . [DISCONTINUED] fluticasone (FLONASE) 50 MCG/ACT nasal spray Place 2 sprays into both nostrils daily.  . [DISCONTINUED] HYDROcodone-acetaminophen (NORCO/VICODIN) 5-325 MG per tablet Take 1 tablet by mouth every 6 (six) hours as needed for moderate pain.  . [DISCONTINUED] HYDROcodone-homatropine (HYCODAN) 5-1.5 MG/5ML syrup Take 5 mLs by mouth every 6 (six) hours as needed for cough.  . [DISCONTINUED] levofloxacin (LEVAQUIN) 500 MG tablet Take 1 tablet (500 mg total) by mouth at bedtime.  . [DISCONTINUED] nystatin (MYCOSTATIN) 100000 UNIT/ML suspension Take 5 mLs (500,000 Units total) by mouth 4 (four) times daily. For thrush  . [DISCONTINUED] predniSONE (DELTASONE) 10 MG tablet 4 tablets daily for 2 days, then decrease by 1 tablet q 2 days until off    EXAM:  BP 124/80 mmHg  Pulse 88  Temp(Src) 97.7 F (36.5 C) (Oral)  Ht 5\' 5"  (1.651 m)  Wt 183 lb 1.6 oz (83.054 kg)  BMI 30.47 kg/m2  SpO2 95%  Body mass index is 30.47 kg/(m^2).  GENERAL: vitals reviewed and listed above, alert, oriented, appears well hydrated and in no acute distress HEENT: atraumatic, conjunctiva  clear, no obvious  abnormalities on inspection of external nose and ears tms clear OP : no lesion edema or exudate  Small amount of wht patch post pharynx  NECK: no obvious masses on inspection palpation tender ac area no masses  LUNGS: clear to auscultation bilaterally, no wheezes, rales or rhonchi,  Abdomen:  Sof,t normal bowel sounds without hepatosplenomegaly, no guarding rebound or masses no  CVA tenderness CV: HRRR, no clubbing cyanosis or  peripheral edema nl cap refill  MS: moves all extremities without noticeable focal  abnormality PSYCH: pleasant and cooperative, no obvious depression or anxiety Lab reviewed repeat cbc elevated in and steroids  ASSESSMENT AND PLAN:  Discussed the following assessment and plan:  COPD GOLD II - stopped tobacco post hosp i agree no need for o2 at home dc order .   Hospital discharge follow-up  Sore throat - poss residual  thrush based on exam and hx   nystatin solution trial  Nausea without vomiting - on going  refill phenergan x 1 plan gi referral. - Plan: Ambulatory referral to Gastroenterology  Essential hypertension, benign  Smoker now stopped   Medication management  -Patient advised to return or notify health care team  if symptoms worsen ,persist or new concerns arise. Total visit 34mins > 50% spent counseling and coordinating care     Patient Instructions  Congratulation on stopping tobacco  Will send in order to d/c O2.  Stay on symbicort.   Blood pressure is good.  Keep appt   In June  Add cbc  diff to  The blood tests.       Standley Brooking. Aryaan Persichetti M.D.  Admit date: 02/11/2014 Discharge date: 02/15/2014  Time spent: greater than 30 minutes  Discharge Diagnoses:  Principal Problem:  COPD exacerbation Active Problems:  SOB (shortness of breath)  Hypoxemia  COPD GOLD II  Essential hypertension, benign  Hyperlipidemia  SIRS (systemic inflammatory response syndrome)  Thrush  Acute sinusitis   Discharge Condition:  stable  Filed Weights   02/11/14 0603 02/14/14 0443 02/15/14 0543  Weight: 81.6 kg (179 lb 14.3 oz) 82.5 kg (181 lb 14.1 oz) 81.8 kg (180 lb 5.4 oz)    History of present illness/Hospital Course:  59 year old with history of COPD, hypertension and presented to the emergency department with cough and wheezing. Admitted for COPD exacerbation. Patient had been given a Z-Pak as well as prednisone taper by her primary care physician however her symptoms did not improve.   COPD Exacerbation  Started on steroids, antibiotics, oxygen and bronchodilators. Slowly improved and transitioned to oral steroids and antibiotics. Complained of sinus pressure and post nasal drip. Started on flonase with good results  Tobacco abuse Pt motivated to quit -Continue nicotine patch  Developed Swelling secondary to steroids, IVF Improved after diuretics.  Developed Thrush: improved with nystatin  Procedures  None  Consults  None  Discharge Exam: Filed Vitals:   02/15/14 0545  BP: 155/76  Pulse: 70  Temp: 98 F (36.7 C)  Resp: 18    General: comfortable Cardiovascular: RRR Respiratory: CTA Ext: trace edema  Discharge Instructions   Discharge Instructions    Diet - low sodium heart healthy  Complete by: As directed      Increase activity slowly  Complete by: As directed           Current Discharge Medication List    START taking these medications   Details  acetaminophen (TYLENOL) 325 MG tablet Take 2 tablets (650 mg total) by mouth every 6 (six) hours as needed for mild pain (or Fever >/= 101).    benzonatate (TESSALON) 200 MG capsule Take 1 capsule (200 mg total) by mouth 3 (three) times daily as needed for cough. Qty: 20 capsule, Refills: 0    fluticasone (FLONASE) 50 MCG/ACT nasal spray Place 2 sprays into both nostrils daily. Refills: 2    HYDROcodone-acetaminophen (NORCO/VICODIN) 5-325 MG per tablet  Take 1  tablet by mouth every 6 (six) hours as needed for moderate pain. Qty: 15 tablet, Refills: 0    levofloxacin (LEVAQUIN) 500 MG tablet Take 1 tablet (500 mg total) by mouth at bedtime. Qty: 3 tablet, Refills: 0    nystatin (MYCOSTATIN) 100000 UNIT/ML suspension Take 5 mLs (500,000 Units total) by mouth 4 (four) times daily. For thrush Qty: 60 mL, Refills: 0      CONTINUE these medications which have CHANGED   Details  budesonide-formoterol (SYMBICORT) 160-4.5 MCG/ACT inhaler Take 2 puffs first thing in am and then another 2 puffs about 12 hours later. Qty: 1 Inhaler, Refills: 0   Associated Diagnoses: COPD mixed type    predniSONE (DELTASONE) 10 MG tablet 4 tablets daily for 2 days, then decrease by 1 tablet q 2 days until off Qty: 20 tablet, Refills: 0    promethazine (PHENERGAN) 12.5 MG tablet Take 1 tablet (12.5 mg total) by mouth every 8 (eight) hours as needed for nausea or vomiting. Qty: 10 tablet, Refills: 0      CONTINUE these medications which have NOT CHANGED   Details  albuterol (PROAIR HFA) 108 (90 BASE) MCG/ACT inhaler Inhale 2 puffs into the lungs every 4 (four) hours as needed for wheezing or shortness of breath. 2 puffs every 4 hours as needed only if your can't catch your breath Qty: 1 Inhaler, Refills: 11    albuterol (PROVENTIL) (2.5 MG/3ML) 0.083% nebulizer solution Take 3 mLs (2.5 mg total) by nebulization every 6 (six) hours as needed for wheezing. Qty: 75 mL, Refills: 1    bisoprolol-hydrochlorothiazide (ZIAC) 5-6.25 MG per tablet TAKE ONE TABLET BY MOUTH ONCE DAILY Qty: 90 tablet, Refills: 1    CINNAMON PO Take 1 tablet by mouth daily.     HYDROcodone-homatropine (HYCODAN) 5-1.5 MG/5ML syrup Take 5 mLs by mouth every 6 (six) hours as needed for cough. Qty: 120 mL, Refills: 0    ipratropium (ATROVENT) 0.02 % nebulizer solution Take 2.5 mLs (500 mcg total) by nebulization 4 (four) times daily as needed for  wheezing. Qty: 75 mL, Refills: 1    Multiple Vitamins-Minerals (CENTRUM PO) Take 1 tablet by mouth daily.     lovastatin (MEVACOR) 40 MG tablet Take 1 tablet (40 mg total) by mouth at bedtime. Qty: 90 tablet, Refills: 1      STOP taking these medications     azithromycin (ZITHROMAX) 250 MG tablet        Allergies  Allergen Reactions  . Wellbutrin [Bupropion] Nausea And Vomiting   Follow-up Information    Follow up with [name], MD In 4 weeks.   Specialties: Internal Medicine, Pediatrics   Contact information:   [address] [place] [address] [phone]         The results of significant diagnostics from this hospitalization (including imaging, microbiology, ancillary and laboratory) are listed below for reference.    Significant Diagnostic Studies:  Imaging Results    Dg Chest Port 1 View  02/11/2014 CLINICAL DATA: Acute onset of shortness of breath. Initial encounter. EXAM: PORTABLE CHEST - 1 VIEW COMPARISON: Chest radiograph from 06/19/2013 FINDINGS: The lungs are well-aerated and clear. There is no evidence of focal opacification, pleural effusion or pneumothorax. The left costophrenic angle is not fully imaged on this study. The cardiomediastinal silhouette is within normal limits. No acute osseous abnormalities are seen. IMPRESSION: No acute cardiopulmonary process seen. Electronically Signed By: Garald Balding M.D. On: 02/11/2014 02:02     Microbiology: Recent Results (  from the past 240 hour(s))  Culture, blood (routine x 2) Status: None (Preliminary result)   Collection Time: 02/11/14 9:20 AM  Result Value Ref Range Status   Specimen Description BLOOD RIGHT HAND  Final   Special Requests BOTTLES DRAWN AEROBIC ONLY 5CC  Final   Culture   Final     BLOOD CULTURE RECEIVED NO GROWTH TO DATE CULTURE WILL BE HELD FOR 5 DAYS BEFORE ISSUING A FINAL NEGATIVE  REPORT Performed at [facility]    Report Status PENDING  Incomplete  Culture, blood (routine x 2) Status: None (Preliminary result)   Collection Time: 02/11/14 9:30 AM  Result Value Ref Range Status   Specimen Description BLOOD RIGHT ARM  Final   Special Requests BOTTLES DRAWN AEROBIC ONLY 3CC  Final   Culture   Final     BLOOD CULTURE RECEIVED NO GROWTH TO DATE CULTURE WILL BE HELD FOR 5 DAYS BEFORE ISSUING A FINAL NEGATIVE REPORT Performed at [facility]    Report Status PENDING  Incomplete     Labs: Basic Metabolic Panel:  Last Labs      Recent Labs Lab 02/11/14 0114 02/12/14 0705  NA 134* 138  K 3.1* 3.8  CL 94* 95*  CO2 29 33*  GLUCOSE 96 94  BUN 15 12  CREATININE 0.86 0.81  CALCIUM 8.9 8.9     Liver Function Tests:  Last Labs     No results for input(s): AST, ALT, ALKPHOS, BILITOT, PROT, ALBUMIN in the last 168 hours.    Last Labs     No results for input(s): LIPASE, AMYLASE in the last 168 hours.    Last Labs     No results for input(s): AMMONIA in the last 168 hours.   CBC:  Last Labs      Recent Labs Lab 02/11/14 0114 02/12/14 0705 02/13/14 0425  WBC 18.2* 22.0* 19.8*  HGB 12.5 12.0 11.5*  HCT 37.7 37.7 36.2  MCV 90.0 92.6 93.1  PLT 330 343 347     Cardiac Enzymes:  Last Labs     No results for input(s): CKTOTAL, CKMB, CKMBINDEX, TROPONINI in the last 168 hours.   BNP: BNP (last 3 results)  Recent Labs (within last 365 days)    No results for input(s): PROBNP in the last 8760 hours.   CBG:  Last Labs     No results for input(s): GLUCAP in the last 168 hours.       SignedDelfina Redwood [facility] 02/15/2014, 12:46 PM

## 2014-03-16 NOTE — Telephone Encounter (Signed)
Needs a d/c order sent for oxygen.  Will need to call the pt and find out the name of DME company.

## 2014-03-16 NOTE — Patient Instructions (Signed)
Congratulation on stopping tobacco  Will send in order to d/c O2.  Stay on symbicort.   Blood pressure is good.  Keep appt   In June  Add cbc  diff to  The blood tests.

## 2014-03-17 ENCOUNTER — Other Ambulatory Visit (INDEPENDENT_AMBULATORY_CARE_PROVIDER_SITE_OTHER): Payer: Medicare HMO

## 2014-03-17 DIAGNOSIS — E785 Hyperlipidemia, unspecified: Secondary | ICD-10-CM

## 2014-03-17 DIAGNOSIS — D649 Anemia, unspecified: Secondary | ICD-10-CM

## 2014-03-17 LAB — CBC WITH DIFFERENTIAL/PLATELET
BASOS PCT: 0.2 % (ref 0.0–3.0)
Basophils Absolute: 0 10*3/uL (ref 0.0–0.1)
EOS PCT: 0.7 % (ref 0.0–5.0)
Eosinophils Absolute: 0.1 10*3/uL (ref 0.0–0.7)
HEMATOCRIT: 40.8 % (ref 36.0–46.0)
Hemoglobin: 13.4 g/dL (ref 12.0–15.0)
Lymphocytes Relative: 32.6 % (ref 12.0–46.0)
Lymphs Abs: 3.5 10*3/uL (ref 0.7–4.0)
MCHC: 33 g/dL (ref 30.0–36.0)
MCV: 89.7 fl (ref 78.0–100.0)
Monocytes Absolute: 0.7 10*3/uL (ref 0.1–1.0)
Monocytes Relative: 6.7 % (ref 3.0–12.0)
NEUTROS PCT: 59.8 % (ref 43.0–77.0)
Neutro Abs: 6.4 10*3/uL (ref 1.4–7.7)
PLATELETS: 218 10*3/uL (ref 150.0–400.0)
RBC: 4.55 Mil/uL (ref 3.87–5.11)
RDW: 14.7 % (ref 11.5–15.5)
WBC: 10.7 10*3/uL — ABNORMAL HIGH (ref 4.0–10.5)

## 2014-03-17 LAB — LIPID PANEL
CHOL/HDL RATIO: 3
CHOLESTEROL: 204 mg/dL — AB (ref 0–200)
HDL: 67.4 mg/dL (ref >39.00)
NonHDL: 136.6
TRIGLYCERIDES: 231 mg/dL — AB (ref 0.0–149.0)
VLDL: 46.2 mg/dL — AB (ref 0.0–40.0)

## 2014-03-17 LAB — LDL CHOLESTEROL, DIRECT: Direct LDL: 122 mg/dL

## 2014-03-20 ENCOUNTER — Encounter: Payer: Self-pay | Admitting: Internal Medicine

## 2014-03-20 DIAGNOSIS — R110 Nausea: Secondary | ICD-10-CM | POA: Insufficient documentation

## 2014-03-20 NOTE — Assessment & Plan Note (Signed)
Stopped dec 15

## 2014-03-22 ENCOUNTER — Encounter: Payer: Self-pay | Admitting: Gastroenterology

## 2014-03-30 NOTE — Telephone Encounter (Signed)
An order has been faxed to [facility] at [phone].  Received fax transmission that the fax was successful.

## 2014-03-30 NOTE — Telephone Encounter (Signed)
LMOM for the pt to return my call.

## 2014-05-07 ENCOUNTER — Ambulatory Visit (INDEPENDENT_AMBULATORY_CARE_PROVIDER_SITE_OTHER): Payer: Medicare HMO | Admitting: Gastroenterology

## 2014-05-07 ENCOUNTER — Encounter: Payer: Self-pay | Admitting: Gastroenterology

## 2014-05-07 VITALS — BP 120/78 | HR 89 | Ht 66.0 in | Wt 188.4 lb

## 2014-05-07 DIAGNOSIS — R110 Nausea: Secondary | ICD-10-CM

## 2014-05-07 DIAGNOSIS — J449 Chronic obstructive pulmonary disease, unspecified: Secondary | ICD-10-CM | POA: Diagnosis not present

## 2014-05-07 DIAGNOSIS — Z1211 Encounter for screening for malignant neoplasm of colon: Secondary | ICD-10-CM | POA: Diagnosis not present

## 2014-05-07 MED ORDER — NA SULFATE-K SULFATE-MG SULF 17.5-3.13-1.6 GM/177ML PO SOLN: 1.0000 | Freq: Once | ORAL | Status: DC

## 2014-05-07 MED ORDER — OMEPRAZOLE 20 MG PO CPDR: 20.0000 mg | DELAYED_RELEASE_CAPSULE | Freq: Every day | ORAL | Status: DC

## 2014-05-07 NOTE — Assessment & Plan Note (Signed)
Patient has free-floating nausea exacerbated by eating.  Symptoms suggest a medication-effect although she is not on any obvious medications that typically cause nausea.  Gastroparesis, ulcer or nonulcer dyspepsia are considerations.  Recommendations #1 upper endoscopy #2 gastric empty scan #1 is not diagnostic #3 trial omeprazole 20 g daily

## 2014-05-07 NOTE — Patient Instructions (Signed)
You have been scheduled for an endoscopy. Please follow written instructions given to you at your visit today. If you use inhalers (even only as needed), please bring them with you on the day of your procedure. Your physician has requested that you go to www.[email or web address] and enter the access code given to you at your visit today. This web site gives a general overview about your procedure. However, you should still follow specific instructions given to you by our office regarding your preparation for the procedure.  You have been scheduled for a colonoscopy. Please follow written instructions given to you at your visit today.  Please pick up your prep supplies at the pharmacy within the next 1-3 days. If you use inhalers (even only as needed), please bring them with you on the day of your procedure. Your physician has requested that you go to www.[email or web address] and enter the access code given to you at your visit today. This web site gives a general overview about your procedure. However, you should still follow specific instructions given to you by our office regarding your preparation for the procedure.

## 2014-05-07 NOTE — Progress Notes (Signed)
_                                                                                                                History of Present Illness:  Samantha Hinton is a 59 year old white female referred at the request of Dr. Sanda Linger evaluation of nausea.  This has been an ongoing problem for years.  She has free standing nausea that is exacerbated by eating.  She also complains of postprandial abdominal distention and generalized discomfort.  She denies dysphagia or pyrosis.  She takes Zantac intermittently.  Weight has been stable.   Past Medical History  Diagnosis Date  . COPD (chronic obstructive pulmonary disease)   . Hypertension   . Asthma   . Emphysema   . Hx of varicella   . Heartburn   . Genital warts   . High cholesterol   . Colon polyps   . 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   Marland Kitchen Hx: UTI (urinary tract infection)   . Pain management     hx of same in [place]  multiple controlled substances that she weaned off 4 years ago cand not any worse takes advil as bneeded   . Smoker 02/17/2012    Limits of effective care discussed 08/01/12 / pulmonary clinic/ [facility] Stopped 12 15 withhospitalization   . SIRS (systemic inflammatory response syndrome) 02/11/2014   Past Surgical History  Procedure Laterality Date  . Abdominal hysterectomy  1983     for fibroids  sitll has one ovary  age 61   . Cholecystectomy  1983  . Neck surgery  [id number]    c spine ant and post hx fusion and removal or harcdware and shavings  . Back surgery    . Appendectomy  1967  . Breast surgery      diseased milk glands  . Tonsillectomy     family history includes CAD in her father and sister; Diabetes in her maternal grandmother; Hypertension in her father and mother; Lung cancer in her mother. Current Outpatient Prescriptions  Medication Sig Dispense Refill  . albuterol (PROAIR HFA) 108 (90 BASE) MCG/ACT inhaler Inhale 2 puffs into the lungs every 4 (four) hours as needed for wheezing or shortness of  breath. 2 puffs every 4 hours as needed only  if your can't catch your breath 1 Inhaler 11  . albuterol (PROVENTIL) (2.5 MG/3ML) 0.083% nebulizer solution Take 3 mLs (2.5 mg total) by nebulization every 6 (six) hours as needed for wheezing. 75 mL 1  . benzonatate (TESSALON) 200 MG capsule Take 1 capsule (200 mg total) by mouth 3 (three) times daily as needed for cough. (Patient taking differently: Take 200 mg by mouth as needed for cough. ) 30 capsule 0  . bisoprolol-hydrochlorothiazide (ZIAC) 5-6.25 MG per tablet TAKE ONE TABLET BY MOUTH ONCE DAILY 90 tablet 1  . budesonide-formoterol (SYMBICORT) 160-4.5 MCG/ACT inhaler Take 2 puffs first thing in am and then another 2 puffs about 12 hours later. 1 Inhaler 0  . CINNAMON PO  Take 1 tablet by mouth daily.     Marland Kitchen ipratropium (ATROVENT) 0.02 % nebulizer solution Take 2.5 mLs (500 mcg total) by nebulization 4 (four) times daily as needed for wheezing. 75 mL 1  . lovastatin (MEVACOR) 40 MG tablet Take 1 tablet (40 mg total) by mouth at bedtime. 90 tablet 1  . Multiple Vitamins-Minerals (CENTRUM PO) Take 1 tablet by mouth daily.     Marland Kitchen nystatin (MYCOSTATIN) 100000 UNIT/ML suspension Take 5 mLs (500,000 Units total) by mouth 4 (four) times daily. 60 mL 0  . promethazine (PHENERGAN) 12.5 MG tablet Take 1 tablet (12.5 mg total) by mouth every 8 (eight) hours as needed for nausea or vomiting. 20 tablet 0   No current facility-administered medications for this visit.   Allergies as of 05/07/2014 - Review Complete 05/07/2014  Allergen Reaction Noted  . Wellbutrin [bupropion] Nausea And Vomiting 02/17/2012    reports that she quit smoking about 2 months ago. Her smoking use included Cigarettes. She has a 11 pack-year smoking history. She has never used smokeless tobacco. She reports that she drinks about 2.4 oz of alcohol per week. She reports that she does not use illicit drugs.   Review of Systems: She has a history of multiple lipomas, some which have been  removed Pertinent positive and negative review of systems were noted in the above HPI section. All other review of systems were otherwise negative.  Vital signs were reviewed in today's medical record Physical Exam: General: Well developed , well nourished, no acute distress Skin: anicteric; there is a scaly rash over the tops of her feet Head: Normocephalic and atraumatic Eyes:  sclerae anicteric, EOMI Ears: Normal auditory acuity Mouth: No deformity or lesions Neck: Supple, no masses or thyromegaly Lungs: There are diffuse, few expiratory wheezes Heart: Regular rate and rhythm; no murmurs, rubs or bruits Abdomen: Soft, non tender and non distended. No masses, hepatosplenomegaly or hernias noted. Normal Bowel sounds.  There is no succussion splash Rectal:deferred Musculoskeletal: Symmetrical with no gross deformities  Skin: No lesions on visible extremities Pulses:  Normal pulses noted Extremities: No clubbing, cyanosis, edema or deformities noted Neurological: Alert oriented x 4, grossly nonfocal Cervical Nodes:  No significant cervical adenopathy Inguinal Nodes: No significant inguinal adenopathy Psychological:  Alert and cooperative. Normal mood and affect  See Assessment and Plan under Problem List

## 2014-05-07 NOTE — Assessment & Plan Note (Signed)
Last colonoscopy was over 15 years ago.  Plan follow-up exam.

## 2014-05-12 ENCOUNTER — Encounter: Payer: Self-pay | Admitting: Gastroenterology

## 2014-05-12 ENCOUNTER — Ambulatory Visit (AMBULATORY_SURGERY_CENTER): Payer: Medicare HMO | Admitting: Gastroenterology

## 2014-05-12 VITALS — BP 140/72 | HR 71 | Temp 98.2°F | Resp 16 | Ht 66.0 in | Wt 188.0 lb

## 2014-05-12 DIAGNOSIS — R110 Nausea: Secondary | ICD-10-CM

## 2014-05-12 MED ORDER — SODIUM CHLORIDE 0.9 % IV SOLN: 500.0000 mL | INTRAVENOUS | Status: DC

## 2014-05-12 NOTE — Progress Notes (Signed)
Report to PACU, RN, vss, BBS= Clear.  

## 2014-05-12 NOTE — Progress Notes (Signed)
Called to room to assist during endoscopic procedure.  Patient ID and intended procedure confirmed with present staff. Received instructions for my participation in the procedure from the performing physician.  

## 2014-05-12 NOTE — Op Note (Signed)
[facility]  [address] [place], [address]   ENDOSCOPY PROCEDURE REPORT  PATIENT: Samantha Hinton, Samantha Hinton  MR#: [id number] BIRTHDATE: [birth year] , 61  yrs. old GENDER: female ENDOSCOPIST: Inda Castle, MD REFERRED BY:  Burnis Medin, M.D. PROCEDURE DATE:  05/12/2014 PROCEDURE:  EGD w/ biopsy ASA CLASS:     Class III INDICATIONS:  nausea. MEDICATIONS: Monitored anesthesia care, Propofol 200 mg IV, and Simethicone 40mg  PO TOPICAL ANESTHETIC:  DESCRIPTION OF PROCEDURE: After the risks benefits and alternatives of the procedure were thoroughly explained, informed consent was obtained.  The LB [id number] endoscope was introduced through the mouth and advanced to the second portion of the duodenum , Without limitations.  The instrument was slowly withdrawn as the mucosa was fully examined.    ESOPHAGUS: There was a 3cm segment of suspected Barrett's esophagus found in the distal esophagus.  [place] of gastric appearing mucosa extending approximately 3 cm proximally.  Multiple biopsies were taken.   Except for the findings listed, the EGD was otherwise normal.  Retroflexed views revealed no abnormalities.     The scope was then withdrawn from the patient and the procedure completed.  COMPLICATIONS: There were no immediate complications.  ENDOSCOPIC IMPRESSION: 1.   There was a 3cm segment of suspected Barrett's esophagus found in the distal esophagus 2.   EGD was otherwise normal  RECOMMENDATIONS: 1.  Await biopsy results 2.  Continue current meds 3.  My office will arrange for you to have a Gastric Emptying Scan performed.  This is a radiology test that gives an idea of how well your stomach functions.  REPEAT EXAM:  eSigned:  Inda Castle, MD 05/12/2014 10:43 AM    CC:

## 2014-05-12 NOTE — Patient Instructions (Signed)
DR Kelby Fam OFFICE WILL CALL YOU WITH A SCHEDULED GASTRIC EMPTYING STUDY.   YOU HAD AN ENDOSCOPIC PROCEDURE TODAY AT [facility]:   Refer to the procedure report that was given to you for any specific questions about what was found during the examination.  If the procedure report does not answer your questions, please call your gastroenterologist to clarify.  If you requested that your care partner not be given the details of your procedure findings, then the procedure report has been included in a sealed envelope for you to review at your convenience later.  YOU SHOULD EXPECT: Some feelings of bloating in the abdomen. Passage of more gas than usual.  Walking can help get rid of the air that was put into your GI tract during the procedure and reduce the bloating. If you had a lower endoscopy (such as a colonoscopy or flexible sigmoidoscopy) you may notice spotting of blood in your stool or on the toilet paper. If you underwent a bowel prep for your procedure, you may not have a normal bowel movement for a few days.  Please Note:  You might notice some irritation and congestion in your nose or some drainage.  This is from the oxygen used during your procedure.  There is no need for concern and it should clear up in a day or so.  SYMPTOMS TO REPORT IMMEDIATELY:  Following upper endoscopy (EGD)  Vomiting of blood or coffee ground material  New chest pain or pain under the shoulder blades  Painful or persistently difficult swallowing  New shortness of breath  Fever of 100F or higher  Black, tarry-looking stools  For urgent or emergent issues, a gastroenterologist can be reached at any hour by calling [phone].   DIET: Your first meal following the procedure should be a small meal and then it is ok to progress to your normal diet. Heavy or fried foods are harder to digest and may make you feel nauseous or bloated.  Likewise, meals heavy in dairy and vegetables can increase  bloating.  Drink plenty of fluids but you should avoid alcoholic beverages for 24 hours.  ACTIVITY:  You should plan to take it easy for the rest of today and you should NOT DRIVE or use heavy machinery until tomorrow (because of the sedation medicines used during the test).    FOLLOW UP: Our staff will call the number listed on your records the next business day following your procedure to check on you and address any questions or concerns that you may have regarding the information given to you following your procedure. If we do not reach you, we will leave a message.  However, if you are feeling well and you are not experiencing any problems, there is no need to return our call.  We will assume that you have returned to your regular daily activities without incident.  If any biopsies were taken you will be contacted by phone or by letter within the next 1-3 weeks.  Please call us at [phone] if you have not heard about the biopsies in 3 weeks.    SIGNATURES/CONFIDENTIALITY: You and/or your care partner have signed paperwork which will be entered into your electronic medical record.  These signatures attest to the fact that that the information above on your After Visit Summary has been reviewed and is understood.  Full responsibility of the confidentiality of this discharge information lies with you and/or your care-partner.

## 2014-05-13 ENCOUNTER — Telehealth: Payer: Self-pay

## 2014-05-13 NOTE — Telephone Encounter (Signed)
  Follow up Call-  Call back number 05/12/2014  Post procedure Call Back phone  # [phone]  Permission to leave phone message Yes     Patient questions:  Do you have a fever, pain , or abdominal swelling? No. Pain Score  0 *  Have you tolerated food without any problems? Yes.    Have you been able to return to your normal activities? Yes.    Do you have any questions about your discharge instructions: Diet   No. Medications  No. Follow up visit  No.  Do you have questions or concerns about your Care? No.  Actions: * If pain score is 4 or above: No action needed, pain <4.  No problems per the pt. maw

## 2014-05-18 ENCOUNTER — Other Ambulatory Visit: Payer: Self-pay

## 2014-05-18 DIAGNOSIS — R112 Nausea with vomiting, unspecified: Secondary | ICD-10-CM

## 2014-05-20 ENCOUNTER — Encounter: Payer: Self-pay | Admitting: Gastroenterology

## 2014-05-24 ENCOUNTER — Encounter: Payer: Self-pay | Admitting: Internal Medicine

## 2014-05-24 ENCOUNTER — Encounter: Payer: Self-pay | Admitting: Gastroenterology

## 2014-05-24 ENCOUNTER — Ambulatory Visit (INDEPENDENT_AMBULATORY_CARE_PROVIDER_SITE_OTHER): Payer: Medicare HMO | Admitting: Internal Medicine

## 2014-05-24 VITALS — BP 136/82 | Temp 98.3°F | Ht 64.5 in | Wt 178.5 lb

## 2014-05-24 DIAGNOSIS — T887XXA Unspecified adverse effect of drug or medicament, initial encounter: Secondary | ICD-10-CM

## 2014-05-24 DIAGNOSIS — Z0000 Encounter for general adult medical examination without abnormal findings: Secondary | ICD-10-CM

## 2014-05-24 DIAGNOSIS — E785 Hyperlipidemia, unspecified: Secondary | ICD-10-CM | POA: Diagnosis not present

## 2014-05-24 DIAGNOSIS — L309 Dermatitis, unspecified: Secondary | ICD-10-CM | POA: Diagnosis not present

## 2014-05-24 DIAGNOSIS — F17200 Nicotine dependence, unspecified, uncomplicated: Secondary | ICD-10-CM

## 2014-05-24 DIAGNOSIS — Z23 Encounter for immunization: Secondary | ICD-10-CM

## 2014-05-24 DIAGNOSIS — Z114 Encounter for screening for human immunodeficiency virus [HIV]: Secondary | ICD-10-CM | POA: Diagnosis not present

## 2014-05-24 DIAGNOSIS — Z720 Tobacco use: Secondary | ICD-10-CM

## 2014-05-24 DIAGNOSIS — T50905A Adverse effect of unspecified drugs, medicaments and biological substances, initial encounter: Secondary | ICD-10-CM

## 2014-05-24 DIAGNOSIS — J449 Chronic obstructive pulmonary disease, unspecified: Secondary | ICD-10-CM | POA: Diagnosis not present

## 2014-05-24 DIAGNOSIS — Z79899 Other long term (current) drug therapy: Secondary | ICD-10-CM

## 2014-05-24 DIAGNOSIS — I10 Essential (primary) hypertension: Secondary | ICD-10-CM | POA: Diagnosis not present

## 2014-05-24 MED ORDER — FLUOCINONIDE-E 0.05 % EX CREA: 1.0000 "application " | TOPICAL_CREAM | Freq: Two times a day (BID) | CUTANEOUS | Status: DC

## 2014-05-24 NOTE — Patient Instructions (Addendum)
congrats on tobacco free  Continue lifestyle intervention healthy eating and exercise . Yearly flu  vaccine  Healthy lifestyle includes : At least 150 minutes of exercise weeks  , weight at healthy levels, which is usually   BMI 19-25. Avoid trans fats and processed foods;  Increase fresh fruits and veges to 5 servings per day. And avoid sweet beverages including tea and juice. Mediterranean diet with olive oil and nuts have been noted to be heart and brain healthy . Avoid tobacco products . Limit  alcohol to  7 per week for women and 14 servings for men.  Get adequate sleep . Wear seat belts . Don't text and drive .   Will notify you  of labs when available. To decide on cholesterol.   management . Foot dermatitis  Can try  Steroid topical for 2 weeks and if not better then.        Why follow it? Research shows. . Those who follow the Mediterranean diet have a reduced risk of heart disease  . The diet is associated with a reduced incidence of Parkinson's and Alzheimer's diseases . People following the diet may have longer life expectancies and lower rates of chronic diseases  . The Dietary Guidelines for Americans recommends the Mediterranean diet as an eating plan to promote health and prevent disease  What Is the Mediterranean Diet?  . Healthy eating plan based on typical foods and recipes of Mediterranean-style cooking . The diet is primarily a plant based diet; these foods should make up a majority of meals   Starches - Plant based foods should make up a majority of meals - They are an important sources of vitamins, minerals, energy, antioxidants, and fiber - Choose whole grains, foods high in fiber and minimally processed items  - Typical grain sources include wheat, oats, barley, corn, brown rice, bulgar, farro, millet, polenta, couscous  - Various types of beans include chickpeas, lentils, fava beans, black beans, white beans   Fruits  Veggies - Large quantities of  antioxidant rich fruits & veggies; 6 or more servings  - Vegetables can be eaten raw or lightly drizzled with oil and cooked  - Vegetables common to the traditional Mediterranean Diet include: artichokes, arugula, beets, broccoli, brussel sprouts, cabbage, carrots, celery, collard greens, cucumbers, eggplant, kale, leeks, lemons, lettuce, mushrooms, okra, onions, peas, peppers, potatoes, pumpkin, radishes, rutabaga, shallots, spinach, sweet potatoes, turnips, zucchini - Fruits common to the Mediterranean Diet include: apples, apricots, avocados, cherries, clementines, dates, figs, grapefruits, grapes, melons, nectarines, oranges, peaches, pears, pomegranates, strawberries, tangerines  Fats - Replace butter and margarine with healthy oils, such as olive oil, canola oil, and tahini  - Limit nuts to no more than a handful a day  - Nuts include walnuts, almonds, pecans, pistachios, pine nuts  - Limit or avoid candied, honey roasted or heavily salted nuts - Olives are central to the [employer] - can be eaten whole or used in a variety of dishes   Meats Protein - Limiting red meat: no more than a few times a month - When eating red meat: choose lean cuts and keep the portion to the size of deck of cards - Eggs: approx. 0 to 4 times a week  - Fish and lean poultry: at least 2 a week  - Healthy protein sources include, chicken, [place], lean beef, lamb - Increase intake of seafood such as tuna, salmon, trout, mackerel, shrimp, scallops - Avoid or limit high fat processed meats such as sausage  and bacon  Dairy - Include moderate amounts of low fat dairy products  - Focus on healthy dairy such as fat free yogurt, skim milk, low or reduced fat cheese - Limit dairy products higher in fat such as whole or 2% milk, cheese, ice cream  Alcohol - Moderate amounts of red wine is ok  - No more than 5 oz daily for women (all ages) and men older than age 30  - No more than 10 oz of wine daily for men younger  than 19  Other - Limit sweets and other desserts  - Use herbs and spices instead of salt to flavor foods  - Herbs and spices common to the traditional Mediterranean Diet include: basil, bay leaves, chives, cloves, cumin, fennel, garlic, lavender, marjoram, mint, oregano, parsley, pepper, rosemary, sage, savory, sumac, tarragon, thyme   It's not just a diet, it's a lifestyle:  . The Mediterranean diet includes lifestyle factors typical of those in the region  . Foods, drinks and meals are best eaten with others and savored . Daily physical activity is important for overall good health . This could be strenuous exercise like running and aerobics . This could also be more leisurely activities such as walking, housework, yard-work, or taking the stairs . Moderation is the key; a balanced and healthy diet accommodates most foods and drinks . Consider portion sizes and frequency of consumption of certain foods   Meal Ideas & Options:  . Breakfast:  o Whole wheat toast or whole wheat English muffins with peanut butter & hard boiled egg o Steel cut oats topped with apples & cinnamon and skim milk  o Fresh fruit: banana, strawberries, melon, berries, peaches  o Smoothies: strawberries, bananas, greek yogurt, peanut butter o Low fat greek yogurt with blueberries and granola  o Egg white omelet with spinach and mushrooms o Breakfast couscous: whole wheat couscous, apricots, skim milk, cranberries  . Sandwiches:  o Hummus and grilled vegetables (peppers, zucchini, squash) on whole wheat bread   o Grilled chicken on whole wheat pita with lettuce, tomatoes, cucumbers or tzatziki  o Tuna salad on whole wheat bread: tuna salad made with greek yogurt, olives, red peppers, capers, green onions o Garlic rosemary lamb pita: lamb sauted with garlic, rosemary, salt & pepper; add lettuce, cucumber, greek yogurt to pita - flavor with lemon juice and black pepper  . Seafood:  o Mediterranean grilled salmon,  seasoned with garlic, basil, parsley, lemon juice and black pepper o Shrimp, lemon, and spinach whole-grain pasta salad made with low fat greek yogurt  o Seared scallops with lemon orzo  o Seared tuna steaks seasoned salt, pepper, coriander topped with tomato mixture of olives, tomatoes, olive oil, minced garlic, parsley, green onions and cappers  . Meats:  o Herbed greek chicken salad with kalamata olives, cucumber, feta  o Red bell peppers stuffed with spinach, bulgur, lean ground beef (or lentils) & topped with feta   o Kebabs: skewers of chicken, tomatoes, onions, zucchini, squash  o [place] burgers: made with red onions, mint, dill, lemon juice, feta cheese topped with roasted red peppers . Vegetarian o Cucumber salad: cucumbers, artichoke hearts, celery, red onion, feta cheese, tossed in olive oil & lemon juice  o Hummus and whole grain pita points with a greek salad (lettuce, tomato, feta, olives, cucumbers, red onion) o Lentil soup with celery, carrots made with vegetable broth, garlic, salt and pepper  o Tabouli salad: parsley, bulgur, mint, scallions, cucumbers, tomato, radishes, lemon juice, olive  oil, salt and pepper.

## 2014-05-24 NOTE — Progress Notes (Signed)
Pre visit review using our clinic review tool, if applicable. No additional management support is needed unless otherwise documented below in the visit note.  Chief Complaint  Patient presents with  . Annual Exam    HPI: Patient  Samantha Hinton  59 y.o. comes in today for Preventive Health Care visit  And Chronic disease management   Ht copd recenet dc tobacco  No recenet   Rescue   Since off  Tobacco.   bp seems ok   Stopped lovastain felt had se of this  Off for 3 weeks nausea    Has problem with sever peeling feet  No pain  Tried lotrimon  Azole otc and no help doesn't think atheletes feet in fact may have make worse  Onset months ago  Health Maintenance  Topic Date Due  . HIV Screening  11/19/1970  . PAP SMEAR  02/20/2007  . COLONOSCOPY  02/20/2011  . INFLUENZA VACCINE  09/20/2014  . MAMMOGRAM  07/02/2015  . TETANUS/TDAP  05/23/2024   Health Maintenance Review LIFESTYLE:  Exercise:  To start   End of moonth  Eating clean and drops  Tobacco/ETS: still stopped  Alcohol:  One -2 per day  Sugar beverages: ocass  Sleep:  About 6-8  Drug use: no  Colonoscopy:  To do in may had endoscopy to get  Emptying scan.  PAP: hysterctomy non cancer  Endometriosis  MAMMO: has cyst  To fu    Hearing: ok   Vision:  No limitations at present . Last eye check UTD  Safety:  Has smoke detector and wears seat belts.  No firearms. No excess sun exposure. Sees dentist regularly.  Falls: no  Advance directive :  Reviewed    Memory: Felt to be good  ,  Depression: No anhedonia unusual crying or depressive symptoms  Nutrition: Eats well balanced diet; adequate calcium and vitamin D. No swallowing chewing problems.  Injury: no major injuries in the last six months.  Other healthcare providers:  Reviewed today .  Social:  hh of 2 . No pets.   Preventive parameters: up-to-date  Reviewed   ADLS:   There are no problems or need for assistance  driving, feeding, obtaining food,  dressing, toileting and bathing, managing money using phone. She is independent.  EXERCISE/ HABITS  Per week   No tobacco    etohdec  One to 2 per ROS:  GEN/ HEENT: No fever, significant weight changes sweats headaches vision problems hearing changes, CV/ PULM; No chest pain shortness of breath cough, syncope,edema  change in exercise tolerance. GI /GU: No adominal pain,  Se gi evalu  Dr Deatra Ina SKIN/HEME: ,no suspicious lesions or bleeding. No lymphadenopathy, nodules, masses.  See above peeling feet  NEURO/ PSYCH:  No  newneurologic signs such as weakness numbness. No depression anxiety. IMM/ Allergy: No unusual infections.  Allergy .   REST of 12 system review negative except as per HPI   Past Medical History  Diagnosis Date  . COPD (chronic obstructive pulmonary disease)   . Hypertension   . Asthma   . Emphysema   . Hx of varicella   . Heartburn   . Genital warts   . High cholesterol   . Colon polyps   . 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   Marland Kitchen Hx: UTI (urinary tract infection)   . Pain management     hx of same in [place]  multiple controlled substances that she weaned off 4 years ago cand not  any worse takes advil as bneeded   . Smoker 02/17/2012    Limits of effective care discussed 08/01/12 / pulmonary clinic/ [facility] Stopped 12 15 withhospitalization   . SIRS (systemic inflammatory response syndrome) 02/11/2014  . Emphysema of lung     Past Surgical History  Procedure Laterality Date  . Abdominal hysterectomy  1983     for fibroids  sitll has one ovary  age 40   . Cholecystectomy  1983  . Neck surgery  [phone]    c spine ant and post hx fusion and removal or harcdware and shavings  . Back surgery    . Appendectomy  1967  . Breast surgery      diseased milk glands  . Tonsillectomy      Family History  Problem Relation Age of Onset  . CAD Father     tumor between heart and lung   . CAD Sister     ? dx   . Hypertension Father   . Hypertension Mother   . Lung cancer  Mother     died 38   . Diabetes Maternal Grandmother     late 1s     History   Social History  . Marital Status: Widowed    Spouse Name: N/A  . Number of Children: 2  . Years of Education: N/A   Occupational History  . retired    Social History Main Topics  . Smoking status: Former Smoker -- 0.25 packs/day for 44 years    Types: Cigarettes    Quit date: 02/12/2014  . Smokeless tobacco: Never Used  . Alcohol Use: 2.4 oz/week    4 Standard drinks or equivalent per week  . Drug Use: No  . Sexual Activity: Not on file   Other Topics Concern  . None   Social History Narrative   6-8 hours of sleep per night   Lives with her fiance hh of 2 no pets    1 dog in the home   Retired no ets  But tob 8 per day   etoh ocass 1-2    On disability from her neck surgery predicaments .   orig from [employer] in [place] area.    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 albuterol (PROAIR HFA) 108 (90 BASE) MCG/ACT inhaler Inhale 2 puffs into the lungs every 4 (four) hours as needed for wheezing or shortness of breath. 2 puffs every 4 hours as needed only  if your can't catch your breath  . albuterol (PROVENTIL) (2.5 MG/3ML) 0.083% nebulizer solution Take 3 mLs (2.5 mg total) by nebulization every 6 (six) hours as needed for wheezing.  . bisoprolol-hydrochlorothiazide (ZIAC) 5-6.25 MG per tablet TAKE ONE TABLET BY MOUTH ONCE DAILY  . budesonide-formoterol (SYMBICORT) 160-4.5 MCG/ACT inhaler Take 2 puffs first thing in am and then another 2 puffs about 12 hours later.  . cetirizine (ZYRTEC) 10 MG tablet Take 10 mg by mouth daily.  Marland Kitchen CINNAMON PO Take 1 tablet by mouth daily.   Marland Kitchen ipratropium (ATROVENT) 0.02 % nebulizer solution Take 2.5 mLs (500 mcg total) by nebulization 4 (four) times daily as needed for wheezing.  . Multiple  Vitamins-Minerals (CENTRUM PO) Take 1 tablet by mouth daily.   Marland Kitchen nystatin (MYCOSTATIN) 100000 UNIT/ML suspension Take 5  mLs (500,000 Units total) by mouth 4 (four) times daily.  Marland Kitchen omeprazole (PRILOSEC) 20 MG capsule Take 1 capsule (20 mg total) by mouth daily.  . fluocinonide-emollient (LIDEX-E) 0.05 % cream Apply 1 application topically 2 (two) times daily. To both feet as directed  . lovastatin (MEVACOR) 40 MG tablet Take 1 tablet (40 mg total) by mouth at bedtime. (Patient not taking: Reported on 05/12/2014)  . Na Sulfate-K Sulfate-Mg Sulf (SUPREP BOWEL PREP) SOLN Take 1 kit by mouth once. (Patient not taking: Reported on 05/12/2014)  . promethazine (PHENERGAN) 12.5 MG tablet Take 1 tablet (12.5 mg total) by mouth every 8 (eight) hours as needed for nausea or vomiting. (Patient not taking: Reported on 05/12/2014)  . [DISCONTINUED] benzonatate (TESSALON) 200 MG capsule Take 1 capsule (200 mg total) by mouth 3 (three) times daily as needed for cough. (Patient not taking: Reported on 05/12/2014)    EXAM:  BP 136/82 mmHg  Temp(Src) 98.3 F (36.8 C) (Oral)  Ht 5' 4.5" (1.638 m)  Wt 178 lb 8 oz (80.967 kg)  BMI 30.18 kg/m2  Body mass index is 30.18 kg/(m^2).  Physical Exam: Vital signs reviewed ELF:YBOF is a well-developed well-nourished alert cooperative    who appearsr stated age in no acute distress.  HEENT: normocephalic atraumatic , Eyes: PERRL EOM's full, conjunctiva clear, Nares: paten,t no deformity discharge or tenderness., Ears: no deformity EAC's clear TMs with normal landmarks. Mouth: clear OP, no lesions, edema.  Moist mucous membranes. Dentition in adequate repair. NECK: supple without masses, thyromegaly or bruits. CHEST/PULM:  Clear to auscultation and percussion breath sounds equal no wheeze , rales or rhonchi.  Decreased breath sounds  No chest wall deformities or tenderness. Breast: normal by inspection . No dimpling, discharge, masses, tenderness or discharge . CV: PMI is  nondisplaced, S1 S2 no gallops, murmurs, rubs. Peripheral pulses are full without delay.No JVD .  ABDOMEN: Bowel sounds normal nontender  No guard or rebound, no hepato splenomegal no CVA tenderness.  No hernia. Noted  Extremtities:  No clubbing cyanosis or edema, no acute joint swelling or redness no focal atrophy NEURO:  Oriented x3, cranial nerves 3-12 appear to be intact, no obvious focal weakness,gait within normal limits no abnormal reflexes or asymmetrical SKIN:  Peeling feet  Soles but interdigital area normal  Otherwise normal turgor, color, no acute bruising or petechiae. PSYCH: Oriented, good eye contact, no obvious depression anxiety, cognition and judgment appear normal. LN: no cervical axillary inguinal adenopathy   BP Readings from Last 3 Encounters:  05/24/14 136/82  05/12/14 140/72  05/07/14 120/78   \ Wt Readings from Last 3 Encounters:  05/24/14 178 lb 8 oz (80.967 kg)  05/12/14 188 lb (85.276 kg)  05/07/14 188 lb 6.4 oz (85.458 kg)   Labs from [place] reviewed  ASSESSMENT AND PLAN:  Discussed the following assessment and plan:  Visit for preventive health examination - Plan: HIV antibody  Hyperlipidemia - Plan: Hepatic function panel, Lipid panel, TSH, Basic metabolic panel, CBC with Differential/Platelet  COPD GOLD II - Plan: Hepatic function panel, Lipid panel, TSH, Basic metabolic panel, CBC with Differential/Platelet  Essential hypertension, benign - Plan: Hepatic function panel, Lipid panel, TSH, Basic metabolic panel, CBC with Differential/Platelet  Foot dermatitis - topical steroid trial - Plan: Hepatic function panel, Lipid panel, TSH, Basic metabolic panel, CBC with Differential/Platelet  Need for Tdap vaccination - Plan: Tdap vaccine greater than or equal to 7yo IM  Encounter for screening for HIV - Plan: HIV antibody  Medication  side effect, initial encounter - lovastatin caused nausea  so stopped check lipids today and then decide on plan    Medication management  Smoker now stopped  - continue tobacco free  Patient Care Team: Burnis Medin, MD as PCP - General (Internal Medicine) Tanda Rockers, MD as Consulting Physician (Pulmonary Disease) Inda Castle, MD as Consulting Physician (Gastroenterology) Patient Instructions   congrats on tobacco free  Continue lifestyle intervention healthy eating and exercise . Yearly flu  vaccine  Healthy lifestyle includes : At least 150 minutes of exercise weeks  , weight at healthy levels, which is usually   BMI 19-25. Avoid trans fats and processed foods;  Increase fresh fruits and veges to 5 servings per day. And avoid sweet beverages including tea and juice. Mediterranean diet with olive oil and nuts have been noted to be heart and brain healthy . Avoid tobacco products . Limit  alcohol to  7 per week for women and 14 servings for men.  Get adequate sleep . Wear seat belts . Don't text and drive .   Will notify you  of labs when available. To decide on cholesterol.   management . Foot dermatitis  Can try  Steroid topical for 2 weeks and if not better then.        Why follow it? Research shows. . Those who follow the Mediterranean diet have a reduced risk of heart disease  . The diet is associated with a reduced incidence of Parkinson's and Alzheimer's diseases . People following the diet may have longer life expectancies and lower rates of chronic diseases  . The Dietary Guidelines for Americans recommends the Mediterranean diet as an eating plan to promote health and prevent disease  What Is the Mediterranean Diet?  . Healthy eating plan based on typical foods and recipes of Mediterranean-style cooking . The diet is primarily a plant based diet; these foods should make up a majority of meals   Starches - Plant based foods should make up a majority of meals - They are an important sources of vitamins, minerals, energy, antioxidants, and fiber - Choose whole grains,  foods high in fiber and minimally processed items  - Typical grain sources include wheat, oats, barley, corn, brown rice, bulgar, farro, millet, polenta, couscous  - Various types of beans include chickpeas, lentils, fava beans, black beans, white beans   Fruits  Veggies - Large quantities of antioxidant rich fruits & veggies; 6 or more servings  - Vegetables can be eaten raw or lightly drizzled with oil and cooked  - Vegetables common to the traditional Mediterranean Diet include: artichokes, arugula, beets, broccoli, brussel sprouts, cabbage, carrots, celery, collard greens, cucumbers, eggplant, kale, leeks, lemons, lettuce, mushrooms, okra, onions, peas, peppers, potatoes, pumpkin, radishes, rutabaga, shallots, spinach, sweet potatoes, turnips, zucchini - Fruits common to the Mediterranean Diet include: apples, apricots, avocados, cherries, clementines, dates, figs, grapefruits, grapes, melons, nectarines, oranges, peaches, pears, pomegranates, strawberries, tangerines  Fats - Replace butter and margarine with healthy oils, such as olive oil, canola oil, and tahini  - Limit nuts to no more than a handful a day  - Nuts include walnuts, almonds, pecans, pistachios, pine nuts  - Limit or avoid candied, honey roasted or heavily salted nuts - Olives are central to the Mediterranean diet - can be eaten whole or used in a variety of dishes   Meats Protein - Limiting red meat: no more than a few times a month - When eating red meat: choose  lean cuts and keep the portion to the size of deck of cards - Eggs: approx. 0 to 4 times a week  - Fish and lean poultry: at least 2 a week  - Healthy protein sources include, chicken, [place], lean beef, lamb - Increase intake of seafood such as tuna, salmon, trout, mackerel, shrimp, scallops - Avoid or limit high fat processed meats such as sausage and bacon  Dairy - Include moderate amounts of low fat dairy products  - Focus on healthy dairy such as fat free  yogurt, skim milk, low or reduced fat cheese - Limit dairy products higher in fat such as whole or 2% milk, cheese, ice cream  Alcohol - Moderate amounts of red wine is ok  - No more than 5 oz daily for women (all ages) and men older than age 5  - No more than 10 oz of wine daily for men younger than 69  Other - Limit sweets and other desserts  - Use herbs and spices instead of salt to flavor foods  - Herbs and spices common to the traditional Mediterranean Diet include: basil, bay leaves, chives, cloves, cumin, fennel, garlic, lavender, marjoram, mint, oregano, parsley, pepper, rosemary, sage, savory, sumac, tarragon, thyme   It's not just a diet, it's a lifestyle:  . The Mediterranean diet includes lifestyle factors typical of those in the region  . Foods, drinks and meals are best eaten with others and savored . Daily physical activity is important for overall good health . This could be strenuous exercise like running and aerobics . This could also be more leisurely activities such as walking, housework, yard-work, or taking the stairs . Moderation is the key; a balanced and healthy diet accommodates most foods and drinks . Consider portion sizes and frequency of consumption of certain foods   Meal Ideas & Options:  . Breakfast:  o Whole wheat toast or whole wheat English muffins with peanut butter & hard boiled egg o Steel cut oats topped with apples & cinnamon and skim milk  o Fresh fruit: banana, strawberries, melon, berries, peaches  o Smoothies: strawberries, bananas, greek yogurt, peanut butter o Low fat greek yogurt with blueberries and granola  o Egg white omelet with spinach and mushrooms o Breakfast couscous: whole wheat couscous, apricots, skim milk, cranberries  . Sandwiches:  o Hummus and grilled vegetables (peppers, zucchini, squash) on whole wheat bread   o Grilled chicken on whole wheat pita with lettuce, tomatoes, cucumbers or tzatziki  o Tuna salad on whole wheat  bread: tuna salad made with greek yogurt, olives, red peppers, capers, green onions o Garlic rosemary lamb pita: lamb sauted with garlic, rosemary, salt & pepper; add lettuce, cucumber, greek yogurt to pita - flavor with lemon juice and black pepper  . Seafood:  o Mediterranean grilled salmon, seasoned with garlic, basil, parsley, lemon juice and black pepper o Shrimp, lemon, and spinach whole-grain pasta salad made with low fat greek yogurt  o Seared scallops with lemon orzo  o Seared tuna steaks seasoned salt, pepper, coriander topped with tomato mixture of olives, tomatoes, olive oil, minced garlic, parsley, green onions and cappers  . Meats:  o Herbed greek chicken salad with kalamata olives, cucumber, feta  o Red bell peppers stuffed with spinach, bulgur, lean ground beef (or lentils) & topped with feta   o Kebabs: skewers of chicken, tomatoes, onions, zucchini, squash  o [place] burgers: made with red onions, mint, dill, lemon juice, feta cheese topped with roasted red  peppers . Vegetarian o Cucumber salad: cucumbers, artichoke hearts, celery, red onion, feta cheese, tossed in olive oil & lemon juice  o Hummus and whole grain pita points with a greek salad (lettuce, tomato, feta, olives, cucumbers, red onion) o Lentil soup with celery, carrots made with vegetable broth, garlic, salt and pepper  o Tabouli salad: parsley, bulgur, mint, scallions, cucumbers, tomato, radishes, lemon juice, olive oil, salt and pepper.         Standley Brooking. Vita Currin M.D.

## 2014-05-25 LAB — HEPATIC FUNCTION PANEL
ALK PHOS: 57 U/L (ref 39–117)
ALT: 22 U/L (ref 0–35)
AST: 22 U/L (ref 0–37)
Albumin: 4.3 g/dL (ref 3.5–5.2)
BILIRUBIN DIRECT: 0.2 mg/dL (ref 0.0–0.3)
BILIRUBIN TOTAL: 0.6 mg/dL (ref 0.2–1.2)
Total Protein: 7.4 g/dL (ref 6.0–8.3)

## 2014-05-25 LAB — CBC WITH DIFFERENTIAL/PLATELET
BASOS ABS: 0 10*3/uL (ref 0.0–0.1)
Basophils Relative: 0.3 % (ref 0.0–3.0)
EOS PCT: 1.2 % (ref 0.0–5.0)
Eosinophils Absolute: 0.1 10*3/uL (ref 0.0–0.7)
HEMATOCRIT: 42.4 % (ref 36.0–46.0)
Hemoglobin: 14.2 g/dL (ref 12.0–15.0)
LYMPHS ABS: 2.2 10*3/uL (ref 0.7–4.0)
Lymphocytes Relative: 29.9 % (ref 12.0–46.0)
MCHC: 33.5 g/dL (ref 30.0–36.0)
MCV: 87.9 fl (ref 78.0–100.0)
Monocytes Absolute: 0.5 10*3/uL (ref 0.1–1.0)
Monocytes Relative: 7.4 % (ref 3.0–12.0)
NEUTROS ABS: 4.5 10*3/uL (ref 1.4–7.7)
Neutrophils Relative %: 61.2 % (ref 43.0–77.0)
Platelets: 227 10*3/uL (ref 150.0–400.0)
RBC: 4.82 Mil/uL (ref 3.87–5.11)
RDW: 13.6 % (ref 11.5–15.5)
WBC: 7.4 10*3/uL (ref 4.0–10.5)

## 2014-05-25 LAB — BASIC METABOLIC PANEL
BUN: 11 mg/dL (ref 6–23)
CO2: 30 mEq/L (ref 19–32)
Calcium: 10.1 mg/dL (ref 8.4–10.5)
Chloride: 100 mEq/L (ref 96–112)
Creatinine, Ser: 0.93 mg/dL (ref 0.40–1.20)
GFR: 65.69 mL/min (ref >60.00)
Glucose, Bld: 74 mg/dL (ref 70–99)
Potassium: 4.4 mEq/L (ref 3.5–5.1)
Sodium: 143 mEq/L (ref 135–145)

## 2014-05-25 LAB — LIPID PANEL
CHOL/HDL RATIO: 5
CHOLESTEROL: 185 mg/dL (ref 0–200)
HDL: 38.4 mg/dL — ABNORMAL LOW (ref >39.00)
LDL Cholesterol: 116 mg/dL — ABNORMAL HIGH (ref 0–99)
NONHDL: 146.6
Triglycerides: 154 mg/dL — ABNORMAL HIGH (ref 0.0–149.0)
VLDL: 30.8 mg/dL (ref 0.0–40.0)

## 2014-05-25 LAB — TSH: TSH: 0.76 u[IU]/mL (ref 0.35–4.50)

## 2014-05-26 LAB — HIV ANTIBODY (ROUTINE TESTING W REFLEX): HIV: NONREACTIVE

## 2014-06-02 ENCOUNTER — Ambulatory Visit (HOSPITAL_COMMUNITY): Payer: Medicare HMO

## 2014-06-24 ENCOUNTER — Encounter: Payer: Medicare HMO | Admitting: Gastroenterology

## 2014-07-27 ENCOUNTER — Telehealth: Payer: Self-pay | Admitting: Gastroenterology

## 2014-07-27 NOTE — Telephone Encounter (Signed)
Procedure cancelled.

## 2014-08-02 ENCOUNTER — Ambulatory Visit (HOSPITAL_COMMUNITY): Payer: Medicare HMO

## 2014-08-02 ENCOUNTER — Other Ambulatory Visit: Payer: Self-pay | Admitting: Internal Medicine

## 2014-08-03 NOTE — Telephone Encounter (Signed)
Sent to the pharmacy by e-scribe.  Pt has follow up in Sept 2016

## 2014-08-05 ENCOUNTER — Telehealth: Payer: Self-pay | Admitting: Internal Medicine

## 2014-08-05 DIAGNOSIS — J449 Chronic obstructive pulmonary disease, unspecified: Secondary | ICD-10-CM

## 2014-08-05 MED ORDER — ALBUTEROL SULFATE HFA 108 (90 BASE) MCG/ACT IN AERS: 2.0000 | INHALATION_SPRAY | RESPIRATORY_TRACT | Status: DC | PRN

## 2014-08-05 MED ORDER — BUDESONIDE-FORMOTEROL FUMARATE 160-4.5 MCG/ACT IN AERO: INHALATION_SPRAY | RESPIRATORY_TRACT | Status: DC

## 2014-08-05 NOTE — Telephone Encounter (Signed)
Spoke with pt, requesting samples of proair and symbicort to last until Tuesday appt with MW.  I advised that we do not have samples of either, but I can call in 1 inhaler each for her in the meantime.  She is ok with this and it has been sent.  I advised that she must keep rov for future refills.  She is aware.  Nothing further needed .

## 2014-08-10 ENCOUNTER — Ambulatory Visit: Payer: Medicare HMO | Admitting: Internal Medicine

## 2014-08-12 ENCOUNTER — Encounter: Payer: Medicare HMO | Admitting: Gastroenterology

## 2014-08-13 ENCOUNTER — Encounter: Payer: Self-pay | Admitting: Internal Medicine

## 2014-08-13 ENCOUNTER — Ambulatory Visit (INDEPENDENT_AMBULATORY_CARE_PROVIDER_SITE_OTHER): Payer: Medicare HMO | Admitting: Internal Medicine

## 2014-08-13 VITALS — BP 132/80 | HR 83 | Ht 66.0 in | Wt 182.6 lb

## 2014-08-13 DIAGNOSIS — J449 Chronic obstructive pulmonary disease, unspecified: Secondary | ICD-10-CM | POA: Diagnosis not present

## 2014-08-13 NOTE — Assessment & Plan Note (Addendum)
-   PFT's 06/12/2012 FEV1  1.41 (60%) ratio 64 and no better p B2  And DLCO 51 corrects to 94 % - 06/12/2012 refer to Rehab - 08/02/2012 started tudorza > improved - 06/19/2013  >  Started symbicort 2 bid   The proper method of use, as well as anticipated side effects, of a metered-dose inhaler are discussed and demonstrated to the patient. Improved effectiveness after extensive coaching during this visit to a level of approximately      90%   I had an extended final summary discussion with the patient reviewing all relevant studies completed to date and  lasting 15 to 20 minutes of a 25 minute visit on the following issues:    Now that quit smoking doing much better and may not symbicort  Ok to use albuterol as needed     I reviewed the Fletcher curve with the patient that basically indicates  if you quit smoking when your best day FEV1 is still well preserved (as is clearly  the case here)  it is highly unlikely you will progress to severe disease and informed the patient there was no medication on the market that has proven to alter the curve/ its downward trajectory  or the likelihood of progression of their disease.  Therefore stopping smoking and maintaining abstinence is the most important aspect of care, not choice of inhalers or for that matter, doctors.    Each maintenance medication was reviewed in detail including most importantly the difference between maintenance and as needed and under what circumstances the prns are to be used.  Please see instructions for details which were reviewed in writing and the patient given a copy.    Pulmonary f/u is prn

## 2014-08-13 NOTE — Patient Instructions (Signed)
Ok to change symbicort to take  as needed Take 2 puffs first thing in am and then another 2 puffs about 12 hours later.   Only use your albuterol as a rescue medication to be used if you can't catch your breath by resting or doing a relaxed purse lip breathing pattern.  - The less you use it, the better it will work when you need it. - Ok to use up to 2 puffs  every 4 hours if you must but call for immediate appointment if use goes up over your usual need after you first restart symbicort - Don't leave home without it !!  (think of it like the spare tire for your car)    If you are satisfied with your treatment plan,  let your doctor know and he/she can either refill your medications or you can return here when your prescription runs out.     If in any way you are not 100% satisfied,  please tell us.  If 100% better, tell your friends!  Pulmonary follow up is as needed

## 2014-08-13 NOTE — Progress Notes (Signed)
Subjective:    Patient ID: Samantha Hinton, female    DOB: December 05, 1955 MRN: [id number]    Brief patient profile:  28 yowf smoker with GOLD II COPD  baseline doe x best days able to do extensive shopping s 02 better with albuterol when had access to it then admitted [facility]:   Admit Date: 02/17/2012  PRIMARY DISCHARGE DIAGNOSIS:  URI (upper respiratory infection)  COPD exacerbation  SOB (shortness of breath)  Hypoxemia  UTI (lower urinary tract infection)  Diarrhea  Tobacco abuse    04/10/2012 1st pulmonary ov  On ACEI cc doe x 5 steps on neb and albuterol 7 x daily with persistent sense of chest tightness no better since discharge rec Stop lisinopril and start bystolic 5 mg one daily Start dulera 100 Take 2 puffs first thing in am and then another 2 puffs about 12 hours later.  Plan B  Only use your albuterol inhaler, plan C is your nebulizer) as a rescue medication   GERD diet   05/08/2012 f/u ov/Samantha Hinton cc breathing much better on dulera 465 and bystolic no need for saba or neb despite head and chest cold since last ov. rec Start bisoprolol 5 mg daily in place of bystolic - break in half daily if too strong dulera 200 Take 2 puffs first thing in am and then another 2 puffs about 12 hours later.  06/12/2012 f/u ov/Samantha Hinton re copd Chief Complaint  Patient presents with  . Follow-up    Breathing some worse for the past 2 wks, relates to allergy season. Cough has improve some.    takes zyrtec year round, very nervous using saba 1-2 x daily but typically not at rest or hs.  Minimal impact on activity tolerance though. rec Zyrtec should be used one daily as needed for itching sneezing runny nose Blow out through through the nose Please see patient coordinator before you leave today  to schedule pulmonary rehab > placed on wt list  Prednisone 10 mg take  4 each am x 2 days,   2 each am x 2 days,  1 each am x2days and stop   08/01/2012 f/u ov/Samantha Hinton still smoking Chief Complaint  Patient  presents with  . Follow-up    Pt states that her breathing is unchanged since last visit. Cough is some better.   still using hfa albuterol before  And after dulera but  not needing nebulizer alb and not limited from desired activities - still on wt list for rehab. Not convinced dulera really helping her or reducing perceived need for saba and no real change since ran out x > 2 weeks prior to OV   rec tudorza one twice daily on a trial basis Only use your albuterol (ventolin 1st, nebulizer is second)    09/23/2012 f/u ov/Samantha Hinton still smoking Chief Complaint  Patient presents with  . 4 wk follow up    Has ran out of [place].  Breathing improved while on this medicaiton but has noticed more wheezing now that she is off of it.    on turdorza needed saba albuterol less than a couple times a week and no neb rec Resume tudorza one twice daily  Only use your albuterol as a rescue medication to be used if you can't catch your breath by resting or doing a relaxed purse lip breathing pattern. The less you use it, the better it will work when you need it.  The key is to stop smoking completely before smoking completely stops you!  Prednisone 10 mg take  4 each am x 2 days,   2 each am x 2 days,  1 each am x 2 days and stop  For any flare of resp symptoms add ranitidine twice daily esp the bedtime dose     06/19/2013 f/u ov/Samantha Hinton re: GOLD II/ still smoking freq copd exac,  responds to prednisone very well  Chief Complaint  Patient presents with  . Acute Visit    Pt c/o increased cough, SOB and wheezing for the past 5 months. Cough is occ prod with minimal brown sputum.  She has used neb with albuterol and atrovent twice in the past wk.   advair added 06/16/13 / no lama  Has neb with sep alb/atrovent, no hfa alb/still smoking First thing in am and then another 2 puffs about 12 hours later.  Only use your albuterol/atrovent neb  as a rescue  Stop advair The key is to stop smoking completely before  smoking completely stops you!  For any flare of resp symptoms add ranitidine twice daily esp the bedtime dose  GERD  Diet    07/01/2013 f/u ov/Samantha Hinton re:  GOLD II still smoking  Chief Complaint  Patient presents with  . Follow-up    Cough has pretty much resolved. Breathing has returned to her normal baseline with no need for neb.   Not limited by breathing from desired activities   rec No change   08/13/2014 f/u ov/Samantha Hinton re: GOLD II copd/ quit smoking Dec 2015 / not using symb regularly  Chief Complaint  Patient presents with  . Follow-up    Pt here for medication refill. She states her breathing hsa been doing well. She uses albuterol inhaler 1 x daily on average. She rarely uses nebs.   no real change off symbicort up to a month at a time  Only sob overdoes/ sleeps fine    No obvious daytime variabilty or assoc excess or purulent sputum or cp or chest tightness, subjective wheeze overt sinus or hb symptoms. No unusual exp hx or h/o childhood pna/ asthma or premature birth to her knowledge.   Sleeping ok without nocturnal  or early am exacerbation  of respiratory  c/o's or need for noct saba. Also denies any obvious fluctuation of symptoms with weather or environmental changes or other aggravating or alleviating factors except as outlined above   Current Medications, Allergies, Past Medical History, Past Surgical History, Family History, and Social History were reviewed in [employer] record.  ROS  The following are not active complaints unless bolded sore throat, dysphagia, dental problems, itching, sneezing,  nasal congestion or excess/ purulent secretions, ear ache,   fever, chills, sweats, unintended wt loss, pleuritic or exertional cp, hemoptysis,  orthopnea pnd or leg swelling, presyncope, palpitations, heartburn, abdominal pain, anorexia, nausea, vomiting, diarrhea  or change in bowel or urinary habits, change in stools or urine, dysuria,hematuria,  rash,  arthralgias, visual complaints, headache, numbness weakness or ataxia or problems with walking or coordination,  change in mood/affect or memory.               Objective:   Physical Exam  05/08/2012  Wt 195  > 199 06/12/2012 > 201 08/01/2012 > 09/23/2012  204 > 06/19/2013  199 > 07/01/2013  195 > 08/13/2014 183  Wt Readings from Last 3 Encounters:  04/10/12 194 lb 9.6 oz (88.27 kg)  02/20/12 190 lb 12.8 oz (86.546 kg)    Vital signs reviewed  amb pleasant wf nad/  only wheeze with fvc and mostly upper airway/ resolves with purse lip  HEENT mild turbinate edema.  Oropharynx no thrush or excess pnd or cobblestoning.  No JVD or cervical adenopathy. Mild accessory muscle hypertrophy. Trachea midline, nl thryroid. Chest was hyperinflated by percussion with diminished breath sounds and moderate increased exp time without wheeze. Hoover sign positive at mid inspiration. Regular rate and rhythm without murmur gallop or rub or increase P2 or edema.  Abd: no hsm, nl excursion. Ext warm without cyanosis or clubbing.     I personally reviewed images and agree with radiology impression as follows:  pCXR:  02/12/15 No acute changes       Assessment & Plan:

## 2014-10-19 ENCOUNTER — Ambulatory Visit (INDEPENDENT_AMBULATORY_CARE_PROVIDER_SITE_OTHER): Payer: Medicare HMO | Admitting: Internal Medicine

## 2014-10-19 ENCOUNTER — Encounter: Payer: Self-pay | Admitting: Internal Medicine

## 2014-10-19 ENCOUNTER — Telehealth: Payer: Self-pay | Admitting: Internal Medicine

## 2014-10-19 VITALS — BP 160/100 | Temp 98.3°F | Wt 192.2 lb

## 2014-10-19 DIAGNOSIS — M5416 Radiculopathy, lumbar region: Secondary | ICD-10-CM | POA: Diagnosis not present

## 2014-10-19 DIAGNOSIS — R29898 Other symptoms and signs involving the musculoskeletal system: Secondary | ICD-10-CM | POA: Diagnosis not present

## 2014-10-19 DIAGNOSIS — Z23 Encounter for immunization: Secondary | ICD-10-CM

## 2014-10-19 MED ORDER — PREDNISONE 10 MG PO TABS: ORAL_TABLET | ORAL | Status: DC

## 2014-10-19 MED ORDER — HYDROCODONE-ACETAMINOPHEN 5-325 MG PO TABS: 1.0000 | ORAL_TABLET | ORAL | Status: DC | PRN

## 2014-10-19 NOTE — Telephone Encounter (Signed)
Patient Name: Samantha Hinton DOB: 1955-09-25 Initial Comment Caller states she is having severe left leg pain, from buttocks to feet with numbness and burning Nurse Assessment Nurse: Vallery Sa, RN, Cathy Date/Time (Eastern Time): 10/19/2014 9:37:00 AM Confirm and document reason for call. If symptomatic, describe symptoms. ---Caller states she developed low back pain that shoots down in to her left buttock/leg about a month ago. No fever. No known injury. Has the patient traveled out of the country within the last 30 days? ---No Does the patient require triage? ---Yes Related visit to physician within the last 2 weeks? ---No Does the PT have any chronic conditions? (i.e. diabetes, asthma, etc.) ---Yes List chronic conditions. ---COPD, Emphysema, Neck surgery and problems, High Cholesterol, Back and ankle problems in the past Guidelines Guideline Title Affirmed Question Affirmed Notes Back Pain Weakness of a leg or foot (e.g., unable to bear weight, dragging foot) left leg Final Disposition User Go to ED Now (or PCP triage) Vallery Sa, RN, Cathy Comments Scheduled for 11:15am appointment with Dr. Regis Bill today. Referrals REFERRED TO PCP OFFICE Disagree/Comply: Comply

## 2014-10-19 NOTE — Progress Notes (Signed)
Pre visit review using our clinic review tool, if applicable. No additional management support is needed unless otherwise documented below in the visit note.   Chief Complaint  Patient presents with  . Left Side Lower Back Pain    X1Month  . Left Leg Pain  . Left Leg Numbness and Tingling    HPI: Patient Samantha Hinton  comes in today for SDA for  new problem evaluation.  Onset about 1 months ago without injury and getting worse intolerable now  Pain elf t back and buttocks down leg and now continuous pain and numbness down left leg. Tingling   In left lg.   Foot drags   No fallen.  Radiates back of leg to foot    . 24 7 problem    And "excruciating"  Hard to turn in sleep.    Has used ibuprofen heat ice  Rest   No change same with walking.   No bowel or bladder problem  No fever  .  Team health said to seek emergency  care  Bf has told her to be seen last week.   ROS: See pertinent positives and negatives per HPI. No cp sob still stopped tobacco  Never had this before  Remote hx of cervical spine surgery for djd when in [place] .  Past Medical History  Diagnosis Date  . COPD (chronic obstructive pulmonary disease)   . Hypertension   . Asthma   . Emphysema   . Hx of varicella   . Heartburn   . Genital warts   . High cholesterol   . Colon polyps   . 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   Marland Kitchen Hx: UTI (urinary tract infection)   . Pain management     hx of same in [place]  multiple controlled substances that she weaned off 4 years ago cand not any worse takes advil as bneeded   . Smoker 02/17/2012    Limits of effective care discussed 08/01/12 / pulmonary clinic/ [facility] Stopped 12 15 withhospitalization   . SIRS (systemic inflammatory response syndrome) 02/11/2014  . Emphysema of lung     Family History  Problem Relation Age of Onset  . CAD Father     tumor between heart and lung   . CAD Sister     ? dx   . Hypertension Father   . Hypertension Mother   . Lung cancer Mother     died  7   . Diabetes Maternal Grandmother     late 23s     Social History   Social History  . Marital Status: Widowed    Spouse Name: N/A  . Number of Children: 2  . Years of Education: N/A   Occupational History  . retired    Social History Main Topics  . Smoking status: Former Smoker -- 0.25 packs/day for 44 years    Types: Cigarettes    Quit date: 02/12/2014  . Smokeless tobacco: Never Used  . Alcohol Use: 2.4 oz/week    4 Standard drinks or equivalent per week  . Drug Use: No  . Sexual Activity: Not Asked   Other Topics Concern  . None   Social History Narrative   6-8 hours of sleep per night   Lives with her fiance hh of 2 no pets    1 dog in the home   Retired no ets  But tob 8 per day   etoh ocass 1-2    On disability from  her neck surgery predicaments .   orig from [employer] in [place] area.    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 albuterol (PROAIR HFA) 108 (90 BASE) MCG/ACT inhaler Inhale 2 puffs into the lungs every 4 (four) hours as needed for wheezing or shortness of breath. 1 Inhaler 0  . albuterol (PROVENTIL) (2.5 MG/3ML) 0.083% nebulizer solution Take 3 mLs (2.5 mg total) by nebulization every 6 (six) hours as needed for wheezing. 75 mL 1  . bisoprolol-hydrochlorothiazide (ZIAC) 5-6.25 MG per tablet TAKE ONE TABLET BY MOUTH ONCE DAILY 90 tablet 1  . budesonide-formoterol (SYMBICORT) 160-4.5 MCG/ACT inhaler Take 2 puffs first thing in am and then another 2 puffs about 12 hours later. 1 Inhaler 0  . cetirizine (ZYRTEC) 10 MG tablet Take 10 mg by mouth daily.    Marland Kitchen CINNAMON PO Take 1 tablet by mouth daily.     Marland Kitchen ipratropium (ATROVENT) 0.02 % nebulizer solution Take 2.5 mLs (500 mcg total) by nebulization 4 (four) times daily as needed for wheezing. 75 mL 1  . Multiple Vitamins-Minerals (CENTRUM PO)  Take 1 tablet by mouth daily.     Marland Kitchen omeprazole (PRILOSEC) 20 MG capsule Take 1 capsule (20 mg total) by mouth daily. 30 capsule 3   No facility-administered medications prior to visit.     EXAM:  BP 160/100 mmHg  Temp(Src) 98.3 F (36.8 C) (Oral)  Wt 192 lb 3.2 oz (87.181 kg)  Body mass index is 31.04 kg/(m^2).  GENERAL: vitals reviewed and listed above, alert, oriented, appears well hydrated and in  Pain  Distress whe moing walks bent over with a limp  HEENT: atraumatic, conjunctiva  clear, no obvious abnormalities on inspection of external nose and ears ONECK: no obvious masses on inspection palpation  Back left buttock pain and  gait antalgic with favored left foot  Not real foot drop  ? slr hard to  Examine .  decreaset toe elevation   Strength  Vs pain  dtrs dec achilles   Patellar is present  MS: moves all extremities without noticeable focal  abnormality PSYCH: pleasant and cooperative,  In obivous discomfort   ASSESSMENT AND PLAN:  Discussed the following assessment and plan:  Encounter for immunization  Acute left lumbar radiculopathy - for the past 4 weeks   with poss weakness  dec achilles reflex  - Plan: MR Lumbar Spine Wo Contrast, Ambulatory referral to Neurosurgery  Left leg weakness ? - vs pain   dorsifelxor toes   - Plan: MR Lumbar Spine Wo Contrast, Ambulatory referral to Neurosurgery 20 hydrocodone  pain meds given with caution if needed to take only a s needed  Indicated   For the severity of her pain today  Use with caution     steroid 12 day taper     referral as soon as possible in the meantime. -Patient advised to return or notify health care team  if symptoms worsen ,persist or new concerns arise.  Patient Instructions  Plan  MRi of back and Neuro surgery spine  Referral asap .    prednisone  In the interim to see if helps swelling around the nerve.      Standley Brooking. Panosh M.D.

## 2014-10-19 NOTE — Patient Instructions (Signed)
Plan  MRi of back and Neuro surgery spine  Referral asap .    prednisone  In the interim to see if helps swelling around the nerve.

## 2014-10-26 ENCOUNTER — Other Ambulatory Visit: Payer: Self-pay | Admitting: Internal Medicine

## 2014-10-26 ENCOUNTER — Telehealth: Payer: Self-pay | Admitting: Internal Medicine

## 2014-10-26 MED ORDER — HYDROCODONE-ACETAMINOPHEN 5-325 MG PO TABS: 1.0000 | ORAL_TABLET | ORAL | Status: DC | PRN

## 2014-10-26 NOTE — Telephone Encounter (Signed)
Pt calling to request refill of HYDROcodone-acetaminophen (NORCO/VICODIN) 5-325 MG per tablet

## 2014-10-26 NOTE — Telephone Encounter (Signed)
When is her appt for  The mri and ns    Can rx 12 pills   if needed not for long term use.  No refills

## 2014-10-26 NOTE — Telephone Encounter (Signed)
Pt is scheduled for NS on 10/27/14.  No appt for MRI.  Pt will pick up rx for 12 pills at the front desk.

## 2014-11-12 ENCOUNTER — Ambulatory Visit: Payer: Medicare HMO | Admitting: Internal Medicine

## 2014-11-13 ENCOUNTER — Ambulatory Visit
Admission: RE | Admit: 2014-11-13 | Discharge: 2014-11-13 | Disposition: A | Discharge status: Home / Self Care | Payer: Medicare HMO | Source: Ambulatory Visit | Attending: Internal Medicine | Admitting: Internal Medicine

## 2014-11-13 DIAGNOSIS — M5416 Radiculopathy, lumbar region: Secondary | ICD-10-CM

## 2014-11-13 DIAGNOSIS — R29898 Other symptoms and signs involving the musculoskeletal system: Secondary | ICD-10-CM

## 2014-11-16 ENCOUNTER — Telehealth: Payer: Self-pay | Admitting: Internal Medicine

## 2014-11-16 NOTE — Telephone Encounter (Signed)
Please see result note 

## 2014-11-16 NOTE — Telephone Encounter (Signed)
Pt would like a call back about MRI results  °

## 2014-11-16 NOTE — Telephone Encounter (Signed)
Can you please review the MRI and advise.

## 2014-11-17 NOTE — Telephone Encounter (Signed)
Patient is aware and result e-faxed to doctor.

## 2014-11-20 DIAGNOSIS — M5430 Sciatica, unspecified side: Secondary | ICD-10-CM | POA: Diagnosis not present

## 2014-11-23 ENCOUNTER — Ambulatory Visit: Payer: Medicare HMO | Admitting: Internal Medicine

## 2014-12-08 ENCOUNTER — Other Ambulatory Visit (HOSPITAL_COMMUNITY): Payer: Self-pay | Admitting: Neurosurgery

## 2014-12-08 DIAGNOSIS — M4316 Spondylolisthesis, lumbar region: Secondary | ICD-10-CM | POA: Diagnosis not present

## 2014-12-08 DIAGNOSIS — Z6830 Body mass index (BMI) 30.0-30.9, adult: Secondary | ICD-10-CM | POA: Diagnosis not present

## 2014-12-08 DIAGNOSIS — I10 Essential (primary) hypertension: Secondary | ICD-10-CM | POA: Diagnosis not present

## 2014-12-24 NOTE — Pre-Procedure Instructions (Signed)
Samantha Hinton  12/24/2014      [id number] - [place], [place] - [address] [place] [address] Phone: [phone] Fax: [phone]  [facility] [address] [place] - [address] [place] [address] Phone: [phone] Fax: [phone]    Your procedure is scheduled on Tues, Nov 15 @ 7:30 AM  Report to [facility] Admitting at 5:30 AM  Call this number if you have problems the morning of surgery:  [phone]   Remember:  Do not eat food or drink liquids after midnight.  Take these medicines the morning of surgery with A SIP OF WATER Bisoprolol-HCTZ(Ziac),Zyrtec(Cetirizine),Omeprazole(Prilosec),Pain Pill(if needed),ProAir and Symbicort(if needed)<Bring Your Inhalers With You>             No Goody's,BC's,Aleve,Aspirin,Ibuprofen,Motrin,Advil,Fish Oil,or any Herbal Medications.    Do not wear jewelry, make-up or nail polish.  Do not wear lotions, powders, or perfumes.  You may wear deodorant.  Do not shave 48 hours prior to surgery.    Do not bring valuables to the hospital.  [facility] is not responsible for any belongings or valuables.  Contacts, dentures or bridgework may not be worn into surgery.  Leave your suitcase in the car.  After surgery it may be brought to your room.  For patients admitted to the hospital, discharge time will be determined by your treatment team.  Patients discharged the day of surgery will not be allowed to drive home.    Special instructions:  [facility] - Preparing for Surgery  Before surgery, you can play an important role.  Because skin is not sterile, your skin needs to be as free of germs as possible.  You can reduce the number of germs on you skin by washing with CHG (chlorahexidine gluconate) soap before surgery.  CHG is an antiseptic cleaner which kills germs and bonds with the skin to continue killing  germs even after washing.  Please DO NOT use if you have an allergy to CHG or antibacterial soaps.  If your skin becomes reddened/irritated stop using the CHG and inform your nurse when you arrive at Short Stay.  Do not shave (including legs and underarms) for at least 48 hours prior to the first CHG shower.  You may shave your face.  Please follow these instructions carefully:   1.  Shower with CHG Soap the night before surgery and the                                morning of Surgery.  2.  If you choose to wash your hair, wash your hair first as usual with your       normal shampoo.  3.  After you shampoo, rinse your hair and body thoroughly to remove the                      Shampoo.  4.  Use CHG as you would any other liquid soap.  You can apply chg directly       to the skin and wash gently with scrungie or a clean washcloth.  5.  Apply the CHG Soap to your body ONLY FROM THE NECK DOWN.        Do not use on open wounds or open sores.  Avoid contact with your eyes,       ears,  mouth and genitals (private parts).  Wash genitals (private parts)       with your normal soap.  6.  Wash thoroughly, paying special attention to the area where your surgery        will be performed.  7.  Thoroughly rinse your body with warm water from the neck down.  8.  DO NOT shower/wash with your normal soap after using and rinsing off       the CHG Soap.  9.  Pat yourself dry with a clean towel.            10.  Wear clean pajamas.            11.  Place clean sheets on your bed the night of your first shower and do not        sleep with pets.  Day of Surgery  Do not apply any lotions/deoderants the morning of surgery.  Please wear clean clothes to the hospital/surgery center.    Please read over the following fact sheets that you were given. Pain Booklet, Coughing and Deep Breathing, Blood Transfusion Information, MRSA Information and Surgical Site Infection Prevention

## 2014-12-27 ENCOUNTER — Encounter (HOSPITAL_COMMUNITY)
Admission: RE | Admit: 2014-12-27 | Discharge: 2014-12-27 | Disposition: A | Discharge status: Home / Self Care | Payer: Medicare HMO | Source: Ambulatory Visit | Attending: Neurosurgery | Admitting: Neurosurgery

## 2014-12-27 ENCOUNTER — Encounter (HOSPITAL_COMMUNITY): Payer: Self-pay

## 2014-12-27 DIAGNOSIS — M4310 Spondylolisthesis, site unspecified: Secondary | ICD-10-CM | POA: Diagnosis not present

## 2014-12-27 DIAGNOSIS — Z01818 Encounter for other preprocedural examination: Secondary | ICD-10-CM | POA: Diagnosis not present

## 2014-12-27 HISTORY — DX: Other chronic pain: G89.29

## 2014-12-27 HISTORY — DX: Personal history of colonic polyps: Z86.010

## 2014-12-27 HISTORY — DX: Fibromyalgia: M79.7

## 2014-12-27 HISTORY — DX: Pneumonia, unspecified organism: J18.9

## 2014-12-27 HISTORY — DX: Nocturia: R35.1

## 2014-12-27 HISTORY — DX: Dorsalgia, unspecified: M54.9

## 2014-12-27 HISTORY — DX: Personal history of colon polyps, unspecified: Z86.0100

## 2014-12-27 HISTORY — DX: Reserved for inherently not codable concepts without codable children: IMO0001

## 2014-12-27 HISTORY — DX: Other specified postprocedural states: Z98.890

## 2014-12-27 HISTORY — DX: Gastro-esophageal reflux disease without esophagitis: K21.9

## 2014-12-27 HISTORY — DX: Hyperlipidemia, unspecified: E78.5

## 2014-12-27 HISTORY — DX: Personal history of other diseases of the respiratory system: Z87.09

## 2014-12-27 HISTORY — DX: Nausea with vomiting, unspecified: R11.2

## 2014-12-27 LAB — BASIC METABOLIC PANEL
Anion gap: 12 (ref 5–15)
BUN: 12 mg/dL (ref 6–20)
CALCIUM: 9.1 mg/dL (ref 8.9–10.3)
CHLORIDE: 98 mmol/L — AB (ref 101–111)
CO2: 28 mmol/L (ref 22–32)
CREATININE: 0.79 mg/dL (ref 0.44–1.00)
GFR calc non Af Amer: >60 mL/min (ref >60)
GLUCOSE: 111 mg/dL — AB (ref 65–99)
Potassium: 3.8 mmol/L (ref 3.5–5.1)
Sodium: 138 mmol/L (ref 135–145)

## 2014-12-27 LAB — CBC
HCT: 42.6 % (ref 36.0–46.0)
Hemoglobin: 14.1 g/dL (ref 12.0–15.0)
MCH: 30.5 pg (ref 26.0–34.0)
MCHC: 33.1 g/dL (ref 30.0–36.0)
MCV: 92.2 fL (ref 78.0–100.0)
Platelets: 161 10*3/uL (ref 150–400)
RBC: 4.62 MIL/uL (ref 3.87–5.11)
RDW: 13.1 % (ref 11.5–15.5)
WBC: 6.9 10*3/uL (ref 4.0–10.5)

## 2014-12-27 LAB — ABO/RH: ABO/RH(D): A POS

## 2014-12-27 LAB — TYPE AND SCREEN
ABO/RH(D): A POS
ANTIBODY SCREEN: NEGATIVE

## 2014-12-27 LAB — SURGICAL PCR SCREEN
MRSA, PCR: NEGATIVE
Staphylococcus aureus: NEGATIVE

## 2014-12-27 NOTE — Progress Notes (Signed)
Denies having a cardiologist  Medical Md is Dr.Wanda Panosh  EKG in epic from 02-11-14  Denies CXR in past yr  Stress test/echo done > 10 yrs ago  Denies ever having a heart cath

## 2015-01-03 ENCOUNTER — Encounter (HOSPITAL_COMMUNITY): Payer: Self-pay | Admitting: Certified Registered Nurse Anesthetist

## 2015-01-03 MED ORDER — CEFAZOLIN SODIUM-DEXTROSE 2-3 GM-% IV SOLR
2.0000 g | INTRAVENOUS | Status: AC
  Administered 2015-01-04 (×2): 2 g via INTRAVENOUS
  Filled 2015-01-03: qty 50

## 2015-01-03 NOTE — Anesthesia Preprocedure Evaluation (Addendum)
Anesthesia Evaluation  Patient identified by MRN, date of birth, ID band Patient awake  General Assessment Comment: Samantha Hinton is a pleasant 59 y.o. Female who presents today for a posterior lumbar fusion L/4-5 by Dr. Hal Neer d/t spondylolisthesis.   Pt history significant for COPD, smoking (quit 2015), GERD, PONV, and 4 neck surgeries.   Reviewed: Allergy & Precautions, NPO status , Patient's Chart, lab work & pertinent test results  History of Anesthesia Complications (+) PONV and history of anesthetic complications  Airway Mallampati: II  TM Distance: <3 FB Neck ROM: Limited   Comment: History of c spine ant and post hx fusion and removal or harcdware and shavings Dental  (+) Upper Dentures, Partial Lower, Dental Advisory Given   Pulmonary shortness of breath and with exertion, asthma , pneumonia, resolved, COPD,  COPD inhaler, former smoker,  Quit smoking 02/12/14 1/4ppd x 44 hrs  - PFT's 06/12/2012 FEV1 1.41 (60%) ratio 64 and no better p B2 And DLCO 51 corrects to 94 %    + decreased breath sounds      Cardiovascular Exercise Tolerance: Poor hypertension, Pt. on medications and Pt. 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 Bowel sounds: normal.  Peds  Hematology   Anesthesia Other Findings   Reproductive/Obstetrics                         CBC    Component Value Date/Time   WBC 6.9 12/27/2014 0757   RBC 4.62 12/27/2014 0757   HGB 14.1 12/27/2014 0757   HCT 42.6 12/27/2014 0757   PLT 161  12/27/2014 0757   MCV 92.2 12/27/2014 0757   MCH 30.5 12/27/2014 0757   MCHC 33.1 12/27/2014 0757   RDW 13.1 12/27/2014 0757   LYMPHSABS 2.2 05/24/2014 0949   MONOABS 0.5 05/24/2014 0949   EOSABS 0.1 05/24/2014 0949   BASOSABS 0.0 05/24/2014 0949     Chemistry      Component Value Date/Time   NA 138 12/27/2014 0757   K 3.8 12/27/2014 0757   CL 98* 12/27/2014 0757   CO2 28 12/27/2014 0757   BUN 12 12/27/2014 0757   CREATININE 0.79 12/27/2014 0757      Component Value Date/Time   CALCIUM 9.1 12/27/2014 0757   ALKPHOS 57 05/24/2014 0949   AST 22 05/24/2014 0949   ALT 22 05/24/2014 0949   BILITOT 0.6 05/24/2014 0949     BP Readings from Last 3 Encounters:  12/27/14 131/71  10/19/14 160/100  08/13/14 132/80    Anesthesia Physical Anesthesia Plan  ASA: II  Anesthesia Plan: General   Post-op Pain Management:    Induction: Intravenous  Airway Management Planned: Oral ETT  Additional Equipment:   Intra-op Plan:   Post-operative Plan: Extubation in OR  Informed Consent: I have reviewed the patients History and Physical, chart, labs and discussed the procedure including the risks, benefits and alternatives for the proposed anesthesia with the patient or authorized representative who has indicated his/her understanding and acceptance.   Dental advisory given  Plan Discussed with: CRNA, Anesthesiologist and  Surgeon  Anesthesia Plan Comments:        Anesthesia Quick Evaluation

## 2015-01-04 ENCOUNTER — Inpatient Hospital Stay (HOSPITAL_COMMUNITY): Payer: Medicare HMO

## 2015-01-04 ENCOUNTER — Inpatient Hospital Stay (HOSPITAL_COMMUNITY): Payer: Medicare HMO | Admitting: Certified Registered Nurse Anesthetist

## 2015-01-04 ENCOUNTER — Encounter (HOSPITAL_COMMUNITY): Payer: Self-pay | Admitting: *Deleted

## 2015-01-04 ENCOUNTER — Encounter (HOSPITAL_COMMUNITY)
Admission: RE | Disposition: A | Discharge status: Home / Self Care | Payer: Self-pay | Source: Ambulatory Visit | Attending: Neurosurgery

## 2015-01-04 ENCOUNTER — Inpatient Hospital Stay (HOSPITAL_COMMUNITY)
Admission: RE | Admit: 2015-01-04 | Discharge: 2015-01-07 | DRG: 460 | Disposition: A | Discharge status: Home / Self Care | Payer: Medicare HMO | Source: Ambulatory Visit | Attending: Neurosurgery | Admitting: Neurosurgery

## 2015-01-04 DIAGNOSIS — M797 Fibromyalgia: Secondary | ICD-10-CM | POA: Diagnosis not present

## 2015-01-04 DIAGNOSIS — Z87891 Personal history of nicotine dependence: Secondary | ICD-10-CM

## 2015-01-04 DIAGNOSIS — M4806 Spinal stenosis, lumbar region: Secondary | ICD-10-CM | POA: Diagnosis present

## 2015-01-04 DIAGNOSIS — M7138 Other bursal cyst, other site: Secondary | ICD-10-CM | POA: Diagnosis present

## 2015-01-04 DIAGNOSIS — K219 Gastro-esophageal reflux disease without esophagitis: Secondary | ICD-10-CM | POA: Diagnosis present

## 2015-01-04 DIAGNOSIS — E785 Hyperlipidemia, unspecified: Secondary | ICD-10-CM | POA: Diagnosis present

## 2015-01-04 DIAGNOSIS — J449 Chronic obstructive pulmonary disease, unspecified: Secondary | ICD-10-CM | POA: Diagnosis present

## 2015-01-04 DIAGNOSIS — I10 Essential (primary) hypertension: Secondary | ICD-10-CM | POA: Diagnosis not present

## 2015-01-04 DIAGNOSIS — M4316 Spondylolisthesis, lumbar region: Principal | ICD-10-CM | POA: Diagnosis present

## 2015-01-04 DIAGNOSIS — Z419 Encounter for procedure for purposes other than remedying health state, unspecified: Secondary | ICD-10-CM

## 2015-01-04 DIAGNOSIS — Z888 Allergy status to other drugs, medicaments and biological substances status: Secondary | ICD-10-CM | POA: Diagnosis not present

## 2015-01-04 SURGERY — POSTERIOR LUMBAR FUSION 1 LEVEL
Anesthesia: General | Site: Spine Lumbar

## 2015-01-04 MED ORDER — ARTIFICIAL TEARS OP OINT
TOPICAL_OINTMENT | OPHTHALMIC | Status: DC | PRN
  Administered 2015-01-04: 1 via OPHTHALMIC

## 2015-01-04 MED ORDER — SUCCINYLCHOLINE CHLORIDE 20 MG/ML IJ SOLN
INTRAMUSCULAR | Status: AC
  Filled 2015-01-04: qty 1

## 2015-01-04 MED ORDER — HYDROMORPHONE HCL 1 MG/ML IJ SOLN
INTRAMUSCULAR | Status: AC
  Filled 2015-01-04: qty 1

## 2015-01-04 MED ORDER — ARTIFICIAL TEARS OP OINT
TOPICAL_OINTMENT | OPHTHALMIC | Status: AC
  Filled 2015-01-04: qty 3.5

## 2015-01-04 MED ORDER — ONDANSETRON HCL 4 MG/2ML IJ SOLN: 4.0000 mg | INTRAMUSCULAR | Status: DC | PRN

## 2015-01-04 MED ORDER — PHENYLEPHRINE HCL 10 MG/ML IJ SOLN
INTRAMUSCULAR | Status: DC | PRN
  Administered 2015-01-04 (×4): 40 ug via INTRAVENOUS

## 2015-01-04 MED ORDER — LABETALOL HCL 5 MG/ML IV SOLN
INTRAVENOUS | Status: AC
  Filled 2015-01-04: qty 4

## 2015-01-04 MED ORDER — DEXAMETHASONE 4 MG PO TABS
4.0000 mg | ORAL_TABLET | Freq: Four times a day (QID) | ORAL | Status: AC
  Administered 2015-01-04 (×2): 4 mg via ORAL
  Filled 2015-01-04 (×2): qty 1

## 2015-01-04 MED ORDER — FENTANYL CITRATE (PF) 100 MCG/2ML IJ SOLN
INTRAMUSCULAR | Status: DC | PRN
  Administered 2015-01-04 (×2): 50 ug via INTRAVENOUS
  Administered 2015-01-04: 25 ug via INTRAVENOUS
  Administered 2015-01-04 (×4): 50 ug via INTRAVENOUS
  Administered 2015-01-04: 25 ug via INTRAVENOUS
  Administered 2015-01-04: 75 ug via INTRAVENOUS
  Administered 2015-01-04: 25 ug via INTRAVENOUS
  Administered 2015-01-04: 50 ug via INTRAVENOUS

## 2015-01-04 MED ORDER — PROPOFOL 10 MG/ML IV BOLUS
INTRAVENOUS | Status: AC
  Filled 2015-01-04: qty 20

## 2015-01-04 MED ORDER — METHOCARBAMOL 500 MG PO TABS
ORAL_TABLET | ORAL | Status: AC
  Filled 2015-01-04: qty 1

## 2015-01-04 MED ORDER — PHENOL 1.4 % MT LIQD: 1.0000 | OROMUCOSAL | Status: DC | PRN

## 2015-01-04 MED ORDER — PHENYLEPHRINE 40 MCG/ML (10ML) SYRINGE FOR IV PUSH (FOR BLOOD PRESSURE SUPPORT)
PREFILLED_SYRINGE | INTRAVENOUS | Status: AC
  Filled 2015-01-04: qty 10

## 2015-01-04 MED ORDER — HYDROMORPHONE HCL 1 MG/ML IJ SOLN
1.0000 mg | INTRAMUSCULAR | Status: DC | PRN
  Administered 2015-01-04 – 2015-01-05 (×3): 1 mg via INTRAMUSCULAR
  Filled 2015-01-04 (×3): qty 1

## 2015-01-04 MED ORDER — NEOSTIGMINE METHYLSULFATE 10 MG/10ML IV SOLN
INTRAVENOUS | Status: DC | PRN
  Administered 2015-01-04: 4 mg via INTRAVENOUS

## 2015-01-04 MED ORDER — BISOPROLOL-HYDROCHLOROTHIAZIDE 5-6.25 MG PO TABS
1.0000 | ORAL_TABLET | Freq: Every day | ORAL | Status: DC
  Administered 2015-01-05 – 2015-01-07 (×3): 1 via ORAL
  Filled 2015-01-04 (×3): qty 1

## 2015-01-04 MED ORDER — ROCURONIUM BROMIDE 50 MG/5ML IV SOLN
INTRAVENOUS | Status: AC
  Filled 2015-01-04: qty 1

## 2015-01-04 MED ORDER — OXYCODONE-ACETAMINOPHEN 5-325 MG PO TABS
ORAL_TABLET | ORAL | Status: AC
  Filled 2015-01-04: qty 2

## 2015-01-04 MED ORDER — SODIUM CHLORIDE 0.9 % IJ SOLN: 3.0000 mL | Freq: Two times a day (BID) | INTRAMUSCULAR | Status: DC

## 2015-01-04 MED ORDER — ONDANSETRON HCL 4 MG/2ML IJ SOLN
INTRAMUSCULAR | Status: AC
  Filled 2015-01-04: qty 2

## 2015-01-04 MED ORDER — SODIUM CHLORIDE 0.9 % IJ SOLN: 3.0000 mL | INTRAMUSCULAR | Status: DC | PRN

## 2015-01-04 MED ORDER — METHOCARBAMOL 500 MG PO TABS
500.0000 mg | ORAL_TABLET | Freq: Four times a day (QID) | ORAL | Status: DC | PRN
  Administered 2015-01-04 – 2015-01-07 (×9): 500 mg via ORAL
  Filled 2015-01-04 (×9): qty 1

## 2015-01-04 MED ORDER — CEFAZOLIN SODIUM-DEXTROSE 2-3 GM-% IV SOLR
INTRAVENOUS | Status: AC
  Filled 2015-01-04: qty 50

## 2015-01-04 MED ORDER — DEXAMETHASONE SODIUM PHOSPHATE 4 MG/ML IJ SOLN: 4.0000 mg | Freq: Four times a day (QID) | INTRAMUSCULAR | Status: AC

## 2015-01-04 MED ORDER — NEOSTIGMINE METHYLSULFATE 10 MG/10ML IV SOLN
INTRAVENOUS | Status: AC
  Filled 2015-01-04: qty 1

## 2015-01-04 MED ORDER — SODIUM CHLORIDE 0.9 % IV SOLN: 250.0000 mL | INTRAVENOUS | Status: DC

## 2015-01-04 MED ORDER — VANCOMYCIN HCL 1000 MG IV SOLR
INTRAVENOUS | Status: AC
  Filled 2015-01-04: qty 1000

## 2015-01-04 MED ORDER — SODIUM CHLORIDE 0.9 % IR SOLN
Status: DC | PRN
  Administered 2015-01-04: 09:00:00

## 2015-01-04 MED ORDER — ONDANSETRON HCL 4 MG/2ML IJ SOLN: 4.0000 mg | Freq: Once | INTRAMUSCULAR | Status: DC | PRN

## 2015-01-04 MED ORDER — PANTOPRAZOLE SODIUM 40 MG IV SOLR: 40.0000 mg | Freq: Every day | INTRAVENOUS | Status: DC

## 2015-01-04 MED ORDER — MENTHOL 3 MG MT LOZG
1.0000 | LOZENGE | OROMUCOSAL | Status: DC | PRN
  Filled 2015-01-04: qty 9

## 2015-01-04 MED ORDER — PROPOFOL 10 MG/ML IV BOLUS
INTRAVENOUS | Status: DC | PRN
  Administered 2015-01-04: 200 mg via INTRAVENOUS

## 2015-01-04 MED ORDER — ROCURONIUM BROMIDE 100 MG/10ML IV SOLN
INTRAVENOUS | Status: DC | PRN
  Administered 2015-01-04: 20 mg via INTRAVENOUS
  Administered 2015-01-04 (×2): 10 mg via INTRAVENOUS
  Administered 2015-01-04: 20 mg via INTRAVENOUS
  Administered 2015-01-04: 30 mg via INTRAVENOUS
  Administered 2015-01-04: 20 mg via INTRAVENOUS
  Administered 2015-01-04: 10 mg via INTRAVENOUS

## 2015-01-04 MED ORDER — VANCOMYCIN HCL 1000 MG IV SOLR
INTRAVENOUS | Status: DC | PRN
  Administered 2015-01-04: 1000 mg via TOPICAL

## 2015-01-04 MED ORDER — KCL IN DEXTROSE-NACL 20-5-0.45 MEQ/L-%-% IV SOLN
80.0000 mL/h | INTRAVENOUS | Status: DC
  Filled 2015-01-04 (×7): qty 1000

## 2015-01-04 MED ORDER — GLYCOPYRROLATE 0.2 MG/ML IJ SOLN
INTRAMUSCULAR | Status: DC | PRN
  Administered 2015-01-04: 0.1 mg via INTRAVENOUS
  Administered 2015-01-04: 0.6 mg via INTRAVENOUS
  Administered 2015-01-04: 0.1 mg via INTRAVENOUS

## 2015-01-04 MED ORDER — BUDESONIDE-FORMOTEROL FUMARATE 160-4.5 MCG/ACT IN AERO
2.0000 | INHALATION_SPRAY | Freq: Two times a day (BID) | RESPIRATORY_TRACT | Status: DC
  Administered 2015-01-04 – 2015-01-06 (×4): 2 via RESPIRATORY_TRACT
  Filled 2015-01-04: qty 6

## 2015-01-04 MED ORDER — OXYCODONE-ACETAMINOPHEN 5-325 MG PO TABS
1.0000 | ORAL_TABLET | ORAL | Status: DC | PRN
  Administered 2015-01-04 – 2015-01-07 (×17): 2 via ORAL
  Filled 2015-01-04 (×16): qty 2

## 2015-01-04 MED ORDER — LIDOCAINE HCL (CARDIAC) 20 MG/ML IV SOLN
INTRAVENOUS | Status: DC | PRN
  Administered 2015-01-04: 100 mg via INTRAVENOUS

## 2015-01-04 MED ORDER — LACTATED RINGERS IV SOLN
INTRAVENOUS | Status: DC | PRN
  Administered 2015-01-04 (×3): via INTRAVENOUS

## 2015-01-04 MED ORDER — ROCURONIUM BROMIDE 50 MG/5ML IV SOLN
INTRAVENOUS | Status: AC
  Filled 2015-01-04: qty 2

## 2015-01-04 MED ORDER — DEXTROSE 5 % IV SOLN
500.0000 mg | Freq: Four times a day (QID) | INTRAVENOUS | Status: DC | PRN
  Filled 2015-01-04: qty 5

## 2015-01-04 MED ORDER — ACETAMINOPHEN 325 MG PO TABS: 650.0000 mg | ORAL_TABLET | ORAL | Status: DC | PRN

## 2015-01-04 MED ORDER — MIDAZOLAM HCL 5 MG/5ML IJ SOLN
INTRAMUSCULAR | Status: DC | PRN
  Administered 2015-01-04: 2 mg via INTRAVENOUS

## 2015-01-04 MED ORDER — HYDROMORPHONE HCL 1 MG/ML IJ SOLN
0.2500 mg | INTRAMUSCULAR | Status: DC | PRN
  Administered 2015-01-04 (×4): 0.5 mg via INTRAVENOUS

## 2015-01-04 MED ORDER — DOCUSATE SODIUM 100 MG PO CAPS
100.0000 mg | ORAL_CAPSULE | Freq: Two times a day (BID) | ORAL | Status: DC
  Administered 2015-01-04 – 2015-01-07 (×7): 100 mg via ORAL
  Filled 2015-01-04 (×7): qty 1

## 2015-01-04 MED ORDER — PANTOPRAZOLE SODIUM 40 MG PO TBEC
40.0000 mg | DELAYED_RELEASE_TABLET | Freq: Every day | ORAL | Status: DC
  Administered 2015-01-04 – 2015-01-06 (×3): 40 mg via ORAL
  Filled 2015-01-04 (×3): qty 1

## 2015-01-04 MED ORDER — DEXAMETHASONE SODIUM PHOSPHATE 10 MG/ML IJ SOLN
INTRAMUSCULAR | Status: AC
  Administered 2015-01-04: 10 mg via INTRAVENOUS
  Filled 2015-01-04: qty 1

## 2015-01-04 MED ORDER — 0.9 % SODIUM CHLORIDE (POUR BTL) OPTIME
TOPICAL | Status: DC | PRN
  Administered 2015-01-04: 1000 mL

## 2015-01-04 MED ORDER — FENTANYL CITRATE (PF) 250 MCG/5ML IJ SOLN
INTRAMUSCULAR | Status: AC
  Filled 2015-01-04: qty 5

## 2015-01-04 MED ORDER — EPHEDRINE SULFATE 50 MG/ML IJ SOLN
INTRAMUSCULAR | Status: AC
  Filled 2015-01-04: qty 1

## 2015-01-04 MED ORDER — GLYCOPYRROLATE 0.2 MG/ML IJ SOLN
INTRAMUSCULAR | Status: AC
  Filled 2015-01-04: qty 3

## 2015-01-04 MED ORDER — ACETAMINOPHEN 650 MG RE SUPP: 650.0000 mg | RECTAL | Status: DC | PRN

## 2015-01-04 MED ORDER — GLYCOPYRROLATE 0.2 MG/ML IJ SOLN
INTRAMUSCULAR | Status: AC
  Filled 2015-01-04: qty 1

## 2015-01-04 MED ORDER — DEXAMETHASONE SODIUM PHOSPHATE 10 MG/ML IJ SOLN
INTRAMUSCULAR | Status: AC
  Filled 2015-01-04: qty 1

## 2015-01-04 MED ORDER — CEFAZOLIN SODIUM-DEXTROSE 2-3 GM-% IV SOLR
2.0000 g | Freq: Three times a day (TID) | INTRAVENOUS | Status: AC
  Administered 2015-01-04 – 2015-01-05 (×2): 2 g via INTRAVENOUS
  Filled 2015-01-04 (×2): qty 50

## 2015-01-04 MED ORDER — MIDAZOLAM HCL 2 MG/2ML IJ SOLN
INTRAMUSCULAR | Status: AC
  Filled 2015-01-04: qty 4

## 2015-01-04 MED ORDER — ONDANSETRON HCL 4 MG/2ML IJ SOLN
INTRAMUSCULAR | Status: DC | PRN
  Administered 2015-01-04: 4 mg via INTRAVENOUS

## 2015-01-04 MED ORDER — LABETALOL HCL 5 MG/ML IV SOLN
INTRAVENOUS | Status: DC | PRN
  Administered 2015-01-04: 5 mg via INTRAVENOUS

## 2015-01-04 MED ORDER — LIDOCAINE HCL (CARDIAC) 20 MG/ML IV SOLN
INTRAVENOUS | Status: AC
Start: 2015-01-04 — End: 2015-01-04
  Filled 2015-01-04: qty 5

## 2015-01-04 MED ORDER — THROMBIN 20000 UNITS EX SOLR
CUTANEOUS | Status: DC | PRN
  Administered 2015-01-04: 20 mL via TOPICAL

## 2015-01-04 MED ORDER — STERILE WATER FOR INJECTION IJ SOLN
INTRAMUSCULAR | Status: AC
  Filled 2015-01-04: qty 10

## 2015-01-04 MED ORDER — PHENYLEPHRINE HCL 10 MG/ML IJ SOLN
10.0000 mg | INTRAVENOUS | Status: DC | PRN
  Administered 2015-01-04: 20 ug/min via INTRAVENOUS

## 2015-01-04 MED ORDER — BISACODYL 5 MG PO TBEC: 5.0000 mg | DELAYED_RELEASE_TABLET | Freq: Every day | ORAL | Status: DC | PRN

## 2015-01-04 MED ORDER — DEXAMETHASONE SODIUM PHOSPHATE 10 MG/ML IJ SOLN: 10.0000 mg | INTRAMUSCULAR | Status: DC

## 2015-01-04 SURGICAL SUPPLY — 67 items
BAG DECANTER FOR FLEXI CONT (MISCELLANEOUS) ×2 IMPLANT
BENZOIN TINCTURE PRP APPL 2/3 (GAUZE/BANDAGES/DRESSINGS) ×4 IMPLANT
BLADE CLIPPER SURG (BLADE) IMPLANT
BRUSH SCRUB EZ PLAIN DRY (MISCELLANEOUS) ×2 IMPLANT
BUR CUTTER 7.0 ROUND (BURR) ×2 IMPLANT
BUR MATCHSTICK NEURO 3.0 LAGG (BURR) ×2 IMPLANT
CANISTER SUCT 3000ML PPV (MISCELLANEOUS) ×2 IMPLANT
CONT SPEC 4OZ CLIKSEAL STRL BL (MISCELLANEOUS) ×2 IMPLANT
COVER BACK TABLE 60X90IN (DRAPES) ×2 IMPLANT
DRAPE C-ARM 42X72 X-RAY (DRAPES) ×4 IMPLANT
DRAPE C-ARMOR (DRAPES) ×2 IMPLANT
DRAPE LAPAROTOMY 100X72X124 (DRAPES) ×2 IMPLANT
DRAPE SURG 17X23 STRL (DRAPES) ×4 IMPLANT
DRSG OPSITE POSTOP 4X6 (GAUZE/BANDAGES/DRESSINGS) ×2 IMPLANT
DURAPREP 26ML APPLICATOR (WOUND CARE) ×2 IMPLANT
ELECT REM PT RETURN 9FT ADLT (ELECTROSURGICAL) ×2
ELECTRODE REM PT RTRN 9FT ADLT (ELECTROSURGICAL) ×1 IMPLANT
EVACUATOR 1/8 PVC DRAIN (DRAIN) ×4 IMPLANT
GAUZE SPONGE 4X4 12PLY STRL (GAUZE/BANDAGES/DRESSINGS) ×2 IMPLANT
GAUZE SPONGE 4X4 16PLY XRAY LF (GAUZE/BANDAGES/DRESSINGS) ×2 IMPLANT
GLOVE BIO SURGEON STRL SZ7 (GLOVE) ×2 IMPLANT
GLOVE BIOGEL PI IND STRL 7.0 (GLOVE) ×2 IMPLANT
GLOVE BIOGEL PI IND STRL 7.5 (GLOVE) ×3 IMPLANT
GLOVE BIOGEL PI INDICATOR 7.0 (GLOVE) ×2
GLOVE BIOGEL PI INDICATOR 7.5 (GLOVE) ×3
GLOVE ECLIPSE 8.0 STRL XLNG CF (GLOVE) ×6 IMPLANT
GLOVE EXAM NITRILE LRG STRL (GLOVE) IMPLANT
GLOVE EXAM NITRILE MD LF STRL (GLOVE) IMPLANT
GLOVE EXAM NITRILE XS STR PU (GLOVE) IMPLANT
GLOVE SS BIOGEL STRL SZ 7 (GLOVE) ×1 IMPLANT
GLOVE SUPERSENSE BIOGEL SZ 7 (GLOVE) ×1
GLOVE SURG SS PI 7.0 STRL IVOR (GLOVE) ×2 IMPLANT
GOWN STRL REUS W/ TWL LRG LVL3 (GOWN DISPOSABLE) IMPLANT
GOWN STRL REUS W/ TWL XL LVL3 (GOWN DISPOSABLE) ×2 IMPLANT
GOWN STRL REUS W/TWL 2XL LVL3 (GOWN DISPOSABLE) IMPLANT
GOWN STRL REUS W/TWL LRG LVL3 (GOWN DISPOSABLE)
GOWN STRL REUS W/TWL XL LVL3 (GOWN DISPOSABLE) ×2
K-WIRE NITHNOL TROCAR TIP (WIRE) ×8 IMPLANT
KIT BASIN OR (CUSTOM PROCEDURE TRAY) ×2 IMPLANT
KIT ROOM TURNOVER OR (KITS) ×2 IMPLANT
LIQUID BAND (GAUZE/BANDAGES/DRESSINGS) IMPLANT
NEEDLE HYPO 22GX1.5 SAFETY (NEEDLE) ×2 IMPLANT
NEEDLE TARGETING (NEEDLE) ×2 IMPLANT
NS IRRIG 1000ML POUR BTL (IV SOLUTION) ×2 IMPLANT
PACK LAMINECTOMY NEURO (CUSTOM PROCEDURE TRAY) ×2 IMPLANT
PAD ARMBOARD 7.5X6 YLW CONV (MISCELLANEOUS) ×6 IMPLANT
PATTIES SURGICAL .75X.75 (GAUZE/BANDAGES/DRESSINGS) ×2 IMPLANT
PEEK OPTIMA 12X9X26MM (Cage) ×4 IMPLANT
PUTTY DBM 5CC CALC GRAN ×2 IMPLANT
PUTTY GAMMA BSM 5CC (Putty) ×2 IMPLANT
ROD PATHFINDER 40MM (Rod) ×4 IMPLANT
SCREW POLYAXIA MIS 6.5X40MM (Screw) ×8 IMPLANT
SHEATH PAT (SHEATH) ×2 IMPLANT
SPONGE LAP 4X18 X RAY DECT (DISPOSABLE) IMPLANT
SPONGE SURGIFOAM ABS GEL 100 (HEMOSTASIS) ×2 IMPLANT
STRIP CLOSURE SKIN 1/2X4 (GAUZE/BANDAGES/DRESSINGS) ×4 IMPLANT
SUT PROLENE 0 CT 1 30 (SUTURE) IMPLANT
SUT VIC AB 0 CT1 18XCR BRD8 (SUTURE) ×2 IMPLANT
SUT VIC AB 0 CT1 8-18 (SUTURE) ×2
SUT VIC AB 2-0 OS6 18 (SUTURE) ×6 IMPLANT
SUT VIC AB 3-0 CP2 18 (SUTURE) ×2 IMPLANT
TOP CLSR SEQUOIA (Orthopedic Implant) ×8 IMPLANT
TOWEL OR 17X24 6PK STRL BLUE (TOWEL DISPOSABLE) ×2 IMPLANT
TOWEL OR 17X26 10 PK STRL BLUE (TOWEL DISPOSABLE) ×2 IMPLANT
TRAP SPECIMEN MUCOUS 40CC (MISCELLANEOUS) ×2 IMPLANT
TRAY FOLEY W/METER SILVER 14FR (SET/KITS/TRAYS/PACK) ×2 IMPLANT
WATER STERILE IRR 1000ML POUR (IV SOLUTION) ×2 IMPLANT

## 2015-01-04 NOTE — Progress Notes (Signed)
Utilization review completed.  

## 2015-01-04 NOTE — Anesthesia Postprocedure Evaluation (Signed)
  Anesthesia Post-op Note  Patient: Samantha Hinton  Procedure(s) Performed: Procedure(s): POSTERIOR LUMBAR FUSION Lumbar four-five (N/A)  Patient Location: PACU  Anesthesia Type:General  Level of Consciousness: awake, alert , oriented and patient cooperative  Airway and Oxygen Therapy: Patient Spontanous Breathing and Patient connected to nasal cannula oxygen  Post-op Pain: mild  Post-op Assessment: Post-op Vital signs reviewed, Patient's Cardiovascular Status Stable, Respiratory Function Stable, Patent Airway, No signs of Nausea or vomiting and Pain level controlled LLE Motor Response: Purposeful movement, Responds to commands LLE Sensation: Full sensation RLE Motor Response: Purposeful movement, Responds to commands RLE Sensation: Full sensation      Post-op Vital Signs: stable  Last Vitals:  Filed Vitals:   01/04/15 1215  BP: 105/63  Pulse: 77  Temp:   Resp: 24    Complications: No apparent anesthesia complications

## 2015-01-04 NOTE — Transfer of Care (Signed)
Immediate Anesthesia Transfer of Care Note  Patient: Samantha Hinton  Procedure(s) Performed: Procedure(s): POSTERIOR LUMBAR FUSION Lumbar four-five (N/A)  Patient Location: PACU  Anesthesia Type:General  Level of Consciousness: awake, alert , oriented and patient cooperative  Airway & Oxygen Therapy: Patient Spontanous Breathing and Patient connected to face mask oxygen  Post-op Assessment: Report given to RN, Post -op Vital signs reviewed and stable and Patient moving all extremities X 4  Post vital signs: Reviewed and stable  Last Vitals:  Filed Vitals:   01/04/15 1156  BP:   Pulse:   Temp: 37.2 C  Resp:     Complications: No apparent anesthesia complications

## 2015-01-04 NOTE — H&P (Signed)
Samantha Hinton is an 59 y.o. female.   Chief Complaint: Back pain into the left leg HPI: The patient is a 59 year old female who is evaluated in the office for back pain with radiation the left leg of a few months duration. There is no inciting event. She muscle of the problem in his medical doctor. She tried ice and heat and anti-inflammatory medications without relief. She then did prednisone which gave her no relief. She is taking Vicodin which did not help. She was evaluated in the office and then sent for an MRI scan. This showed spondylolisthesis with a large synovial cyst causing marked neural compression. After discussing the options the patient requested surgery now comes for decompression with removal of synovial cyst interbody fusion and pedicle screws mentation. I've had a long discussion with her regarding the risks and benefits of surgical intervention. The risks discussed include but are not limited to bleeding infection weakness numbness paralysis spinal fluid leak nonunion coma and death. We have discussed alternative methods of therapy along with the risks and benefits of nonintervention. She's had the opportunity to rest numerous questions and appears to understand. With this information in hand she has requested that we proceed with surgery.  Past Medical History  Diagnosis Date  . Genital warts   . Hypertension     takes Ziac daily  . COPD (chronic obstructive pulmonary disease) (HCC)     Symbicort daily and ProAir as needed  . Emphysema   . Emphysema of lung ([place])   . History of colon polyps   . PONV (postoperative nausea and vomiting)   . Hyperlipidemia     has been off of meds d/t getting sick from them.Not been addressed again.   Marland Kitchen Shortness of breath dyspnea     thinks d/t pain meds and notices with exertion  . Pneumonia     hx of-8+yrs ago  . History of bronchitis 01/2014  . Stroke ([place])     16 yrs ago--left eye   . Fibromyalgia   . Chronic back pain    buldging disc and tumor.Spondylolisthesis  . GERD (gastroesophageal reflux disease)     takes Omeprazole daily  . Nocturia     Past Surgical History  Procedure Laterality Date  . Cholecystectomy  1983  . Neck surgery  [phone]    c spine ant and post hx fusion and removal or harcdware and shavings  . Appendectomy  1967  . Tonsillectomy    . Anterior (cystocele) and posterior repair (rectocele) with xenform graft and sacrospinous fixation    . Abdominal hysterectomy  1983    partial   . Breast surgery Right     diseased milk glands  . Adenoidectomy    . Diagnostic laparoscopy      cyst removed from ovaries   . Colonoscopy    . Esophagogastroduodenoscopy      Family History  Problem Relation Age of Onset  . CAD Father     tumor between heart and lung   . CAD Sister     ? dx   . Hypertension Father   . Hypertension Mother   . Lung cancer Mother     died 23   . Diabetes Maternal Grandmother     late 66s    Social History:  reports that she has quit smoking. Her smoking use included Cigarettes. She has a 11 pack-year smoking history. She has never used smokeless tobacco. She reports that she drinks about 2.4 oz of alcohol  per week. She reports that she does not use illicit drugs.  Allergies:  Allergies  Allergen Reactions  . Wellbutrin [Bupropion] Nausea And Vomiting    Medications Prior to Admission  Medication Sig Dispense Refill  . bisoprolol-hydrochlorothiazide (ZIAC) 5-6.25 MG per tablet TAKE ONE TABLET BY MOUTH ONCE DAILY 90 tablet 1  . cetirizine (ZYRTEC) 10 MG tablet Take 10 mg by mouth daily.    Marland Kitchen CINNAMON PO Take 1 tablet by mouth daily.     . Multiple Vitamins-Minerals (CENTRUM PO) Take 1 tablet by mouth daily.     Marland Kitchen omeprazole (PRILOSEC) 20 MG capsule Take 1 capsule (20 mg total) by mouth daily. 30 capsule 3  . Oxycodone HCl 10 MG TABS Take 1 tablet by mouth every 4 (four) hours as needed.  0  . PROAIR HFA 108 (90 BASE) MCG/ACT inhaler INHALE TWO  PUFFS INTO LUNGS EVERY 4 HOURS AS NEEDED FOR WHEEZING OR SHORTNESS OF BREATH 1 Inhaler 0  . SYMBICORT 160-4.5 MCG/ACT inhaler TAKE 2 PUFFS FIRST THING IN THE MORNING AND THEN ANOTHER 2 PUFFS ABOUT 12 HOURS LATER (Patient taking differently: TAKE 2 PUFFS FIRST THING IN THE MORNING AND THEN ANOTHER 2 PUFFS ABOUT 12 HOURS LATER AS NEEDED) 1 Inhaler 5    No results found for this or any previous visit (from the past 66 hour(s)). No results found.  Negative except for the issues mentioned history of present illness along with some abdominal pain or shortness of breath  Blood pressure 149/77, pulse 66, temperature 97.7 F (36.5 C), temperature source Oral, resp. rate 12, SpO2 98 %.  The patient is awake alert and oriented. She is no facial asymmetry. She has absent ankle jerk reflexes. Along with normal strength and sensation Assessment/Plan Impression is that of listhesis with synovial cyst at L4-5. The plan is for interbody fusion with instrumentation.  Faythe Ghee, MD 01/04/2015, 7:28 AM

## 2015-01-04 NOTE — Progress Notes (Signed)
Report given to elise rn as caregiver 

## 2015-01-04 NOTE — Anesthesia Procedure Notes (Signed)
Procedure Name: Intubation Date/Time: 01/04/2015 7:39 AM Performed by: Layla Maw Pre-anesthesia Checklist: Patient identified, Patient being monitored, Timeout performed, Emergency Drugs available and Suction available Patient Re-evaluated:Patient Re-evaluated prior to inductionOxygen Delivery Method: Circle System Utilized Preoxygenation: Pre-oxygenation with 100% oxygen Intubation Type: IV induction Ventilation: Mask ventilation without difficulty and Oral airway inserted - appropriate to patient size Laryngoscope Size: Miller and 3 Grade View: Grade I Tube type: Oral Tube size: 7.5 mm Number of attempts: 1 Airway Equipment and Method: Stylet Placement Confirmation: ETT inserted through vocal cords under direct vision,  positive ETCO2 and breath sounds checked- equal and bilateral Secured at: 21 cm Tube secured with: Tape Dental Injury: Teeth and Oropharynx as per pre-operative assessment

## 2015-01-04 NOTE — Op Note (Signed)
Preop diagnosis: Grade 1 spondylolisthesis L4-5 with left L4-5 synovial cyst and severe central and lateral recess stenosis Postop diagnosis: Same Procedure: Bilateral L4-5 decompressive laminectomy for relief of central and lateral recess stenosis with removal of synovial cyst Bilateral L4-5 microdiscectomy L4-5 posterior lumbar interbody fusion with 12 x 9 x 26 mm cages L4-5 posterolateral fusion L4-5 nonsegmental instrumentation with Pathfinder pedicle screw percutaneous system Surgeon: Bracha Frankowski Asst.: Ditty  After and placed the prone position the patient's back was prepped and draped in usual sterile fashion. Localizing x-ray was taken prior to incision to identify the appropriate level. Midline incision was made above the spinous processes of L4 and L5. The incision was carried on the dorsal lumbar fascia and we separated the dorsal lumbar fascia from the subcutaneous tissue. Subperiosteal dissection was then carried out on the spinous processes and lamina facet joint of L4-5 and self-retaining retractor was placed for exposure. X-ray showed approach the appropriate level. Spinous processes of L4 and L5 were removed. Starting the patient's left side generous laminotomy was performed by removing the inferior three quarters of the L4 lamina the medial three quarters of the facet joint the superior one third of the L5 lamina. Residual bone was removed in a piecemeal fashion. Hypertrophic ligamentum flavum and a large synovial cyst removed to decompress the left side of the thecal sac and L5 nerve root. Summary decompression was then carried out on the right side the residual midline structures were removed to complete the bilateral decompression and relieve the central and lateral recess stenosis more so than needed for interbody fusion. The disc space was then incised bilaterally and thoroughly cleaned out with pituitary rongeurs and curettes. It was then prepared for interbody fusion with a variety of  instruments. He was distracted up to a 12 mm size and this was felt to be a good choice. 12 x 9 x 26 mm cages were chosen and filled with a mixture of autologous bone and morselized allograft. After thoroughly preparing the disc space the cages were impacted without difficulty. Prior to placing the second cage autologous bone morselized allograft were placed deep within the interspace to help with interbody fusion. We then irrigated copiously controlled any bleeding with upper coagulation and Gelfoam. The residual facet joint was decorticated and a mixture of autologous bone morselized allograft were placed for posterolateral fusion. We then closed the dorsal lumbar fascia and placed percutaneous pedicle screws in standard fashion. We passed the Jamshidi needle through the adequacy L4 and L5 bilaterally and then placed guidewires. We tapped with a 6 mm tap and placed 6.5 x 40 mm screws bilaterally at L4 and L5. We then passed a rod down through the towers and secured them to the top of the screws with top loading nuts. We did tightening and final tightening with torque and counter torque and remove the towers. Final fluoroscopy in AP lateral direction looked excellent. We closed the small openings dorsal lumbar fascia and then irrigated copiously once more. Left drain in the suprafascial space and then closed the rest of the wound in multiple layers of Vicryl and a running locking Prolene on the skin. A sterile dressing was then applied and the patient was extubated and taken to recovery in stable condition.

## 2015-01-05 NOTE — Progress Notes (Signed)
Patient ID: Samantha Hinton, female   DOB: [birth year], 59 y.o.   MRN: [id number] Afeb, vss No new neuro issues Leg pain basically gone. Incisional pain only Wound clean and dry. Increase activity today, hopefully home tomorrow.

## 2015-01-06 MED ORDER — ONDANSETRON 4 MG PO TBDP
4.0000 mg | ORAL_TABLET | Freq: Once | ORAL | Status: AC
  Administered 2015-01-06: 4 mg via ORAL
  Filled 2015-01-06: qty 1

## 2015-01-06 NOTE — Progress Notes (Signed)
Patient ID: Samantha Hinton, female   DOB: 19-Dec-1955, 59 y.o.   MRN: [id number] Afeb, vss No new neuro issues More back pain today Wound clean and dry. Will have her stay until tomorrow.

## 2015-01-07 MED ORDER — OXYCODONE HCL 10 MG PO TABS: 10.0000 mg | ORAL_TABLET | ORAL | Status: DC | PRN

## 2015-01-07 MED ORDER — METHOCARBAMOL 500 MG PO TABS: 500.0000 mg | ORAL_TABLET | Freq: Four times a day (QID) | ORAL | Status: DC | PRN

## 2015-01-07 NOTE — Progress Notes (Signed)
Patient alert and oriented, mae's well, voiding adequate amount of urine, swallowing without difficulty, c/o mild pain. Patient discharged home with family. Script and discharged instructions given to patient. Patient and family stated understanding of d/c instructions given and has an appointment with MD.   

## 2015-01-07 NOTE — Discharge Summary (Signed)
  Physician Discharge Summary  Patient ID: Samantha Hinton MRN: [id number] DOB/AGE: 59-20-1957 59 y.o.  Admit date: 01/04/2015 Discharge date: 01/07/2015  Admission Diagnoses:  Discharge Diagnoses:  Active Problems:   Spondylolisthesis of lumbar region   Discharged Condition: good  Hospital Course: Surgery 3 days ago with L 45 plif. Did well, but slow to mobilize. Wound healing well. By pod 3, ambulating well. Wound clean and d ry. Home with specific instructions given.  Consults: None  Significant Diagnostic Studies: none  Treatments: surgery: L 45 plif  Discharge Exam: Blood pressure 125/69, pulse 72, temperature 98.6 F (37 C), temperature source Oral, resp. rate 18, SpO2 96 %. Incision/Wound:clean and dry; no new neuro issues  Disposition: 01-Home or Self Care     Medication List    ASK your doctor about these medications        bisoprolol-hydrochlorothiazide 5-6.25 MG tablet  Commonly known as:  ZIAC  TAKE ONE TABLET BY MOUTH ONCE DAILY     CENTRUM PO  Take 1 tablet by mouth daily.     cetirizine 10 MG tablet  Commonly known as:  ZYRTEC  Take 10 mg by mouth daily.     CINNAMON PO  Take 1 tablet by mouth daily.     omeprazole 20 MG capsule  Commonly known as:  PRILOSEC  Take 1 capsule (20 mg total) by mouth daily.     Oxycodone HCl 10 MG Tabs  Take 1 tablet by mouth every 4 (four) hours as needed.     PROAIR HFA 108 (90 BASE) MCG/ACT inhaler  Generic drug:  albuterol  INHALE TWO PUFFS INTO LUNGS EVERY 4 HOURS AS NEEDED FOR WHEEZING OR SHORTNESS OF BREATH     SYMBICORT 160-4.5 MCG/ACT inhaler  Generic drug:  budesonide-formoterol  TAKE 2 PUFFS FIRST THING IN THE MORNING AND THEN ANOTHER 2 PUFFS ABOUT 12 HOURS LATER         At home rest most of the time. Get up 9 or 10 times each day and take a 15 or 20 minute walk. No riding in the car and to your first postoperative appointment. If you have neck surgery you may shower from the chest down  starting on the third postoperative day. If you had back surgery he may start showering on the third postoperative day with saran wrap wrapped around your incisional area 3 times. After the shower remove the saran wrap. Take pain medicine as needed and other medications as instructed. Call my office for an appointment.  SignedFaythe Ghee, MD 01/07/2015, 9:28 AM

## 2015-02-03 ENCOUNTER — Other Ambulatory Visit: Payer: Self-pay | Admitting: Gastroenterology

## 2015-02-03 ENCOUNTER — Other Ambulatory Visit: Payer: Self-pay | Admitting: Internal Medicine

## 2015-02-04 ENCOUNTER — Telehealth: Payer: Self-pay | Admitting: Family Medicine

## 2015-02-04 ENCOUNTER — Telehealth: Payer: Self-pay | Admitting: *Deleted

## 2015-02-04 ENCOUNTER — Other Ambulatory Visit: Payer: Self-pay | Admitting: Family Medicine

## 2015-02-04 DIAGNOSIS — Z0000 Encounter for general adult medical examination without abnormal findings: Secondary | ICD-10-CM

## 2015-02-04 NOTE — Telephone Encounter (Signed)
[facility], [address] [phone] Refill request sent for bisoprolol-hydrochlorothiazide [facility]) 5-6.25 MG per tablet

## 2015-02-04 NOTE — Telephone Encounter (Signed)
Duplicate Request.

## 2015-02-04 NOTE — Telephone Encounter (Signed)
lmom for pt to call back

## 2015-02-04 NOTE — Telephone Encounter (Signed)
Pt has been sch

## 2015-02-04 NOTE — Telephone Encounter (Signed)
Pt is due for yearly physical and fasting lab work in April 2017.  I have placed the lab orders.  Please help the pt to make both appointments.  Thanks!

## 2015-02-04 NOTE — Telephone Encounter (Signed)
Sent to the pharmacy by e-scribe.  Message sent to scheduling to help the pt make yearly lab and cpx appt for April 2017

## 2015-02-09 DIAGNOSIS — M4316 Spondylolisthesis, lumbar region: Secondary | ICD-10-CM | POA: Diagnosis not present

## 2015-02-09 DIAGNOSIS — Z6829 Body mass index (BMI) 29.0-29.9, adult: Secondary | ICD-10-CM | POA: Diagnosis not present

## 2015-02-14 ENCOUNTER — Encounter (HOSPITAL_COMMUNITY): Payer: Self-pay | Admitting: Emergency Medicine

## 2015-02-14 DIAGNOSIS — Z7951 Long term (current) use of inhaled steroids: Secondary | ICD-10-CM | POA: Insufficient documentation

## 2015-02-14 DIAGNOSIS — G8929 Other chronic pain: Secondary | ICD-10-CM | POA: Insufficient documentation

## 2015-02-14 DIAGNOSIS — Z8619 Personal history of other infectious and parasitic diseases: Secondary | ICD-10-CM | POA: Insufficient documentation

## 2015-02-14 DIAGNOSIS — M545 Low back pain: Secondary | ICD-10-CM | POA: Insufficient documentation

## 2015-02-14 DIAGNOSIS — J449 Chronic obstructive pulmonary disease, unspecified: Secondary | ICD-10-CM | POA: Diagnosis not present

## 2015-02-14 DIAGNOSIS — Z86010 Personal history of colonic polyps: Secondary | ICD-10-CM | POA: Diagnosis not present

## 2015-02-14 DIAGNOSIS — Z79899 Other long term (current) drug therapy: Secondary | ICD-10-CM | POA: Insufficient documentation

## 2015-02-14 DIAGNOSIS — Z8673 Personal history of transient ischemic attack (TIA), and cerebral infarction without residual deficits: Secondary | ICD-10-CM | POA: Insufficient documentation

## 2015-02-14 DIAGNOSIS — I10 Essential (primary) hypertension: Secondary | ICD-10-CM | POA: Insufficient documentation

## 2015-02-14 DIAGNOSIS — Z8701 Personal history of pneumonia (recurrent): Secondary | ICD-10-CM | POA: Insufficient documentation

## 2015-02-14 DIAGNOSIS — K219 Gastro-esophageal reflux disease without esophagitis: Secondary | ICD-10-CM | POA: Diagnosis not present

## 2015-02-14 DIAGNOSIS — Z8639 Personal history of other endocrine, nutritional and metabolic disease: Secondary | ICD-10-CM | POA: Diagnosis not present

## 2015-02-14 DIAGNOSIS — Z87891 Personal history of nicotine dependence: Secondary | ICD-10-CM | POA: Diagnosis not present

## 2015-02-14 MED ORDER — OXYCODONE-ACETAMINOPHEN 5-325 MG PO TABS
ORAL_TABLET | ORAL | Status: AC
  Filled 2015-02-14: qty 1

## 2015-02-14 MED ORDER — OXYCODONE-ACETAMINOPHEN 5-325 MG PO TABS
1.0000 | ORAL_TABLET | Freq: Once | ORAL | Status: AC
  Administered 2015-02-14: 1 via ORAL

## 2015-02-14 NOTE — ED Notes (Signed)
Pt had back surgery approx 1 month ago and was taking 20mg  percocet's and a muscle relaxer. Pt has been out of pain medication x 3 days. sts her back is not healed enough to be off the medications but the withdrawal from the pain meds is making her back hurt worse. sts she does not want as strong of pain mediations because she does not want to have to go through withdrawals again.

## 2015-02-15 ENCOUNTER — Emergency Department (HOSPITAL_COMMUNITY)
Admission: EM | Admit: 2015-02-15 | Discharge: 2015-02-15 | Disposition: A | Discharge status: Home / Self Care | Payer: Medicare HMO | Attending: Emergency Medicine | Admitting: Emergency Medicine

## 2015-02-15 DIAGNOSIS — Z8673 Personal history of transient ischemic attack (TIA), and cerebral infarction without residual deficits: Secondary | ICD-10-CM | POA: Diagnosis not present

## 2015-02-15 DIAGNOSIS — M545 Low back pain: Secondary | ICD-10-CM | POA: Diagnosis not present

## 2015-02-15 DIAGNOSIS — G8929 Other chronic pain: Secondary | ICD-10-CM | POA: Diagnosis not present

## 2015-02-15 DIAGNOSIS — Z87891 Personal history of nicotine dependence: Secondary | ICD-10-CM | POA: Diagnosis not present

## 2015-02-15 DIAGNOSIS — Z8701 Personal history of pneumonia (recurrent): Secondary | ICD-10-CM | POA: Diagnosis not present

## 2015-02-15 DIAGNOSIS — Z7951 Long term (current) use of inhaled steroids: Secondary | ICD-10-CM | POA: Diagnosis not present

## 2015-02-15 DIAGNOSIS — K219 Gastro-esophageal reflux disease without esophagitis: Secondary | ICD-10-CM | POA: Diagnosis not present

## 2015-02-15 DIAGNOSIS — Z86010 Personal history of colonic polyps: Secondary | ICD-10-CM | POA: Diagnosis not present

## 2015-02-15 DIAGNOSIS — I10 Essential (primary) hypertension: Secondary | ICD-10-CM | POA: Diagnosis not present

## 2015-02-15 DIAGNOSIS — J449 Chronic obstructive pulmonary disease, unspecified: Secondary | ICD-10-CM | POA: Diagnosis not present

## 2015-02-15 MED ORDER — KETOROLAC TROMETHAMINE 60 MG/2ML IM SOLN
60.0000 mg | Freq: Once | INTRAMUSCULAR | Status: AC
  Administered 2015-02-15: 60 mg via INTRAMUSCULAR
  Filled 2015-02-15: qty 2

## 2015-02-15 MED ORDER — METHOCARBAMOL 500 MG PO TABS
500.0000 mg | ORAL_TABLET | Freq: Once | ORAL | Status: AC
  Administered 2015-02-15: 500 mg via ORAL
  Filled 2015-02-15: qty 1

## 2015-02-15 MED ORDER — OXYCODONE-ACETAMINOPHEN 5-325 MG PO TABS
2.0000 | ORAL_TABLET | Freq: Once | ORAL | Status: AC
  Administered 2015-02-15: 2 via ORAL
  Filled 2015-02-15: qty 2

## 2015-02-15 NOTE — ED Provider Notes (Signed)
CSN: [id number]     Arrival date & time 02/14/15  2217 History   By signing my name below, I, Samantha Hinton, attest that this documentation has been prepared under the direction and in the presence of [name], MD.  Electronically Signed: Forrestine Hinton, ED Scribe. 02/15/2015. 2:59 AM.  Chief Complaint  Patient presents with  . Back Pain   HPI  HPI Comments: Samantha Hinton is a 59 y.o. female with a PMHx of COPD, HTN, hyperlipemia, and stroke who presents to the Emergency Department complaining of constant, ongoing lower back pain x 1 month; worsened in last 3 days. She also reports chills, diaphoresis, and sneezing. Pt underwent back surgery on 11/15 and was started on Percocet 20 mg every 6 hours along with a muscle relaxant. No new injury or trauma. She states her neuro surgeon advised weaning off of her medications and has been without any doses for 3 days now. No additional OTC medications attempted prior to arrival. Pt has not yet contacted her surgeon regarding her worsening pain. She is requesting a mild pain medication this evening as she is concerned she may go through withdrawals again.  PCP: [name], MD    Past Medical History  Diagnosis Date  . Genital warts   . Hypertension     takes Ziac daily  . COPD (chronic obstructive pulmonary disease) (HCC)     Symbicort daily and ProAir as needed  . Emphysema   . Emphysema of lung ([place])   . History of colon polyps   . PONV (postoperative nausea and vomiting)   . Hyperlipidemia     has been off of meds d/t getting sick from them.Not been addressed again.   Marland Kitchen Shortness of breath dyspnea     thinks d/t pain meds and notices with exertion  . Pneumonia     hx of-8+yrs ago  . History of bronchitis 01/2014  . 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 Fibromyalgia   . Chronic back pain     buldging disc and tumor.Spondylolisthesis  . GERD (gastroesophageal reflux disease)     takes Omeprazole daily  . Nocturia     Past Surgical History  Procedure Laterality Date  . Cholecystectomy  1983  . Neck surgery  [phone]    c spine ant and post hx fusion and removal or harcdware and shavings  . Appendectomy  1967  . Tonsillectomy    . Anterior (cystocele) and posterior repair (rectocele) with xenform graft and sacrospinous fixation    . Abdominal hysterectomy  1983    partial   . Breast surgery Right     diseased milk glands  . Adenoidectomy    . Diagnostic laparoscopy      cyst removed from ovaries   . Colonoscopy    . Esophagogastroduodenoscopy     Family History  Problem Relation Age of Onset  . CAD Father     tumor between heart and lung   . CAD Sister     ? dx   . Hypertension Father   . Hypertension Mother   . Lung cancer Mother     died 23   . Diabetes Maternal Grandmother     late 93s    Social History  Substance Use Topics  . Smoking status: Former Smoker -- 0.25 packs/day for 44 years    Types: Cigarettes  . Smokeless tobacco: Never Used     Comment: quit smoking in Dec 2015  .  Alcohol Use: 2.4 oz/week    4 Standard drinks or equivalent per week     Comment: couple times a week   OB History    Gravida Para Term Preterm AB TAB SAB Ectopic Multiple Living   3 2 2             Review of Systems  A complete 10 system review of systems was obtained and all systems are negative except as noted in the HPI and PMH.    Allergies  Wellbutrin  Home Medications   Prior to Admission medications   Medication Sig Start Date End Date Taking? Authorizing Provider  bisoprolol-hydrochlorothiazide (ZIAC) 5-6.25 MG tablet TAKE ONE TABLET BY MOUTH ONCE DAILY. 02/04/15   Burnis Medin, MD  cetirizine (ZYRTEC) 10 MG tablet Take 10 mg by mouth daily.    Historical Provider, MD  methocarbamol (ROBAXIN) 500 MG tablet Take 1 tablet (500 mg total) by mouth every 6 (six) hours as needed for muscle spasms. 01/07/15   Karie Chimera, MD  Multiple Vitamins-Minerals (CENTRUM PO) Take 1  tablet by mouth daily.     Historical Provider, MD  omeprazole (PRILOSEC) 20 MG capsule Take 1 capsule (20 mg total) by mouth daily. 05/07/14   Inda Castle, MD  Oxycodone HCl 10 MG TABS Take 1 tablet (10 mg total) by mouth every 4 (four) hours as needed for severe pain. 01/07/15   Karie Chimera, MD  PROAIR HFA 108 (90 BASE) MCG/ACT inhaler INHALE TWO PUFFS INTO LUNGS EVERY 4 HOURS AS NEEDED FOR WHEEZING OR SHORTNESS OF BREATH 10/26/14   Tanda Rockers, MD  SYMBICORT 160-4.5 MCG/ACT inhaler TAKE 2 PUFFS FIRST THING IN THE MORNING AND THEN ANOTHER 2 PUFFS ABOUT 12 HOURS LATER Patient taking differently: TAKE 2 PUFFS FIRST THING IN THE MORNING AND THEN ANOTHER 2 PUFFS ABOUT 12 HOURS LATER AS NEEDED 10/26/14   Tanda Rockers, MD   Triage Vitals: BP 132/78 mmHg  Pulse 77  Temp(Src) 97.5 F (36.4 C) (Oral)  Resp 18  Ht 5\' 6"  (1.676 m)  Wt 185 lb 12.8 oz (84.278 kg)  BMI 30.00 kg/m2  SpO2 100%   Physical Exam  Constitutional: She is oriented to person, place, and time. She appears well-developed and well-nourished. No distress.  HENT:  Head: Normocephalic and atraumatic.  Eyes: EOM are normal.  Neck: Normal range of motion.  Cardiovascular: Normal rate, regular rhythm and normal heart sounds.   Pulmonary/Chest: Effort normal and breath sounds normal.  Abdominal: Soft. She exhibits no distension. There is no tenderness.  Musculoskeletal: Normal range of motion.  Healing surgical incision of lumbar spine without any secondary signs of infection  Neurological: She is alert and oriented to person, place, and time.  Skin: Skin is warm and dry.  Psychiatric: She has a normal mood and affect. Judgment normal.  Nursing note and vitals reviewed.   ED Course  Procedures (including critical care time)  DIAGNOSTIC STUDIES: Oxygen Saturation is 100% on RA, Normal by my interpretation.    COORDINATION OF CARE: 2:50 AM- Will give Percocet and Toradol. Discussed treatment plan with pt at bedside and  pt agreed to plan.     Labs Review Labs Reviewed - No data to display  Imaging Review No results found. I have personally reviewed and evaluated these images and lab results as part of my medical decision-making.   EKG Interpretation None      MDM   Final diagnoses:  None    Patient's pain treated  in the emergency department.  She'll need a collar neurosurgical office for repeat evaluation and ongoing prescriptions.  Overall well-appearing.  Normal strength in lower extremities.  No signs of infection around the surgical incision.  Doubt deep space infection at this time.  I personally performed the services described in this documentation, which was scribed in my presence. The recorded information has been reviewed and is accurate.       [name], MD 02/15/15 [phone]

## 2015-02-15 NOTE — ED Notes (Signed)
Dr. Campos at the bedside.  

## 2015-02-20 HISTORY — PX: BACK SURGERY: SHX140

## 2015-03-15 DIAGNOSIS — Z6829 Body mass index (BMI) 29.0-29.9, adult: Secondary | ICD-10-CM | POA: Diagnosis not present

## 2015-03-15 DIAGNOSIS — I10 Essential (primary) hypertension: Secondary | ICD-10-CM | POA: Diagnosis not present

## 2015-03-15 DIAGNOSIS — M4316 Spondylolisthesis, lumbar region: Secondary | ICD-10-CM | POA: Diagnosis not present

## 2015-05-09 DIAGNOSIS — Z6830 Body mass index (BMI) 30.0-30.9, adult: Secondary | ICD-10-CM | POA: Diagnosis not present

## 2015-05-09 DIAGNOSIS — I10 Essential (primary) hypertension: Secondary | ICD-10-CM | POA: Diagnosis not present

## 2015-05-09 DIAGNOSIS — M4316 Spondylolisthesis, lumbar region: Secondary | ICD-10-CM | POA: Diagnosis not present

## 2015-05-20 ENCOUNTER — Other Ambulatory Visit: Payer: Self-pay | Admitting: Internal Medicine

## 2015-05-23 NOTE — Telephone Encounter (Signed)
Sent to the pharmacy by e-scribe for 90 days.  Pt has upcoming cpx appt on 07/06/15

## 2015-05-25 DIAGNOSIS — M6281 Muscle weakness (generalized): Secondary | ICD-10-CM | POA: Diagnosis not present

## 2015-05-25 DIAGNOSIS — M4316 Spondylolisthesis, lumbar region: Secondary | ICD-10-CM | POA: Diagnosis not present

## 2015-05-25 DIAGNOSIS — M545 Low back pain: Secondary | ICD-10-CM | POA: Diagnosis not present

## 2015-06-29 ENCOUNTER — Other Ambulatory Visit (INDEPENDENT_AMBULATORY_CARE_PROVIDER_SITE_OTHER): Payer: Medicare HMO

## 2015-06-29 DIAGNOSIS — Z0000 Encounter for general adult medical examination without abnormal findings: Secondary | ICD-10-CM

## 2015-06-29 DIAGNOSIS — R7989 Other specified abnormal findings of blood chemistry: Secondary | ICD-10-CM

## 2015-06-29 LAB — CBC WITH DIFFERENTIAL/PLATELET
BASOS ABS: 0 10*3/uL (ref 0.0–0.1)
BASOS PCT: 0.3 % (ref 0.0–3.0)
EOS ABS: 0.1 10*3/uL (ref 0.0–0.7)
Eosinophils Relative: 1.3 % (ref 0.0–5.0)
HCT: 40.4 % (ref 36.0–46.0)
HEMOGLOBIN: 13.6 g/dL (ref 12.0–15.0)
LYMPHS PCT: 27.1 % (ref 12.0–46.0)
Lymphs Abs: 1.9 10*3/uL (ref 0.7–4.0)
MCHC: 33.8 g/dL (ref 30.0–36.0)
MCV: 89.3 fl (ref 78.0–100.0)
MONO ABS: 0.6 10*3/uL (ref 0.1–1.0)
Monocytes Relative: 8.1 % (ref 3.0–12.0)
Neutro Abs: 4.4 10*3/uL (ref 1.4–7.7)
Neutrophils Relative %: 63.2 % (ref 43.0–77.0)
Platelets: 224 10*3/uL (ref 150.0–400.0)
RBC: 4.52 Mil/uL (ref 3.87–5.11)
RDW: 14.1 % (ref 11.5–15.5)
WBC: 7 10*3/uL (ref 4.0–10.5)

## 2015-06-29 LAB — HEPATIC FUNCTION PANEL
ALBUMIN: 4.2 g/dL (ref 3.5–5.2)
ALK PHOS: 77 U/L (ref 39–117)
ALT: 78 U/L — ABNORMAL HIGH (ref 0–35)
AST: 52 U/L — ABNORMAL HIGH (ref 0–37)
Bilirubin, Direct: 0.1 mg/dL (ref 0.0–0.3)
TOTAL PROTEIN: 7.4 g/dL (ref 6.0–8.3)
Total Bilirubin: 0.6 mg/dL (ref 0.2–1.2)

## 2015-06-29 LAB — TSH: TSH: 1.17 u[IU]/mL (ref 0.35–4.50)

## 2015-06-29 LAB — LIPID PANEL
CHOL/HDL RATIO: 7
CHOLESTEROL: 296 mg/dL — AB (ref 0–200)
HDL: 43 mg/dL (ref >39.00)
NonHDL: 252.52
TRIGLYCERIDES: 236 mg/dL — AB (ref 0.0–149.0)
VLDL: 47.2 mg/dL — ABNORMAL HIGH (ref 0.0–40.0)

## 2015-06-29 LAB — BASIC METABOLIC PANEL
BUN: 18 mg/dL (ref 6–23)
CALCIUM: 9.5 mg/dL (ref 8.4–10.5)
CO2: 29 mEq/L (ref 19–32)
CREATININE: 0.78 mg/dL (ref 0.40–1.20)
Chloride: 99 mEq/L (ref 96–112)
GFR: 80.18 mL/min (ref >60.00)
Glucose, Bld: 103 mg/dL — ABNORMAL HIGH (ref 70–99)
Potassium: 4.4 mEq/L (ref 3.5–5.1)
SODIUM: 138 meq/L (ref 135–145)

## 2015-06-29 LAB — LDL CHOLESTEROL, DIRECT: LDL DIRECT: 213 mg/dL

## 2015-07-05 NOTE — Progress Notes (Signed)
Chief Complaint  Patient presents with  . Annual Exam    HPI: Patient  Samantha Hinton  60 y.o. 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 Hepatitis C Screening  Apr 18, 1955  . PAP SMEAR  02/20/2007  . COLONOSCOPY  02/20/2011  . MAMMOGRAM  07/02/2015  . INFLUENZA VACCINE  09/20/2015  . TETANUS/TDAP  05/23/2024  . HIV Screening  Completed   Health Maintenance Review LIFESTYLE:  Exercise:  Walking every day  Tobacco/ETS:n Alcohol:2 per  week  Sugar beverages:no sodas Sleep:6-8 hours  Drug use: no hh of 2 2 pupps  Medicare   Document to scan   HO on advance directives  ROS:  GEN/ HEENT: No fever, significant weight changes sweats headaches vision problems hearing changes, CV/ PULM; No chest pain shortness of breath cough, syncope,edema  change in exercise tolerance. GI /GU: No adominal pain, vomiting, change in bowel habits. No blood in the stool. No significant GU symptoms. SKIN/HEME: ,no acute skin rashes suspicious lesions or bleeding. No lymphadenopathy, nodules, masses.  NEURO/ PSYCH:  No neurologic signs such as weakness numbness. No depression anxiety. IMM/ Allergy: No unusual infections.  Allergy .   REST of 12 system review negative except as per HPI   Past Medical History  Diagnosis Date  . Genital warts   . Hypertension     takes Ziac daily  . COPD (chronic obstructive pulmonary disease) (HCC)     Symbicort daily and ProAir as needed  . Emphysema   . Emphysema of lung ([facility])   . History of colon polyps   . PONV (postoperative nausea and vomiting)   . Hyperlipidemia     has been off of meds d/t getting sick from them.Not been addressed again.   Marland Kitchen Shortness of breath dyspnea     thinks d/t pain meds and notices with exertion  . Pneumonia     hx of-8+yrs ago  . History of bronchitis 01/2014  . Stroke ([facility])     16 yrs ago--left eye   . Fibromyalgia   . Chronic back pain     buldging disc and tumor.Spondylolisthesis  . GERD (gastroesophageal reflux disease)     takes Omeprazole daily  . Nocturia     Past Surgical History  Procedure Laterality Date  . Cholecystectomy  1983  . Neck surgery  [phone]    c spine ant and post hx fusion and removal or harcdware and shavings  . Appendectomy  1967  . Tonsillectomy    . Anterior (cystocele) and posterior repair (rectocele) with xenform graft and sacrospinous fixation    . Abdominal hysterectomy  1983    partial   . Breast surgery Right     diseased milk glands  . Adenoidectomy    . Diagnostic laparoscopy      cyst removed from ovaries   . Colonoscopy    . Esophagogastroduodenoscopy    . Rods and screws in lumbar      Family History  Problem Relation Age of Onset  .  CAD Father     tumor between heart and lung   . CAD Sister     ? dx   . Hypertension Father   . Hypertension Mother   . Lung cancer Mother     died 21   . Diabetes Maternal Grandmother     late 40s     Social History   Social History  . Marital Status: Widowed    Spouse Name: N/A  . Number of Children: 2  . Years of Education: N/A   Occupational History  . retired    Social History Main Topics  . Smoking status: Former Smoker -- 0.25 packs/day for 44 years    Types: Cigarettes    Quit date: 02/04/2014  . Smokeless tobacco: Never Used     Comment: quit smoking in Dec 2015  . Alcohol Use: 2.4 oz/week    4 Standard drinks or equivalent per week     Comment: couple times a week  . Drug Use: No  . Sexual Activity: Not Asked   Other Topics Concern  . None   Social History Narrative   6-8 hours of sleep per  night   Lives with her fiance hh of 2 no pets    1 dog in the home   Retired no ets  But tob 8 per day   etoh ocass 1-2    On disability from her neck surgery predicaments .   orig from [employer] in [place].    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 cetirizine (ZYRTEC) 10 MG tablet Take 10 mg by mouth daily.    . Multiple Vitamins-Minerals (CENTRUM PO) Take 1 tablet by mouth daily.     . bisoprolol-hydrochlorothiazide (ZIAC) 5-6.25 MG tablet TAKE ONE TABLET BY MOUTH ONCE DAILY 90 tablet 0  . methocarbamol (ROBAXIN) 500 MG tablet Take 1 tablet (500 mg total) by mouth every 6 (six) hours as needed for muscle spasms. 45 tablet 1  . omeprazole (PRILOSEC) 20 MG capsule Take 1 capsule (20 mg total) by mouth daily. (Patient not taking: Reported on 07/06/2015) 30 capsule 3  . Oxycodone HCl 10 MG TABS Take 1 tablet (10 mg total) by mouth every 4 (four) hours as needed for severe pain. 50 tablet 0  . PROAIR HFA 108 (90 BASE) MCG/ACT inhaler INHALE TWO PUFFS INTO LUNGS EVERY 4 HOURS AS NEEDED FOR WHEEZING OR SHORTNESS OF BREATH 1 Inhaler 0  . SYMBICORT 160-4.5 MCG/ACT inhaler TAKE 2 PUFFS FIRST THING IN THE MORNING AND THEN ANOTHER 2 PUFFS ABOUT 12 HOURS LATER (Patient taking differently: TAKE 2 PUFFS FIRST THING IN THE MORNING AND THEN ANOTHER 2 PUFFS ABOUT 12 HOURS LATER AS NEEDED) 1 Inhaler 5   No facility-administered medications prior to visit.     EXAM:  BP 128/76 mmHg  Pulse 95  Temp(Src) 97.8 F (36.6 C) (Oral)  Ht _0  (1.676 m)  Wt 192 lb (87.091 kg)  BMI 31.00 kg/m2  SpO2 98%  Body mass index is 31 kg/(m^2).  Physical Exam: Vital signs reviewed VEL:FYBO is a well-developed well-nourished alert cooperative    who appearsr stated age in no acute distress.  HEENT: normocephalic atraumatic , Eyes: PERRL EOM's  full, conjunctiva clear, Nares: paten,t no deformity discharge or tenderness., Ears:  no deformity EAC's clear TMs with normal landmarks. Mouth: clear OP, no lesions, edema.  Moist mucous membranes. Dentition in adequate repair. NECK: supple without masses, thyromegaly or bruits. CHEST/PULM:  Clear to auscultation and percussion breath sounds equal no wheeze , rales or rhonchi. No chest wall deformities or tenderness. CV: PMI is nondisplaced, S1 S2 no gallops, murmurs, rubs. Peripheral pulses are full without delay.No JVD . Breast: normal by inspection . No dimpling, discharge, masses, tenderness or discharge . ABDOMEN: Bowel sounds normal nontender  No guard or rebound, no hepato splenomegal no CVA tenderness.  No hernia. Extremtities:  No clubbing cyanosis or edema, no acute joint swelling or redness no focal atrophy NEURO:  Oriented x3, cranial nerves 3-12 appear to be intact, no obvious focal weakness,gait within normal limitsSKIN: No acute rashes normal turgor, color, no bruising or petechiae. PSYCH: Oriented, good eye contact, no obvious depression anxiety, cognition and judgment appear normal. LN: no cervical axillary inguinal adenopathy  Lab Results  Component Value Date   WBC 7.0 06/29/2015   HGB 13.6 06/29/2015   HCT 40.4 06/29/2015   PLT 224.0 06/29/2015   GLUCOSE 103* 06/29/2015   CHOL 296* 06/29/2015   TRIG 236.0* 06/29/2015   HDL 43.00 06/29/2015   LDLDIRECT 213.0 06/29/2015   LDLCALC 116* 05/24/2014   ALT 78* 06/29/2015   AST 52* 06/29/2015   NA 138 06/29/2015   K 4.4 06/29/2015   CL 99 06/29/2015   CREATININE 0.78 06/29/2015   BUN 18 06/29/2015   CO2 29 06/29/2015   TSH 1.17 06/29/2015   BP Readings from Last 3 Encounters:  07/06/15 128/76  02/15/15 142/92  01/07/15 125/69   Wt Readings from Last 3 Encounters:  07/06/15 192 lb (87.091 kg)  02/14/15 185 lb 12.8 oz (84.278 kg)  12/27/14 194 lb 9.6 oz (88.27 kg)    ASSESSMENT AND PLAN:  Discussed the following  assessment and plan:  Visit for preventive health examination  Medication management  Hyperlipidemia  COPD GOLD II  Abnormal LFTs - Plan: Hepatic function panel, Hepatitis B surface antigen, Hepatitis B surface antibody, Hepatitis B core antibody, total, Hepatitis C antibody, Ferritin, IBC panel  Essential hypertension  Nausea without vomiting poss barrets - Plan: omeprazole (PRILOSEC) 20 MG capsule, DISCONTINUED: omeprazole (PRILOSEC) 20 MG capsule Reviewed meds haelth care parameters   Pt aware  Patient Care Team: Burnis Medin, MD as PCP - General (Internal Medicine) Tanda Rockers, MD as Consulting Physician (Pulmonary Disease) Inda Castle, MD as Consulting Physician (Gastroenterology) Karie Chimera, MD as Consulting Physician (Neurosurgery) Patient Instructions  Will fille the bp med and then [facility] for now  You may need to see a different gi i nfuture  For monitoring poss  Barrerts.   Continue lifestyle intervention healthy eating  Mediterranean diet .  Plan recheck labs lfts etc iun about a month  Limit the alcohol and nambutone as possible  Then we may add lipid  medication and fu    Health Maintenance, Female Adopting a healthy lifestyle and getting preventive care can go a long way to promote health and wellness. Talk with your health care provider about what schedule of regular examinations is right for you. This is a good chance for you to check in with your provider about disease prevention and staying healthy. In between checkups, there are plenty of things you can do on your own. Experts have done a lot of research about which lifestyle changes and preventive measures are most likely to keep  you healthy. Ask your health care provider for more information. WEIGHT AND DIET  Eat a healthy diet  Be sure to include plenty of vegetables, fruits, low-fat dairy products, and lean protein.  Do not eat a lot of foods high in solid fats, added sugars, or salt.  Get  regular exercise. This is one of the most important things you can do for your health.  Most adults should exercise for at least 150 minutes each week. The exercise should increase your heart rate and make you sweat (moderate-intensity exercise).  Most adults should also do strengthening exercises at least twice a week. This is in addition to the moderate-intensity exercise.  Maintain a healthy weight  Body mass index (BMI) is a measurement that can be used to identify possible weight problems. It estimates body fat based on height and weight. Your health care provider can help determine your BMI and help you achieve or maintain a healthy weight.  For females 72 years of age and older:   A BMI below 18.5 is considered underweight.  A BMI of 18.5 to 24.9 is normal.  A BMI of 25 to 29.9 is considered overweight.  A BMI of 30 and above is considered obese.  Watch levels of cholesterol and blood lipids  You should start having your blood tested for lipids and cholesterol at 60 years of age, then have this test every 5 years.  You may need to have your cholesterol levels checked more often if:  Your lipid or cholesterol levels are high.  You are older than 60 years of age.  You are at high risk for heart disease.  CANCER SCREENING   Lung Cancer  Lung cancer screening is recommended for adults 80-60 years old who are at high risk for lung cancer because of a history of smoking.  A yearly low-dose CT scan of the lungs is recommended for people who:  Currently smoke.  Have quit within the past 15 years.  Have at least a 30-pack-year history of smoking. A pack year is smoking an average of one pack of cigarettes a day for 1 year.  Yearly screening should continue until it has been 15 years since you quit.  Yearly screening should stop if you develop a health problem that would prevent you from having lung cancer treatment.  Breast Cancer  Practice breast self-awareness.  This means understanding how your breasts normally appear and feel.  It also means doing regular breast self-exams. Let your health care provider know about any changes, no matter how small.  If you are in your 20s or 30s, you should have a clinical breast exam (CBE) by a health care provider every 1-3 years as part of a regular health exam.  If you are 87 or older, have a CBE every year. Also consider having a breast X-ray (mammogram) every year.  If you have a family history of breast cancer, talk to your health care provider about genetic screening.  If you are at high risk for breast cancer, talk to your health care provider about having an MRI and a mammogram every year.  Breast cancer gene (BRCA) assessment is recommended for women who have family members with BRCA-related cancers. BRCA-related cancers include:  Breast.  Ovarian.  Tubal.  Peritoneal cancers.  Results of the assessment will determine the need for genetic counseling and BRCA1 and BRCA2 testing. Cervical Cancer Your health care provider may recommend that you be screened regularly for cancer of the pelvic organs (ovaries,  uterus, and vagina). This screening involves a pelvic examination, including checking for microscopic changes to the surface of your cervix (Pap test). You may be encouraged to have this screening done every 3 years, beginning at age 4.  For women ages 62-65, health care providers may recommend pelvic exams and Pap testing every 3 years, or they may recommend the Pap and pelvic exam, combined with testing for human papilloma virus (HPV), every 5 years. Some types of HPV increase your risk of cervical cancer. Testing for HPV may also be done on women of any age with unclear Pap test results.  Other health care providers may not recommend any screening for nonpregnant women who are considered low risk for pelvic cancer and who do not have symptoms. Ask your health care provider if a screening pelvic  exam is right for you.  If you have had past treatment for cervical cancer or a condition that could lead to cancer, you need Pap tests and screening for cancer for at least 20 years after your treatment. If Pap tests have been discontinued, your risk factors (such as having a new sexual partner) need to be reassessed to determine if screening should resume. Some women have medical problems that increase the chance of getting cervical cancer. In these cases, your health care provider may recommend more frequent screening and Pap tests. Colorectal Cancer  This type of cancer can be detected and often prevented.  Routine colorectal cancer screening usually begins at 60 years of age and continues through 60 years of age.  Your health care provider may recommend screening at an earlier age if you have risk factors for colon cancer.  Your health care provider may also recommend using home test kits to check for hidden blood in the stool.  A small camera at the end of a tube can be used to examine your colon directly (sigmoidoscopy or colonoscopy). This is done to check for the earliest forms of colorectal cancer.  Routine screening usually begins at age 42.  Direct examination of the colon should be repeated every 5-10 years through 60 years of age. However, you may need to be screened more often if early forms of precancerous polyps or small growths are found. Skin Cancer  Check your skin from head to toe regularly.  Tell your health care provider about any new moles or changes in moles, especially if there is a change in a mole's shape or color.  Also tell your health care provider if you have a mole that is larger than the size of a pencil eraser.  Always use sunscreen. Apply sunscreen liberally and repeatedly throughout the day.  Protect yourself by wearing long sleeves, pants, a wide-brimmed hat, and sunglasses whenever you are outside. HEART DISEASE, DIABETES, AND HIGH BLOOD PRESSURE     High blood pressure causes heart disease and increases the risk of stroke. High blood pressure is more likely to develop in:  People who have blood pressure in the high end of the normal range (130-139/85-89 mm Hg).  People who are overweight or obese.  People who are African American.  If you are 84-64 years of age, have your blood pressure checked every 3-5 years. If you are 75 years of age or older, have your blood pressure checked every year. You should have your blood pressure measured twice--once when you are at a hospital or clinic, and once when you are not at a hospital or clinic. Record the average of the two measurements. To  check your blood pressure when you are not at a hospital or clinic, you can use:  An automated blood pressure machine at a pharmacy.  A home blood pressure monitor.  If you are between 3 years and 35 years old, ask your health care provider if you should take aspirin to prevent strokes.  Have regular diabetes screenings. This involves taking a blood sample to check your fasting blood sugar level.  If you are at a normal weight and have a low risk for diabetes, have this test once every three years after 60 years of age.  If you are overweight and have a high risk for diabetes, consider being tested at a younger age or more often. PREVENTING INFECTION  Hepatitis B  If you have a higher risk for hepatitis B, you should be screened for this virus. You are considered at high risk for hepatitis B if:  You were born in a country where hepatitis B is common. Ask your health care provider which countries are considered high risk.  Your parents were born in a high-risk country, and you have not been immunized against hepatitis B (hepatitis B vaccine).  You have HIV or AIDS.  You use needles to inject street drugs.  You live with someone who has hepatitis B.  You have had sex with someone who has hepatitis B.  You get hemodialysis treatment.  You  take certain medicines for conditions, including cancer, organ transplantation, and autoimmune conditions. Hepatitis C  Blood testing is recommended for:  Everyone born from [birth year] through 1965.  Anyone with known risk factors for hepatitis C. Sexually transmitted infections (STIs)  You should be screened for sexually transmitted infections (STIs) including gonorrhea and chlamydia if:  You are sexually active and are younger than 60 years of age.  You are older than 60 years of age and your health care provider tells you that you are at risk for this type of infection.  Your sexual activity has changed since you were last screened and you are at an increased risk for chlamydia or gonorrhea. Ask your health care provider if you are at risk.  If you do not have HIV, but are at risk, it may be recommended that you take a prescription medicine daily to prevent HIV infection. This is called pre-exposure prophylaxis (PrEP). You are considered at risk if:  You are sexually active and do not regularly use condoms or know the HIV status of your partner(s).  You take drugs by injection.  You are sexually active with a partner who has HIV. Talk with your health care provider about whether you are at high risk of being infected with HIV. If you choose to begin PrEP, you should first be tested for HIV. You should then be tested every 3 months for as long as you are taking PrEP.  PREGNANCY   If you are premenopausal and you may become pregnant, ask your health care provider about preconception counseling.  If you may become pregnant, take 400 to 800 micrograms (mcg) of folic acid every day.  If you want to prevent pregnancy, talk to your health care provider about birth control (contraception). OSTEOPOROSIS AND MENOPAUSE   Osteoporosis is a disease in which the bones lose minerals and strength with aging. This can result in serious bone fractures. Your risk for osteoporosis can be identified using  a bone density scan.  If you are 79 years of age or older, or if you are at risk for osteoporosis and  fractures, ask your health care provider if you should be screened.  Ask your health care provider whether you should take a calcium or vitamin D supplement to lower your risk for osteoporosis.  Menopause may have certain physical symptoms and risks.  Hormone replacement therapy may reduce some of these symptoms and risks. Talk to your health care provider about whether hormone replacement therapy is right for you.  HOME CARE INSTRUCTIONS   Schedule regular health, dental, and eye exams.  Stay current with your immunizations.   Do not use any tobacco products including cigarettes, chewing tobacco, or electronic cigarettes.  If you are pregnant, do not drink alcohol.  If you are breastfeeding, limit how much and how often you drink alcohol.  Limit alcohol intake to no more than 1 drink per day for nonpregnant women. One drink equals 12 ounces of beer, 5 ounces of wine, or 1 ounces of hard liquor.  Do not use street drugs.  Do not share needles.  Ask your health care provider for help if you need support or information about quitting drugs.  Tell your health care provider if you often feel depressed.  Tell your health care provider if you have ever been abused or do not feel safe at home.   This information is not intended to replace advice given to you by your health care provider. Make sure you discuss any questions you have with your health care provider.   Document Released: 08/21/2010 Document Revised: 02/26/2014 Document Reviewed: 01/07/2013 Elsevier Interactive Patient Education 2016 [place]. Ashlee Bewley M.D.

## 2015-07-06 ENCOUNTER — Encounter: Payer: Self-pay | Admitting: Internal Medicine

## 2015-07-06 ENCOUNTER — Ambulatory Visit (INDEPENDENT_AMBULATORY_CARE_PROVIDER_SITE_OTHER): Payer: Medicare HMO | Admitting: Internal Medicine

## 2015-07-06 VITALS — BP 128/76 | HR 95 | Temp 97.8°F | Ht 66.0 in | Wt 192.0 lb

## 2015-07-06 DIAGNOSIS — I10 Essential (primary) hypertension: Secondary | ICD-10-CM

## 2015-07-06 DIAGNOSIS — R945 Abnormal results of liver function studies: Secondary | ICD-10-CM

## 2015-07-06 DIAGNOSIS — R110 Nausea: Secondary | ICD-10-CM

## 2015-07-06 DIAGNOSIS — R7989 Other specified abnormal findings of blood chemistry: Secondary | ICD-10-CM

## 2015-07-06 DIAGNOSIS — Z79899 Other long term (current) drug therapy: Secondary | ICD-10-CM

## 2015-07-06 DIAGNOSIS — J449 Chronic obstructive pulmonary disease, unspecified: Secondary | ICD-10-CM

## 2015-07-06 DIAGNOSIS — Z0000 Encounter for general adult medical examination without abnormal findings: Secondary | ICD-10-CM | POA: Diagnosis not present

## 2015-07-06 DIAGNOSIS — E785 Hyperlipidemia, unspecified: Secondary | ICD-10-CM | POA: Diagnosis not present

## 2015-07-06 MED ORDER — BISOPROLOL-HYDROCHLOROTHIAZIDE 5-6.25 MG PO TABS: 1.0000 | ORAL_TABLET | Freq: Every day | ORAL | Status: DC

## 2015-07-06 MED ORDER — OMEPRAZOLE 20 MG PO CPDR: 20.0000 mg | DELAYED_RELEASE_CAPSULE | Freq: Every day | ORAL | Status: DC

## 2015-07-06 NOTE — Patient Instructions (Signed)
Will fille the bp med and then omeprzole for now  You may need to see a different gi i nfuture  For monitoring poss  Barrerts.   Continue lifestyle intervention healthy eating  Mediterranean diet .  Plan recheck labs lfts etc iun about a month  Limit the alcohol and nambutone as possible  Then we may add lipid  medication and fu    Health Maintenance, Female Adopting a healthy lifestyle and getting preventive care can go a long way to promote health and wellness. Talk with your health care provider about what schedule of regular examinations is right for you. This is a good chance for you to check in with your provider about disease prevention and staying healthy. In between checkups, there are plenty of things you can do on your own. Experts have done a lot of research about which lifestyle changes and preventive measures are most likely to keep you healthy. Ask your health care provider for more information. WEIGHT AND DIET  Eat a healthy diet  Be sure to include plenty of vegetables, fruits, low-fat dairy products, and lean protein.  Do not eat a lot of foods high in solid fats, added sugars, or salt.  Get regular exercise. This is one of the most important things you can do for your health.  Most adults should exercise for at least 150 minutes each week. The exercise should increase your heart rate and make you sweat (moderate-intensity exercise).  Most adults should also do strengthening exercises at least twice a week. This is in addition to the moderate-intensity exercise.  Maintain a healthy weight  Body mass index (BMI) is a measurement that can be used to identify possible weight problems. It estimates body fat based on height and weight. Your health care provider can help determine your BMI and help you achieve or maintain a healthy weight.  For females 47 years of age and older:   A BMI below 18.5 is considered underweight.  A BMI of 18.5 to 24.9 is normal.  A BMI of 25  to 29.9 is considered overweight.  A BMI of 30 and above is considered obese.  Watch levels of cholesterol and blood lipids  You should start having your blood tested for lipids and cholesterol at 60 years of age, then have this test every 5 years.  You may need to have your cholesterol levels checked more often if:  Your lipid or cholesterol levels are high.  You are older than 60 years of age.  You are at high risk for heart disease.  CANCER SCREENING   Lung Cancer  Lung cancer screening is recommended for adults 57-53 years old who are at high risk for lung cancer because of a history of smoking.  A yearly low-dose CT scan of the lungs is recommended for people who:  Currently smoke.  Have quit within the past 15 years.  Have at least a 30-pack-year history of smoking. A pack year is smoking an average of one pack of cigarettes a day for 1 year.  Yearly screening should continue until it has been 15 years since you quit.  Yearly screening should stop if you develop a health problem that would prevent you from having lung cancer treatment.  Breast Cancer  Practice breast self-awareness. This means understanding how your breasts normally appear and feel.  It also means doing regular breast self-exams. Let your health care provider know about any changes, no matter how small.  If you are in your  17s or 77s, you should have a clinical breast exam (CBE) by a health care provider every 1-3 years as part of a regular health exam.  If you are 46 or older, have a CBE every year. Also consider having a breast X-ray (mammogram) every year.  If you have a family history of breast cancer, talk to your health care provider about genetic screening.  If you are at high risk for breast cancer, talk to your health care provider about having an MRI and a mammogram every year.  Breast cancer gene (BRCA) assessment is recommended for women who have family members with BRCA-related  cancers. BRCA-related cancers include:  Breast.  Ovarian.  Tubal.  Peritoneal cancers.  Results of the assessment will determine the need for genetic counseling and BRCA1 and BRCA2 testing. Cervical Cancer Your health care provider may recommend that you be screened regularly for cancer of the pelvic organs (ovaries, uterus, and vagina). This screening involves a pelvic examination, including checking for microscopic changes to the surface of your cervix (Pap test). You may be encouraged to have this screening done every 3 years, beginning at age 72.  For women ages 1-65, health care providers may recommend pelvic exams and Pap testing every 3 years, or they may recommend the Pap and pelvic exam, combined with testing for human papilloma virus (HPV), every 5 years. Some types of HPV increase your risk of cervical cancer. Testing for HPV may also be done on women of any age with unclear Pap test results.  Other health care providers may not recommend any screening for nonpregnant women who are considered low risk for pelvic cancer and who do not have symptoms. Ask your health care provider if a screening pelvic exam is right for you.  If you have had past treatment for cervical cancer or a condition that could lead to cancer, you need Pap tests and screening for cancer for at least 20 years after your treatment. If Pap tests have been discontinued, your risk factors (such as having a new sexual partner) need to be reassessed to determine if screening should resume. Some women have medical problems that increase the chance of getting cervical cancer. In these cases, your health care provider may recommend more frequent screening and Pap tests. Colorectal Cancer  This type of cancer can be detected and often prevented.  Routine colorectal cancer screening usually begins at 60 years of age and continues through 60 years of age.  Your health care provider may recommend screening at an earlier  age if you have risk factors for colon cancer.  Your health care provider may also recommend using home test kits to check for hidden blood in the stool.  A small camera at the end of a tube can be used to examine your colon directly (sigmoidoscopy or colonoscopy). This is done to check for the earliest forms of colorectal cancer.  Routine screening usually begins at age 28.  Direct examination of the colon should be repeated every 5-10 years through 60 years of age. However, you may need to be screened more often if early forms of precancerous polyps or small growths are found. Skin Cancer  Check your skin from head to toe regularly.  Tell your health care provider about any new moles or changes in moles, especially if there is a change in a mole's shape or color.  Also tell your health care provider if you have a mole that is larger than the size of a pencil eraser.  Always use sunscreen. Apply sunscreen liberally and repeatedly throughout the day.  Protect yourself by wearing long sleeves, pants, a wide-brimmed hat, and sunglasses whenever you are outside. HEART DISEASE, DIABETES, AND HIGH BLOOD PRESSURE   High blood pressure causes heart disease and increases the risk of stroke. High blood pressure is more likely to develop in:  People who have blood pressure in the high end of the normal range (130-139/85-89 mm Hg).  People who are overweight or obese.  People who are African American.  If you are 83-72 years of age, have your blood pressure checked every 3-5 years. If you are 104 years of age or older, have your blood pressure checked every year. You should have your blood pressure measured twice--once when you are at a hospital or clinic, and once when you are not at a hospital or clinic. Record the average of the two measurements. To check your blood pressure when you are not at a hospital or clinic, you can use:  An automated blood pressure machine at a pharmacy.  A home  blood pressure monitor.  If you are between 77 years and 86 years old, ask your health care provider if you should take aspirin to prevent strokes.  Have regular diabetes screenings. This involves taking a blood sample to check your fasting blood sugar level.  If you are at a normal weight and have a low risk for diabetes, have this test once every three years after 60 years of age.  If you are overweight and have a high risk for diabetes, consider being tested at a younger age or more often. PREVENTING INFECTION  Hepatitis B  If you have a higher risk for hepatitis B, you should be screened for this virus. You are considered at high risk for hepatitis B if:  You were born in a country where hepatitis B is common. Ask your health care provider which countries are considered high risk.  Your parents were born in a high-risk country, and you have not been immunized against hepatitis B (hepatitis B vaccine).  You have HIV or AIDS.  You use needles to inject street drugs.  You live with someone who has hepatitis B.  You have had sex with someone who has hepatitis B.  You get hemodialysis treatment.  You take certain medicines for conditions, including cancer, organ transplantation, and autoimmune conditions. Hepatitis C  Blood testing is recommended for:  Everyone born from [birth year] through 1965.  Anyone with known risk factors for hepatitis C. Sexually transmitted infections (STIs)  You should be screened for sexually transmitted infections (STIs) including gonorrhea and chlamydia if:  You are sexually active and are younger than 60 years of age.  You are older than 60 years of age and your health care provider tells you that you are at risk for this type of infection.  Your sexual activity has changed since you were last screened and you are at an increased risk for chlamydia or gonorrhea. Ask your health care provider if you are at risk.  If you do not have HIV, but are at  risk, it may be recommended that you take a prescription medicine daily to prevent HIV infection. This is called pre-exposure prophylaxis (PrEP). You are considered at risk if:  You are sexually active and do not regularly use condoms or know the HIV status of your partner(s).  You take drugs by injection.  You are sexually active with a partner who has HIV. Talk with your health  care provider about whether you are at high risk of being infected with HIV. If you choose to begin PrEP, you should first be tested for HIV. You should then be tested every 3 months for as long as you are taking PrEP.  PREGNANCY   If you are premenopausal and you may become pregnant, ask your health care provider about preconception counseling.  If you may become pregnant, take 400 to 800 micrograms (mcg) of folic acid every day.  If you want to prevent pregnancy, talk to your health care provider about birth control (contraception). OSTEOPOROSIS AND MENOPAUSE   Osteoporosis is a disease in which the bones lose minerals and strength with aging. This can result in serious bone fractures. Your risk for osteoporosis can be identified using a bone density scan.  If you are 4 years of age or older, or if you are at risk for osteoporosis and fractures, ask your health care provider if you should be screened.  Ask your health care provider whether you should take a calcium or vitamin D supplement to lower your risk for osteoporosis.  Menopause may have certain physical symptoms and risks.  Hormone replacement therapy may reduce some of these symptoms and risks. Talk to your health care provider about whether hormone replacement therapy is right for you.  HOME CARE INSTRUCTIONS   Schedule regular health, dental, and eye exams.  Stay current with your immunizations.   Do not use any tobacco products including cigarettes, chewing tobacco, or electronic cigarettes.  If you are pregnant, do not drink alcohol.  If  you are breastfeeding, limit how much and how often you drink alcohol.  Limit alcohol intake to no more than 1 drink per day for nonpregnant women. One drink equals 12 ounces of beer, 5 ounces of wine, or 1 ounces of hard liquor.  Do not use street drugs.  Do not share needles.  Ask your health care provider for help if you need support or information about quitting drugs.  Tell your health care provider if you often feel depressed.  Tell your health care provider if you have ever been abused or do not feel safe at home.   This information is not intended to replace advice given to you by your health care provider. Make sure you discuss any questions you have with your health care provider.   Document Released: 08/21/2010 Document Revised: 02/26/2014 Document Reviewed: 01/07/2013 Elsevier Interactive Patient Education [employer].

## 2015-07-07 DIAGNOSIS — R945 Abnormal results of liver function studies: Secondary | ICD-10-CM | POA: Insufficient documentation

## 2015-07-07 DIAGNOSIS — R7989 Other specified abnormal findings of blood chemistry: Secondary | ICD-10-CM | POA: Insufficient documentation

## 2015-07-07 NOTE — Assessment & Plan Note (Signed)
Poss fatty liver  Lipids very high  Although has been on nsaids and other meds  From her back predicament but off now.  Recheck in a month with serology ferritin and depending on that fu .

## 2015-07-07 NOTE — Assessment & Plan Note (Signed)
ldl now over 200!  jsut began eating healthy for last weeks  Says had se of lovastatin in past .  With abn lfts planrecheck and then consider starting a different statin med

## 2015-08-04 ENCOUNTER — Other Ambulatory Visit: Payer: Medicare HMO

## 2015-09-26 ENCOUNTER — Encounter (HOSPITAL_COMMUNITY): Payer: Self-pay | Admitting: Emergency Medicine

## 2015-09-26 ENCOUNTER — Ambulatory Visit (HOSPITAL_COMMUNITY)
Admission: EM | Admit: 2015-09-26 | Discharge: 2015-09-26 | Disposition: A | Discharge status: Home / Self Care | Payer: Medicare HMO | Attending: Emergency Medicine | Admitting: Emergency Medicine

## 2015-09-26 ENCOUNTER — Ambulatory Visit (INDEPENDENT_AMBULATORY_CARE_PROVIDER_SITE_OTHER): Payer: Medicare HMO

## 2015-09-26 DIAGNOSIS — S93401A Sprain of unspecified ligament of right ankle, initial encounter: Secondary | ICD-10-CM | POA: Diagnosis not present

## 2015-09-26 DIAGNOSIS — M7989 Other specified soft tissue disorders: Secondary | ICD-10-CM | POA: Diagnosis not present

## 2015-09-26 MED ORDER — TRAMADOL HCL 50 MG PO TABS: 50.0000 mg | ORAL_TABLET | Freq: Four times a day (QID) | ORAL | 0 refills | Status: DC | PRN

## 2015-09-26 MED ORDER — MELOXICAM 7.5 MG PO TABS: 7.5000 mg | ORAL_TABLET | Freq: Every day | ORAL | 0 refills | Status: DC

## 2015-09-26 NOTE — Discharge Instructions (Signed)
Your x-ray is normal. You have a nasty ankle sprain. Wear the brace for the next week. Keep your foot elevated and apply ice as often as you can to help with the swelling. Take meloxicam daily for the next week to help with the inflammation. Use the tramadol every 6-8 hours as needed for severe pain. If not improving over the next week, please follow-up with your primary care doctor.

## 2015-09-26 NOTE — ED Triage Notes (Signed)
The patient presented to the [facility] with a complaint of right ankle pain secondary to rolling her ankle over while walking.

## 2015-09-26 NOTE — ED Provider Notes (Signed)
[facility]    CSN: [id number] Arrival date & time: 09/26/15  1310  First Provider Contact:  First MD Initiated Contact with Patient 09/26/15 1416        History   Chief Complaint Chief Complaint  Patient presents with  . Ankle Pain    HPI Samantha Hinton is a 60 y.o. female.   She is a 60 year old woman here for evaluation of right ankle injury. She states about a week ago she rolled it stepping off a curb. Since then, it has not been improving. It got acutely worse last night after helping a coworker over the weekend. She reports pain and swelling of both the medial and lateral ankle. She also reports pain in the posterior ankle that radiates up her leg. She is able to bear weight, but states with significant difficulty.     Past Medical History:  Diagnosis Date  . Chronic back pain    buldging disc and tumor.Spondylolisthesis  . COPD (chronic obstructive pulmonary disease) (HCC)    Symbicort daily and ProAir as needed  . Emphysema   . Emphysema of lung ([place])   . Fibromyalgia   . Genital warts   . GERD (gastroesophageal reflux disease)    takes Omeprazole daily  . History of bronchitis 01/2014  . History of colon polyps   . Hyperlipidemia    has been off of meds d/t getting sick from them.Not been addressed again.   Marland Kitchen Hypertension    takes Ziac daily  . Nocturia   . Pneumonia    hx of-8+yrs ago  . PONV (postoperative nausea and vomiting)   . Shortness of breath dyspnea    thinks d/t pain meds and notices with exertion  . 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 Abnormal LFTs 07/07/2015  . Spondylolisthesis of lumbar region 01/04/2015  . Colon cancer screening 05/07/2014  . Nausea without vomiting 03/20/2014  . Family history of lung cancer 12/01/2013  . Family history of heart disease 12/01/2013  . Hyperlipidemia 12/01/2013  . COPD with acute exacerbation ([place]) 08/28/2013  . Other and  unspecified hyperlipidemia 07/16/2013  . Bruising 07/16/2013  . Intentional underdosing of medication regimen by patient due to financial hardship 06/16/2013  . Adjustment reaction with anxiety 06/16/2013  . Adjustment disorder with anxious mood 06/16/2013  . Nausea alone 06/16/2013  . Left breast lump 06/16/2013  . H/O cervical spine surgery 06/16/2013  . COPD GOLD II 04/12/2012  . Essential hypertension, benign 04/12/2012  . SOB (shortness of breath) 02/17/2012  . Hypoxemia 02/17/2012    Past Surgical History:  Procedure Laterality Date  . ABDOMINAL HYSTERECTOMY  1983   partial   . ADENOIDECTOMY    . ANTERIOR (CYSTOCELE) AND POSTERIOR REPAIR (RECTOCELE) WITH XENFORM GRAFT AND SACROSPINOUS FIXATION    . APPENDECTOMY  1967  . BREAST SURGERY Right    diseased milk glands  . CHOLECYSTECTOMY  1983  . COLONOSCOPY    . DIAGNOSTIC LAPAROSCOPY     cyst removed from ovaries   . ESOPHAGOGASTRODUODENOSCOPY    . NECK SURGERY  [phone]   c spine ant and post hx fusion and removal or harcdware and shavings  . rods and screws in lumbar    . TONSILLECTOMY      OB History    Gravida Para Term Preterm AB Living   3 2 2  SAB TAB Ectopic Multiple Live Births                   Home Medications    Prior to Admission medications   Medication Sig Start Date End Date Taking? Authorizing Provider  bisoprolol-hydrochlorothiazide (ZIAC) 5-6.25 MG tablet Take 1 tablet by mouth daily. 07/06/15   Burnis Medin, MD  cetirizine (ZYRTEC) 10 MG tablet Take 10 mg by mouth daily.    Historical Provider, MD  cyclobenzaprine (FLEXERIL) 10 MG tablet Take 1 tablet by mouth at bedtime as needed. 06/23/15   Historical Provider, MD  meloxicam (MOBIC) 7.5 MG tablet Take 1 tablet (7.5 mg total) by mouth daily. 09/26/15   Melony Overly, MD  Multiple Vitamins-Minerals (CENTRUM PO) Take 1 tablet by mouth daily.     Historical Provider, MD  nabumetone (RELAFEN) 500 MG tablet Take 1 tablet by mouth 2 (two)  times daily. 06/23/15   Historical Provider, MD  omeprazole (PRILOSEC) 20 MG capsule Take 1 capsule (20 mg total) by mouth daily. 07/06/15   Burnis Medin, MD  traMADol (ULTRAM) 50 MG tablet Take 1 tablet (50 mg total) by mouth every 6 (six) hours as needed. 09/26/15   Melony Overly, MD    Family History Family History  Problem Relation Age of Onset  . CAD Father     tumor between heart and lung   . CAD Sister     ? dx   . Hypertension Father   . Hypertension Mother   . Lung cancer Mother     died 20   . Diabetes Maternal Grandmother     late 64s     Social History Social History  Substance Use Topics  . Smoking status: Former Smoker    Packs/day: 0.25    Years: 44.00    Types: Cigarettes    Quit date: 02/04/2014  . Smokeless tobacco: Never Used     Comment: quit smoking in Dec 2015  . Alcohol use 2.4 oz/week    4 Standard drinks or equivalent per week     Comment: couple times a week     Allergies   Wellbutrin [bupropion]   Review of Systems Review of Systems  Musculoskeletal:       Right ankle injury     Physical Exam Triage Vital Signs ED Triage Vitals [09/26/15 1414]  Enc Vitals Group     BP      Pulse      Resp      Temp      Temp src      SpO2      Weight      Height      Head Circumference      Peak Flow      Pain Score 5     Pain Loc      Pain Edu?      Excl. in [place]?    No data found.   Updated Vital Signs There were no vitals taken for this visit.  Visual Acuity Right Eye Distance:   Left Eye Distance:   Bilateral Distance:    Right Eye Near:   Left Eye Near:    Bilateral Near:     Physical Exam  Constitutional: She is oriented to person, place, and time. She appears well-developed and well-nourished. No distress.  Pulmonary/Chest: Effort normal.  Musculoskeletal:  Right ankle: No erythema or bruising. She does have swelling, most notable around the lateral malleolus. No tenderness of  the medial alveolus. She is tender over the  lateral malleolus and distal to the lateral malleolus. No tenderness of the foot. No tenderness of the fibular head.  Neurological: She is alert and oriented to person, place, and time.     UC Treatments / Results  Labs (all labs ordered are listed, but only abnormal results are displayed) Labs Reviewed - No data to display  EKG  EKG Interpretation None       Radiology Dg Ankle Complete Right  Result Date: 09/26/2015 CLINICAL DATA:  Right ankle pain, swelling and bruising after a misstep and rolling the ankle approximately 1 week ago. EXAM: RIGHT ANKLE - COMPLETE 3+ VIEW COMPARISON:  None. FINDINGS: Mild to moderate diffuse soft tissue swelling. No fracture, dislocation or effusion seen. Mild posterior calcaneal spur formation. IMPRESSION: Soft tissue swelling without underlying fracture. Electronically Signed   By: Claudie Revering M.D.   On: 09/26/2015 14:37    Procedures Procedures (including critical care time)  Medications Ordered in UC Medications - No data to display   Initial Impression / Assessment and Plan / UC Course  I have reviewed the triage vital signs and the nursing notes.  Pertinent labs & imaging results that were available during my care of the patient were reviewed by me and considered in my medical decision making (see chart for details).  Clinical Course    X-rays negative for fracture. Conservative management with ASO brace, ice, elevation, and meloxicam. Prescription for tramadol provided to use for severe pain. Follow-up with PCP if not improving in the next week.  Final Clinical Impressions(s) / UC Diagnoses   Final diagnoses:  Right ankle sprain, initial encounter    New Prescriptions New Prescriptions   MELOXICAM (MOBIC) 7.5 MG TABLET    Take 1 tablet (7.5 mg total) by mouth daily.   TRAMADOL (ULTRAM) 50 MG TABLET    Take 1 tablet (50 mg total) by mouth every 6 (six) hours as needed.     Melony Overly, MD 09/26/15 [phone]

## 2015-10-12 ENCOUNTER — Telehealth: Payer: Self-pay | Admitting: Internal Medicine

## 2015-10-12 MED ORDER — BUDESONIDE-FORMOTEROL FUMARATE 160-4.5 MCG/ACT IN AERO: 2.0000 | INHALATION_SPRAY | Freq: Two times a day (BID) | RESPIRATORY_TRACT | 1 refills | Status: DC

## 2015-10-12 NOTE — Telephone Encounter (Signed)
Spoke with pt, requesting a refill on Symbicort.  I advised pt that we have not seen her in over 1 year. Pt scheduled for rov with MW after she returns from traveling.   There is no current inhaler on pt's med list.    MW ok to refill symbicort until next ov?  Thanks!     Patient Instructions   Ok to change symbicort to take  as needed Take 2 puffs first thing in am and then another 2 puffs about 12 hours later.    Only use your albuterol as a rescue medication to be used if you can't catch your breath by resting or doing a relaxed purse lip breathing pattern.  - The less you use it, the better it will work when you need it. - Ok to use up to 2 puffs  every 4 hours if you must but call for immediate appointment if use goes up over your usual need after you first restart symbicort - Don't leave home without it !!  (think of it like the spare tire for your car)     If you are satisfied with your treatment plan,  let your doctor know and he/she can either refill your medications or you can return here when your prescription runs out.      If in any way you are not 100% satisfied,  please tell us.  If 100% better, tell your friends!   Pulmonary follow up is as needed

## 2015-10-12 NOTE — Telephone Encounter (Signed)
Per chart pt was last on Symbicort 160. Spoke with pt and advised that MW approved refill pending f/u appt. Rx sent. Pt confirmed appt for 11/07/15.  Nothing further needed.

## 2015-10-12 NOTE — Telephone Encounter (Signed)
Ok to refill - if doesn't know the strength go with the 160

## 2015-10-13 ENCOUNTER — Telehealth: Payer: Self-pay | Admitting: Internal Medicine

## 2015-10-13 NOTE — Telephone Encounter (Signed)
ATC, received fast busy signal x 3. WCB

## 2015-10-14 ENCOUNTER — Telehealth: Payer: Self-pay | Admitting: Internal Medicine

## 2015-10-14 MED ORDER — MOMETASONE FURO-FORMOTEROL FUM 200-5 MCG/ACT IN AERO: 2.0000 | INHALATION_SPRAY | Freq: Two times a day (BID) | RESPIRATORY_TRACT | 6 refills | Status: DC

## 2015-10-14 NOTE — Telephone Encounter (Signed)
Called and spoke with pt and she is aware of change in meds.  Med list updated.  Nothing further is needed.

## 2015-10-14 NOTE — Telephone Encounter (Signed)
Called and spoke with pt and she stated that she went to get the symbicort from the pharmacy and this is not covered by her insurance.  She stated that she will need something else to use.  MW please advise.  Pt stated that the dulera worked good for her as well.  thanks

## 2015-10-14 NOTE — Telephone Encounter (Signed)
Called spoke with pt. She is requesting samples of symbicort. She states she is currently awaiting PA response and will be out of town for the next five days and will run out before she returns. I informed her that a sample was placed at the front office. She voiced understanding and had no further questions. Nothing further needed.

## 2015-10-14 NOTE — Telephone Encounter (Signed)
Equivalent is dulera 200 2bid fine with me

## 2015-10-17 ENCOUNTER — Telehealth: Payer: Self-pay | Admitting: Internal Medicine

## 2015-10-17 NOTE — Telephone Encounter (Signed)
Ok to rx with advair 115 but make appt to see me w/in 6 weeks to be sure this is effective  rx is Take 2 puffs first thing in am and then another 2 puffs about 12 hours later.

## 2015-10-17 NOTE — Telephone Encounter (Signed)
Called and spoke to [place] with [employer]. Lucious Groves states the [facility] is non-formulary. The covered alternatives are Advair and Breo. Pt last seen in 07/2014 by MW and advised to f/u as needed. A new Dulera rx was sent on Friday 8/25 by our office.   Dr. Melvyn Novas please advise. Thanks.     Ok to change symbicort to take  as needed Take 2 puffs first thing in am and then another 2 puffs about 12 hours later.    Only use your albuterol as a rescue medication to be used if you can't catch your breath by resting or doing a relaxed purse lip breathing pattern.  - The less you use it, the better it will work when you need it. - Ok to use up to 2 puffs  every 4 hours if you must but call for immediate appointment if use goes up over your usual need after you first restart symbicort - Don't leave home without it !!  (think of it like the spare tire for your car)     If you are satisfied with your treatment plan,  let your doctor know and he/she can either refill your medications or you can return here when your prescription runs out.      If in any way you are not 100% satisfied,  please tell us.  If 100% better, tell your friends!   Pulmonary follow up is as needed

## 2015-10-17 NOTE — Telephone Encounter (Signed)
LMTCB for pt 

## 2015-10-18 MED ORDER — FLUTICASONE-SALMETEROL 115-21 MCG/ACT IN AERO: 2.0000 | INHALATION_SPRAY | Freq: Two times a day (BID) | RESPIRATORY_TRACT | 6 refills | Status: DC

## 2015-10-18 NOTE — Telephone Encounter (Signed)
[phone] calling back ok to leave a msg

## 2015-10-18 NOTE — Telephone Encounter (Signed)
lmomtcb x2 for pt 

## 2015-10-18 NOTE — Telephone Encounter (Signed)
Called pt and lmom per pt request.  Samantha Hinton and Symbicort are non formulary.  The replacement will be Advair 115 2 puff bid per Dr Melvyn Novas.  New Rx sent to pharmacy.  Keep F/U appt with Dr Melvyn Novas.

## 2015-11-07 ENCOUNTER — Ambulatory Visit: Payer: Medicare HMO | Admitting: Internal Medicine

## 2015-11-09 ENCOUNTER — Ambulatory Visit: Payer: Medicare HMO | Admitting: Internal Medicine

## 2015-12-15 NOTE — Progress Notes (Signed)
Pre visit review using our clinic review tool, if applicable. No additional management support is needed unless otherwise documented below in the visit note.  Chief Complaint  Patient presents with  . Rt Elbow Swelling    Bumped elbow on door 3 days ago.  Was also doing lifting.  Cough and congestion ongoing for 2 weeks.  . Cough and Congestion  . Shortness of Breath    HPI: Samantha Hinton 60 y.o.  sda   See right elbow on door that slammed into it 3 days ago now having increasing pain shoots down to her wrist hard to extend her elbow didn't fall. Was trying to catch the door with her foot when it fell against the arm though  No cruslhng falling or squeezing injury.   Also is having cough and congestion for 2 weeks wheezing shortness of breath. She has a history of COPD. On controller medicines.Onset with low-grade fever 100 waxes and wanes coughing up some colored yellow phlegm with no blood. Face feels congested with sinus tenderness..Since she stopped smoking would get a problem. She is on controller inhalers. ROS: See pertinent positives and negatives per HPI. No chills numbness in her hands.  Past Medical History:  Diagnosis Date  . Chronic back pain    buldging disc and tumor.Spondylolisthesis  . COPD (chronic obstructive pulmonary disease) (HCC)    Symbicort daily and ProAir as needed  . Emphysema   . Emphysema of lung ([employer])   . Fibromyalgia   . Genital warts   . GERD (gastroesophageal reflux disease)    takes Omeprazole daily  . History of bronchitis 01/2014  . History of colon polyps   . Hyperlipidemia    has been off of meds d/t getting sick from them.Not been addressed again.   Marland Kitchen Hypertension    takes Ziac daily  . Nocturia   . Pneumonia    hx of-8+yrs ago  . PONV (postoperative nausea and vomiting)   . Shortness of breath dyspnea    thinks d/t pain meds and notices with exertion  . Stroke [facility])    16 yrs ago--left eye     Family History  Problem  Relation Age of Onset  . CAD Father     tumor between heart and lung   . CAD Sister     ? dx   . Hypertension Father   . Hypertension Mother   . Lung cancer Mother     died 69   . Diabetes Maternal Grandmother     late 78s     Social History   Social History  . Marital status: Widowed    Spouse name: N/A  . Number of children: 2  . Years of education: N/A   Occupational History  . retired Scientist, research (physical sciences)   Social History Main Topics  . Smoking status: Former Smoker    Packs/day: 0.25    Years: 44.00    Types: Cigarettes    Quit date: 02/04/2014  . Smokeless tobacco: Never Used     Comment: quit smoking in Dec 2015  . Alcohol use 2.4 oz/week    4 Standard drinks or equivalent per week     Comment: couple times a week  . Drug use: No  . Sexual activity: Not Asked   Other Topics Concern  . None   Social History Narrative   6-8 hours of sleep per night   Lives with her fiance hh of 2 no pets    1 dog in the  home   Retired no ets  But tob 8 per day   etoh ocass 1-2    On disability from her neck surgery predicaments .   orig from [employer] in [place] area.    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 2   Last Pap 2006 last mammogram 2000 and   Has dentures   FA     Outpatient Medications Prior to Visit  Medication Sig Dispense Refill  . bisoprolol-hydrochlorothiazide (ZIAC) 5-6.25 MG tablet Take 1 tablet by mouth daily. 90 tablet 3  . cetirizine (ZYRTEC) 10 MG tablet Take 10 mg by mouth daily.    . cyclobenzaprine (FLEXERIL) 10 MG tablet Take 1 tablet by mouth at bedtime as needed.    . Multiple Vitamins-Minerals (CENTRUM PO) Take 1 tablet by mouth daily.     Marland Kitchen omeprazole (PRILOSEC) 20 MG capsule Take 1 capsule (20 mg total) by mouth daily. 30 capsule 5  . fluticasone-salmeterol (ADVAIR HFA) 115-21 MCG/ACT inhaler Inhale 2 puffs into the lungs 2 (two) times daily. (Patient not taking: Reported on 12/16/2015) 1 Inhaler 6  . mometasone-formoterol  (DULERA) 200-5 MCG/ACT AERO Inhale 2 puffs into the lungs 2 (two) times daily. (Patient not taking: Reported on 12/16/2015) 1 Inhaler 6  . meloxicam (MOBIC) 7.5 MG tablet Take 1 tablet (7.5 mg total) by mouth daily. 30 tablet 0  . nabumetone (RELAFEN) 500 MG tablet Take 1 tablet by mouth 2 (two) times daily.    . traMADol (ULTRAM) 50 MG tablet Take 1 tablet (50 mg total) by mouth every 6 (six) hours as needed. (Patient not taking: Reported on 12/16/2015) 15 tablet 0   No facility-administered medications prior to visit.      EXAM:  BP (!) 164/100 (BP Location: Left Arm, Patient Position: Sitting, Cuff Size: Normal)   Pulse 92   Temp 98.6 F (37 C) (Oral)   Wt 202 lb 6.4 oz (91.8 kg)   SpO2 96%   BMI 32.67 kg/m   Body mass index is 32.67 kg/m.  GENERAL: vitals reviewed and listed above, alert, oriented, appears well hydrated and in no acute distress  Breathing rx Intermittent cough nontoxic. Holding her right arm in partial flexion right side. HEENT: atraumatic, conjunctiva  clear, no obvious abnormalities on inspection of external nose and ears  face had mild sinus tenderness. NECK: no obvious masses on inspection palpation  LUNGS: Exam after nebulizer; expiratory wheezes with adequate air movement no obvious rales or rhonchi. CV: HRRR, no clubbing cyanosis or  nl cap refill  MS: moves all extremities Right arm with contusion and bruising over the right lateral epicondyle area with no effusion in the elbow decreased range of motion from pain on the lateral elbow. No edema otherwise no warmth. Neurovascular appears to be intact. Can do rotational force. PSYCH: pleasant and cooperative, no obvious depression or anxiety  ASSESSMENT AND PLAN:  Discussed the following assessment and plan:  Contusion of right elbow, initial encounter - Plan: DG ELBOW COMPLETE RIGHT (3+VIEW), Ambulatory referral to Sports Medicine  Elbow injury, right, initial encounter - Plan: DG ELBOW COMPLETE RIGHT  (3+VIEW), Ambulatory referral to Sports Medicine  COPD exacerbation ([place]) - Plan: ipratropium-albuterol (DUONEB) 0.5-2.5 (3) MG/3ML nebulizer solution 3 mL bp up also illness pain etc   May be just a severe contusion but dec rom   And significant pain  Lateral elbow  And no fall  X ray and plan fu here or sm etc  Can add  perd dose over the weekend and  r for  Secondary infection  if  persistent or progressive plan fu with pulmonary  On new?  Controller  No obv bacterial infection  -Patient advised to return or notify health care team  if symptoms worsen ,persist or new concerns arise.  Patient Instructions  This acts like a severe contusion at elbow . Get x ray today . Cold   Ice  If x ray negative for fracture We can make appt for sports medicine for fu or( you can go tohe evening walk in clinic for muirphy wainer tonight . )  Sling   pred and antioitc for COPD flare  Cough med for comfort    Fu  Dr wert or [place] if ongoing   Coughing without  Help.     Standley Brooking. Panosh M.D.

## 2015-12-16 ENCOUNTER — Ambulatory Visit (INDEPENDENT_AMBULATORY_CARE_PROVIDER_SITE_OTHER)
Admission: RE | Admit: 2015-12-16 | Discharge: 2015-12-16 | Disposition: A | Discharge status: Home / Self Care | Payer: Medicare HMO | Source: Ambulatory Visit | Attending: Internal Medicine | Admitting: Internal Medicine

## 2015-12-16 ENCOUNTER — Encounter: Payer: Self-pay | Admitting: Internal Medicine

## 2015-12-16 ENCOUNTER — Ambulatory Visit (INDEPENDENT_AMBULATORY_CARE_PROVIDER_SITE_OTHER): Payer: Medicare HMO | Admitting: Internal Medicine

## 2015-12-16 VITALS — BP 164/100 | HR 92 | Temp 98.6°F | Wt 202.4 lb

## 2015-12-16 DIAGNOSIS — S59901A Unspecified injury of right elbow, initial encounter: Secondary | ICD-10-CM | POA: Diagnosis not present

## 2015-12-16 DIAGNOSIS — M25421 Effusion, right elbow: Secondary | ICD-10-CM | POA: Diagnosis not present

## 2015-12-16 DIAGNOSIS — S5001XA Contusion of right elbow, initial encounter: Secondary | ICD-10-CM | POA: Diagnosis not present

## 2015-12-16 DIAGNOSIS — J441 Chronic obstructive pulmonary disease with (acute) exacerbation: Secondary | ICD-10-CM

## 2015-12-16 MED ORDER — IPRATROPIUM-ALBUTEROL 0.5-2.5 (3) MG/3ML IN SOLN
3.0000 mL | Freq: Once | RESPIRATORY_TRACT | Status: AC
  Administered 2015-12-16: 3 mL via RESPIRATORY_TRACT

## 2015-12-16 MED ORDER — AZITHROMYCIN 500 MG PO TABS: 500.0000 mg | ORAL_TABLET | Freq: Every day | ORAL | 0 refills | Status: DC

## 2015-12-16 MED ORDER — PREDNISONE 20 MG PO TABS: ORAL_TABLET | ORAL | 0 refills | Status: DC

## 2015-12-16 MED ORDER — HYDROCODONE-HOMATROPINE 5-1.5 MG/5ML PO SYRP: ORAL_SOLUTION | ORAL | 0 refills | Status: DC

## 2015-12-16 NOTE — Patient Instructions (Addendum)
This acts like a severe contusion at elbow . Get x ray today . Cold   Ice  If x ray negative for fracture We can make appt for sports medicine for fu or( you can go tohe evening walk in clinic for muirphy wainer tonight . )  Sling   pred and antioitc for COPD flare  Cough med for comfort    Fu  Dr wert or [place] if ongoing   Coughing without  Help.

## 2015-12-19 DIAGNOSIS — M25521 Pain in right elbow: Secondary | ICD-10-CM | POA: Diagnosis not present

## 2016-03-12 NOTE — Progress Notes (Signed)
Pre visit review using our clinic review tool, if applicable. No additional management support is needed unless otherwise documented below in the visit note.  Chief Complaint  Patient presents with  . Urinary Urgency  . Abdominal Pain    HPI: Samantha Hinton 61 y.o.  sda  Was a 2 week history of ongoing symptoms. Initially onset of left lower quadrant low abdominal pain and then developed urinary frequency urgency but bladder dysfunction where she begins to void and it stops midstream. Feels like she has to go every 20 minutes. No fever but feels badly feels off. States her bowel function is normal for her no blood in her stool.  Urgency  And then not emptuying baldder .    Now 2-3 weeks   Feeling off .  And not getting better or worse. No new medications. Urine does have an odor to it. No fever  And pain left side   feelin g out of it.   no other changes    ROS: See pertinent positives and negatives per HPI. Back pain  No inc or increasing sx remote past history of bladder and pelvic surgery tight. Question for prolapse. No details available. Past Medical History:  Diagnosis Date  . Chronic back pain    buldging disc and tumor.Spondylolisthesis  . COPD (chronic obstructive pulmonary disease) (HCC)    Symbicort daily and ProAir as needed  . Emphysema   . Emphysema of lung ([place])   . Fibromyalgia   . Genital warts   . GERD (gastroesophageal reflux disease)    takes Omeprazole daily  . History of bronchitis 01/2014  . History of colon polyps   . Hyperlipidemia    has been off of meds d/t getting sick from them.Not been addressed again.   Marland Kitchen Hypertension    takes Ziac daily  . Nocturia   . Pneumonia    hx of-8+yrs ago  . PONV (postoperative nausea and vomiting)   . Shortness of breath dyspnea    thinks d/t pain meds and notices with exertion  . Stroke [facility])    16 yrs ago--left eye     Family History  Problem Relation Age of Onset  . CAD Father     tumor between heart and  lung   . CAD Sister     ? dx   . Hypertension Father   . Hypertension Mother   . Lung cancer Mother     died 5   . Diabetes Maternal Grandmother     late 98s     Social History   Social History  . Marital status: Widowed    Spouse name: N/A  . Number of children: 2  . Years of education: N/A   Occupational History  . retired Scientist, research (physical sciences)   Social History Main Topics  . Smoking status: Former Smoker    Packs/day: 0.25    Years: 44.00    Types: Cigarettes    Quit date: 02/04/2014  . Smokeless tobacco: Never Used     Comment: quit smoking in Dec 2015  . Alcohol use 2.4 oz/week    4 Standard drinks or equivalent per week     Comment: couple times a week  . Drug use: No  . Sexual activity: Not Asked   Other Topics Concern  . None   Social History Narrative   6-8 hours of sleep per night   Lives with her fiance hh of 2 no pets    1 dog in the home  Retired no ets  But tob 8 per day   etoh ocass 1-2    On disability from her neck surgery predicaments .   orig from [employer] in [place] area.    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 2   Last Pap 2006 last mammogram 2000 and   Has dentures   FA        EXAM:  BP (!) 144/78 (BP Location: Right Arm, Patient Position: Sitting, Cuff Size: Large)   Temp 98.2 F (36.8 C) (Oral)   Wt 208 lb (94.3 kg)   BMI 33.57 kg/m   Body mass index is 33.57 kg/m.  GENERAL: vitals reviewed and listed above, alert, oriented, appears well hydrated and in no acute distressNontoxic tired nonicteric. HEENT: atraumatic, conjunctiva  clear, no obvious abnormalities on inspection of external nose and ears  NECK: no obvious masses on inspection palpation  LUNGS: clear to auscultation bilaterally, no wheezes, rales or rhonchi, good air movement CV: HRRR, no clubbing cyanosis or  peripheral edema nl cap refill  Abdomen large well-healed scars soft but tender both upper quadrants left lower quadrant pelvic area  tender question of her bowel. Suprapubic area not particularly tender. Right lower quadrant nontender. Negative psoas sign. Skin normal capillary refill. MS: moves all extremities without noticeable focal  abnormality PSYCH: pleasant and cooperative,   ASSESSMENT AND PLAN:  Discussed the following assessment and plan:  Abdominal pain, unspecified abdominal location - Plan: POCT Urinalysis Dipstick (Automated), Urine culture, CBC with Differential/Platelet, Comprehensive metabolic panel, CT ABDOMEN PELVIS W CONTRAST  Urinary urgency - Plan: POCT Urinalysis Dipstick (Automated), Urine culture, CBC with Differential/Platelet, Comprehensive metabolic panel, CT ABDOMEN PELVIS W CONTRAST  Abdominal pain, left lower quadrant - Plan: CBC with Differential/Platelet, Comprehensive metabolic panel, CT ABDOMEN PELVIS W CONTRAST  Urinary dysfunction - Plan: CBC with Differential/Platelet, Comprehensive metabolic panel, CT ABDOMEN PELVIS W CONTRAST  Need for prophylactic vaccination and inoculation against influenza - Plan: Flu Vaccine QUAD 36+ mos PF IM (Fluarix & Fluzone Quad PF) New onset symptoms pain began first some symptoms consistent with urinary retention by can't feel a mass over the bladder or tenderness is left lower quadrant. Plan laboratory studies abdominal CT pelvic CT consider urologic evaluation culture pending. Not typical of UTI based on urinalysis. -Patient advised to return or notify health care team  if symptoms worsen ,persist or new concerns arise.  Patient Instructions  Getting lab work and abdominal CT today then plan follow-up possible urologic consult.    Standley Brooking. Kerolos Nehme M.D. Allergies as of 03/13/2016      Reactions   Wellbutrin [bupropion] Nausea And Vomiting      Medication List       Accurate as of 03/13/16  4:59 PM. Always use your most recent med list.          bisoprolol-hydrochlorothiazide 5-6.25 MG tablet Commonly known as:  ZIAC Take 1 tablet by mouth  daily.   CENTRUM PO Take 1 tablet by mouth daily.   cetirizine 10 MG tablet Commonly known as:  ZYRTEC Take 10 mg by mouth daily.   fluticasone-salmeterol 115-21 MCG/ACT inhaler Commonly known as:  ADVAIR HFA Inhale 2 puffs into the lungs 2 (two) times daily.   omeprazole 20 MG capsule Commonly known as:  PRILOSEC Take 1 capsule (20 mg total) by mouth daily.   sulfamethoxazole-trimethoprim 800-160 MG tablet Commonly known as:  BACTRIM DS,SEPTRA DS Take 1 tablet by mouth 2 (two) times daily.

## 2016-03-13 ENCOUNTER — Other Ambulatory Visit: Payer: Self-pay | Admitting: Family Medicine

## 2016-03-13 ENCOUNTER — Ambulatory Visit (INDEPENDENT_AMBULATORY_CARE_PROVIDER_SITE_OTHER)
Admission: RE | Admit: 2016-03-13 | Discharge: 2016-03-13 | Disposition: A | Discharge status: Home / Self Care | Payer: Medicare HMO | Source: Ambulatory Visit | Attending: Internal Medicine | Admitting: Internal Medicine

## 2016-03-13 ENCOUNTER — Encounter: Payer: Self-pay | Admitting: Internal Medicine

## 2016-03-13 ENCOUNTER — Ambulatory Visit (INDEPENDENT_AMBULATORY_CARE_PROVIDER_SITE_OTHER): Payer: Medicare HMO | Admitting: Internal Medicine

## 2016-03-13 VITALS — BP 144/78 | Temp 98.2°F | Wt 208.0 lb

## 2016-03-13 DIAGNOSIS — R39198 Other difficulties with micturition: Secondary | ICD-10-CM

## 2016-03-13 DIAGNOSIS — R1032 Left lower quadrant pain: Secondary | ICD-10-CM

## 2016-03-13 DIAGNOSIS — R3915 Urgency of urination: Secondary | ICD-10-CM | POA: Diagnosis not present

## 2016-03-13 DIAGNOSIS — R109 Unspecified abdominal pain: Secondary | ICD-10-CM

## 2016-03-13 DIAGNOSIS — Z23 Encounter for immunization: Secondary | ICD-10-CM

## 2016-03-13 LAB — COMPREHENSIVE METABOLIC PANEL
ALBUMIN: 4.4 g/dL (ref 3.5–5.2)
ALK PHOS: 56 U/L (ref 39–117)
ALT: 60 U/L — AB (ref 0–35)
AST: 35 U/L (ref 0–37)
BILIRUBIN TOTAL: 0.6 mg/dL (ref 0.2–1.2)
BUN: 14 mg/dL (ref 6–23)
CALCIUM: 9.8 mg/dL (ref 8.4–10.5)
CHLORIDE: 99 meq/L (ref 96–112)
CO2: 32 mEq/L (ref 19–32)
CREATININE: 0.8 mg/dL (ref 0.40–1.20)
GFR: 77.68 mL/min (ref >60.00)
Glucose, Bld: 102 mg/dL — ABNORMAL HIGH (ref 70–99)
Potassium: 4.6 mEq/L (ref 3.5–5.1)
SODIUM: 139 meq/L (ref 135–145)
Total Protein: 7 g/dL (ref 6.0–8.3)

## 2016-03-13 LAB — POC URINALSYSI DIPSTICK (AUTOMATED)
BILIRUBIN UA: 1
Blood, UA: NEGATIVE
Glucose, UA: NEGATIVE
KETONES UA: NEGATIVE
LEUKOCYTES UA: NEGATIVE
Nitrite, UA: NEGATIVE
PROTEIN UA: 2
Spec Grav, UA: 1.02
Urobilinogen, UA: 1
pH, UA: 7

## 2016-03-13 LAB — CBC WITH DIFFERENTIAL/PLATELET
BASOS PCT: 0.5 % (ref 0.0–3.0)
Basophils Absolute: 0 10*3/uL (ref 0.0–0.1)
EOS ABS: 0.1 10*3/uL (ref 0.0–0.7)
Eosinophils Relative: 1.2 % (ref 0.0–5.0)
HEMATOCRIT: 40.7 % (ref 36.0–46.0)
Hemoglobin: 13.9 g/dL (ref 12.0–15.0)
Lymphocytes Relative: 23.9 % (ref 12.0–46.0)
Lymphs Abs: 1.8 10*3/uL (ref 0.7–4.0)
MCHC: 34 g/dL (ref 30.0–36.0)
MCV: 89.2 fl (ref 78.0–100.0)
MONO ABS: 0.4 10*3/uL (ref 0.1–1.0)
Monocytes Relative: 5.8 % (ref 3.0–12.0)
Neutro Abs: 5 10*3/uL (ref 1.4–7.7)
Neutrophils Relative %: 68.6 % (ref 43.0–77.0)
Platelets: 222 10*3/uL (ref 150.0–400.0)
RBC: 4.56 Mil/uL (ref 3.87–5.11)
RDW: 13.7 % (ref 11.5–15.5)
WBC: 7.3 10*3/uL (ref 4.0–10.5)

## 2016-03-13 MED ORDER — IOPAMIDOL (ISOVUE-300) INJECTION 61%
100.0000 mL | Freq: Once | INTRAVENOUS | Status: AC | PRN
  Administered 2016-03-13: 100 mL via INTRAVENOUS

## 2016-03-13 MED ORDER — SULFAMETHOXAZOLE-TRIMETHOPRIM 800-160 MG PO TABS: 1.0000 | ORAL_TABLET | Freq: Two times a day (BID) | ORAL | 0 refills | Status: DC

## 2016-03-13 NOTE — Patient Instructions (Signed)
Getting lab work and abdominal CT today then plan follow-up possible urologic consult.

## 2016-03-15 LAB — URINE CULTURE: Organism ID, Bacteria: NO GROWTH

## 2016-03-30 ENCOUNTER — Other Ambulatory Visit (INDEPENDENT_AMBULATORY_CARE_PROVIDER_SITE_OTHER): Payer: Medicare HMO

## 2016-03-30 DIAGNOSIS — R7989 Other specified abnormal findings of blood chemistry: Secondary | ICD-10-CM | POA: Diagnosis not present

## 2016-03-30 DIAGNOSIS — R945 Abnormal results of liver function studies: Secondary | ICD-10-CM

## 2016-03-30 LAB — HEPATIC FUNCTION PANEL
ALK PHOS: 57 U/L (ref 39–117)
ALT: 69 U/L — ABNORMAL HIGH (ref 0–35)
AST: 51 U/L — AB (ref 0–37)
Albumin: 4.6 g/dL (ref 3.5–5.2)
Bilirubin, Direct: 0.1 mg/dL (ref 0.0–0.3)
Total Bilirubin: 0.8 mg/dL (ref 0.2–1.2)
Total Protein: 7.1 g/dL (ref 6.0–8.3)

## 2016-03-30 LAB — IBC PANEL
IRON: 71 ug/dL (ref 42–145)
Saturation Ratios: 16 % — ABNORMAL LOW (ref 20.0–50.0)
TRANSFERRIN: 317 mg/dL (ref 212.0–360.0)

## 2016-03-30 LAB — FERRITIN: Ferritin: 155.1 ng/mL (ref 10.0–291.0)

## 2016-03-31 LAB — HEPATITIS B SURFACE ANTIGEN: Hepatitis B Surface Ag: NEGATIVE

## 2016-03-31 LAB — HEPATITIS B SURFACE ANTIBODY,QUALITATIVE: Hep B S Ab: NEGATIVE

## 2016-03-31 LAB — HEPATITIS B CORE ANTIBODY, TOTAL: Hep B Core Total Ab: NONREACTIVE

## 2016-03-31 LAB — HEPATITIS C ANTIBODY: HCV AB: NEGATIVE

## 2016-04-02 DIAGNOSIS — R102 Pelvic and perineal pain: Secondary | ICD-10-CM | POA: Diagnosis not present

## 2016-04-02 DIAGNOSIS — R1032 Left lower quadrant pain: Secondary | ICD-10-CM | POA: Diagnosis not present

## 2016-04-02 DIAGNOSIS — R3915 Urgency of urination: Secondary | ICD-10-CM | POA: Diagnosis not present

## 2016-04-10 ENCOUNTER — Other Ambulatory Visit: Payer: Self-pay | Admitting: Internal Medicine

## 2016-04-10 DIAGNOSIS — R110 Nausea: Secondary | ICD-10-CM

## 2016-04-11 NOTE — Telephone Encounter (Signed)
° ° ° ° °  Pt following up on refill request    omeprazole (PRILOSEC) 20 MG capsule    [facility]

## 2016-04-12 ENCOUNTER — Other Ambulatory Visit: Payer: Self-pay | Admitting: Family Medicine

## 2016-04-12 ENCOUNTER — Telehealth: Payer: Self-pay | Admitting: Family Medicine

## 2016-04-12 DIAGNOSIS — Z0000 Encounter for general adult medical examination without abnormal findings: Secondary | ICD-10-CM

## 2016-04-12 NOTE — Telephone Encounter (Signed)
Sent to the pharmacy by e-scribe for 90 days.  Pt is due for yearly in May.  Message sent to scheduling.

## 2016-04-12 NOTE — Telephone Encounter (Signed)
Pt is due for yearly and lab work in May 2018.  I have placed the lab orders.  Please help the pt to make both appointments.  Thanks!!!

## 2016-04-12 NOTE — Telephone Encounter (Signed)
Pt has been sch

## 2016-06-16 ENCOUNTER — Other Ambulatory Visit: Payer: Self-pay | Admitting: Internal Medicine

## 2016-06-16 DIAGNOSIS — R110 Nausea: Secondary | ICD-10-CM

## 2016-06-18 NOTE — Telephone Encounter (Signed)
Sent in for 90 days.  Pt is scheduled for yearly on 07/25/16.

## 2016-07-18 ENCOUNTER — Other Ambulatory Visit: Payer: Medicare HMO

## 2016-07-24 NOTE — Progress Notes (Signed)
Chief Complaint  Patient presents with  . Annual Exam    HPI: Patient  Samantha Hinton  61 y.o. comes in today for Preventive Health Care visit  And Chronic disease management  HT  Ok  Off  On meds no cahgne COPD:  Not seen wert in  Over a year. Her insurance would not pay for any controller inhaler that he tried. So she is not anything except as needed albuterol. Fortunately she has had a good 6 months to a year. She can walk about 1-1/2 blocks before she gets a bit winded and the only time that she feels that this risk is that she gets a respiratory infection.  GI no new gi   Health Maintenance  Topic Date Due  . PAP SMEAR  02/20/2007  . COLONOSCOPY  02/20/2011  . MAMMOGRAM  07/02/2015  . INFLUENZA VACCINE  09/19/2016  . TETANUS/TDAP  05/23/2024  . Hepatitis C Screening  Completed  . HIV Screening  Completed   Health Maintenance Review LIFESTYLE:  Exercise:   Starting again  Tobacco/ETS:no   Alcohol: few a week  Sugar beverages:  No  Sleep:   6- 7  Drug use: no HH of   2  2 yorkies  Work: Garment/textile technologist.   On medicare disability and  Not working    ROS: Intermittent left lower pelvic gutter pain saw urologist felt it was from some type of pelvic laxity and advised some physical therapy that she declined at this time. Nothing serious. GEN/ HEENT: No fever, significant weight changes sweats headaches vision problems hearing changes, CV/ PULM; No chest pain shortness of breath cough, syncope,edema  change in exercise tolerance. GI /GU: No adominal pain, vomiting, change in bowel habits. No blood in the stool. No significant GU symptoms. SKIN/HEME: ,no acute skin rashes suspicious lesions or bleeding. No lymphadenopathy, nodules, masses.  NEURO/ PSYCH:  No neurologic signs such as weakness numbness. No depression anxiety. IMM/ Allergy: No unusual infections.  Allergy .   REST of 12 system review negative except as per HPI   Past Medical History:  Diagnosis Date  .  Chronic back pain    buldging disc and tumor.Spondylolisthesis  . COPD (chronic obstructive pulmonary disease) (HCC)    Symbicort daily and ProAir as needed  . Emphysema   . Emphysema of lung ([place])   . Fibromyalgia   . Genital warts   . GERD (gastroesophageal reflux disease)    takes Omeprazole daily  . History of bronchitis 01/2014  . History of colon polyps   . Hyperlipidemia    has been off of meds d/t getting sick from them.Not been addressed again.   Marland Kitchen Hypertension    takes Ziac daily  . Nocturia   . Pneumonia    hx of-8+yrs ago  . PONV (postoperative nausea and vomiting)   . Shortness of breath dyspnea    thinks d/t pain meds and notices with exertion  . 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 ABDOMINAL HYSTERECTOMY  1983   partial   . ADENOIDECTOMY    . ANTERIOR (CYSTOCELE) AND POSTERIOR REPAIR (RECTOCELE) WITH XENFORM GRAFT AND SACROSPINOUS FIXATION    . APPENDECTOMY  1967  . BREAST SURGERY Right    diseased milk glands  . CHOLECYSTECTOMY  1983  . COLONOSCOPY    . DIAGNOSTIC LAPAROSCOPY     cyst removed from ovaries   . ESOPHAGOGASTRODUODENOSCOPY    .  NECK SURGERY  [phone]   c spine ant and post hx fusion and removal or harcdware and shavings  . rods and screws in lumbar    . TONSILLECTOMY      Family History  Problem Relation Age of Onset  . CAD Father        tumor between heart and lung   . Hypertension Father   . CAD Sister        ? dx   . Hypertension Mother   . Lung cancer Mother        died 22   . Diabetes Maternal Grandmother        late 22s     Social History   Social History  . Marital status: Widowed    Spouse name: N/A  . Number of children: 2  . Years of education: N/A   Occupational History  . retired Scientist, research (physical sciences)   Social History Main Topics  . Smoking status: Former Smoker    Packs/day: 0.25    Years: 44.00    Types: Cigarettes    Quit date: 02/04/2014  .  Smokeless tobacco: Never Used     Comment: quit smoking in Dec 2015  . Alcohol use 2.4 oz/week    4 Standard drinks or equivalent per week     Comment: couple times a week  . Drug use: No  . Sexual activity: Not Asked   Other Topics Concern  . None   Social History Narrative   6-8 hours of sleep per night   Lives with her fiance hh of 2 no pets    1 dog in the home   Retired no ets  But tob 8 per day   etoh ocass 1-2    On disability from her neck surgery predicaments .   orig from [employer] in [place] area.    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 2   Last Pap 2006 last mammogram 2000 and   Has dentures   FA     Outpatient Medications Prior to Visit  Medication Sig Dispense Refill  . bisoprolol-hydrochlorothiazide (ZIAC) 5-6.25 MG tablet Take 1 tablet by mouth daily. 90 tablet 3  . cetirizine (ZYRTEC) 10 MG tablet Take 10 mg by mouth daily.    . Multiple Vitamins-Minerals (CENTRUM PO) Take 1 tablet by mouth daily.     Marland Kitchen omeprazole (PRILOSEC) 20 MG capsule TAKE 1 CAPSULE BY MOUTH ONCE DAILY 90 capsule 0  . fluticasone-salmeterol (ADVAIR HFA) 115-21 MCG/ACT inhaler Inhale 2 puffs into the lungs 2 (two) times daily. (Patient not taking: Reported on 07/25/2016) 1 Inhaler 6  . sulfamethoxazole-trimethoprim (BACTRIM DS,SEPTRA DS) 800-160 MG tablet Take 1 tablet by mouth 2 (two) times daily. 10 tablet 0   No facility-administered medications prior to visit.      EXAM:  BP 130/84 (BP Location: Right Arm, Patient Position: Sitting, Cuff Size: Normal)   Pulse 76   Temp 97.9 F (36.6 C) (Oral)   Ht 5' 5.5" (1.664 m)   Wt 196 lb (88.9 kg)   BMI 32.12 kg/m   Body mass index is 32.12 kg/m. Wt Readings from Last 3 Encounters:  07/25/16 196 lb (88.9 kg)  03/13/16 208 lb (94.3 kg)  12/16/15 202 lb 6.4 oz (91.8 kg)    Physical Exam: Vital signs reviewed XJO:ITGP is a well-developed well-nourished alert cooperative    who appearsr stated age in no acute  distress.  HEENT: normocephalic atraumatic , Eyes: PERRL  EOM's full, conjunctiva clear, Nares: paten,t no deformity discharge or tenderness., Ears: no deformity EAC's clear TMs with normal landmarks. Mouth: clear OP, no lesions, edema.  Moist mucous membranes. Dentition in adequate repair. NECK: supple without masses, thyromegaly or bruits. CHEST/PULM:  Clear to auscultation and percussion breath sounds equal no wheeze , rales or rhonchi. No chest wall deformities or tenderness.  decrese BAS throughout some kyphosis  Breast: normal by inspection . No dimpling, discharge, masses, tenderness or discharge . CV: PMI is nondisplaced, S1 S2 no gallops, murmurs, rubs. Peripheral pulses are full without delay.No JVD .  ABDOMEN: Bowel sounds normal nontender  No guard or rebound, no hepato splenomegal no CVA tenderness.  No hernia. Extremtities:  No clubbing cyanosis or edema, no acute joint swelling or redness no focal atrophy NEURO:  Oriented x3, cranial nerves 3-12 appear to be intact, no obvious focal weakness,gait within normal limits no abnormal reflexes or asymmetrical SKIN: No acute rashes normal turgor, color, no bruising or petechiae. PSYCH: Oriented, good eye contact, no obvious depression anxiety, cognition and judgment appear normal. LN: no cervical axillary inguinal adenopathy    BP Readings from Last 3 Encounters:  07/25/16 130/84  03/13/16 (!) 144/78  12/16/15 (!) 164/100    Lab results reviewed with patient   ASSESSMENT AND PLAN:  Discussed the following assessment and plan:  Visit for preventive health examination  Essential hypertension, benign - Plan: Lipid panel, Basic metabolic panel, Hepatic function panel  Medication management - Plan: Lipid panel, Basic metabolic panel, Hepatic function panel  Hyperlipidemia, unspecified hyperlipidemia type - Plan: Lipid panel, Basic metabolic panel, Hepatic function panel  Abnormal LFTs - Plan: Lipid panel, Basic metabolic panel,  Hepatic function panel  COPD GOLD II  Need for prophylactic vaccination against Streptococcus pneumoniae (pneumococcus) - Plan: Pneumococcal polysaccharide vaccine 23-valent greater than or equal to 2yo subcutaneous/IM Discussed options for controller medicine will send in [place] see if it is acceptable. [employer] Internet server was down during her visit some of this is no was reconstructed. Had side effects of some lipid medication given to her in the past if her lipids are out of whack and medication needed would try a different one.  Lab Results  Component Value Date   WBC 7.3 03/13/2016   HGB 13.9 03/13/2016   HCT 40.7 03/13/2016   PLT 222.0 03/13/2016   GLUCOSE 94 07/25/2016   CHOL 288 (H) 07/25/2016   TRIG 285.0 (H) 07/25/2016   HDL 57.50 07/25/2016   LDLDIRECT 192.0 07/25/2016   LDLCALC 116 (H) 05/24/2014   ALT 18 07/25/2016   AST 18 07/25/2016   NA 136 07/25/2016   K 4.2 07/25/2016   CL 97 07/25/2016   CREATININE 0.73 07/25/2016   BUN 18 07/25/2016   CO2 32 07/25/2016   TSH 1.17 06/29/2015    Patient Care Team: Burnis Medin, MD as PCP - General (Internal Medicine) Tanda Rockers, MD as Consulting Physician (Pulmonary Disease) Inda Castle, MD as Consulting Physician (Gastroenterology) Karie Chimera, MD as Consulting Physician (Neurosurgery) Patient Instructions  Will send in breo and see if covered    You may want to contact your insurance   Yourself and ask what inhalers are covered for  Copd .     Get your mammo and colon cancer screening  Continue  bp medication at this time.  At some point   May want to do yupdated PFT tests.   Health Maintenance, Female Adopting a healthy lifestyle and getting preventive care  can go a long way to promote health and wellness. Talk with your health care provider about what schedule of regular examinations is right for you. This is a good chance for you to check in with your provider about disease prevention and staying  healthy. In between checkups, there are plenty of things you can do on your own. Experts have done a lot of research about which lifestyle changes and preventive measures are most likely to keep you healthy. Ask your health care provider for more information. Weight and diet Eat a healthy diet  Be sure to include plenty of vegetables, fruits, low-fat dairy products, and lean protein.  Do not eat a lot of foods high in solid fats, added sugars, or salt.  Get regular exercise. This is one of the most important things you can do for your health. ? Most adults should exercise for at least 150 minutes each week. The exercise should increase your heart rate and make you sweat (moderate-intensity exercise). ? Most adults should also do strengthening exercises at least twice a week. This is in addition to the moderate-intensity exercise.  Maintain a healthy weight  Body mass index (BMI) is a measurement that can be used to identify possible weight problems. It estimates body fat based on height and weight. Your health care provider can help determine your BMI and help you achieve or maintain a healthy weight.  For females 32 years of age and older: ? A BMI below 18.5 is considered underweight. ? A BMI of 18.5 to 24.9 is normal. ? A BMI of 25 to 29.9 is considered overweight. ? A BMI of 30 and above is considered obese.  Watch levels of cholesterol and blood lipids  You should start having your blood tested for lipids and cholesterol at 61 years of age, then have this test every 5 years.  You may need to have your cholesterol levels checked more often if: ? Your lipid or cholesterol levels are high. ? You are older than 61 years of age. ? You are at high risk for heart disease.  Cancer screening Lung Cancer  Lung cancer screening is recommended for adults 68-22 years old who are at high risk for lung cancer because of a history of smoking.  A yearly low-dose CT scan of the lungs is  recommended for people who: ? Currently smoke. ? Have quit within the past 15 years. ? Have at least a 30-pack-year history of smoking. A pack year is smoking an average of one pack of cigarettes a day for 1 year.  Yearly screening should continue until it has been 15 years since you quit.  Yearly screening should stop if you develop a health problem that would prevent you from having lung cancer treatment.  Breast Cancer  Practice breast self-awareness. This means understanding how your breasts normally appear and feel.  It also means doing regular breast self-exams. Let your health care provider know about any changes, no matter how small.  If you are in your 20s or 30s, you should have a clinical breast exam (CBE) by a health care provider every 1-3 years as part of a regular health exam.  If you are 14 or older, have a CBE every year. Also consider having a breast X-ray (mammogram) every year.  If you have a family history of breast cancer, talk to your health care provider about genetic screening.  If you are at high risk for breast cancer, talk to your health care provider about  having an MRI and a mammogram every year.  Breast cancer gene (BRCA) assessment is recommended for women who have family members with BRCA-related cancers. BRCA-related cancers include: ? Breast. ? Ovarian. ? Tubal. ? Peritoneal cancers.  Results of the assessment will determine the need for genetic counseling and BRCA1 and BRCA2 testing.  Cervical Cancer Your health care provider may recommend that you be screened regularly for cancer of the pelvic organs (ovaries, uterus, and vagina). This screening involves a pelvic examination, including checking for microscopic changes to the surface of your cervix (Pap test). You may be encouraged to have this screening done every 3 years, beginning at age 2.  For women ages 71-65, health care providers may recommend pelvic exams and Pap testing every 3 years,  or they may recommend the Pap and pelvic exam, combined with testing for human papilloma virus (HPV), every 5 years. Some types of HPV increase your risk of cervical cancer. Testing for HPV may also be done on women of any age with unclear Pap test results.  Other health care providers may not recommend any screening for nonpregnant women who are considered low risk for pelvic cancer and who do not have symptoms. Ask your health care provider if a screening pelvic exam is right for you.  If you have had past treatment for cervical cancer or a condition that could lead to cancer, you need Pap tests and screening for cancer for at least 20 years after your treatment. If Pap tests have been discontinued, your risk factors (such as having a new sexual partner) need to be reassessed to determine if screening should resume. Some women have medical problems that increase the chance of getting cervical cancer. In these cases, your health care provider may recommend more frequent screening and Pap tests.  Colorectal Cancer  This type of cancer can be detected and often prevented.  Routine colorectal cancer screening usually begins at 61 years of age and continues through 60 years of age.  Your health care provider may recommend screening at an earlier age if you have risk factors for colon cancer.  Your health care provider may also recommend using home test kits to check for hidden blood in the stool.  A small camera at the end of a tube can be used to examine your colon directly (sigmoidoscopy or colonoscopy). This is done to check for the earliest forms of colorectal cancer.  Routine screening usually begins at age 19.  Direct examination of the colon should be repeated every 5-10 years through 61 years of age. However, you may need to be screened more often if early forms of precancerous polyps or small growths are found.  Skin Cancer  Check your skin from head to toe regularly.  Tell your  health care provider about any new moles or changes in moles, especially if there is a change in a mole's shape or color.  Also tell your health care provider if you have a mole that is larger than the size of a pencil eraser.  Always use sunscreen. Apply sunscreen liberally and repeatedly throughout the day.  Protect yourself by wearing long sleeves, pants, a wide-brimmed hat, and sunglasses whenever you are outside.  Heart disease, diabetes, and high blood pressure  High blood pressure causes heart disease and increases the risk of stroke. High blood pressure is more likely to develop in: ? People who have blood pressure in the high end of the normal range (130-139/85-89 mm Hg). ? People who are  overweight or obese. ? People who are African American.  If you are 76-14 years of age, have your blood pressure checked every 3-5 years. If you are 41 years of age or older, have your blood pressure checked every year. You should have your blood pressure measured twice-once when you are at a hospital or clinic, and once when you are not at a hospital or clinic. Record the average of the two measurements. To check your blood pressure when you are not at a hospital or clinic, you can use: ? An automated blood pressure machine at a pharmacy. ? A home blood pressure monitor.  If you are between 78 years and 32 years old, ask your health care provider if you should take aspirin to prevent strokes.  Have regular diabetes screenings. This involves taking a blood sample to check your fasting blood sugar level. ? If you are at a normal weight and have a low risk for diabetes, have this test once every three years after 61 years of age. ? If you are overweight and have a high risk for diabetes, consider being tested at a younger age or more often. Preventing infection Hepatitis B  If you have a higher risk for hepatitis B, you should be screened for this virus. You are considered at high risk for  hepatitis B if: ? You were born in a country where hepatitis B is common. Ask your health care provider which countries are considered high risk. ? Your parents were born in a high-risk country, and you have not been immunized against hepatitis B (hepatitis B vaccine). ? You have HIV or AIDS. ? You use needles to inject street drugs. ? You live with someone who has hepatitis B. ? You have had sex with someone who has hepatitis B. ? You get hemodialysis treatment. ? You take certain medicines for conditions, including cancer, organ transplantation, and autoimmune conditions.  Hepatitis C  Blood testing is recommended for: ? Everyone born from [birth year] through 1965. ? Anyone with known risk factors for hepatitis C.  Sexually transmitted infections (STIs)  You should be screened for sexually transmitted infections (STIs) including gonorrhea and chlamydia if: ? You are sexually active and are younger than 61 years of age. ? You are older than 61 years of age and your health care provider tells you that you are at risk for this type of infection. ? Your sexual activity has changed since you were last screened and you are at an increased risk for chlamydia or gonorrhea. Ask your health care provider if you are at risk.  If you do not have HIV, but are at risk, it may be recommended that you take a prescription medicine daily to prevent HIV infection. This is called pre-exposure prophylaxis (PrEP). You are considered at risk if: ? You are sexually active and do not regularly use condoms or know the HIV status of your partner(s). ? You take drugs by injection. ? You are sexually active with a partner who has HIV.  Talk with your health care provider about whether you are at high risk of being infected with HIV. If you choose to begin PrEP, you should first be tested for HIV. You should then be tested every 3 months for as long as you are taking PrEP. Pregnancy  If you are premenopausal and you may  become pregnant, ask your health care provider about preconception counseling.  If you may become pregnant, take 400 to 800 micrograms (mcg) of folic acid  every day.  If you want to prevent pregnancy, talk to your health care provider about birth control (contraception). Osteoporosis and menopause  Osteoporosis is a disease in which the bones lose minerals and strength with aging. This can result in serious bone fractures. Your risk for osteoporosis can be identified using a bone density scan.  If you are 50 years of age or older, or if you are at risk for osteoporosis and fractures, ask your health care provider if you should be screened.  Ask your health care provider whether you should take a calcium or vitamin D supplement to lower your risk for osteoporosis.  Menopause may have certain physical symptoms and risks.  Hormone replacement therapy may reduce some of these symptoms and risks. Talk to your health care provider about whether hormone replacement therapy is right for you. Follow these instructions at home:  Schedule regular health, dental, and eye exams.  Stay current with your immunizations.  Do not use any tobacco products including cigarettes, chewing tobacco, or electronic cigarettes.  If you are pregnant, do not drink alcohol.  If you are breastfeeding, limit how much and how often you drink alcohol.  Limit alcohol intake to no more than 1 drink per day for nonpregnant women. One drink equals 12 ounces of beer, 5 ounces of wine, or 1 ounces of hard liquor.  Do not use street drugs.  Do not share needles.  Ask your health care provider for help if you need support or information about quitting drugs.  Tell your health care provider if you often feel depressed.  Tell your health care provider if you have ever been abused or do not feel safe at home. This information is not intended to replace advice given to you by your health care provider. Make sure you  discuss any questions you have with your health care provider. Document Released: 08/21/2010 Document Revised: 07/14/2015 Document Reviewed: 11/09/2014 Elsevier Interactive Patient Education  2018 [place]. Panosh M.D.

## 2016-07-25 ENCOUNTER — Encounter: Payer: Self-pay | Admitting: Internal Medicine

## 2016-07-25 ENCOUNTER — Ambulatory Visit (INDEPENDENT_AMBULATORY_CARE_PROVIDER_SITE_OTHER): Payer: Medicare HMO | Admitting: Internal Medicine

## 2016-07-25 VITALS — BP 130/84 | HR 76 | Temp 97.9°F | Ht 65.5 in | Wt 196.0 lb

## 2016-07-25 DIAGNOSIS — J449 Chronic obstructive pulmonary disease, unspecified: Secondary | ICD-10-CM

## 2016-07-25 DIAGNOSIS — Z79899 Other long term (current) drug therapy: Secondary | ICD-10-CM | POA: Diagnosis not present

## 2016-07-25 DIAGNOSIS — E785 Hyperlipidemia, unspecified: Secondary | ICD-10-CM | POA: Diagnosis not present

## 2016-07-25 DIAGNOSIS — R945 Abnormal results of liver function studies: Secondary | ICD-10-CM | POA: Diagnosis not present

## 2016-07-25 DIAGNOSIS — Z0000 Encounter for general adult medical examination without abnormal findings: Secondary | ICD-10-CM

## 2016-07-25 DIAGNOSIS — I10 Essential (primary) hypertension: Secondary | ICD-10-CM | POA: Diagnosis not present

## 2016-07-25 DIAGNOSIS — R7989 Other specified abnormal findings of blood chemistry: Secondary | ICD-10-CM

## 2016-07-25 DIAGNOSIS — Z23 Encounter for immunization: Secondary | ICD-10-CM

## 2016-07-25 LAB — BASIC METABOLIC PANEL
BUN: 18 mg/dL (ref 6–23)
CHLORIDE: 97 meq/L (ref 96–112)
CO2: 32 meq/L (ref 19–32)
Calcium: 9.7 mg/dL (ref 8.4–10.5)
Creatinine, Ser: 0.73 mg/dL (ref 0.40–1.20)
GFR: 86.23 mL/min (ref >60.00)
Glucose, Bld: 94 mg/dL (ref 70–99)
POTASSIUM: 4.2 meq/L (ref 3.5–5.1)
Sodium: 136 mEq/L (ref 135–145)

## 2016-07-25 LAB — LIPID PANEL
CHOL/HDL RATIO: 5
CHOLESTEROL: 288 mg/dL — AB (ref 0–200)
HDL: 57.5 mg/dL (ref >39.00)
NONHDL: 230.47
Triglycerides: 285 mg/dL — ABNORMAL HIGH (ref 0.0–149.0)
VLDL: 57 mg/dL — ABNORMAL HIGH (ref 0.0–40.0)

## 2016-07-25 LAB — HEPATIC FUNCTION PANEL
ALK PHOS: 65 U/L (ref 39–117)
ALT: 18 U/L (ref 0–35)
AST: 18 U/L (ref 0–37)
Albumin: 4.2 g/dL (ref 3.5–5.2)
BILIRUBIN DIRECT: 0.1 mg/dL (ref 0.0–0.3)
BILIRUBIN TOTAL: 0.7 mg/dL (ref 0.2–1.2)
TOTAL PROTEIN: 6.9 g/dL (ref 6.0–8.3)

## 2016-07-25 LAB — LDL CHOLESTEROL, DIRECT: Direct LDL: 192 mg/dL

## 2016-07-25 MED ORDER — FLUTICASONE FUROATE-VILANTEROL 100-25 MCG/INH IN AEPB: 1.0000 | INHALATION_SPRAY | Freq: Every day | RESPIRATORY_TRACT | 99 refills | Status: DC

## 2016-07-25 NOTE — Patient Instructions (Addendum)
Will send in breo and see if covered    You may want to contact your insurance   Yourself and ask what inhalers are covered for  Copd .     Get your mammo and colon cancer screening  Continue  bp medication at this time.  At some point   May want to do yupdated PFT tests.   Health Maintenance, Female Adopting a healthy lifestyle and getting preventive care can go a long way to promote health and wellness. Talk with your health care provider about what schedule of regular examinations is right for you. This is a good chance for you to check in with your provider about disease prevention and staying healthy. In between checkups, there are plenty of things you can do on your own. Experts have done a lot of research about which lifestyle changes and preventive measures are most likely to keep you healthy. Ask your health care provider for more information. Weight and diet Eat a healthy diet  Be sure to include plenty of vegetables, fruits, low-fat dairy products, and lean protein.  Do not eat a lot of foods high in solid fats, added sugars, or salt.  Get regular exercise. This is one of the most important things you can do for your health. ? Most adults should exercise for at least 150 minutes each week. The exercise should increase your heart rate and make you sweat (moderate-intensity exercise). ? Most adults should also do strengthening exercises at least twice a week. This is in addition to the moderate-intensity exercise.  Maintain a healthy weight  Body mass index (BMI) is a measurement that can be used to identify possible weight problems. It estimates body fat based on height and weight. Your health care provider can help determine your BMI and help you achieve or maintain a healthy weight.  For females 24 years of age and older: ? A BMI below 18.5 is considered underweight. ? A BMI of 18.5 to 24.9 is normal. ? A BMI of 25 to 29.9 is considered overweight. ? A BMI of 30 and above is  considered obese.  Watch levels of cholesterol and blood lipids  You should start having your blood tested for lipids and cholesterol at 61 years of age, then have this test every 5 years.  You may need to have your cholesterol levels checked more often if: ? Your lipid or cholesterol levels are high. ? You are older than 61 years of age. ? You are at high risk for heart disease.  Cancer screening Lung Cancer  Lung cancer screening is recommended for adults 71-64 years old who are at high risk for lung cancer because of a history of smoking.  A yearly low-dose CT scan of the lungs is recommended for people who: ? Currently smoke. ? Have quit within the past 15 years. ? Have at least a 30-pack-year history of smoking. A pack year is smoking an average of one pack of cigarettes a day for 1 year.  Yearly screening should continue until it has been 15 years since you quit.  Yearly screening should stop if you develop a health problem that would prevent you from having lung cancer treatment.  Breast Cancer  Practice breast self-awareness. This means understanding how your breasts normally appear and feel.  It also means doing regular breast self-exams. Let your health care provider know about any changes, no matter how small.  If you are in your 20s or 30s, you should have a clinical  breast exam (CBE) by a health care provider every 1-3 years as part of a regular health exam.  If you are 72 or older, have a CBE every year. Also consider having a breast X-ray (mammogram) every year.  If you have a family history of breast cancer, talk to your health care provider about genetic screening.  If you are at high risk for breast cancer, talk to your health care provider about having an MRI and a mammogram every year.  Breast cancer gene (BRCA) assessment is recommended for women who have family members with BRCA-related cancers. BRCA-related cancers  include: ? Breast. ? Ovarian. ? Tubal. ? Peritoneal cancers.  Results of the assessment will determine the need for genetic counseling and BRCA1 and BRCA2 testing.  Cervical Cancer Your health care provider may recommend that you be screened regularly for cancer of the pelvic organs (ovaries, uterus, and vagina). This screening involves a pelvic examination, including checking for microscopic changes to the surface of your cervix (Pap test). You may be encouraged to have this screening done every 3 years, beginning at age 11.  For women ages 26-65, health care providers may recommend pelvic exams and Pap testing every 3 years, or they may recommend the Pap and pelvic exam, combined with testing for human papilloma virus (HPV), every 5 years. Some types of HPV increase your risk of cervical cancer. Testing for HPV may also be done on women of any age with unclear Pap test results.  Other health care providers may not recommend any screening for nonpregnant women who are considered low risk for pelvic cancer and who do not have symptoms. Ask your health care provider if a screening pelvic exam is right for you.  If you have had past treatment for cervical cancer or a condition that could lead to cancer, you need Pap tests and screening for cancer for at least 20 years after your treatment. If Pap tests have been discontinued, your risk factors (such as having a new sexual partner) need to be reassessed to determine if screening should resume. Some women have medical problems that increase the chance of getting cervical cancer. In these cases, your health care provider may recommend more frequent screening and Pap tests.  Colorectal Cancer  This type of cancer can be detected and often prevented.  Routine colorectal cancer screening usually begins at 61 years of age and continues through 61 years of age.  Your health care provider may recommend screening at an earlier age if you have risk factors  for colon cancer.  Your health care provider may also recommend using home test kits to check for hidden blood in the stool.  A small camera at the end of a tube can be used to examine your colon directly (sigmoidoscopy or colonoscopy). This is done to check for the earliest forms of colorectal cancer.  Routine screening usually begins at age 58.  Direct examination of the colon should be repeated every 5-10 years through 61 years of age. However, you may need to be screened more often if early forms of precancerous polyps or small growths are found.  Skin Cancer  Check your skin from head to toe regularly.  Tell your health care provider about any new moles or changes in moles, especially if there is a change in a mole's shape or color.  Also tell your health care provider if you have a mole that is larger than the size of a pencil eraser.  Always use sunscreen. Apply  sunscreen liberally and repeatedly throughout the day.  Protect yourself by wearing long sleeves, pants, a wide-brimmed hat, and sunglasses whenever you are outside.  Heart disease, diabetes, and high blood pressure  High blood pressure causes heart disease and increases the risk of stroke. High blood pressure is more likely to develop in: ? People who have blood pressure in the high end of the normal range (130-139/85-89 mm Hg). ? People who are overweight or obese. ? People who are African American.  If you are 23-42 years of age, have your blood pressure checked every 3-5 years. If you are 21 years of age or older, have your blood pressure checked every year. You should have your blood pressure measured twice-once when you are at a hospital or clinic, and once when you are not at a hospital or clinic. Record the average of the two measurements. To check your blood pressure when you are not at a hospital or clinic, you can use: ? An automated blood pressure machine at a pharmacy. ? A home blood pressure monitor.  If  you are between 14 years and 30 years old, ask your health care provider if you should take aspirin to prevent strokes.  Have regular diabetes screenings. This involves taking a blood sample to check your fasting blood sugar level. ? If you are at a normal weight and have a low risk for diabetes, have this test once every three years after 61 years of age. ? If you are overweight and have a high risk for diabetes, consider being tested at a younger age or more often. Preventing infection Hepatitis B  If you have a higher risk for hepatitis B, you should be screened for this virus. You are considered at high risk for hepatitis B if: ? You were born in a country where hepatitis B is common. Ask your health care provider which countries are considered high risk. ? Your parents were born in a high-risk country, and you have not been immunized against hepatitis B (hepatitis B vaccine). ? You have HIV or AIDS. ? You use needles to inject street drugs. ? You live with someone who has hepatitis B. ? You have had sex with someone who has hepatitis B. ? You get hemodialysis treatment. ? You take certain medicines for conditions, including cancer, organ transplantation, and autoimmune conditions.  Hepatitis C  Blood testing is recommended for: ? Everyone born from [birth year] through 1965. ? Anyone with known risk factors for hepatitis C.  Sexually transmitted infections (STIs)  You should be screened for sexually transmitted infections (STIs) including gonorrhea and chlamydia if: ? You are sexually active and are younger than 62 years of age. ? You are older than 61 years of age and your health care provider tells you that you are at risk for this type of infection. ? Your sexual activity has changed since you were last screened and you are at an increased risk for chlamydia or gonorrhea. Ask your health care provider if you are at risk.  If you do not have HIV, but are at risk, it may be recommended  that you take a prescription medicine daily to prevent HIV infection. This is called pre-exposure prophylaxis (PrEP). You are considered at risk if: ? You are sexually active and do not regularly use condoms or know the HIV status of your partner(s). ? You take drugs by injection. ? You are sexually active with a partner who has HIV.  Talk with your health care provider  about whether you are at high risk of being infected with HIV. If you choose to begin PrEP, you should first be tested for HIV. You should then be tested every 3 months for as long as you are taking PrEP. Pregnancy  If you are premenopausal and you may become pregnant, ask your health care provider about preconception counseling.  If you may become pregnant, take 400 to 800 micrograms (mcg) of folic acid every day.  If you want to prevent pregnancy, talk to your health care provider about birth control (contraception). Osteoporosis and menopause  Osteoporosis is a disease in which the bones lose minerals and strength with aging. This can result in serious bone fractures. Your risk for osteoporosis can be identified using a bone density scan.  If you are 85 years of age or older, or if you are at risk for osteoporosis and fractures, ask your health care provider if you should be screened.  Ask your health care provider whether you should take a calcium or vitamin D supplement to lower your risk for osteoporosis.  Menopause may have certain physical symptoms and risks.  Hormone replacement therapy may reduce some of these symptoms and risks. Talk to your health care provider about whether hormone replacement therapy is right for you. Follow these instructions at home:  Schedule regular health, dental, and eye exams.  Stay current with your immunizations.  Do not use any tobacco products including cigarettes, chewing tobacco, or electronic cigarettes.  If you are pregnant, do not drink alcohol.  If you are  breastfeeding, limit how much and how often you drink alcohol.  Limit alcohol intake to no more than 1 drink per day for nonpregnant women. One drink equals 12 ounces of beer, 5 ounces of wine, or 1 ounces of hard liquor.  Do not use street drugs.  Do not share needles.  Ask your health care provider for help if you need support or information about quitting drugs.  Tell your health care provider if you often feel depressed.  Tell your health care provider if you have ever been abused or do not feel safe at home. This information is not intended to replace advice given to you by your health care provider. Make sure you discuss any questions you have with your health care provider. Document Released: 08/21/2010 Document Revised: 07/14/2015 Document Reviewed: 11/09/2014 Elsevier Interactive Patient Education  [employer].

## 2016-07-26 ENCOUNTER — Telehealth: Payer: Self-pay | Admitting: Internal Medicine

## 2016-07-26 NOTE — Telephone Encounter (Signed)
Pt is returning your call.  Please contact patient back at primary number.

## 2016-07-27 ENCOUNTER — Other Ambulatory Visit: Payer: Self-pay | Admitting: Emergency Medicine

## 2016-07-27 DIAGNOSIS — E785 Hyperlipidemia, unspecified: Secondary | ICD-10-CM

## 2016-07-27 MED ORDER — ATORVASTATIN CALCIUM 10 MG PO TABS: 10.0000 mg | ORAL_TABLET | Freq: Every day | ORAL | 3 refills | Status: DC

## 2016-07-27 NOTE — Telephone Encounter (Signed)
Spoke with pt nothing further needed  

## 2016-08-26 ENCOUNTER — Other Ambulatory Visit: Payer: Self-pay | Admitting: Internal Medicine

## 2016-10-15 ENCOUNTER — Other Ambulatory Visit: Payer: Self-pay | Admitting: Internal Medicine

## 2016-10-15 DIAGNOSIS — R110 Nausea: Secondary | ICD-10-CM

## 2017-01-23 ENCOUNTER — Ambulatory Visit: Payer: Medicare HMO | Admitting: Internal Medicine

## 2017-01-23 ENCOUNTER — Encounter: Payer: Self-pay | Admitting: Internal Medicine

## 2017-01-23 VITALS — BP 152/92 | HR 86 | Temp 97.9°F | Wt 202.6 lb

## 2017-01-23 DIAGNOSIS — J441 Chronic obstructive pulmonary disease with (acute) exacerbation: Secondary | ICD-10-CM

## 2017-01-23 DIAGNOSIS — R69 Illness, unspecified: Secondary | ICD-10-CM | POA: Diagnosis not present

## 2017-01-23 DIAGNOSIS — J019 Acute sinusitis, unspecified: Secondary | ICD-10-CM | POA: Diagnosis not present

## 2017-01-23 MED ORDER — IPRATROPIUM-ALBUTEROL 0.5-2.5 (3) MG/3ML IN SOLN
3.0000 mL | Freq: Four times a day (QID) | RESPIRATORY_TRACT | 2 refills | Status: DC | PRN
Start: 2017-01-23 — End: 2017-06-25

## 2017-01-23 MED ORDER — PREDNISONE 20 MG PO TABS
ORAL_TABLET | ORAL | 0 refills | Status: DC
Start: 2017-01-23 — End: 2017-06-25

## 2017-01-23 MED ORDER — AMOXICILLIN-POT CLAVULANATE 875-125 MG PO TABS: 1.0000 | ORAL_TABLET | Freq: Two times a day (BID) | ORAL | 0 refills | Status: DC

## 2017-01-23 MED ORDER — BUDESONIDE-FORMOTEROL FUMARATE 160-4.5 MCG/ACT IN AERO: 2.0000 | INHALATION_SPRAY | Freq: Two times a day (BID) | RESPIRATORY_TRACT | 3 refills | Status: DC

## 2017-01-23 NOTE — Progress Notes (Signed)
Chief Complaint  Patient presents with  . Cough    x 1.5 month. Pt had diarrhea and fever x 4-5 days ago. Last reported fever x 3 days ago. C/o congestion in chest and Brown mucus from head. tried Delsym, Nyquil, Tessalon Perles, Tylenol -- no relief. Audible wheeze    HPI: Samantha Hinton 61 y.o.  sda     "Same stuff evey year"  Getting worse  See above   daitrrhea and fever better.  She has had upper respiratory sinus pressure and down to her chest. Not getting better   anmd felt like neededing to go p to ed   On weekend .  Begins with ur congestion and  Impacted in sinus and got better and then worse.  10 days .  Dry wheezy cough using delsym.   Niyquil.  Got symbicort and is out.   No tobacco  ROS: See pertinent positives and negatives per HPI. No fever chills   Past Medical History:  Diagnosis Date  . Chronic back pain    buldging disc and tumor.Spondylolisthesis  . COPD (chronic obstructive pulmonary disease) (HCC)    Symbicort daily and ProAir as needed  . Emphysema   . Emphysema of lung ([place])   . Fibromyalgia   . Genital warts   . GERD (gastroesophageal reflux disease)    takes Omeprazole daily  . History of bronchitis 01/2014  . History of colon polyps   . Hyperlipidemia    has been off of meds d/t getting sick from them.Not been addressed again.   Marland Kitchen Hypertension    takes Ziac daily  . Nocturia   . Pneumonia    hx of-8+yrs ago  . PONV (postoperative nausea and vomiting)   . Shortness of breath dyspnea    thinks d/t pain meds and notices with exertion  . 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 CAD Father        tumor between heart and lung   . Hypertension Father   . CAD Sister        ? dx   . Hypertension Mother   . Lung cancer Mother        died 68   . Diabetes Maternal Grandmother        late 8s     Social History   Socioeconomic History  . Marital status: Widowed    Spouse name: None    . Number of children: 2  . Years of education: None  . Highest education level: None  Social Needs  . Financial resource strain: None  . Food insecurity - worry: None  . Food insecurity - inability: None  . Transportation needs - medical: None  . Transportation needs - non-medical: None  Occupational History  . Occupation: retired    Fish farm manager: [name]  Tobacco Use  . Smoking status: Former Smoker    Packs/day: 0.25    Years: 44.00    Pack years: 11.00    Types: Cigarettes    Last attempt to quit: 02/04/2014    Years since quitting: 2.9  . Smokeless tobacco: Never Used  . Tobacco comment: quit smoking in Dec 2015  Substance and Sexual Activity  . Alcohol use: Yes    Alcohol/week: 2.4 oz    Types: 4 Standard drinks or equivalent per week    Comment: couple times a week  . Drug use: No  .  Sexual activity: None  Other Topics Concern  . None  Social History Narrative   6-8 hours of sleep per night   Lives with her fiance hh of 2 no pets    1 dog in the home   Retired no ets  But tob 8 per day   etoh ocass 1-2    On disability from her neck surgery predicaments .   orig from [employer] in [place] area.    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 2   Last Pap 2006 last mammogram 2000 and   Has dentures   FA     Outpatient Medications Prior to Visit  Medication Sig Dispense Refill  . bisoprolol-hydrochlorothiazide (ZIAC) 5-6.25 MG tablet TAKE ONE TABLET BY MOUTH ONCE DAILY 90 tablet 1  . cetirizine (ZYRTEC) 10 MG tablet Take 10 mg by mouth daily.    . Multiple Vitamins-Minerals (CENTRUM PO) Take 1 tablet by mouth daily.     Marland Kitchen omeprazole (PRILOSEC) 20 MG capsule TAKE 1 CAPSULE BY MOUTH ONCE DAILY 90 capsule 2  . atorvastatin (LIPITOR) 10 MG tablet Take 1 tablet (10 mg total) by mouth daily. (Patient not taking: Reported on 01/23/2017) 30 tablet 3  . fluticasone furoate-vilanterol (BREO ELLIPTA) 100-25 MCG/INH AEPB Inhale 1 puff into the lungs daily.  (Patient not taking: Reported on 01/23/2017) 1 each prn  . fluticasone-salmeterol (ADVAIR HFA) 115-21 MCG/ACT inhaler Inhale 2 puffs into the lungs 2 (two) times daily. (Patient not taking: Reported on 07/25/2016) 1 Inhaler 6   No facility-administered medications prior to visit.      EXAM:  BP (!) 152/92 (BP Location: Left Arm, Patient Position: Sitting, Cuff Size: Normal)   Pulse 86   Temp 97.9 F (36.6 C) (Oral)   Wt 202 lb 9.6 oz (91.9 kg)   SpO2 93%   BMI 33.20 kg/m   Body mass index is 33.2 kg/m. WDWN in NAD  quiet respirations; very congested  somewhat hoarse. Non toxic . HEENT: Normocephalic ;atraumatic , Eyes;  PERRL, EOMs  Full, lids and conjunctiva clear,,Ears: no deformities, canals nl, TM landmarks normal, Nose: no deformity or discharge but verycongested;face pos max and  frontaltender Mouth : OP clear without lesion or edema . Neck: Supple without adenopathy or masses or bruits Chest:    Wheezes bilaterally   CV:  S1-S2 no gallops or murmurs peripheral perfusion is normal Skin :nl perfusion and no acute rashes   ASSESSMENT AND PLAN:  Discussed the following assessment and plan:  Acute sinusitis with symptoms greater than 10 days  COPD exacerbation (HCC)   Expectant management.   Steroids and  antibiotic .  -Patient advised to return or notify health care team  if symptoms worsen ,persist or new concerns arise.  Patient Instructions  Antibiotic and  prednisone burst  To help sinus infection and  Wheezing.  sending in  symbicort and   dupneb for your nebulizer  For now   Get flu inj when off steroid   Call for flu injection only  Day before usually ok .       Standley Brooking. Panosh M.D.

## 2017-01-23 NOTE — Patient Instructions (Addendum)
Antibiotic and  prednisone burst  To help sinus infection and  Wheezing.  sending in  symbicort and   dupneb for your nebulizer  For now   Get flu inj when off steroid   Call for flu injection only  Day before usually ok .

## 2017-02-24 ENCOUNTER — Other Ambulatory Visit: Payer: Self-pay | Admitting: Internal Medicine

## 2017-05-03 ENCOUNTER — Ambulatory Visit (INDEPENDENT_AMBULATORY_CARE_PROVIDER_SITE_OTHER): Payer: Medicare HMO | Admitting: Family Medicine

## 2017-05-03 DIAGNOSIS — Z23 Encounter for immunization: Secondary | ICD-10-CM

## 2017-05-15 ENCOUNTER — Ambulatory Visit: Payer: Medicare HMO | Admitting: Internal Medicine

## 2017-06-05 ENCOUNTER — Telehealth: Payer: Self-pay | Admitting: Family Medicine

## 2017-06-05 NOTE — Telephone Encounter (Signed)
Copied from [place] [id number]. Topic: General - Other >> Jun 05, 2017 12:09 PM Bryahna Stare wrote:  Roderic Scarce with [facility] call to verify if a medical records req has benn received.He said he faxed it over on 06/04/17 .  Would like a call back [phone] ext [phone]     >> Jun 05, 2017  1:07 PM Nimmons, Emilio Math, RN wrote: I checked with medical records here in our office and we have not received form yet.

## 2017-06-06 NOTE — Telephone Encounter (Signed)
Called Cornelius with [facility] - we have not received a medical records request of any type.  He is going to refax this to our office. Will hold in my box to keep an eye out and will also route to [place] to help keep an eye out -- this will likely have to be forwarded over to medical records for them to process.

## 2017-06-13 NOTE — Telephone Encounter (Signed)
Per Joycelyn Schmid this has been received and faxed to [facility]. Nothing further needed.

## 2017-06-18 ENCOUNTER — Telehealth: Payer: Self-pay | Admitting: Internal Medicine

## 2017-06-18 NOTE — Telephone Encounter (Signed)
Copied from [place] [id number]. Topic: Quick Communication - See Telephone Encounter >> Jun 18, 2017 11:02 AM Rutherford Nail, NT wrote: CRM for notification. See Telephone encounter for: 06/18/17. Sophia Fatima Sanger calling from [facility] in regards to medical records requests that was sent on 06/13/17. States the patient is in a medication study and they need the patient's complete record. Please advise. CB#: [phone]   Fax#: [phone]

## 2017-06-18 NOTE — Telephone Encounter (Signed)
I left a voice message for Samantha Hinton to call [facility] [phone] to discuss what else is needed.

## 2017-06-24 NOTE — Progress Notes (Signed)
Chief Complaint  Patient presents with  . Medication Management    Discuss med changes    HPI: Samantha Hinton 62 y.o. come in for Chronic disease management  Would  Like to be in a study but has to be off   Beta blocker.    BP doing ok     High normal   eligible for gerd study has gerd sx and sometimes waterbrash  Has underlying COPD  Stable using symbicort  As needed .   ROS: See pertinent positives and negatives per HPI.  Past Medical History:  Diagnosis Date  . Chronic back pain    buldging disc and tumor.Spondylolisthesis  . COPD (chronic obstructive pulmonary disease) (HCC)    Symbicort daily and ProAir as needed  . Emphysema   . Emphysema of lung ([place])   . Fibromyalgia   . Genital warts   . GERD (gastroesophageal reflux disease)    takes Omeprazole daily  . History of bronchitis 01/2014  . History of colon polyps   . Hyperlipidemia    has been off of meds d/t getting sick from them.Not been addressed again.   Marland Kitchen Hypertension    takes Ziac daily  . Nocturia   . Pneumonia    hx of-8+yrs ago  . PONV (postoperative nausea and vomiting)   . Shortness of breath dyspnea    thinks d/t pain meds and notices with exertion  . Stroke [facility])    16 yrs ago--left eye     Family History  Problem Relation Age of Onset  . CAD Father        tumor between heart and lung   . Hypertension Father   . CAD Sister        ? dx   . Hypertension Mother   . Lung cancer Mother        died 41   . Diabetes Maternal Grandmother        late 41s     Social History   Socioeconomic History  . Marital status: Widowed    Spouse name: Not on file  . Number of children: 2  . Years of education: Not on file  . Highest education level: Not on file  Occupational History  . Occupation: retired    Fish farm manager: Scientist, research (physical sciences)  Social Needs  . Financial resource strain: Not on file  . Food insecurity:    Worry: Not on file    Inability: Not on file  . Transportation needs:    Medical: Not  on file    Non-medical: Not on file  Tobacco Use  . Smoking status: Former Smoker    Packs/day: 0.25    Years: 44.00    Pack years: 11.00    Types: Cigarettes    Last attempt to quit: 02/04/2014    Years since quitting: 3.3  . Smokeless tobacco: Never Used  . Tobacco comment: quit smoking in Dec 2015  Substance and Sexual Activity  . Alcohol use: Yes    Alcohol/week: 2.4 oz    Types: 4 Standard drinks or equivalent per week    Comment: couple times a week  . Drug use: No  . Sexual activity: Not on file  Lifestyle  . Physical activity:    Days per week: Not on file    Minutes per session: Not on file  . Stress: Not on file  Relationships  . Social connections:    Talks on phone: Not on file    Gets together: Not  on file    Attends religious service: Not on file    Active member of club or organization: Not on file    Attends meetings of clubs or organizations: Not on file    Relationship status: Not on file  Other Topics Concern  . Not on file  Social History Narrative   6-8 hours of sleep per night   Lives with her fiance hh of 2 no pets    1 dog in the home   Retired no ets  But tob 8 per day   etoh ocass 1-2    On disability from her neck surgery predicaments .   orig from [employer] in [place] area.    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 2   Last Pap 2006 last mammogram 2000 and   Has dentures   FA     Outpatient Medications Prior to Visit  Medication Sig Dispense Refill  . budesonide-formoterol (SYMBICORT) 160-4.5 MCG/ACT inhaler Inhale 2 puffs into the lungs 2 (two) times daily.    . cetirizine (ZYRTEC) 10 MG tablet Take 10 mg by mouth daily.    . Multiple Vitamins-Minerals (CENTRUM PO) Take 1 tablet by mouth daily.     Marland Kitchen omeprazole (PRILOSEC) 20 MG capsule TAKE 1 CAPSULE BY MOUTH ONCE DAILY 90 capsule 2  . bisoprolol-hydrochlorothiazide (ZIAC) 5-6.25 MG tablet TAKE 1 TABLET BY MOUTH ONCE DAILY 90 tablet 1  . amoxicillin-clavulanate  (AUGMENTIN) 875-125 MG tablet Take 1 tablet by mouth every 12 (twelve) hours. (Patient not taking: Reported on 06/25/2017) 14 tablet 0  . budesonide-formoterol (SYMBICORT) 160-4.5 MCG/ACT inhaler Inhale 2 puffs into the lungs 2 (two) times daily. (Patient not taking: Reported on 06/25/2017) 1 Inhaler 3  . ipratropium-albuterol (DUONEB) 0.5-2.5 (3) MG/3ML SOLN Take 3 mLs by nebulization every 6 (six) hours as needed. (Patient not taking: Reported on 06/25/2017) 360 mL 2  . predniSONE (DELTASONE) 20 MG tablet Take 3 po qd for 2 days then 2 po qd for 3 days,or as directed (Patient not taking: Reported on 06/25/2017) 12 tablet 0   No facility-administered medications prior to visit.      EXAM:  BP (!) 144/90 (BP Location: Left Arm, Patient Position: Sitting, Cuff Size: Large)   Pulse 93   Temp 98.3 F (36.8 C) (Oral)   Wt 212 lb 14.4 oz (96.6 kg)   BMI 34.89 kg/m   Body mass index is 34.89 kg/m.  GENERAL: vitals reviewed and listed above, alert, oriented, appears well hydrated and in no acute distress HEENT: atraumatic, conjunctiva  clear, no obvious abnormalities on inspection of external nose and ears NECK: no obvious masses on inspection palpation  LUNGS:cta  Rare wheeze dec bs  No retraction  CV: HRRR, no clubbing cyanosis slight edema peripheral edema nl cap refill  MS: moves all extremities without noticeable focal  abnormality PSYCH: pleasant and cooperative, no obvious depression or anxiety  BP Readings from Last 3 Encounters:  06/25/17 (!) 144/90  01/23/17 (!) 152/92  07/25/16 130/84   Wt Readings from Last 3 Encounters:  06/25/17 212 lb 14.4 oz (96.6 kg)  01/23/17 202 lb 9.6 oz (91.9 kg)  07/25/16 196 lb (88.9 kg)     ASSESSMENT AND PLAN:  Discussed the following assessment and plan:  Essential hypertension, benign - stop ziac change to arb hctz has been onlsi in remote past but want to avoid risk of cough  Medication management  Gastroesophageal reflux disease,  esophagitis presence not  specified  COPD GOLD II Due cpx in june -Patient advised to return or notify health care team  if  new concerns arise.  Patient Instructions  Ok to change BP medication .    Stop the   ziac  And change to  Losartan hctz 50/12.5    Sent to your pharmacy   It may take  Weeks 3-4 to get full transition of  Effect .    Plan cpx and will do labs  In JUne 2019   Fasting  At the visit .      Standley Brooking. Panosh M.D.

## 2017-06-25 ENCOUNTER — Ambulatory Visit (INDEPENDENT_AMBULATORY_CARE_PROVIDER_SITE_OTHER): Payer: Medicare HMO | Admitting: Internal Medicine

## 2017-06-25 ENCOUNTER — Encounter: Payer: Self-pay | Admitting: Internal Medicine

## 2017-06-25 VITALS — BP 144/90 | HR 93 | Temp 98.3°F | Wt 212.9 lb

## 2017-06-25 DIAGNOSIS — K219 Gastro-esophageal reflux disease without esophagitis: Secondary | ICD-10-CM

## 2017-06-25 DIAGNOSIS — Z79899 Other long term (current) drug therapy: Secondary | ICD-10-CM | POA: Diagnosis not present

## 2017-06-25 DIAGNOSIS — J449 Chronic obstructive pulmonary disease, unspecified: Secondary | ICD-10-CM | POA: Diagnosis not present

## 2017-06-25 DIAGNOSIS — I10 Essential (primary) hypertension: Secondary | ICD-10-CM

## 2017-06-25 MED ORDER — LOSARTAN POTASSIUM-HCTZ 50-12.5 MG PO TABS: 1.0000 | ORAL_TABLET | Freq: Every day | ORAL | 3 refills | Status: DC

## 2017-06-25 NOTE — Patient Instructions (Addendum)
Ok to change BP medication .    Stop the   ziac  And change to  Losartan hctz 50/12.5    Sent to your pharmacy   It may take  Weeks 3-4 to get full transition of  Effect .    Plan cpx and will do labs  In JUne 2019   Fasting  At the visit .

## 2017-07-08 ENCOUNTER — Other Ambulatory Visit: Payer: Self-pay | Admitting: Internal Medicine

## 2017-07-08 DIAGNOSIS — R110 Nausea: Secondary | ICD-10-CM

## 2017-07-26 ENCOUNTER — Encounter: Payer: Medicare HMO | Admitting: Internal Medicine

## 2017-07-31 ENCOUNTER — Telehealth: Payer: Self-pay | Admitting: Internal Medicine

## 2017-07-31 MED ORDER — HYDROCHLOROTHIAZIDE 12.5 MG PO TABS: 12.5000 mg | ORAL_TABLET | Freq: Every day | ORAL | 0 refills | Status: DC

## 2017-07-31 MED ORDER — LOSARTAN POTASSIUM 50 MG PO TABS: 50.0000 mg | ORAL_TABLET | Freq: Every day | ORAL | 0 refills | Status: DC

## 2017-07-31 NOTE — Telephone Encounter (Signed)
Medications filled to pharmacy as requested.  

## 2017-07-31 NOTE — Telephone Encounter (Signed)
Ok to change to separate  losartana nd hctz same mg dose 90 days ok

## 2017-07-31 NOTE — Telephone Encounter (Signed)
Copied from [place] [id number]. Topic: Quick Communication - Rx Refill/Question >> Jul 31, 2017 12:35 PM Waylan Rocher, Lumin L wrote: Medication: losartan-hydrochlorothiazide (HYZAAR) 50-12.5 MG tablet (on backorder and [facility] has been faxing to get something else called in)  Has the patient contacted their pharmacy? Yes.   (Agent: If no, request that the patient contact the pharmacy for the refill.) (Agent: If yes, when and what did the pharmacy advise?)  Preferred Pharmacy (with phone number or street name): [place], [place] - [address] [place] [place] [address] Phone: [phone] Fax: [phone]  Agent: Please be advised that RX refills may take up to 3 business days. We ask that you follow-up with your pharmacy.  Patient out of medication. Please call patient once alternate is sent.

## 2017-07-31 NOTE — Addendum Note (Signed)
Addended by: Dorrene German on: 07/31/2017 01:26 PM   Modules accepted: Orders

## 2017-07-31 NOTE — Telephone Encounter (Signed)
Could we split med into separate losartan and HCTZ of same doses instead of changing meds?

## 2017-08-09 NOTE — Progress Notes (Signed)
Chief Complaint  Patient presents with  . Annual Exam    HPI: Samantha Hinton 62 y.o. comes in today for Preventive Medicare exam/ wellness visit . And Chronic disease management  BP  Didn't get to study and feels had se of losartan hctz  Leg heavy and achy  And back pain   Better off    bp 134/80 range   At home sometimes 140 range   Fell dpown to sstop right ankle a mointh ago still bothering her and tender  Lateral  Wears flip flots poss remote hx of injury years ago? Still impming but better  Copd the same but inc prob in hit humid weather  Not seeing pulm requests rescue inhaler if needed .   Has gained weight plan going back on dietary changes . To hlep all   Has gerd ? batrrets ?  On ppi    Health Maintenance  Topic Date Due  . PAP SMEAR  02/20/2007  . COLONOSCOPY  02/20/2011  . MAMMOGRAM  07/02/2015  . INFLUENZA VACCINE  09/19/2017  . TETANUS/TDAP  05/23/2024  . Hepatitis C Screening  Completed  . HIV Screening  Completed   Health Maintenance Review LIFESTYLE:  Exercise:  limted at this time Tobacco/ETS:n Alcohol:  None to many  At a time. None for a month  Sugar beverages:no  Powder creamer in coffee Sleep:5-8 Drug use: no HH:   2     2 dogs    Hearing: ok  Vision:  No limitations at present .no recent eye check readers   Safety:  Has smoke detector and wears seat belts.  No excess sun exposure. Falls: n  Preventive parameters:  Reviewed   ADLS:   There are no problems or need for assistance  driving, feeding, obtaining food, dressing, toileting and bathing, managing money using phone. She is independent.    ROS:  GEN/ HEENT: No fever, significant weight changes sweats headaches vision problems hearing changes, CV/ PULM; No chest pain shortness of breath cough, syncope,edema  change in exercise tolerance. GI /GU: No adominal pain, vomiting, change in bowel habits. No blood in the stool. No significant GU symptoms. SKIN/HEME: ,no acute skin rashes  suspicious lesions or bleeding. No lymphadenopathy, nodules, masses.  NEURO/ PSYCH:  No neurologic signs such as weakness numbness. No depression anxiety. IMM/ Allergy: No unusual infections.  Allergy .   REST of 12 system review negative except as per HPI   Past Medical History:  Diagnosis Date  . Chronic back pain    buldging disc and tumor.Spondylolisthesis  . COPD (chronic obstructive pulmonary disease) (HCC)    Symbicort daily and ProAir as needed  . Emphysema   . Emphysema of lung ([place])   . Fibromyalgia   . Genital warts   . GERD (gastroesophageal reflux disease)    takes Omeprazole daily  . History of bronchitis 01/2014  . History of colon polyps   . Hyperlipidemia    has been off of meds d/t getting sick from them.Not been addressed again.   Marland Kitchen Hypertension    takes Ziac daily  . Nocturia   . Pneumonia    hx of-8+yrs ago  . PONV (postoperative nausea and vomiting)   . Shortness of breath dyspnea    thinks d/t pain meds and notices with exertion  . 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 CAD Father  tumor between heart and lung   . Hypertension Father   . CAD Sister        ? dx   . Hypertension Mother   . Lung cancer Mother        died 77   . Diabetes Maternal Grandmother        late 28s     Social History   Socioeconomic History  . Marital status: Widowed    Spouse name: Not on file  . Number of children: 2  . Years of education: Not on file  . Highest education level: Not on file  Occupational History  . Occupation: retired    Fish farm manager: Scientist, research (physical sciences)  Social Needs  . Financial resource strain: Not on file  . Food insecurity:    Worry: Not on file    Inability: Not on file  . Transportation needs:    Medical: Not on file    Non-medical: Not on file  Tobacco Use  . Smoking status: Former Smoker    Packs/day: 0.25    Years: 44.00    Pack years: 11.00    Types: Cigarettes    Last attempt  to quit: 02/04/2014    Years since quitting: 3.5  . Smokeless tobacco: Never Used  . Tobacco comment: quit smoking in Dec 2015  Substance and Sexual Activity  . Alcohol use: Yes    Alcohol/week: 2.4 oz    Types: 4 Standard drinks or equivalent per week    Comment: couple times a week  . Drug use: No  . Sexual activity: Not on file  Lifestyle  . Physical activity:    Days per week: Not on file    Minutes per session: Not on file  . Stress: Not on file  Relationships  . Social connections:    Talks on phone: Not on file    Gets together: Not on file    Attends religious service: Not on file    Active member of club or organization: Not on file    Attends meetings of clubs or organizations: Not on file    Relationship status: Not on file  Other Topics Concern  . Not on file  Social History Narrative   6-8 hours of sleep per night   Lives with her fiance hh of 2 no pets    1 dog in the home   Retired no ets  But tob 8 per day   etoh ocass 1-2    On disability from her neck surgery predicaments .   orig from [employer] in [place] area.    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 2   Last Pap 2006 last mammogram 2000 and   Has dentures   FA       EXAM:  BP (!) 154/88   Pulse 82   Temp 98.7 F (37.1 C)   Ht 5' 4.75" (1.645 m)   Wt 212 lb (96.2 kg)   BMI 35.55 kg/m   Body mass index is 35.55 kg/m.  Physical Exam: Vital signs reviewed WNU:UVOZ is a well-developed well-nourished alert cooperative   who appears stated age in no acute distress.  HEENT: normocephalic atraumatic , Eyes: PERRL EOM's full, conjunctiva clear, Nares: paten,t no deformity discharge or tenderness., Ears: no deformity EAC's clear TMs with normal landmarks. Mouth: clear OP, no lesions, edema.  Moist mucous membranes. Dentition in adequate repair. NECK: supple without masses, thyromegaly or bruits. CHEST/PULM:  Clear to auscultation and percussion  breath sounds equal no wheeze  , rales or rhonchi. No chest wall deformities or tenderness. CV: PMI is nondisplaced, S1 S2 no gallops, murmurs, rubs. Peripheral pulses are full without delay.No JVD . Breast: normal by inspection . No dimpling, discharge, masses, tenderness or discharge . ABDOMEN: Bowel sounds normal nontender  No guard or rebound, no hepato splenomegal no CVA tenderness.   Extremtities:  No clubbing cyanosis or edema, right ankle lateral tenderness  No bony tenderness  Sort swelling  Superior to lateral ankle  nv seem ok NEURO:  Oriented x3, cranial nerves 3-12 appear to be intact, no obvious focal weakness,gait within mild limp SKIN: No acute rashes normal turgor, color, no bruising or petechiae. Sun changes  PSYCH: Oriented, good eye contact, no obvious depression anxiety, cognition and judgment appear normal. LN: no cervical axillary inguinal adenopathy No noted deficits in memory, attention, and speech.   Lab Results  Component Value Date   WBC 7.3 08/12/2017   HGB 13.8 08/12/2017   HCT 41.7 08/12/2017   PLT 248.0 08/12/2017   GLUCOSE 96 08/12/2017   CHOL 246 (H) 08/12/2017   TRIG 221.0 (H) 08/12/2017   HDL 57.20 08/12/2017   LDLDIRECT 152.0 08/12/2017   LDLCALC 116 (H) 05/24/2014   ALT 40 (H) 08/12/2017   AST 23 08/12/2017   NA 140 08/12/2017   K 4.3 08/12/2017   CL 100 08/12/2017   CREATININE 0.74 08/12/2017   BUN 17 08/12/2017   CO2 29 08/12/2017   TSH 0.95 08/12/2017   Allergies as of 08/12/2017      Reactions   Wellbutrin [bupropion] Nausea And Vomiting      Medication List        Accurate as of 08/12/17  7:40 PM. Always use your most recent med list.          albuterol 108 (90 Base) MCG/ACT inhaler Commonly known as:  PROVENTIL HFA;VENTOLIN HFA Inhale 1-2 puffs into the lungs every 6 (six) hours as needed for wheezing or shortness of breath. Avoid regular use   budesonide-formoterol 160-4.5 MCG/ACT inhaler Commonly known as:  SYMBICORT Inhale 2 puffs into the lungs 2 (two)  times daily. As needed   CENTRUM PO Take 1 tablet by mouth daily.   cetirizine 10 MG tablet Commonly known as:  ZYRTEC Take 10 mg by mouth daily.   omeprazole 20 MG capsule Commonly known as:  PRILOSEC TAKE 1 CAPSULE BY MOUTH ONCE DAILY   Zoster Vaccine Adjuvanted injection Commonly known as:  SHINGRIX Inject 0.5 mLs into the muscle once for 1 dose. Repeat in 2-6 months       ASSESSMENT AND PLAN:  Discussed the following assessment and plan:  Visit for preventive health examination  Medication management - Plan: Basic metabolic panel, CBC with Differential/Platelet, Hepatic function panel, Lipid panel, TSH  COPD GOLD II - Plan: Basic metabolic panel, CBC with Differential/Platelet, Hepatic function panel, Lipid panel, TSH  Essential hypertension, benign - back on bisoprolol hct 5/6.25 see text may need intensificaiont  had se ? arb?hctz - Plan: Basic metabolic panel, CBC with Differential/Platelet, Hepatic function panel, Lipid panel, TSH  Hyperlipidemia, unspecified hyperlipidemia type - Plan: Basic metabolic panel, CBC with Differential/Platelet, Hepatic function panel, Lipid panel, TSH  Right ankle pain, unspecified chronicity - Plan: Ambulatory referral to Sports Medicine, Basic metabolic panel, CBC with Differential/Platelet, Hepatic function panel, Lipid panel, TSH  Colon cancer screening - Plan: Ambulatory referral to Gastroenterology  Spondylolisthesis of lumbar region New meds often give her side effects  (  per patient   Observation)  Need to  Get bp control decides and lose weigh t again  . Pt wasn't to do lsi and recluctant to change meds at this time .  Says  Losartan hctz cause leg and back pain?  Says not seeing  Pulmonary any more unless needed.  Will add rescue as needed  With caution.  Refer for  Colon.  Lab today  Fu 2 mos with monitor and  Fu BP control etc .  Advise eye exam  Patient Care Team: Marqus Macphee, Standley Brooking, MD as PCP - General (Internal  Medicine) Tanda Rockers, MD as Consulting Physician (Pulmonary Disease) Inda Castle, MD (Inactive) as Consulting Physician (Gastroenterology) Karie Chimera, MD as Consulting Physician (Neurosurgery)  Patient Instructions   bp goal is 120/80 but  Below  140/90  Is necessary.  Continue lifestyle intervention healthy eating and exercise . Ok to go back on  You med bisoprolol/ hctz 5/6.25 but may not be poserful enough  To control BP to goal .   Check bp readings  Twice a day for 5-7 days record    If 160 and over contact us for advise  Intensify lifestyle interventions. No processed carbs for 3 weeks  And  Limit alcohol   No calories in beverages x  Low fat milk.  Wt Readings from Last 3 Encounters:  08/12/17 212 lb (96.2 kg)  06/25/17 212 lb 14.4 oz (96.6 kg)  01/23/17 202 lb 9.6 oz (91.9 kg)      Will do referral for routine colonoscopy  Get your mammogram .   will send in a rescue medicine but caution with regular use.   Get an eye check to make sure no incipient eye disease   ROV in 2 months with BP readings and  Your monitor   Health Maintenance, Female Adopting a healthy lifestyle and getting preventive care can go a long way to promote health and wellness. Talk with your health care provider about what schedule of regular examinations is right for you. This is a good chance for you to check in with your provider about disease prevention and staying healthy. In between checkups, there are plenty of things you can do on your own. Experts have done a lot of research about which lifestyle changes and preventive measures are most likely to keep you healthy. Ask your health care provider for more information. Weight and diet Eat a healthy diet  Be sure to include plenty of vegetables, fruits, low-fat dairy products, and lean protein.  Do not eat a lot of foods high in solid fats, added sugars, or salt.  Get regular exercise. This is one of the most important things you  can do for your health. ? Most adults should exercise for at least 150 minutes each week. The exercise should increase your heart rate and make you sweat (moderate-intensity exercise). ? Most adults should also do strengthening exercises at least twice a week. This is in addition to the moderate-intensity exercise.  Maintain a healthy weight  Body mass index (BMI) is a measurement that can be used to identify possible weight problems. It estimates body fat based on height and weight. Your health care provider can help determine your BMI and help you achieve or maintain a healthy weight.  For females 30 years of age and older: ? A BMI below 18.5 is considered underweight. ? A BMI of 18.5 to 24.9 is normal. ? A BMI of 25 to 29.9 is considered  overweight. ? A BMI of 30 and above is considered obese.  Watch levels of cholesterol and blood lipids  You should start having your blood tested for lipids and cholesterol at 62 years of age, then have this test every 5 years.  You may need to have your cholesterol levels checked more often if: ? Your lipid or cholesterol levels are high. ? You are older than 62 years of age. ? You are at high risk for heart disease.  Cancer screening Lung Cancer  Lung cancer screening is recommended for adults 72-32 years old who are at high risk for lung cancer because of a history of smoking.  A yearly low-dose CT scan of the lungs is recommended for people who: ? Currently smoke. ? Have quit within the past 15 years. ? Have at least a 30-pack-year history of smoking. A pack year is smoking an average of one pack of cigarettes a day for 1 year.  Yearly screening should continue until it has been 15 years since you quit.  Yearly screening should stop if you develop a health problem that would prevent you from having lung cancer treatment.  Breast Cancer  Practice breast self-awareness. This means understanding how your breasts normally appear and  feel.  It also means doing regular breast self-exams. Let your health care provider know about any changes, no matter how small.  If you are in your 20s or 30s, you should have a clinical breast exam (CBE) by a health care provider every 1-3 years as part of a regular health exam.  If you are 8 or older, have a CBE every year. Also consider having a breast X-ray (mammogram) every year.  If you have a family history of breast cancer, talk to your health care provider about genetic screening.  If you are at high risk for breast cancer, talk to your health care provider about having an MRI and a mammogram every year.  Breast cancer gene (BRCA) assessment is recommended for women who have family members with BRCA-related cancers. BRCA-related cancers include: ? Breast. ? Ovarian. ? Tubal. ? Peritoneal cancers.  Results of the assessment will determine the need for genetic counseling and BRCA1 and BRCA2 testing.  Cervical Cancer Your health care provider may recommend that you be screened regularly for cancer of the pelvic organs (ovaries, uterus, and vagina). This screening involves a pelvic examination, including checking for microscopic changes to the surface of your cervix (Pap test). You may be encouraged to have this screening done every 3 years, beginning at age 47.  For women ages 67-65, health care providers may recommend pelvic exams and Pap testing every 3 years, or they may recommend the Pap and pelvic exam, combined with testing for human papilloma virus (HPV), every 5 years. Some types of HPV increase your risk of cervical cancer. Testing for HPV may also be done on women of any age with unclear Pap test results.  Other health care providers may not recommend any screening for nonpregnant women who are considered low risk for pelvic cancer and who do not have symptoms. Ask your health care provider if a screening pelvic exam is right for you.  If you have had past treatment for  cervical cancer or a condition that could lead to cancer, you need Pap tests and screening for cancer for at least 20 years after your treatment. If Pap tests have been discontinued, your risk factors (such as having a new sexual partner) need to be reassessed to  determine if screening should resume. Some women have medical problems that increase the chance of getting cervical cancer. In these cases, your health care provider may recommend more frequent screening and Pap tests.  Colorectal Cancer  This type of cancer can be detected and often prevented.  Routine colorectal cancer screening usually begins at 62 years of age and continues through 62 years of age.  Your health care provider may recommend screening at an earlier age if you have risk factors for colon cancer.  Your health care provider may also recommend using home test kits to check for hidden blood in the stool.  A small camera at the end of a tube can be used to examine your colon directly (sigmoidoscopy or colonoscopy). This is done to check for the earliest forms of colorectal cancer.  Routine screening usually begins at age 30.  Direct examination of the colon should be repeated every 5-10 years through 62 years of age. However, you may need to be screened more often if early forms of precancerous polyps or small growths are found.  Skin Cancer  Check your skin from head to toe regularly.  Tell your health care provider about any new moles or changes in moles, especially if there is a change in a mole's shape or color.  Also tell your health care provider if you have a mole that is larger than the size of a pencil eraser.  Always use sunscreen. Apply sunscreen liberally and repeatedly throughout the day.  Protect yourself by wearing long sleeves, pants, a wide-brimmed hat, and sunglasses whenever you are outside.  Heart disease, diabetes, and high blood pressure  High blood pressure causes heart disease and increases  the risk of stroke. High blood pressure is more likely to develop in: ? People who have blood pressure in the high end of the normal range (130-139/85-89 mm Hg). ? People who are overweight or obese. ? People who are African American.  If you are 88-48 years of age, have your blood pressure checked every 3-5 years. If you are 32 years of age or older, have your blood pressure checked every year. You should have your blood pressure measured twice-once when you are at a hospital or clinic, and once when you are not at a hospital or clinic. Record the average of the two measurements. To check your blood pressure when you are not at a hospital or clinic, you can use: ? An automated blood pressure machine at a pharmacy. ? A home blood pressure monitor.  If you are between 22 years and 59 years old, ask your health care provider if you should take aspirin to prevent strokes.  Have regular diabetes screenings. This involves taking a blood sample to check your fasting blood sugar level. ? If you are at a normal weight and have a low risk for diabetes, have this test once every three years after 62 years of age. ? If you are overweight and have a high risk for diabetes, consider being tested at a younger age or more often. Preventing infection Hepatitis B  If you have a higher risk for hepatitis B, you should be screened for this virus. You are considered at high risk for hepatitis B if: ? You were born in a country where hepatitis B is common. Ask your health care provider which countries are considered high risk. ? Your parents were born in a high-risk country, and you have not been immunized against hepatitis B (hepatitis B vaccine). ? You  have HIV or AIDS. ? You use needles to inject street drugs. ? You live with someone who has hepatitis B. ? You have had sex with someone who has hepatitis B. ? You get hemodialysis treatment. ? You take certain medicines for conditions, including cancer, organ  transplantation, and autoimmune conditions.  Hepatitis C  Blood testing is recommended for: ? Everyone born from [birth year] through 1965. ? Anyone with known risk factors for hepatitis C.  Sexually transmitted infections (STIs)  You should be screened for sexually transmitted infections (STIs) including gonorrhea and chlamydia if: ? You are sexually active and are younger than 62 years of age. ? You are older than 62 years of age and your health care provider tells you that you are at risk for this type of infection. ? Your sexual activity has changed since you were last screened and you are at an increased risk for chlamydia or gonorrhea. Ask your health care provider if you are at risk.  If you do not have HIV, but are at risk, it may be recommended that you take a prescription medicine daily to prevent HIV infection. This is called pre-exposure prophylaxis (PrEP). You are considered at risk if: ? You are sexually active and do not regularly use condoms or know the HIV status of your partner(s). ? You take drugs by injection. ? You are sexually active with a partner who has HIV.  Talk with your health care provider about whether you are at high risk of being infected with HIV. If you choose to begin PrEP, you should first be tested for HIV. You should then be tested every 3 months for as long as you are taking PrEP. Pregnancy  If you are premenopausal and you may become pregnant, ask your health care provider about preconception counseling.  If you may become pregnant, take 400 to 800 micrograms (mcg) of folic acid every day.  If you want to prevent pregnancy, talk to your health care provider about birth control (contraception). Osteoporosis and menopause  Osteoporosis is a disease in which the bones lose minerals and strength with aging. This can result in serious bone fractures. Your risk for osteoporosis can be identified using a bone density scan.  If you are 30 years of age or  older, or if you are at risk for osteoporosis and fractures, ask your health care provider if you should be screened.  Ask your health care provider whether you should take a calcium or vitamin D supplement to lower your risk for osteoporosis.  Menopause may have certain physical symptoms and risks.  Hormone replacement therapy may reduce some of these symptoms and risks. Talk to your health care provider about whether hormone replacement therapy is right for you. Follow these instructions at home:  Schedule regular health, dental, and eye exams.  Stay current with your immunizations.  Do not use any tobacco products including cigarettes, chewing tobacco, or electronic cigarettes.  If you are pregnant, do not drink alcohol.  If you are breastfeeding, limit how much and how often you drink alcohol.  Limit alcohol intake to no more than 1 drink per day for nonpregnant women. One drink equals 12 ounces of beer, 5 ounces of wine, or 1 ounces of hard liquor.  Do not use street drugs.  Do not share needles.  Ask your health care provider for help if you need support or information about quitting drugs.  Tell your health care provider if you often feel depressed.  Tell your health  care provider if you have ever been abused or do not feel safe at home. This information is not intended to replace advice given to you by your health care provider. Make sure you discuss any questions you have with your health care provider. Document Released: 08/21/2010 Document Revised: 07/14/2015 Document Reviewed: 11/09/2014 Elsevier Interactive Patient Education  2018 [employer]. Kim Oki M.D.

## 2017-08-12 ENCOUNTER — Ambulatory Visit (INDEPENDENT_AMBULATORY_CARE_PROVIDER_SITE_OTHER): Payer: Medicare HMO | Admitting: Internal Medicine

## 2017-08-12 ENCOUNTER — Encounter: Payer: Self-pay | Admitting: Gastroenterology

## 2017-08-12 ENCOUNTER — Encounter: Payer: Self-pay | Admitting: Internal Medicine

## 2017-08-12 VITALS — BP 154/88 | HR 82 | Temp 98.7°F | Ht 64.75 in | Wt 212.0 lb

## 2017-08-12 DIAGNOSIS — M4316 Spondylolisthesis, lumbar region: Secondary | ICD-10-CM

## 2017-08-12 DIAGNOSIS — E785 Hyperlipidemia, unspecified: Secondary | ICD-10-CM

## 2017-08-12 DIAGNOSIS — Z0000 Encounter for general adult medical examination without abnormal findings: Secondary | ICD-10-CM

## 2017-08-12 DIAGNOSIS — J449 Chronic obstructive pulmonary disease, unspecified: Secondary | ICD-10-CM | POA: Diagnosis not present

## 2017-08-12 DIAGNOSIS — Z1211 Encounter for screening for malignant neoplasm of colon: Secondary | ICD-10-CM | POA: Diagnosis not present

## 2017-08-12 DIAGNOSIS — M25571 Pain in right ankle and joints of right foot: Secondary | ICD-10-CM | POA: Diagnosis not present

## 2017-08-12 DIAGNOSIS — Z79899 Other long term (current) drug therapy: Secondary | ICD-10-CM | POA: Diagnosis not present

## 2017-08-12 DIAGNOSIS — I10 Essential (primary) hypertension: Secondary | ICD-10-CM

## 2017-08-12 LAB — BASIC METABOLIC PANEL
BUN: 17 mg/dL (ref 6–23)
CHLORIDE: 100 meq/L (ref 96–112)
CO2: 29 mEq/L (ref 19–32)
Calcium: 9.6 mg/dL (ref 8.4–10.5)
Creatinine, Ser: 0.74 mg/dL (ref 0.40–1.20)
GFR: 84.6 mL/min (ref >60.00)
Glucose, Bld: 96 mg/dL (ref 70–99)
POTASSIUM: 4.3 meq/L (ref 3.5–5.1)
Sodium: 140 mEq/L (ref 135–145)

## 2017-08-12 LAB — CBC WITH DIFFERENTIAL/PLATELET
BASOS PCT: 0.6 % (ref 0.0–3.0)
Basophils Absolute: 0 10*3/uL (ref 0.0–0.1)
EOS PCT: 1 % (ref 0.0–5.0)
Eosinophils Absolute: 0.1 10*3/uL (ref 0.0–0.7)
HEMATOCRIT: 41.7 % (ref 36.0–46.0)
HEMOGLOBIN: 13.8 g/dL (ref 12.0–15.0)
Lymphocytes Relative: 24.5 % (ref 12.0–46.0)
Lymphs Abs: 1.8 10*3/uL (ref 0.7–4.0)
MCHC: 33.1 g/dL (ref 30.0–36.0)
MCV: 89.6 fl (ref 78.0–100.0)
MONO ABS: 0.4 10*3/uL (ref 0.1–1.0)
MONOS PCT: 6.1 % (ref 3.0–12.0)
Neutro Abs: 4.9 10*3/uL (ref 1.4–7.7)
Neutrophils Relative %: 67.8 % (ref 43.0–77.0)
Platelets: 248 10*3/uL (ref 150.0–400.0)
RBC: 4.65 Mil/uL (ref 3.87–5.11)
RDW: 13.5 % (ref 11.5–15.5)
WBC: 7.3 10*3/uL (ref 4.0–10.5)

## 2017-08-12 LAB — LIPID PANEL
CHOLESTEROL: 246 mg/dL — AB (ref 0–200)
HDL: 57.2 mg/dL (ref >39.00)
NonHDL: 188.95
TRIGLYCERIDES: 221 mg/dL — AB (ref 0.0–149.0)
Total CHOL/HDL Ratio: 4
VLDL: 44.2 mg/dL — ABNORMAL HIGH (ref 0.0–40.0)

## 2017-08-12 LAB — HEPATIC FUNCTION PANEL
ALBUMIN: 4.4 g/dL (ref 3.5–5.2)
ALT: 40 U/L — AB (ref 0–35)
AST: 23 U/L (ref 0–37)
Alkaline Phosphatase: 60 U/L (ref 39–117)
BILIRUBIN TOTAL: 0.6 mg/dL (ref 0.2–1.2)
Bilirubin, Direct: 0.1 mg/dL (ref 0.0–0.3)
TOTAL PROTEIN: 7.1 g/dL (ref 6.0–8.3)

## 2017-08-12 LAB — TSH: TSH: 0.95 u[IU]/mL (ref 0.35–4.50)

## 2017-08-12 LAB — LDL CHOLESTEROL, DIRECT: Direct LDL: 152 mg/dL

## 2017-08-12 MED ORDER — ZOSTER VAC RECOMB ADJUVANTED 50 MCG/0.5ML IM SUSR: 0.5000 mL | Freq: Once | INTRAMUSCULAR | 1 refills | Status: AC

## 2017-08-12 MED ORDER — ALBUTEROL SULFATE HFA 108 (90 BASE) MCG/ACT IN AERS: 1.0000 | INHALATION_SPRAY | Freq: Four times a day (QID) | RESPIRATORY_TRACT | 1 refills | Status: DC | PRN

## 2017-08-12 NOTE — Patient Instructions (Addendum)
bp goal is 120/80 but  Below  140/90  Is necessary.  Continue lifestyle intervention healthy eating and exercise . Ok to go back on  You med bisoprolol/ hctz 5/6.25 but may not be poserful enough  To control BP to goal .   Check bp readings  Twice a day for 5-7 days record    If 160 and over contact us for advise  Intensify lifestyle interventions. No processed carbs for 3 weeks  And  Limit alcohol   No calories in beverages x  Low fat milk.  Wt Readings from Last 3 Encounters:  08/12/17 212 lb (96.2 kg)  06/25/17 212 lb 14.4 oz (96.6 kg)  01/23/17 202 lb 9.6 oz (91.9 kg)      Will do referral for routine colonoscopy  Get your mammogram .   will send in a rescue medicine but caution with regular use.   Get an eye check to make sure no incipient eye disease   ROV in 2 months with BP readings and  Your monitor   Health Maintenance, Female Adopting a healthy lifestyle and getting preventive care can go a long way to promote health and wellness. Talk with your health care provider about what schedule of regular examinations is right for you. This is a good chance for you to check in with your provider about disease prevention and staying healthy. In between checkups, there are plenty of things you can do on your own. Experts have done a lot of research about which lifestyle changes and preventive measures are most likely to keep you healthy. Ask your health care provider for more information. Weight and diet Eat a healthy diet  Be sure to include plenty of vegetables, fruits, low-fat dairy products, and lean protein.  Do not eat a lot of foods high in solid fats, added sugars, or salt.  Get regular exercise. This is one of the most important things you can do for your health. ? Most adults should exercise for at least 150 minutes each week. The exercise should increase your heart rate and make you sweat (moderate-intensity exercise). ? Most adults should also do strengthening  exercises at least twice a week. This is in addition to the moderate-intensity exercise.  Maintain a healthy weight  Body mass index (BMI) is a measurement that can be used to identify possible weight problems. It estimates body fat based on height and weight. Your health care provider can help determine your BMI and help you achieve or maintain a healthy weight.  For females 82 years of age and older: ? A BMI below 18.5 is considered underweight. ? A BMI of 18.5 to 24.9 is normal. ? A BMI of 25 to 29.9 is considered overweight. ? A BMI of 30 and above is considered obese.  Watch levels of cholesterol and blood lipids  You should start having your blood tested for lipids and cholesterol at 62 years of age, then have this test every 5 years.  You may need to have your cholesterol levels checked more often if: ? Your lipid or cholesterol levels are high. ? You are older than 62 years of age. ? You are at high risk for heart disease.  Cancer screening Lung Cancer  Lung cancer screening is recommended for adults 32-13 years old who are at high risk for lung cancer because of a history of smoking.  A yearly low-dose CT scan of the lungs is recommended for people who: ? Currently smoke. ? Have quit within  the past 15 years. ? Have at least a 30-pack-year history of smoking. A pack year is smoking an average of one pack of cigarettes a day for 1 year.  Yearly screening should continue until it has been 15 years since you quit.  Yearly screening should stop if you develop a health problem that would prevent you from having lung cancer treatment.  Breast Cancer  Practice breast self-awareness. This means understanding how your breasts normally appear and feel.  It also means doing regular breast self-exams. Let your health care provider know about any changes, no matter how small.  If you are in your 20s or 30s, you should have a clinical breast exam (CBE) by a health care provider  every 1-3 years as part of a regular health exam.  If you are 88 or older, have a CBE every year. Also consider having a breast X-ray (mammogram) every year.  If you have a family history of breast cancer, talk to your health care provider about genetic screening.  If you are at high risk for breast cancer, talk to your health care provider about having an MRI and a mammogram every year.  Breast cancer gene (BRCA) assessment is recommended for women who have family members with BRCA-related cancers. BRCA-related cancers include: ? Breast. ? Ovarian. ? Tubal. ? Peritoneal cancers.  Results of the assessment will determine the need for genetic counseling and BRCA1 and BRCA2 testing.  Cervical Cancer Your health care provider may recommend that you be screened regularly for cancer of the pelvic organs (ovaries, uterus, and vagina). This screening involves a pelvic examination, including checking for microscopic changes to the surface of your cervix (Pap test). You may be encouraged to have this screening done every 3 years, beginning at age 52.  For women ages 22-65, health care providers may recommend pelvic exams and Pap testing every 3 years, or they may recommend the Pap and pelvic exam, combined with testing for human papilloma virus (HPV), every 5 years. Some types of HPV increase your risk of cervical cancer. Testing for HPV may also be done on women of any age with unclear Pap test results.  Other health care providers may not recommend any screening for nonpregnant women who are considered low risk for pelvic cancer and who do not have symptoms. Ask your health care provider if a screening pelvic exam is right for you.  If you have had past treatment for cervical cancer or a condition that could lead to cancer, you need Pap tests and screening for cancer for at least 20 years after your treatment. If Pap tests have been discontinued, your risk factors (such as having a new sexual  partner) need to be reassessed to determine if screening should resume. Some women have medical problems that increase the chance of getting cervical cancer. In these cases, your health care provider may recommend more frequent screening and Pap tests.  Colorectal Cancer  This type of cancer can be detected and often prevented.  Routine colorectal cancer screening usually begins at 62 years of age and continues through 62 years of age.  Your health care provider may recommend screening at an earlier age if you have risk factors for colon cancer.  Your health care provider may also recommend using home test kits to check for hidden blood in the stool.  A small camera at the end of a tube can be used to examine your colon directly (sigmoidoscopy or colonoscopy). This is done to check for  the earliest forms of colorectal cancer.  Routine screening usually begins at age 20.  Direct examination of the colon should be repeated every 5-10 years through 62 years of age. However, you may need to be screened more often if early forms of precancerous polyps or small growths are found.  Skin Cancer  Check your skin from head to toe regularly.  Tell your health care provider about any new moles or changes in moles, especially if there is a change in a mole's shape or color.  Also tell your health care provider if you have a mole that is larger than the size of a pencil eraser.  Always use sunscreen. Apply sunscreen liberally and repeatedly throughout the day.  Protect yourself by wearing long sleeves, pants, a wide-brimmed hat, and sunglasses whenever you are outside.  Heart disease, diabetes, and high blood pressure  High blood pressure causes heart disease and increases the risk of stroke. High blood pressure is more likely to develop in: ? People who have blood pressure in the high end of the normal range (130-139/85-89 mm Hg). ? People who are overweight or obese. ? People who are African  American.  If you are 33-67 years of age, have your blood pressure checked every 3-5 years. If you are 7 years of age or older, have your blood pressure checked every year. You should have your blood pressure measured twice-once when you are at a hospital or clinic, and once when you are not at a hospital or clinic. Record the average of the two measurements. To check your blood pressure when you are not at a hospital or clinic, you can use: ? An automated blood pressure machine at a pharmacy. ? A home blood pressure monitor.  If you are between 28 years and 39 years old, ask your health care provider if you should take aspirin to prevent strokes.  Have regular diabetes screenings. This involves taking a blood sample to check your fasting blood sugar level. ? If you are at a normal weight and have a low risk for diabetes, have this test once every three years after 62 years of age. ? If you are overweight and have a high risk for diabetes, consider being tested at a younger age or more often. Preventing infection Hepatitis B  If you have a higher risk for hepatitis B, you should be screened for this virus. You are considered at high risk for hepatitis B if: ? You were born in a country where hepatitis B is common. Ask your health care provider which countries are considered high risk. ? Your parents were born in a high-risk country, and you have not been immunized against hepatitis B (hepatitis B vaccine). ? You have HIV or AIDS. ? You use needles to inject street drugs. ? You live with someone who has hepatitis B. ? You have had sex with someone who has hepatitis B. ? You get hemodialysis treatment. ? You take certain medicines for conditions, including cancer, organ transplantation, and autoimmune conditions.  Hepatitis C  Blood testing is recommended for: ? Everyone born from [birth year] through 1965. ? Anyone with known risk factors for hepatitis C.  Sexually transmitted infections  (STIs)  You should be screened for sexually transmitted infections (STIs) including gonorrhea and chlamydia if: ? You are sexually active and are younger than 62 years of age. ? You are older than 62 years of age and your health care provider tells you that you are at risk for this  type of infection. ? Your sexual activity has changed since you were last screened and you are at an increased risk for chlamydia or gonorrhea. Ask your health care provider if you are at risk.  If you do not have HIV, but are at risk, it may be recommended that you take a prescription medicine daily to prevent HIV infection. This is called pre-exposure prophylaxis (PrEP). You are considered at risk if: ? You are sexually active and do not regularly use condoms or know the HIV status of your partner(s). ? You take drugs by injection. ? You are sexually active with a partner who has HIV.  Talk with your health care provider about whether you are at high risk of being infected with HIV. If you choose to begin PrEP, you should first be tested for HIV. You should then be tested every 3 months for as long as you are taking PrEP. Pregnancy  If you are premenopausal and you may become pregnant, ask your health care provider about preconception counseling.  If you may become pregnant, take 400 to 800 micrograms (mcg) of folic acid every day.  If you want to prevent pregnancy, talk to your health care provider about birth control (contraception). Osteoporosis and menopause  Osteoporosis is a disease in which the bones lose minerals and strength with aging. This can result in serious bone fractures. Your risk for osteoporosis can be identified using a bone density scan.  If you are 53 years of age or older, or if you are at risk for osteoporosis and fractures, ask your health care provider if you should be screened.  Ask your health care provider whether you should take a calcium or vitamin D supplement to lower your risk  for osteoporosis.  Menopause may have certain physical symptoms and risks.  Hormone replacement therapy may reduce some of these symptoms and risks. Talk to your health care provider about whether hormone replacement therapy is right for you. Follow these instructions at home:  Schedule regular health, dental, and eye exams.  Stay current with your immunizations.  Do not use any tobacco products including cigarettes, chewing tobacco, or electronic cigarettes.  If you are pregnant, do not drink alcohol.  If you are breastfeeding, limit how much and how often you drink alcohol.  Limit alcohol intake to no more than 1 drink per day for nonpregnant women. One drink equals 12 ounces of beer, 5 ounces of wine, or 1 ounces of hard liquor.  Do not use street drugs.  Do not share needles.  Ask your health care provider for help if you need support or information about quitting drugs.  Tell your health care provider if you often feel depressed.  Tell your health care provider if you have ever been abused or do not feel safe at home. This information is not intended to replace advice given to you by your health care provider. Make sure you discuss any questions you have with your health care provider. Document Released: 08/21/2010 Document Revised: 07/14/2015 Document Reviewed: 11/09/2014 Elsevier Interactive Patient Education  [employer].

## 2017-08-17 NOTE — Progress Notes (Signed)
[facility] [address], [place] [address] Phone: [phone] Subjective:     I'm seeing this patient by the request  of:  Panosh, Standley Brooking, MD   CC: right ankle pain   [id number]  Samantha Hinton is a 62 y.o. female coming in with complaint of right ankle pain. Swollen. 1 year or 2 ago she sprained her ankle. States her ankle gives out on her. Came down on her ankle wrong about a month ago. Near the talar dome she has a cyst like deformity. Decreased ROM. Left foot second toe has numbness due to back surgery.   Onset- 1 month ago Location- Lateral ankle, talar dome Duration- Pain all day Character- Sharp Aggravating factors- Stairs Reliving factors-  Therapies tried- Ibuprofen Severity-6 out of 10 but not improving.  Only thing that is improved is been the swelling     Past Medical History:  Diagnosis Date  . Chronic back pain    buldging disc and tumor.Spondylolisthesis  . COPD (chronic obstructive pulmonary disease) (HCC)    Symbicort daily and ProAir as needed  . Emphysema   . Emphysema of lung ([facility])   . Fibromyalgia   . Genital warts   . GERD (gastroesophageal reflux disease)    takes Omeprazole daily  . History of bronchitis 01/2014  . History of colon polyps   . Hyperlipidemia    has been off of meds d/t getting sick from them.Not been addressed again.   Marland Kitchen Hypertension    takes Ziac daily  . Nocturia   . Pneumonia    hx of-8+yrs ago  . PONV (postoperative nausea and vomiting)   . Shortness of breath dyspnea    thinks d/t pain meds and notices with exertion  . 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 ABDOMINAL HYSTERECTOMY  1983   partial   . ADENOIDECTOMY    . ANTERIOR (CYSTOCELE) AND POSTERIOR REPAIR (RECTOCELE) WITH XENFORM GRAFT AND SACROSPINOUS FIXATION    . APPENDECTOMY  1967  . BREAST SURGERY Right    diseased milk glands  . CHOLECYSTECTOMY  1983    . COLONOSCOPY    . DIAGNOSTIC LAPAROSCOPY     cyst removed from ovaries   . ESOPHAGOGASTRODUODENOSCOPY    . NECK SURGERY  [phone]   c spine ant and post hx fusion and removal or harcdware and shavings  . rods and screws in lumbar    . TONSILLECTOMY     Social History   Socioeconomic History  . Marital status: Widowed    Spouse name: Not on file  . Number of children: 2  . Years of education: Not on file  . Highest education level: Not on file  Occupational History  . Occupation: retired    Fish farm manager: Scientist, research (physical sciences)  Social Needs  . Financial resource strain: Not on file  . Food insecurity:    Worry: Not on file    Inability: Not on file  . Transportation needs:    Medical: Not on file    Non-medical: Not on file  Tobacco Use  . Smoking status: Former Smoker    Packs/day: 0.25    Years: 44.00    Pack years: 11.00    Types: Cigarettes    Last attempt to quit: 02/04/2014    Years since quitting: 3.5  . Smokeless tobacco: Never Used  . Tobacco comment: quit smoking in Dec 2015  Substance and Sexual Activity  . Alcohol use: Yes    Alcohol/week: 2.4 oz    Types: 4 Standard drinks or equivalent per week    Comment: couple times a week  . Drug use: No  . Sexual activity: Not on file  Lifestyle  . Physical activity:    Days per week: Not on file    Minutes per session: Not on file  . Stress: Not on file  Relationships  . Social connections:    Talks on phone: Not on file    Gets together: Not on file    Attends religious service: Not on file    Active member of club or organization: Not on file    Attends meetings of clubs or organizations: Not on file    Relationship status: Not on file  Other Topics Concern  . Not on file  Social History Narrative   6-8 hours of sleep per night   Lives with her fiance hh of 2 no pets    1 dog in the home   Retired no ets  But tob 8 per day   etoh ocass 1-2    On disability from her neck surgery predicaments .   orig  from [employer] in [place] area.    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 2   Last Pap 2006 last mammogram 2000 and   Has dentures   FA    Allergies  Allergen Reactions  . Wellbutrin [Bupropion] Nausea And Vomiting   Family History  Problem Relation Age of Onset  . CAD Father        tumor between heart and lung   . Hypertension Father   . CAD Sister        ? dx   . Hypertension Mother   . Lung cancer Mother        died 22   . Diabetes Maternal Grandmother        late 85s      Past medical history, social, surgical and family history all reviewed in electronic medical record.  No pertanent information unless stated regarding to the chief complaint.   Review of Systems:Review of systems updated and as accurate as of 08/19/17  No headache, visual changes, nausea, vomiting, diarrhea, constipation, dizziness, abdominal pain, skin rash, fevers, chills, night sweats, weight loss, swollen lymph nodes, body aches, joint swelling, muscle aches, chest pain, shortness of breath, mood changes.   Objective  Blood pressure (!) 150/90, pulse 91, height 5' 4.75" (1.645 m), weight 215 lb (97.5 kg), SpO2 96 %. Systems examined below as of 08/19/17   General: No apparent distress alert and oriented x3 mood and affect normal, dressed appropriately.  HEENT: Pupils equal, extraocular movements intact  Respiratory: Patient's speak in full sentences and does not appear short of breath  Cardiovascular: No lower extremity edema, non tender, no erythema  Skin: Warm dry intact with no signs of infection or rash on extremities or on axial skeleton.  Abdomen: Soft nontender  Neuro: Cranial nerves II through XII are intact, neurovascularly intact in all extremities w Lymph: No lymphadenopathy of posterior or anterior cervical chain or axillae bilaterally.  Gait antalgic gait MSK:  Non tender with full range of motion and good stability and symmetric strength and tone of shoulders,  elbows, wrist, hip, knee bilaterally.  Right ankle exam shows the patient does have mild arthritic changes of the ankle compared to the contralateral side.  Patient does  have atrophy noted of the lateral compartment of the ankle.  Patient does have some mild decrease in range of motion in plantarflexion and extension.  Patient does have decreasing pulses noted of the dorsalis pedis as well as the posterior tibialis of 1+ but it seems to be symmetric to the contralateral side.  Severe tenderness to palpation with even light palpation surrounding the anterior aspect of the ankle.  MSK US performed of: Right ankle This study was ordered, performed, and interpreted by Charlann Boxer D.O.  Foot/Ankle:    Talar dome narrowing noted Ankle mortise with trace effusion Peroneus longus and brevis tendons unremarkable atrophy noted Posterior tibialis, flexor hallucis longus, and flexor digitorum longus tendons unremarkable on long and transverse views without sheath effusions. Patient also has what appears to be a very small fascial herniation of the anterior tibialis muscle but no increasing hypoechoic changes or any abnormal vascularity.  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      Note: This dictation was prepared with Dragon dictation along with smaller phrase technology. Any transcriptional errors that result from this process are unintentional.

## 2017-08-19 ENCOUNTER — Ambulatory Visit: Payer: Self-pay

## 2017-08-19 ENCOUNTER — Ambulatory Visit (INDEPENDENT_AMBULATORY_CARE_PROVIDER_SITE_OTHER): Payer: Medicare HMO | Admitting: Family Medicine

## 2017-08-19 ENCOUNTER — Encounter: Payer: Self-pay | Admitting: Family Medicine

## 2017-08-19 ENCOUNTER — Ambulatory Visit (INDEPENDENT_AMBULATORY_CARE_PROVIDER_SITE_OTHER)
Admission: RE | Admit: 2017-08-19 | Discharge: 2017-08-19 | Disposition: A | Discharge status: Home / Self Care | Payer: Medicare HMO | Source: Ambulatory Visit | Attending: Family Medicine | Admitting: Family Medicine

## 2017-08-19 VITALS — BP 150/90 | HR 91 | Ht 64.75 in | Wt 215.0 lb

## 2017-08-19 DIAGNOSIS — M25571 Pain in right ankle and joints of right foot: Secondary | ICD-10-CM | POA: Diagnosis not present

## 2017-08-19 DIAGNOSIS — G8929 Other chronic pain: Secondary | ICD-10-CM

## 2017-08-19 DIAGNOSIS — S99911A Unspecified injury of right ankle, initial encounter: Secondary | ICD-10-CM | POA: Diagnosis not present

## 2017-08-19 MED ORDER — GABAPENTIN 100 MG PO CAPS: 200.0000 mg | ORAL_CAPSULE | Freq: Every day | ORAL | 3 refills | Status: DC

## 2017-08-19 NOTE — Patient Instructions (Addendum)
Good to meet you  We will get xray downstairs Air cast wear with walking  Ice 20 minutes 2 times daily. Usually after activity and before bed. We will get test to rule out clot and check blood flow of the ankle.  I want to also have you start gabapentin 200mg  at night Stay active.  See me again in 3-4 weeks to make sure you are doing better.  If worsening then clal me and we will take care of it.

## 2017-08-19 NOTE — Assessment & Plan Note (Signed)
Right ankle pain.  Patient has had this for quite some time.  This is an abnormal look.  We will get x-rays, will check blood flow, start on gabapentin because there is a possibility that patient's back surgery could have caused some nerve impingement.  Some mild weakness of the leg noted.

## 2017-08-20 ENCOUNTER — Other Ambulatory Visit: Payer: Self-pay | Admitting: Family Medicine

## 2017-08-20 DIAGNOSIS — M25571 Pain in right ankle and joints of right foot: Principal | ICD-10-CM

## 2017-08-20 DIAGNOSIS — G8929 Other chronic pain: Secondary | ICD-10-CM

## 2017-08-21 ENCOUNTER — Ambulatory Visit (HOSPITAL_COMMUNITY)
Admission: RE | Admit: 2017-08-21 | Discharge: 2017-08-21 | Disposition: A | Discharge status: Home / Self Care | Payer: Medicare HMO | Source: Ambulatory Visit | Attending: Cardiology | Admitting: Cardiology

## 2017-08-21 DIAGNOSIS — M25571 Pain in right ankle and joints of right foot: Secondary | ICD-10-CM | POA: Diagnosis not present

## 2017-08-21 DIAGNOSIS — G8929 Other chronic pain: Secondary | ICD-10-CM | POA: Diagnosis not present

## 2017-08-23 ENCOUNTER — Ambulatory Visit (HOSPITAL_COMMUNITY)
Admission: RE | Admit: 2017-08-23 | Discharge: 2017-08-23 | Disposition: A | Discharge status: Home / Self Care | Payer: Medicare HMO | Source: Ambulatory Visit | Attending: Cardiovascular Disease | Admitting: Cardiovascular Disease

## 2017-08-23 DIAGNOSIS — G8929 Other chronic pain: Secondary | ICD-10-CM | POA: Diagnosis not present

## 2017-08-23 DIAGNOSIS — M25571 Pain in right ankle and joints of right foot: Secondary | ICD-10-CM

## 2017-09-11 NOTE — Progress Notes (Signed)
[name] [place], [place] [address] Phone: [phone] Subjective:      CC: Right ankle pain  WCH:[id number]  Samantha Hinton is a 62 y.o. female coming in with complaint of right ankle pain. States she has good and bad days. Swelling has gone down.  Patient was found to have moderate arthritic changes on x-rays that were independently visualized by me.  Patient has had continued intermittent swelling, as well as discoloration of the leg.  Had Doppler done that was unremarkable for any type of clot or vascular compromise      Past Medical History:  Diagnosis Date  . Chronic back pain    buldging disc and tumor.Spondylolisthesis  . COPD (chronic obstructive pulmonary disease) (HCC)    Symbicort daily and ProAir as needed  . Emphysema   . Emphysema of lung ([place])   . Fibromyalgia   . Genital warts   . GERD (gastroesophageal reflux disease)    takes Omeprazole daily  . History of bronchitis 01/2014  . History of colon polyps   . Hyperlipidemia    has been off of meds d/t getting sick from them.Not been addressed again.   Marland Kitchen Hypertension    takes Ziac daily  . Nocturia   . Pneumonia    hx of-8+yrs ago  . PONV (postoperative nausea and vomiting)   . Shortness of breath dyspnea    thinks d/t pain meds and notices with exertion  . Stroke ([place])    16 yrs ago--left eye    Past Surgical History:  Procedure Laterality Date  . ABDOMINAL HYSTERECTOMY  1983   partial   . ADENOIDECTOMY    . ANTERIOR (CYSTOCELE) AND POSTERIOR REPAIR (RECTOCELE) WITH XENFORM GRAFT AND SACROSPINOUS FIXATION    . APPENDECTOMY  1967  . BREAST SURGERY Right    diseased milk glands  . CHOLECYSTECTOMY  1983  . COLONOSCOPY    . DIAGNOSTIC LAPAROSCOPY     cyst removed from ovaries   . ESOPHAGOGASTRODUODENOSCOPY    . NECK SURGERY  [phone]   c spine ant and post hx fusion and removal or harcdware and shavings  . rods and screws in lumbar    .  TONSILLECTOMY     Social History   Socioeconomic History  . Marital status: Widowed    Spouse name: Not on file  . Number of children: 2  . Years of education: Not on file  . Highest education level: Not on file  Occupational History  . Occupation: retired    Fish farm manager: Scientist, research (physical sciences)  Social Needs  . Financial resource strain: Not on file  . Food insecurity:    Worry: Not on file    Inability: Not on file  . Transportation needs:    Medical: Not on file    Non-medical: Not on file  Tobacco Use  . Smoking status: Former Smoker    Packs/day: 0.25    Years: 44.00    Pack years: 11.00    Types: Cigarettes    Last attempt to quit: 02/04/2014    Years since quitting: 3.6  . Smokeless tobacco: Never Used  . Tobacco comment: quit smoking in Dec 2015  Substance and Sexual Activity  . Alcohol use: Yes    Alcohol/week: 2.4 oz    Types: 4 Standard drinks or equivalent per week    Comment: couple times a week  . Drug use: No  . Sexual activity: Not on file  Lifestyle  . Physical  activity:    Days per week: Not on file    Minutes per session: Not on file  . Stress: Not on file  Relationships  . Social connections:    Talks on phone: Not on file    Gets together: Not on file    Attends religious service: Not on file    Active member of club or organization: Not on file    Attends meetings of clubs or organizations: Not on file    Relationship status: Not on file  Other Topics Concern  . Not on file  Social History Narrative   6-8 hours of sleep per night   Lives with her fiance hh of 2 no pets    1 dog in the home   Retired no ets  But tob 8 per day   etoh ocass 1-2    On disability from her neck surgery predicaments .   orig from [employer] in [place] area.    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 2   Last Pap 2006 last mammogram 2000 and   Has dentures   FA    Allergies  Allergen Reactions  . Wellbutrin [Bupropion] Nausea And Vomiting    Family History  Problem Relation Age of Onset  . CAD Father        tumor between heart and lung   . Hypertension Father   . CAD Sister        ? dx   . Hypertension Mother   . Lung cancer Mother        died 59   . Diabetes Maternal Grandmother        late 18s      Past medical history, social, surgical and family history all reviewed in electronic medical record.  No pertanent information unless stated regarding to the chief complaint.   Review of Systems: No  visual changes, nausea, vomiting, diarrhea, constipation, dizziness, abdominal pain, skin rash, fevers, chills, night sweats, weight loss, swollen lymph nodes, body aches, joint swelling, , chest pain, shortness of breath, mood changes.  Positive muscle aches, headache  Objective  Blood pressure 132/76, pulse 92, height 5' 4.75" (1.645 m), weight 206 lb (93.4 kg), SpO2 96 %.   General: No apparent distress alert and oriented x3 mood and affect normal, dressed appropriately.  HEENT: Pupils equal, extraocular movements intact  Respiratory: Patient's speak in full sentences and does not appear short of breath  Cardiovascular: No lower extremity edema, non tender, no erythema  Skin: Warm dry intact with no signs of infection or rash on extremities or on axial skeleton.  Abdomen: Soft nontender  Neuro: Cranial nerves II through XII are intact, neurovascularly intact in all extremities with 2+ DTRs and 2+ pulses.  Lymph: No lymphadenopathy of posterior or anterior cervical chain or axillae bilaterally.  Gait antalgic gait MSK:  Non tender with full range of motion and good stability and symmetric strength and tone of shoulders, elbows, wrist, hip, knee and ankles bilaterally.  Mild to moderate arthritic changes of multiple joints Right ankle shows that does have discoloration compared to the contralateral side.  Cold to touch.  Ankle has diffuse tenderness to palpation.  Negative pain with calf compression.  Mild positive straight  leg test.  Deep tendon reflexes appear to be intact and symmetric    Impression and Recommendations:     This case required medical decision making of moderate complexity.      Note: This  dictation was prepared with Dragon dictation along with smaller phrase technology. Any transcriptional errors that result from this process are unintentional.

## 2017-09-13 ENCOUNTER — Ambulatory Visit: Payer: Medicare HMO | Admitting: Family Medicine

## 2017-09-13 ENCOUNTER — Encounter: Payer: Self-pay | Admitting: Family Medicine

## 2017-09-13 VITALS — BP 132/76 | HR 92 | Ht 64.75 in | Wt 206.0 lb

## 2017-09-13 DIAGNOSIS — G90521 Complex regional pain syndrome I of right lower limb: Secondary | ICD-10-CM | POA: Diagnosis not present

## 2017-09-13 DIAGNOSIS — M4316 Spondylolisthesis, lumbar region: Secondary | ICD-10-CM

## 2017-09-13 MED ORDER — VENLAFAXINE HCL ER 37.5 MG PO CP24: 37.5000 mg | ORAL_CAPSULE | Freq: Every day | ORAL | 1 refills | Status: DC

## 2017-09-13 NOTE — Patient Instructions (Signed)
I thin k you have reflex sympathetic dystrophy of the leg.  effexor 37.5 mg daily can help stabilize the nerve.  Watch for side effects  Keep losing the weight  Wear the brace as needed Work on range of motion of the ankle.  See me again in 4-6 weeks

## 2017-09-14 ENCOUNTER — Encounter: Payer: Self-pay | Admitting: Family Medicine

## 2017-09-14 DIAGNOSIS — G90521 Complex regional pain syndrome I of right lower limb: Secondary | ICD-10-CM | POA: Insufficient documentation

## 2017-09-14 NOTE — Assessment & Plan Note (Signed)
Concern for more of a reflex sympathetic dystrophy at this time.  Differential includes a lumbar radiculopathy but with patient's also vascular abnormality I think this is more likely.  Discussed with patient about icing regimen, gabapentin, started low-dose Effexor.  Hoping that this will be beneficial.  Discussed compression.  Follow-up again in 3 to 4 weeks

## 2017-09-19 ENCOUNTER — Other Ambulatory Visit: Payer: Self-pay | Admitting: Internal Medicine

## 2017-10-11 NOTE — Progress Notes (Signed)
Chief Complaint  Patient presents with  . Follow-up    Pt reports that her BP has been doing really good, back to normal. Pt tolerating Ziac well, no complaints. Pt has changed her diet and has lost 27lbs! Reviews labs today.     HPI: Samantha Hinton 62 y.o. come in for Chronic disease management   Seem for pv in June  And to fu about bp and with monitor   See  Last  Plan  In OV note bp readings   140/86   Or better .    And now better  Back on prev med  feeloing well  Has lost 27 # with lsi dietary plan   .  Back on  ziac  Feels better  The diuretic no causing sx  At this time Pain is better . Back and foot but ongoing  Low dose   effexor  And gaba at night.  Out look is more positive so far  resp no new sx   ROS: See pertinent positives and negatives per HPI. No cp sob   Sig others   Mom from [place]     Stress   Past Medical History:  Diagnosis Date  . Chronic back pain    buldging disc and tumor.Spondylolisthesis  . COPD (chronic obstructive pulmonary disease) (HCC)    Symbicort daily and ProAir as needed  . Emphysema   . Emphysema of lung ([place])   . Fibromyalgia   . Genital warts   . GERD (gastroesophageal reflux disease)    takes Omeprazole daily  . History of bronchitis 01/2014  . History of colon polyps   . Hyperlipidemia    has been off of meds d/t getting sick from them.Not been addressed again.   Marland Kitchen Hypertension    takes Ziac daily  . Nocturia   . Pneumonia    hx of-8+yrs ago  . PONV (postoperative nausea and vomiting)   . Shortness of breath dyspnea    thinks d/t pain meds and notices with exertion  . Stroke [facility])    16 yrs ago--left eye     Family History  Problem Relation Age of Onset  . CAD Father        tumor between heart and lung   . Hypertension Father   . CAD Sister        ? dx   . Hypertension Mother   . Lung cancer Mother        died 68   . Diabetes Maternal Grandmother        late 58s     Social History    Socioeconomic History  . Marital status: Widowed    Spouse name: Not on file  . Number of children: 2  . Years of education: Not on file  . Highest education level: Not on file  Occupational History  . Occupation: retired    Fish farm manager: Scientist, research (physical sciences)  Social Needs  . Financial resource strain: Not on file  . Food insecurity:    Worry: Not on file    Inability: Not on file  . Transportation needs:    Medical: Not on file    Non-medical: Not on file  Tobacco Use  . Smoking status: Former Smoker    Packs/day: 0.25    Years: 44.00    Pack years: 11.00    Types: Cigarettes    Last attempt to quit: 02/04/2014    Years since quitting: 3.6  . Smokeless tobacco: Never  Used  . Tobacco comment: quit smoking in Dec 2015  Substance and Sexual Activity  . Alcohol use: Yes    Alcohol/week: 4.0 standard drinks    Types: 4 Standard drinks or equivalent per week    Comment: couple times a week  . Drug use: No  . Sexual activity: Not on file  Lifestyle  . Physical activity:    Days per week: Not on file    Minutes per session: Not on file  . Stress: Not on file  Relationships  . Social connections:    Talks on phone: Not on file    Gets together: Not on file    Attends religious service: Not on file    Active member of club or organization: Not on file    Attends meetings of clubs or organizations: Not on file    Relationship status: Not on file  Other Topics Concern  . Not on file  Social History Narrative   6-8 hours of sleep per night   Lives with her fiance hh of 2 no pets    1 dog in the home   Retired no ets  But tob 8 per day   etoh ocass 1-2    On disability from her neck surgery predicaments .   orig from [employer] in [place] area.    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 2   Last Pap 2006 last mammogram 2000 and   Has dentures   FA     Outpatient Medications Prior to Visit  Medication Sig Dispense Refill  . albuterol (PROVENTIL  HFA;VENTOLIN HFA) 108 (90 Base) MCG/ACT inhaler Inhale 1-2 puffs into the lungs every 6 (six) hours as needed for wheezing or shortness of breath. Avoid regular use 1 Inhaler 1  . bisoprolol-hydrochlorothiazide (ZIAC) 5-6.25 MG tablet TAKE 1 TABLET BY MOUTH ONCE DAILY 90 tablet 0  . budesonide-formoterol (SYMBICORT) 160-4.5 MCG/ACT inhaler Inhale 2 puffs into the lungs 2 (two) times daily. As needed    . cetirizine (ZYRTEC) 10 MG tablet Take 10 mg by mouth daily.    Marland Kitchen gabapentin (NEURONTIN) 100 MG capsule Take 2 capsules (200 mg total) by mouth at bedtime. 60 capsule 3  . Multiple Vitamins-Minerals (CENTRUM PO) Take 1 tablet by mouth daily.     Marland Kitchen omeprazole (PRILOSEC) 20 MG capsule TAKE 1 CAPSULE BY MOUTH ONCE DAILY 90 capsule 2  . venlafaxine XR (EFFEXOR XR) 37.5 MG 24 hr capsule Take 1 capsule (37.5 mg total) by mouth daily with breakfast. 30 capsule 1   No facility-administered medications prior to visit.      EXAM:  Pulse 84   Temp 98.1 F (36.7 C) (Oral)   Wt 191 lb 12.8 oz (87 kg)   BMI 32.16 kg/m   Body mass index is 32.16 kg/m.  GENERAL: vitals reviewed and listed above, alert, oriented, appears well hydrated and in no acute distress HEENT: atraumatic, conjunctiva  clear, no obvious abnormalities on inspection of external nose and ears NECK: no obvious masses on inspection palpation  LUNGS: clear to auscultation bilaterally, no wheezes, rales or rhonchi,   CV: HRRR, no clubbing cyanosis or  peripheral edema nl cap refill  MS: moves all extremities without noticeable focal  abnormality PSYCH: pleasant and cooperative, no obvious depression or anxiety Lab Results  Component Value Date   WBC 7.3 08/12/2017   HGB 13.8 08/12/2017   HCT 41.7 08/12/2017   PLT 248.0 08/12/2017   GLUCOSE 96  08/12/2017   CHOL 246 (H) 08/12/2017   TRIG 221.0 (H) 08/12/2017   HDL 57.20 08/12/2017   LDLDIRECT 152.0 08/12/2017   LDLCALC 116 (H) 05/24/2014   ALT 40 (H) 08/12/2017   AST 23  08/12/2017   NA 140 08/12/2017   K 4.3 08/12/2017   CL 100 08/12/2017   CREATININE 0.74 08/12/2017   BUN 17 08/12/2017   CO2 29 08/12/2017   TSH 0.95 08/12/2017   BP Readings from Last 3 Encounters:  09/13/17 132/76  08/19/17 (!) 150/90  08/12/17 (!) 154/88   Wt Readings from Last 3 Encounters:  10/14/17 191 lb 12.8 oz (87 kg)  09/13/17 206 lb (93.4 kg)  08/19/17 215 lb (97.5 kg)     ASSESSMENT AND PLAN:  Discussed the following assessment and plan:  Essential hypertension, benign - Plan: Hepatic function panel, Basic metabolic panel  Medication management - Plan: Hepatic function panel, Basic metabolic panel  Abnormal LFTs - Plan: Hepatic function panel, Basic metabolic panel  COPD GOLD II  Need for influenza vaccination - Plan: Flu Vaccine QUAD 6+ mos PF IM (Fluarix Quad PF) healthy weight loss has been helpful  For her medical conditions ,encouraged to continue .      Lab today  And if ok then  cpx and fasting labs in JUne 2020   Back and foot    Under care  Management Flu vaccine today .    She hs been on this bp med for years and seems to do best with this   So wills tay on this as  Had side effects when changed .  Reassess  Yearly  Total visit 12mins > 50% spent counseling and coordinating care as indicated in above note and in instructions to patient .   -Patient advised to return or notify health care team  if  new concerns arise.  Patient Instructions   Continue attention to lifestyle intervention healthy eating and exercise . Good job ! Make a preventive plan for the winter .Marland Kitchen...   Recheck h the liver panel   Etc today and if all ok then yearly . If your blood presure is too  Low let us know  To adjust medication .   Wt Readings from Last 3 Encounters:  10/14/17 191 lb 12.8 oz (87 kg)  09/13/17 206 lb (93.4 kg)  08/19/17 215 lb (97.5 kg)     The 10-year ASCVD risk score Mikey Bussing DC Jr., et al., 2013) is: 5.9%   Values used to calculate the score:      Age: 56 years     Sex: Female     Is Non-Hispanic African American: No     Diabetic: No     Tobacco smoker: No     Systolic Blood Pressure: 643 mmHg     Is BP treated: Yes     HDL Cholesterol: 57.2 mg/dL     Total Cholesterol: 246 mg/dL     Standley Brooking. Korrie Hofbauer M.D.

## 2017-10-14 ENCOUNTER — Encounter: Payer: Self-pay | Admitting: Internal Medicine

## 2017-10-14 ENCOUNTER — Ambulatory Visit (INDEPENDENT_AMBULATORY_CARE_PROVIDER_SITE_OTHER): Payer: Medicare HMO | Admitting: Internal Medicine

## 2017-10-14 VITALS — BP 122/70 | HR 84 | Temp 98.1°F | Wt 191.8 lb

## 2017-10-14 DIAGNOSIS — I10 Essential (primary) hypertension: Secondary | ICD-10-CM

## 2017-10-14 DIAGNOSIS — J449 Chronic obstructive pulmonary disease, unspecified: Secondary | ICD-10-CM

## 2017-10-14 DIAGNOSIS — Z79899 Other long term (current) drug therapy: Secondary | ICD-10-CM | POA: Diagnosis not present

## 2017-10-14 DIAGNOSIS — R945 Abnormal results of liver function studies: Secondary | ICD-10-CM

## 2017-10-14 DIAGNOSIS — Z23 Encounter for immunization: Secondary | ICD-10-CM | POA: Diagnosis not present

## 2017-10-14 DIAGNOSIS — R7989 Other specified abnormal findings of blood chemistry: Secondary | ICD-10-CM

## 2017-10-14 LAB — BASIC METABOLIC PANEL
BUN: 11 mg/dL (ref 6–23)
CO2: 33 meq/L — AB (ref 19–32)
Calcium: 10.4 mg/dL (ref 8.4–10.5)
Chloride: 95 mEq/L — ABNORMAL LOW (ref 96–112)
Creatinine, Ser: 0.87 mg/dL (ref 0.40–1.20)
GFR: 70.14 mL/min (ref >60.00)
GLUCOSE: 90 mg/dL (ref 70–99)
POTASSIUM: 4.6 meq/L (ref 3.5–5.1)
SODIUM: 135 meq/L (ref 135–145)

## 2017-10-14 LAB — HEPATIC FUNCTION PANEL
ALT: 33 U/L (ref 0–35)
AST: 27 U/L (ref 0–37)
Albumin: 4.6 g/dL (ref 3.5–5.2)
Alkaline Phosphatase: 79 U/L (ref 39–117)
BILIRUBIN TOTAL: 0.8 mg/dL (ref 0.2–1.2)
Bilirubin, Direct: 0.1 mg/dL (ref 0.0–0.3)
Total Protein: 7.5 g/dL (ref 6.0–8.3)

## 2017-10-14 NOTE — Patient Instructions (Addendum)
Continue attention to lifestyle intervention healthy eating and exercise . Good job ! Make a preventive plan for the winter .Marland Kitchen...   Recheck h the liver panel   Etc today and if all ok then yearly . If your blood presure is too  Low let us know  To adjust medication .   Wt Readings from Last 3 Encounters:  10/14/17 191 lb 12.8 oz (87 kg)  09/13/17 206 lb (93.4 kg)  08/19/17 215 lb (97.5 kg)     The 10-year ASCVD risk score Mikey Bussing DC Jr., et al., 2013) is: 5.9%   Values used to calculate the score:     Age: 62 years     Sex: Female     Is Non-Hispanic African American: No     Diabetic: No     Tobacco smoker: No     Systolic Blood Pressure: 550 mmHg     Is BP treated: Yes     HDL Cholesterol: 57.2 mg/dL     Total Cholesterol: 246 mg/dL

## 2017-10-24 ENCOUNTER — Encounter: Payer: Medicare HMO | Admitting: Gastroenterology

## 2017-10-25 NOTE — Progress Notes (Signed)
[facility] [place], [place] [address] Phone: [phone] Subjective:       [name] am serving as a Education administrator for Dr. Hulan Saas.   CC: Right ankle pain follow-up  [id number]  Samantha Hinton is a 62 y.o. female coming in with complaint of back/leg pain. States that her pain is bearable. Hasn't have to wear the brace as much. Medications are helping. Would state that she is 35 to 45% better.  Feels that the Effexor has been very beneficial.  Being treated for more of a reflex sympathetic dystrophy.     Past Medical History:  Diagnosis Date  . Chronic back pain    buldging disc and tumor.Spondylolisthesis  . COPD (chronic obstructive pulmonary disease) (HCC)    Symbicort daily and ProAir as needed  . Emphysema   . Emphysema of lung ([place])   . Fibromyalgia   . Genital warts   . GERD (gastroesophageal reflux disease)    takes Omeprazole daily  . History of bronchitis 01/2014  . History of colon polyps   . Hyperlipidemia    has been off of meds d/t getting sick from them.Not been addressed again.   Marland Kitchen Hypertension    takes Ziac daily  . Nocturia   . Pneumonia    hx of-8+yrs ago  . PONV (postoperative nausea and vomiting)   . Shortness of breath dyspnea    thinks d/t pain meds and notices with exertion  . Stroke ([place])    16 yrs ago--left eye    Past Surgical History:  Procedure Laterality Date  . ABDOMINAL HYSTERECTOMY  1983   partial   . ADENOIDECTOMY    . ANTERIOR (CYSTOCELE) AND POSTERIOR REPAIR (RECTOCELE) WITH XENFORM GRAFT AND SACROSPINOUS FIXATION    . APPENDECTOMY  1967  . BREAST SURGERY Right    diseased milk glands  . CHOLECYSTECTOMY  1983  . COLONOSCOPY    . DIAGNOSTIC LAPAROSCOPY     cyst removed from ovaries   . ESOPHAGOGASTRODUODENOSCOPY    . NECK SURGERY  [phone]   c spine ant and post hx fusion and removal or harcdware and shavings  . rods and screws in lumbar    . TONSILLECTOMY       Social History   Socioeconomic History  . Marital status: Widowed    Spouse name: Not on file  . Number of children: 2  . Years of education: Not on file  . Highest education level: Not on file  Occupational History  . Occupation: retired    Fish farm manager: Scientist, research (physical sciences)  Social Needs  . Financial resource strain: Not on file  . Food insecurity:    Worry: Not on file    Inability: Not on file  . Transportation needs:    Medical: Not on file    Non-medical: Not on file  Tobacco Use  . Smoking status: Former Smoker    Packs/day: 0.25    Years: 44.00    Pack years: 11.00    Types: Cigarettes    Last attempt to quit: 02/04/2014    Years since quitting: 3.7  . Smokeless tobacco: Never Used  . Tobacco comment: quit smoking in Dec 2015  Substance and Sexual Activity  . Alcohol use: Yes    Alcohol/week: 4.0 standard drinks    Types: 4 Standard drinks or equivalent per week    Comment: couple times a week  . Drug use: No  . Sexual activity: Not on file  Lifestyle  . Physical activity:    Days per week: Not on file    Minutes per session: Not on file  . Stress: Not on file  Relationships  . Social connections:    Talks on phone: Not on file    Gets together: Not on file    Attends religious service: Not on file    Active member of club or organization: Not on file    Attends meetings of clubs or organizations: Not on file    Relationship status: Not on file  Other Topics Concern  . Not on file  Social History Narrative   6-8 hours of sleep per night   Lives with her fiance hh of 2 no pets    1 dog in the home   Retired no ets  But tob 8 per day   etoh ocass 1-2    On disability from her neck surgery predicaments .   orig from [employer] in [place] area.    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 2   Last Pap 2006 last mammogram 2000 and   Has dentures   FA    Allergies  Allergen Reactions  . Wellbutrin [Bupropion] Nausea And Vomiting   Family  History  Problem Relation Age of Onset  . CAD Father        tumor between heart and lung   . Hypertension Father   . CAD Sister        ? dx   . Hypertension Mother   . Lung cancer Mother        died 46   . Diabetes Maternal Grandmother        late 14s      Current Outpatient Medications (Cardiovascular):  .  bisoprolol-hydrochlorothiazide (ZIAC) 5-6.25 MG tablet, TAKE 1 TABLET BY MOUTH ONCE DAILY  Current Outpatient Medications (Respiratory):  .  albuterol (PROVENTIL HFA;VENTOLIN HFA) 108 (90 Base) MCG/ACT inhaler, Inhale 1-2 puffs into the lungs every 6 (six) hours as needed for wheezing or shortness of breath. Avoid regular use .  budesonide-formoterol (SYMBICORT) 160-4.5 MCG/ACT inhaler, Inhale 2 puffs into the lungs 2 (two) times daily. As needed .  cetirizine (ZYRTEC) 10 MG tablet, Take 10 mg by mouth daily.    Current Outpatient Medications (Other):  .  gabapentin (NEURONTIN) 100 MG capsule, Take 2 capsules (200 mg total) by mouth at bedtime. .  Multiple Vitamins-Minerals (CENTRUM PO), Take 1 tablet by mouth daily.  Marland Kitchen  omeprazole (PRILOSEC) 20 MG capsule, TAKE 1 CAPSULE BY MOUTH ONCE DAILY .  venlafaxine XR (EFFEXOR XR) 75 MG 24 hr capsule, Take 1 capsule (75 mg total) by mouth daily with breakfast.    Past medical history, social, surgical and family history all reviewed in electronic medical record.  No pertanent information unless stated regarding to the chief complaint.   Review of Systems:  No headache, visual changes, nausea, vomiting, diarrhea, constipation, dizziness, abdominal pain, skin rash, fevers, chills, night sweats, weight loss, swollen lymph nodes,chest pain, shortness of breath, mood changes.  Still positive muscle aches, joint swelling and body aches  Objective  Blood pressure (!) 148/74, pulse 86, height 5' 4.75" (1.645 m), weight 188 lb (85.3 kg), SpO2 93 %.    General: No apparent distress alert and oriented x3 mood and affect normal, dressed  appropriately.  HEENT: Pupils equal, extraocular movements intact  Respiratory: Patient's speak in full sentences and does not appear short of breath  Cardiovascular: No  lower extremity edema, non tender, no erythema  Skin: Warm dry intact with no signs of infection or rash on extremities or on axial skeleton.  Abdomen: Soft nontender  Neuro: Cranial nerves II through XII are intact, neurovascularly intact in all extremities with 2+ DTRs  Lymph: No lymphadenopathy of posterior or anterior cervical chain or axillae bilaterally.  Gait mild antalgic MSK:  tender with mild limited range of motion and good stability and symmetric strength and tone of shoulders, elbows, wrist, hip, knee bilaterally.  Right ankle exam still shows that patient does have a dusky appearance.  Patient does have some decrease in the dorsalis pedis on the right      Impression and Recommendations:     This case required medical decision making of moderate complexity. The above documentation has been reviewed and is accurate and complete Lyndal Pulley, DO       Note: This dictation was prepared with Dragon dictation along with smaller phrase technology. Any transcriptional errors that result from this process are unintentional.

## 2017-10-28 ENCOUNTER — Ambulatory Visit (INDEPENDENT_AMBULATORY_CARE_PROVIDER_SITE_OTHER): Payer: Medicare HMO | Admitting: Family Medicine

## 2017-10-28 ENCOUNTER — Encounter: Payer: Self-pay | Admitting: Family Medicine

## 2017-10-28 DIAGNOSIS — G90521 Complex regional pain syndrome I of right lower limb: Secondary | ICD-10-CM | POA: Diagnosis not present

## 2017-10-28 MED ORDER — VENLAFAXINE HCL ER 75 MG PO CP24: 75.0000 mg | ORAL_CAPSULE | Freq: Every day | ORAL | 1 refills | Status: DC

## 2017-10-28 NOTE — Patient Instructions (Signed)
Good to see you  Samantha Hinton is your friend.  Stay active.  Effexor now 75mg  daily and lets see how that does  See me again in 6 weeks

## 2017-10-28 NOTE — Assessment & Plan Note (Signed)
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  Is responding to the Effexor.  Increase dose to 75 mg.  Continue all other conservative therapy including home exercises, icing regimen, and discussed which activities of doing which wants to avoid.  Follow-up again in 4 to 8 weeks

## 2017-11-13 ENCOUNTER — Telehealth: Payer: Self-pay | Admitting: Internal Medicine

## 2017-11-13 NOTE — Telephone Encounter (Signed)
Copied from [place] [id number]. Topic: Quick Communication - Rx Refill/Question >> Nov 13, 2017 11:51 AM Reyne Dumas L wrote: Medication: venlafaxine XR (EFFEXOR XR) 75 MG 24 hr capsule  Has the patient contacted their pharmacy? No.  Pt states she needs to have the lower dose of this medication.  Pt states that this higher dose has made her sweat have headaches and muscle pains. (Agent: If no, request that the patient contact the pharmacy for the refill.) (Agent: If yes, when and what did the pharmacy advise?)  Preferred Pharmacy (with phone number or street name):[place], [place] - [address] [phone] (Phone) [phone] (Fax)  Agent: Please be advised that RX refills may take up to 3 business days. We ask that you follow-up with your pharmacy.

## 2017-11-13 NOTE — Telephone Encounter (Signed)
Please advise Dr Panosh, thanks.   

## 2017-11-14 MED ORDER — VENLAFAXINE HCL ER 37.5 MG PO CP24: 75.0000 mg | ORAL_CAPSULE | Freq: Every day | ORAL | 3 refills | Status: DC

## 2017-11-14 NOTE — Telephone Encounter (Signed)
Please send this message to dr Tamala Julian who ordered the medication

## 2017-11-14 NOTE — Telephone Encounter (Signed)
Sent in lower dose.

## 2017-11-14 NOTE — Telephone Encounter (Signed)
Will route to Dr.Smith 

## 2017-12-04 ENCOUNTER — Other Ambulatory Visit: Payer: Self-pay | Admitting: Family Medicine

## 2017-12-04 NOTE — Telephone Encounter (Signed)
Refill done.  

## 2017-12-07 NOTE — Progress Notes (Deleted)
[name] [address] [place], [place] [address] Phone: [phone] Subjective:    I'm seeing this patient by the request  of:    CC:   [id number]  Samantha Hinton is a 62 y.o. female coming in with complaint of ***  Onset-  Location Duration-  Character- Aggravating factors- Reliving factors-  Therapies tried-  Severity-     Past Medical History:  Diagnosis Date  . Chronic back pain    buldging disc and tumor.Spondylolisthesis  . COPD (chronic obstructive pulmonary disease) (HCC)    Symbicort daily and ProAir as needed  . Emphysema   . Emphysema of lung ([place])   . Fibromyalgia   . Genital warts   . GERD (gastroesophageal reflux disease)    takes Omeprazole daily  . History of bronchitis 01/2014  . History of colon polyps   . Hyperlipidemia    has been off of meds d/t getting sick from them.Not been addressed again.   Marland Kitchen Hypertension    takes Ziac daily  . Nocturia   . Pneumonia    hx of-8+yrs ago  . PONV (postoperative nausea and vomiting)   . Shortness of breath dyspnea    thinks d/t pain meds and notices with exertion  . Stroke ([place])    16 yrs ago--left eye    Past Surgical History:  Procedure Laterality Date  . ABDOMINAL HYSTERECTOMY  1983   partial   . ADENOIDECTOMY    . ANTERIOR (CYSTOCELE) AND POSTERIOR REPAIR (RECTOCELE) WITH XENFORM GRAFT AND SACROSPINOUS FIXATION    . APPENDECTOMY  1967  . BREAST SURGERY Right    diseased milk glands  . CHOLECYSTECTOMY  1983  . COLONOSCOPY    . DIAGNOSTIC LAPAROSCOPY     cyst removed from ovaries   . ESOPHAGOGASTRODUODENOSCOPY    . NECK SURGERY  [phone]   c spine ant and post hx fusion and removal or harcdware and shavings  . rods and screws in lumbar    . TONSILLECTOMY     Social History   Socioeconomic History  . Marital status: Widowed    Spouse name: Not on file  . Number of children: 2  . Years of education: Not on file  . Highest education level:  Not on file  Occupational History  . Occupation: retired    Fish farm manager: Scientist, research (physical sciences)  Social Needs  . Financial resource strain: Not on file  . Food insecurity:    Worry: Not on file    Inability: Not on file  . Transportation needs:    Medical: Not on file    Non-medical: Not on file  Tobacco Use  . Smoking status: Former Smoker    Packs/day: 0.25    Years: 44.00    Pack years: 11.00    Types: Cigarettes    Last attempt to quit: 02/04/2014    Years since quitting: 3.8  . Smokeless tobacco: Never Used  . Tobacco comment: quit smoking in Dec 2015  Substance and Sexual Activity  . Alcohol use: Yes    Alcohol/week: 4.0 standard drinks    Types: 4 Standard drinks or equivalent per week    Comment: couple times a week  . Drug use: No  . Sexual activity: Not on file  Lifestyle  . Physical activity:    Days per week: Not on file    Minutes per session: Not on file  . Stress: Not on file  Relationships  . Social connections:    Talks  on phone: Not on file    Gets together: Not on file    Attends religious service: Not on file    Active member of club or organization: Not on file    Attends meetings of clubs or organizations: Not on file    Relationship status: Not on file  Other Topics Concern  . Not on file  Social History Narrative   6-8 hours of sleep per night   Lives with her fiance hh of 2 no pets    1 dog in the home   Retired no ets  But tob 8 per day   etoh ocass 1-2    On disability from her neck surgery predicaments .   orig from [employer] in [place] area.    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 2   Last Pap 2006 last mammogram 2000 and   Has dentures   FA    Allergies  Allergen Reactions  . Wellbutrin [Bupropion] Nausea And Vomiting   Family History  Problem Relation Age of Onset  . CAD Father        tumor between heart and lung   . Hypertension Father   . CAD Sister        ? dx   . Hypertension Mother   . Lung cancer Mother         died 25   . Diabetes Maternal Grandmother        late 50s      Current Outpatient Medications (Cardiovascular):  .  bisoprolol-hydrochlorothiazide (ZIAC) 5-6.25 MG tablet, TAKE 1 TABLET BY MOUTH ONCE DAILY  Current Outpatient Medications (Respiratory):  .  albuterol (PROVENTIL HFA;VENTOLIN HFA) 108 (90 Base) MCG/ACT inhaler, Inhale 1-2 puffs into the lungs every 6 (six) hours as needed for wheezing or shortness of breath. Avoid regular use .  budesonide-formoterol (SYMBICORT) 160-4.5 MCG/ACT inhaler, Inhale 2 puffs into the lungs 2 (two) times daily. As needed .  cetirizine (ZYRTEC) 10 MG tablet, Take 10 mg by mouth daily.    Current Outpatient Medications (Other):  .  gabapentin (NEURONTIN) 100 MG capsule, TAKE 2 CAPSULES BY MOUTH AT BEDTIME .  Multiple Vitamins-Minerals (CENTRUM PO), Take 1 tablet by mouth daily.  Marland Kitchen  omeprazole (PRILOSEC) 20 MG capsule, TAKE 1 CAPSULE BY MOUTH ONCE DAILY .  venlafaxine XR (EFFEXOR-XR) 37.5 MG 24 hr capsule, Take 2 capsules (75 mg total) by mouth daily with breakfast.    Past medical history, social, surgical and family history all reviewed in electronic medical record.  No pertanent information unless stated regarding to the chief complaint.   Review of Systems:  No headache, visual changes, nausea, vomiting, diarrhea, constipation, dizziness, abdominal pain, skin rash, fevers, chills, night sweats, weight loss, swollen lymph nodes, body aches, joint swelling, muscle aches, chest pain, shortness of breath, mood changes.   Objective  There were no vitals taken for this visit. Systems examined below as of    General: No apparent distress alert and oriented x3 mood and affect normal, dressed appropriately.  HEENT: Pupils equal, extraocular movements intact  Respiratory: Patient's speak in full sentences and does not appear short of breath  Cardiovascular: No lower extremity edema, non tender, no erythema  Skin: Warm dry intact with no  signs of infection or rash on extremities or on axial skeleton.  Abdomen: Soft nontender  Neuro: Cranial nerves II through XII are intact, neurovascularly intact in all extremities with 2+ DTRs and 2+ pulses.  Lymph:  No lymphadenopathy of posterior or anterior cervical chain or axillae bilaterally.  Gait normal with good balance and coordination.  MSK:  Non tender with full range of motion and good stability and symmetric strength and tone of shoulders, elbows, wrist, hip, knee and ankles bilaterally.     Impression and Recommendations:     This case required medical decision making of moderate complexity. The above documentation has been reviewed and is accurate and complete Lyndal Pulley, DO       Note: This dictation was prepared with Dragon dictation along with smaller phrase technology. Any transcriptional errors that result from this process are unintentional.

## 2017-12-09 ENCOUNTER — Ambulatory Visit: Payer: Medicare HMO | Admitting: Family Medicine

## 2017-12-09 DIAGNOSIS — Z0289 Encounter for other administrative examinations: Secondary | ICD-10-CM

## 2017-12-11 ENCOUNTER — Other Ambulatory Visit: Payer: Self-pay | Admitting: Internal Medicine

## 2017-12-14 ENCOUNTER — Encounter (HOSPITAL_COMMUNITY): Payer: Self-pay | Admitting: Family Medicine

## 2017-12-14 ENCOUNTER — Ambulatory Visit (HOSPITAL_COMMUNITY)
Admission: EM | Admit: 2017-12-14 | Discharge: 2017-12-14 | Disposition: A | Discharge status: Home / Self Care | Payer: Medicare HMO | Attending: Family Medicine | Admitting: Family Medicine

## 2017-12-14 ENCOUNTER — Other Ambulatory Visit: Payer: Self-pay

## 2017-12-14 DIAGNOSIS — J441 Chronic obstructive pulmonary disease with (acute) exacerbation: Secondary | ICD-10-CM | POA: Diagnosis not present

## 2017-12-14 MED ORDER — HYDROCODONE-HOMATROPINE 5-1.5 MG/5ML PO SYRP: 5.0000 mL | ORAL_SOLUTION | Freq: Four times a day (QID) | ORAL | 0 refills | Status: DC | PRN

## 2017-12-14 MED ORDER — AMOXICILLIN-POT CLAVULANATE 875-125 MG PO TABS: 1.0000 | ORAL_TABLET | Freq: Two times a day (BID) | ORAL | 0 refills | Status: DC

## 2017-12-14 MED ORDER — PREDNISONE 20 MG PO TABS: ORAL_TABLET | ORAL | 0 refills | Status: DC

## 2017-12-14 NOTE — ED Provider Notes (Signed)
[facility]    CSN: [id number] Arrival date & time: 12/14/17  1001     History   Chief Complaint Chief Complaint  Patient presents with  . URI    HPI Samantha Hinton is a 62 y.o. female.   This 62 year old woman is making an established visit to the [facility] urgent care center in regards to cold symptoms.  Patient has COPD, Gold II, with emphysema and chronic SOB.  Patient gave up smoking 5 years ago.  She had a bad COPD exacerbation which prompted her to quit.  Patient's had a sore throat for a week now.  At first she thought she could weather the storm but things have gotten worse with more shortness of breath and wheezing.  She is also run a fever up to 100.  She has had some nausea but no vomiting.     Past Medical History:  Diagnosis Date  . Chronic back pain    buldging disc and tumor.Spondylolisthesis  . COPD (chronic obstructive pulmonary disease) (HCC)    Symbicort daily and ProAir as needed  . Emphysema   . Emphysema of lung ([place])   . Fibromyalgia   . Genital warts   . GERD (gastroesophageal reflux disease)    takes Omeprazole daily  . History of bronchitis 01/2014  . History of colon polyps   . Hyperlipidemia    has been off of meds d/t getting sick from them.Not been addressed again.   Marland Kitchen Hypertension    takes Ziac daily  . Nocturia   . Pneumonia    hx of-8+yrs ago  . PONV (postoperative nausea and vomiting)   . Shortness of breath dyspnea    thinks d/t pain meds and notices with exertion  . Stroke [facility])    16 yrs ago--left eye     Patient Active Problem List   Diagnosis Date Noted  . Reflex sympathetic dystrophy of right lower extremity 09/14/2017  . Right ankle pain 08/19/2017  . Abnormal LFTs 07/07/2015  . Spondylolisthesis of lumbar region 01/04/2015  . Colon cancer screening 05/07/2014  . Nausea without vomiting 03/20/2014  . Family history of lung cancer 12/01/2013  . Family history of heart disease  12/01/2013  . Hyperlipidemia 12/01/2013  . COPD with acute exacerbation ([place]) 08/28/2013  . Other and unspecified hyperlipidemia 07/16/2013  . Bruising 07/16/2013  . Intentional underdosing of medication regimen by patient due to financial hardship 06/16/2013  . Adjustment reaction with anxiety 06/16/2013  . Adjustment disorder with anxious mood 06/16/2013  . Nausea alone 06/16/2013  . Left breast lump 06/16/2013  . H/O cervical spine surgery 06/16/2013  . COPD GOLD II 04/12/2012  . Essential hypertension, benign 04/12/2012  . SOB (shortness of breath) 02/17/2012  . Hypoxemia 02/17/2012    Past Surgical History:  Procedure Laterality Date  . ABDOMINAL HYSTERECTOMY  1983   partial   . ADENOIDECTOMY    . ANTERIOR (CYSTOCELE) AND POSTERIOR REPAIR (RECTOCELE) WITH XENFORM GRAFT AND SACROSPINOUS FIXATION    . APPENDECTOMY  1967  . BREAST SURGERY Right    diseased milk glands  . CHOLECYSTECTOMY  1983  . COLONOSCOPY    . DIAGNOSTIC LAPAROSCOPY     cyst removed from ovaries   . ESOPHAGOGASTRODUODENOSCOPY    . NECK SURGERY  [phone]   c spine ant and post hx fusion and removal or harcdware and shavings  . rods and screws in lumbar    . TONSILLECTOMY  OB History    Gravida  3   Para  2   Term  2   Preterm      AB      Living        SAB      TAB      Ectopic      Multiple      Live Births               Home Medications    Prior to Admission medications   Medication Sig Start Date End Date Taking? Authorizing Provider  albuterol (PROVENTIL HFA;VENTOLIN HFA) 108 (90 Base) MCG/ACT inhaler Inhale 1-2 puffs into the lungs every 6 (six) hours as needed for wheezing or shortness of breath. Avoid regular use 08/12/17   Panosh, Stand[place] Brooking, MD  amoxicillin-clavulanate (AUGMENTIN) 875-125 MG tablet Take 1 tablet by mouth every 12 (twelve) hours. 12/14/17   Robyn Haber, MD  bisoprolol-hydrochlorothiazide (ZIAC) 5-6.25 MG tablet TAKE 1 TABLET BY MOUTH ONCE  DAILY 12/11/17   Panosh, Standley Brooking, MD  budesonide-formoterol (SYMBICORT) 160-4.5 MCG/ACT inhaler Inhale 2 puffs into the lungs 2 (two) times daily. As needed    [provider]  cetirizine (ZYRTEC) 10 MG tablet Take 10 mg by mouth daily.    [provider]  gabapentin (NEURONTIN) 100 MG capsule TAKE 2 CAPSULES BY MOUTH AT BEDTIME 12/04/17   Lyndal Pulley, DO  HYDROcodone-homatropine (HYDROMET) 5-1.5 MG/5ML syrup Take 5 mLs by mouth every 6 (six) hours as needed for cough. 12/14/17   Robyn Haber, MD  Multiple Vitamins-Minerals (CENTRUM PO) Take 1 tablet by mouth daily.     [provider]  omeprazole (PRILOSEC) 20 MG capsule TAKE 1 CAPSULE BY MOUTH ONCE DAILY 07/09/17   Panosh, Standley Brooking, MD  predniSONE (DELTASONE) 20 MG tablet Take 3 PO QAM x3days, 2 PO QAM x3days, 1 PO QAM x3days 12/14/17   Robyn Haber, MD  venlafaxine XR (EFFEXOR-XR) 37.5 MG 24 hr capsule Take 2 capsules (75 mg total) by mouth daily with breakfast. 11/14/17   Lyndal Pulley, DO    Family History Family History  Problem Relation Age of Onset  . CAD Father        tumor between heart and lung   . Hypertension Father   . CAD Sister        ? dx   . Hypertension Mother   . Lung cancer Mother        died 43   . Diabetes Maternal Grandmother        late 22s     Social History Social History   Tobacco Use  . Smoking status: Former Smoker    Packs/day: 0.25    Years: 44.00    Pack years: 11.00    Types: Cigarettes    Last attempt to quit: 02/04/2014    Years since quitting: 3.8  . Smokeless tobacco: Never Used  . Tobacco comment: quit smoking in Dec 2015  Substance Use Topics  . Alcohol use: Yes    Alcohol/week: 4.0 standard drinks    Types: 4 Standard drinks or equivalent per week    Comment: couple times a week  . Drug use: No     Allergies   Wellbutrin [bupropion]   Review of Systems Review of Systems  Constitutional: Positive for fatigue and fever.  HENT:  Positive for sore throat.   Respiratory: Positive for chest tightness, shortness of breath and wheezing.   Gastrointestinal: Positive for nausea.  All  other systems reviewed and are negative.    Physical Exam Triage Vital Signs ED Triage Vitals  Enc Vitals Group     BP      Pulse      Resp      Temp      Temp src      SpO2      Weight      Height      Head Circumference      Peak Flow      Pain Score      Pain Loc      Pain Edu?      Excl. in [place]?    No data found.  Updated Vital Signs BP (!) 164/95 (BP Location: Left Arm)   Pulse (!) 101   Temp 98.7 F (37.1 C) (Oral)   Resp 20   SpO2 96%   Physical Exam  Constitutional: She is oriented to person, place, and time. She appears well-developed and well-nourished.  HENT:  Right Ear: External ear normal.  Left Ear: External ear normal.  Mouth/Throat: Oropharynx is clear and moist.  Eyes: Pupils are equal, round, and reactive to light. Conjunctivae are normal.  Neck: Normal range of motion.  Pulmonary/Chest: She is in respiratory distress. She has wheezes.  Short of breath with walking  Musculoskeletal: Normal range of motion.  Neurological: She is alert and oriented to person, place, and time.  Skin: Skin is warm and dry.  Psychiatric: She has a normal mood and affect.  Nursing note and vitals reviewed.    UC Treatments / Results  Labs (all labs ordered are listed, but only abnormal results are displayed) Labs Reviewed - No data to display  EKG None  Radiology No results found.  Procedures Procedures (including critical care time)  Medications Ordered in UC Medications - No data to display  Initial Impression / Assessment and Plan / UC Course  I have reviewed the triage vital signs and the nursing notes.  Pertinent labs & imaging results that were available during my care of the patient were reviewed by me and considered in my medical decision making (see chart for details).    Final Clinical  Impressions(s) / UC Diagnoses   Final diagnoses:  COPD exacerbation [facility])   Discharge Instructions   None    ED Prescriptions    Medication Sig Dispense Auth. Provider   predniSONE (DELTASONE) 20 MG tablet Take 3 PO QAM x3days, 2 PO QAM x3days, 1 PO QAM x3days 18 tablet Robyn Haber, MD   HYDROcodone-homatropine (HYDROMET) 5-1.5 MG/5ML syrup Take 5 mLs by mouth every 6 (six) hours as needed for cough. 60 mL Robyn Haber, MD   amoxicillin-clavulanate (AUGMENTIN) 875-125 MG tablet Take 1 tablet by mouth every 12 (twelve) hours. 14 tablet Robyn Haber, MD     Controlled Substance Prescriptions [place] Controlled Substance Registry consulted? Not Applicable   Robyn Haber, MD 12/14/17 1049

## 2017-12-14 NOTE — ED Triage Notes (Signed)
onset Sunday night of green phlegm, cough, chills, fever 100.

## 2018-01-19 ENCOUNTER — Other Ambulatory Visit: Payer: Self-pay | Admitting: Internal Medicine

## 2018-03-24 ENCOUNTER — Other Ambulatory Visit: Payer: Self-pay | Admitting: Family Medicine

## 2018-04-20 ENCOUNTER — Other Ambulatory Visit: Payer: Self-pay | Admitting: Internal Medicine

## 2018-04-20 ENCOUNTER — Other Ambulatory Visit: Payer: Self-pay | Admitting: Family Medicine

## 2018-04-20 DIAGNOSIS — R110 Nausea: Secondary | ICD-10-CM

## 2018-05-19 ENCOUNTER — Other Ambulatory Visit: Payer: Self-pay | Admitting: Internal Medicine

## 2018-05-19 ENCOUNTER — Other Ambulatory Visit: Payer: Self-pay | Admitting: Family Medicine

## 2018-05-20 NOTE — Telephone Encounter (Signed)
Left message to call back for a virtual visit in order to refill.

## 2018-05-20 NOTE — Telephone Encounter (Signed)
Pt called and left voicemail on [place].  States she is returning call.  Pt can be reached at [phone].

## 2018-05-21 ENCOUNTER — Encounter: Payer: Self-pay | Admitting: Family Medicine

## 2018-05-21 ENCOUNTER — Ambulatory Visit (INDEPENDENT_AMBULATORY_CARE_PROVIDER_SITE_OTHER): Payer: Medicare Other | Admitting: Family Medicine

## 2018-05-21 DIAGNOSIS — G90521 Complex regional pain syndrome I of right lower limb: Secondary | ICD-10-CM | POA: Diagnosis not present

## 2018-05-21 DIAGNOSIS — M4316 Spondylolisthesis, lumbar region: Secondary | ICD-10-CM | POA: Diagnosis not present

## 2018-05-21 MED ORDER — GABAPENTIN 300 MG PO CAPS: ORAL_CAPSULE | ORAL | 3 refills | Status: DC

## 2018-05-21 MED ORDER — VENLAFAXINE HCL ER 75 MG PO CP24: 75.0000 mg | ORAL_CAPSULE | Freq: Every day | ORAL | 3 refills | Status: DC

## 2018-05-21 NOTE — Progress Notes (Signed)
[facility] [place], [place] [address] Phone: [phone] Subjective:   Virtual Visit via Video Note  I connected with Samantha Hinton on 05/21/18 at 10:30 AM EDT by a video enabled telemedicine application and verified that I am speaking with the correct person using two identifiers.   I discussed the limitations of evaluation and management by telemedicine and the availability of in person appointments. The patient expressed understanding and agreed to proceed.      I discussed the assessment and treatment plan with the patient. The patient was provided an opportunity to ask questions and all were answered. The patient agreed with the plan and demonstrated an understanding of the instructions.   The patient was advised to call back or seek an in-person evaluation if the symptoms worsen or if the condition fails to improve as anticipated.  I provided 26 minutes of non-face-to-face time during this encounter via video platform    Lyndal Pulley, DO     CC: Reflex and pathetic dystrophy, right ankle pain and back pain follow-up  [id number]  Samantha Hinton is a 63 y.o. female coming in with complaint of back pain and ankle pain.  Patient was seen previously for what appeared to be more of a reflex and pathetic dystrophy.  Was put on Effexor.  Has done very well.  We attempted to increase her back in September but patient was having some side effects.  Went back down to 37.5 mg.  Has been doing well but feels like she needs more the medication.  Gabapentin has helped her at night but also feels like this could be a little higher dose.  Patient feels also secondary to the coronavirus outbreak she is concerned about coming to any office visits but has like this video platform.     Past Medical History:  Diagnosis Date  . Chronic back pain    buldging disc and tumor.Spondylolisthesis  . COPD (chronic obstructive pulmonary disease) (HCC)     Symbicort daily and ProAir as needed  . Emphysema   . Emphysema of lung ([place])   . Fibromyalgia   . Genital warts   . GERD (gastroesophageal reflux disease)    takes Omeprazole daily  . History of bronchitis 01/2014  . History of colon polyps   . Hyperlipidemia    has been off of meds d/t getting sick from them.Not been addressed again.   Marland Kitchen Hypertension    takes Ziac daily  . Nocturia   . Pneumonia    hx of-8+yrs ago  . PONV (postoperative nausea and vomiting)   . Shortness of breath dyspnea    thinks d/t pain meds and notices with exertion  . Stroke ([place])    16 yrs ago--left eye    Past Surgical History:  Procedure Laterality Date  . ABDOMINAL HYSTERECTOMY  1983   partial   . ADENOIDECTOMY    . ANTERIOR (CYSTOCELE) AND POSTERIOR REPAIR (RECTOCELE) WITH XENFORM GRAFT AND SACROSPINOUS FIXATION    . APPENDECTOMY  1967  . BREAST SURGERY Right    diseased milk glands  . CHOLECYSTECTOMY  1983  . COLONOSCOPY    . DIAGNOSTIC LAPAROSCOPY     cyst removed from ovaries   . ESOPHAGOGASTRODUODENOSCOPY    . NECK SURGERY  [phone]   c spine ant and post hx fusion and removal or harcdware and shavings  . rods and screws in lumbar    . TONSILLECTOMY     Social  History   Socioeconomic History  . Marital status: Widowed    Spouse name: Not on file  . Number of children: 2  . Years of education: Not on file  . Highest education level: Not on file  Occupational History  . Occupation: retired    Fish farm manager: Scientist, research (physical sciences)  Social Needs  . Financial resource strain: Not on file  . Food insecurity:    Worry: Not on file    Inability: Not on file  . Transportation needs:    Medical: Not on file    Non-medical: Not on file  Tobacco Use  . Smoking status: Former Smoker    Packs/day: 0.25    Years: 44.00    Pack years: 11.00    Types: Cigarettes    Last attempt to quit: 02/04/2014    Years since quitting: 4.2  . Smokeless tobacco: Never Used  . Tobacco comment: quit  smoking in Dec 2015  Substance and Sexual Activity  . Alcohol use: Yes    Alcohol/week: 4.0 standard drinks    Types: 4 Standard drinks or equivalent per week    Comment: couple times a week  . Drug use: No  . Sexual activity: Not on file  Lifestyle  . Physical activity:    Days per week: Not on file    Minutes per session: Not on file  . Stress: Not on file  Relationships  . Social connections:    Talks on phone: Not on file    Gets together: Not on file    Attends religious service: Not on file    Active member of club or organization: Not on file    Attends meetings of clubs or organizations: Not on file    Relationship status: Not on file  Other Topics Concern  . Not on file  Social History Narrative   6-8 hours of sleep per night   Lives with her fiance hh of 2 no pets    1 dog in the home   Retired no ets  But tob 8 per day   etoh ocass 1-2    On disability from her neck surgery predicaments .   orig from [employer] in [place] area.    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 2   Last Pap 2006 last mammogram 2000 and   Has dentures   FA    Allergies  Allergen Reactions  . Wellbutrin [Bupropion] Nausea And Vomiting   Family History  Problem Relation Age of Onset  . CAD Father        tumor between heart and lung   . Hypertension Father   . CAD Sister        ? dx   . Hypertension Mother   . Lung cancer Mother        died 52   . Diabetes Maternal Grandmother        late 75s     Current Outpatient Medications (Endocrine & Metabolic):  .  predniSONE (DELTASONE) 20 MG tablet, Take 3 PO QAM x3days, 2 PO QAM x3days, 1 PO QAM x3days  Current Outpatient Medications (Cardiovascular):  .  bisoprolol-hydrochlorothiazide (ZIAC) 5-6.25 MG tablet, TAKE 1 TABLET BY MOUTH ONCE DAILY  Current Outpatient Medications (Respiratory):  .  albuterol (PROVENTIL HFA;VENTOLIN HFA) 108 (90 Base) MCG/ACT inhaler, Inhale 1-2 puffs into the lungs every 6 (six)  hours as needed for wheezing or shortness of breath. Avoid regular use .  budesonide-formoterol (SYMBICORT) 160-4.5 MCG/ACT  inhaler, Inhale 2 puffs by mouth twice daily .  cetirizine (ZYRTEC) 10 MG tablet, Take 10 mg by mouth daily. Marland Kitchen  HYDROcodone-homatropine (HYDROMET) 5-1.5 MG/5ML syrup, Take 5 mLs by mouth every 6 (six) hours as needed for cough.    Current Outpatient Medications (Other):  .  amoxicillin-clavulanate (AUGMENTIN) 875-125 MG tablet, Take 1 tablet by mouth every 12 (twelve) hours. .  gabapentin (NEURONTIN) 100 MG capsule, TAKE 2 CAPSULES BY MOUTH AT BEDTIME .  Multiple Vitamins-Minerals (CENTRUM PO), Take 1 tablet by mouth daily.  Marland Kitchen  omeprazole (PRILOSEC) 20 MG capsule, Take 1 capsule by mouth once daily .  venlafaxine XR (EFFEXOR-XR) 37.5 MG 24 hr capsule, TAKE 2 CAPSULES BY MOUTH ONCE DAILY WITH BREAKFAST    Past medical history, social, surgical and family history all reviewed in electronic medical record.  No pertanent information unless stated regarding to the chief complaint.   Review of Systems:  No headache, visual changes, nausea, vomiting, diarrhea, constipation, dizziness, abdominal pain, skin rash, fevers, chills, night sweats, weight loss, swollen lymph nodes, body aches, , chest pain, shortness of breath, mood changes.  Positive muscle aches and joint swelling  Objective     General: No apparent distress alert and oriented x3 mood and affect normal, dressed appropriately.      Impression and Recommendations:     . The above documentation has been reviewed and is accurate and complete Lyndal Pulley, DO       Note: This dictation was prepared with Dragon dictation along with smaller phrase technology. Any transcriptional errors that result from this process are unintentional.

## 2018-05-21 NOTE — Telephone Encounter (Signed)
Not filled by Dr.Panosh

## 2018-05-21 NOTE — Assessment & Plan Note (Addendum)
Reflex and pathetic dystrophy.  Discussed which activities of doing which also avoid discussed which activities to do which wants to avoid.  Discussed the new medications and patient was sent in 75 mg of Effexor and 300 mg of gabapentin.  Do not think that this will be beneficial.  Follow-up again in 4 weeks we will do virtual visit.

## 2018-06-17 NOTE — Progress Notes (Signed)
[name] [address] [place], [place] [address] Phone: [phone] Subjective:    Virtual Visit via Video Note  I connected with [name] on 06/18/18 at  8:45 AM EDT by a video enabled telemedicine application and verified that I am speaking with the correct person using two identifiers.   I discussed the limitations of evaluation and management by telemedicine and the availability of in person appointments. The patient expressed understanding and agreed to proceed.  Patient was in her home residence and I was in office setting for the virtual platform visit.    I discussed the assessment and treatment plan with the patient. The patient was provided an opportunity to ask questions and all were answered. The patient agreed with the plan and demonstrated an understanding of the instructions.   The patient was advised to call back or seek an in-person evaluation if the symptoms worsen or if the condition fails to improve as anticipated.  I provided 26 minutes of face-to-face time during this encounter.   Lyndal Pulley, DO    CC: Back and right ankle pain follow-up  [id number]  Samantha Hinton is a 63 y.o. female coming in with complaint of back pain and right ankle pain.  Patient is on a visual platform.  Patient states that it is feeling better overall.  Patient states that about 70 to 80% better with increasing the gabapentin as well as the Effexor.  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  Had some mild sweating with the higher dose of the Effexor initially but now no side effects.  Attempting to lose weight and is hoping that this will be beneficial as well.  Happy with the result so far.     Past Medical History:  Diagnosis Date  . Chronic back pain    buldging disc and tumor.Spondylolisthesis  . COPD (chronic obstructive pulmonary disease) (HCC)    Symbicort daily and ProAir as needed  . Emphysema   .  Emphysema of lung ([place])   . Fibromyalgia   . Genital warts   . GERD (gastroesophageal reflux disease)    takes Omeprazole daily  . History of bronchitis 01/2014  . History of colon polyps   . Hyperlipidemia    has been off of meds d/t getting sick from them.Not been addressed again.   Marland Kitchen Hypertension    takes Ziac daily  . Nocturia   . Pneumonia    hx of-8+yrs ago  . PONV (postoperative nausea and vomiting)   . Shortness of breath dyspnea    thinks d/t pain meds and notices with exertion  . Stroke ([place])    16 yrs ago--left eye    Past Surgical History:  Procedure Laterality Date  . ABDOMINAL HYSTERECTOMY  1983   partial   . ADENOIDECTOMY    . ANTERIOR (CYSTOCELE) AND POSTERIOR REPAIR (RECTOCELE) WITH XENFORM GRAFT AND SACROSPINOUS FIXATION    . APPENDECTOMY  1967  . BREAST SURGERY Right    diseased milk glands  . CHOLECYSTECTOMY  1983  . COLONOSCOPY    . DIAGNOSTIC LAPAROSCOPY     cyst removed from ovaries   . ESOPHAGOGASTRODUODENOSCOPY    . NECK SURGERY  [phone]   c spine ant and post hx fusion and removal or harcdware and shavings  . rods and screws in lumbar    . TONSILLECTOMY     Social History   Socioeconomic History  . Marital status: Widowed  Spouse name: Not on file  . Number of children: 2  . Years of education: Not on file  . Highest education level: Not on file  Occupational History  . Occupation: retired    Fish farm manager: Scientist, research (physical sciences)  Social Needs  . Financial resource strain: Not on file  . Food insecurity:    Worry: Not on file    Inability: Not on file  . Transportation needs:    Medical: Not on file    Non-medical: Not on file  Tobacco Use  . Smoking status: Former Smoker    Packs/day: 0.25    Years: 44.00    Pack years: 11.00    Types: Cigarettes    Last attempt to quit: 02/04/2014    Years since quitting: 4.3  . Smokeless tobacco: Never Used  . Tobacco comment: quit smoking in Dec 2015  Substance and Sexual Activity  . Alcohol  use: Yes    Alcohol/week: 4.0 standard drinks    Types: 4 Standard drinks or equivalent per week    Comment: couple times a week  . Drug use: No  . Sexual activity: Not on file  Lifestyle  . Physical activity:    Days per week: Not on file    Minutes per session: Not on file  . Stress: Not on file  Relationships  . Social connections:    Talks on phone: Not on file    Gets together: Not on file    Attends religious service: Not on file    Active member of club or organization: Not on file    Attends meetings of clubs or organizations: Not on file    Relationship status: Not on file  Other Topics Concern  . Not on file  Social History Narrative   6-8 hours of sleep per night   Lives with her fiance hh of 2 no pets    1 dog in the home   Retired no ets  But tob 8 per day   etoh ocass 1-2    On disability from her neck surgery predicaments .   orig from [employer] in [place] area.    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 2   Last Pap 2006 last mammogram 2000 and   Has dentures   FA    Allergies  Allergen Reactions  . Wellbutrin [Bupropion] Nausea And Vomiting   Family History  Problem Relation Age of Onset  . CAD Father        tumor between heart and lung   . Hypertension Father   . CAD Sister        ? dx   . Hypertension Mother   . Lung cancer Mother        died 54   . Diabetes Maternal Grandmother        late 60s     Current Outpatient Medications (Endocrine & Metabolic):  .  predniSONE (DELTASONE) 20 MG tablet, Take 3 PO QAM x3days, 2 PO QAM x3days, 1 PO QAM x3days  Current Outpatient Medications (Cardiovascular):  .  bisoprolol-hydrochlorothiazide (ZIAC) 5-6.25 MG tablet, TAKE 1 TABLET BY MOUTH ONCE DAILY  Current Outpatient Medications (Respiratory):  .  albuterol (PROVENTIL HFA;VENTOLIN HFA) 108 (90 Base) MCG/ACT inhaler, Inhale 1-2 puffs into the lungs every 6 (six) hours as needed for wheezing or shortness of breath. Avoid regular  use .  budesonide-formoterol (SYMBICORT) 160-4.5 MCG/ACT inhaler, Inhale 2 puffs by mouth twice daily .  cetirizine (ZYRTEC) 10  MG tablet, Take 10 mg by mouth daily. Marland Kitchen  HYDROcodone-homatropine (HYDROMET) 5-1.5 MG/5ML syrup, Take 5 mLs by mouth every 6 (six) hours as needed for cough.    Current Outpatient Medications (Other):  .  amoxicillin-clavulanate (AUGMENTIN) 875-125 MG tablet, Take 1 tablet by mouth every 12 (twelve) hours. .  gabapentin (NEURONTIN) 300 MG capsule, 1 pill nightly .  Multiple Vitamins-Minerals (CENTRUM PO), Take 1 tablet by mouth daily.  Marland Kitchen  omeprazole (PRILOSEC) 20 MG capsule, Take 1 capsule by mouth once daily .  venlafaxine XR (EFFEXOR XR) 75 MG 24 hr capsule, Take 1 capsule (75 mg total) by mouth daily with breakfast.    Past medical history, social, surgical and family history all reviewed in electronic medical record.  No pertanent information unless stated regarding to the chief complaint.   Review of Systems:  No headache, visual changes, nausea, vomiting, diarrhea, constipation, dizziness, abdominal pain, skin rash, fevers, chills, night sweats, weight loss, swollen lymph nodes, body aches, joint swelling,  chest pain, shortness of breath, mood changes.  Positive muscle aches  Objective     General: No apparent distress alert and oriented x3 mood and affect normal, dressed appropriately.      Impression and Recommendations:     The above documentation has been reviewed and is accurate and complete Lyndal Pulley, DO       Note: This dictation was prepared with Dragon dictation along with smaller phrase technology. Any transcriptional errors that result from this process are unintentional.

## 2018-06-18 ENCOUNTER — Encounter: Payer: Self-pay | Admitting: Family Medicine

## 2018-06-18 ENCOUNTER — Ambulatory Visit (INDEPENDENT_AMBULATORY_CARE_PROVIDER_SITE_OTHER): Payer: Medicare Other | Admitting: Family Medicine

## 2018-06-18 DIAGNOSIS — M4316 Spondylolisthesis, lumbar region: Secondary | ICD-10-CM | POA: Diagnosis not present

## 2018-06-18 DIAGNOSIS — G90521 Complex regional pain syndrome I of right lower limb: Secondary | ICD-10-CM | POA: Diagnosis not present

## 2018-06-18 NOTE — Assessment & Plan Note (Signed)
Significant improvement with the Effexor.  No change at this moment.  Follow-up again as needed.

## 2018-06-18 NOTE — Assessment & Plan Note (Signed)
Stable.  Encourage weight loss and continuing the gabapentin.  No change in medication.  Will give 1 year supply of medications.  As long as patient is well follow-up as needed

## 2018-07-28 ENCOUNTER — Other Ambulatory Visit: Payer: Self-pay | Admitting: Internal Medicine

## 2018-07-28 DIAGNOSIS — R110 Nausea: Secondary | ICD-10-CM

## 2018-08-07 ENCOUNTER — Telehealth: Payer: Self-pay | Admitting: Internal Medicine

## 2018-08-07 NOTE — Telephone Encounter (Signed)
Called and spoke with pt who stated she has had worsening breathing x3 weeks. Pt is using symbicort daily as prescribed. Pt said she has not had to use the albuterol inhaler.  Pt has used her nebulizer at least once daily to see if it would help with her breathing.  Pt denies any complaints of cough or congestion. Pt also denies any complaints of fever or chills.   Pt does have joint pain due to gaining about 50 pounds over a 7 month time frame due to quarantine from [place].  Pt has not done any recent traveling, has not been around anyone that has been sick or exposed to [place]. Pt has not been tested for COVID either.   Due to pt's breathing worsening, pt would like to schedule an appt. Pt has not been seen at office since 08/13/14. Dr. Melvyn Novas, please advise if you would be fine for [place] to schedule an in office visit for pt or if we need to make the visit a televisit? Thanks!

## 2018-08-07 NOTE — Telephone Encounter (Signed)
Ov with all meds in hand

## 2018-08-07 NOTE — Telephone Encounter (Signed)
Returned call to patient Scheduled ov with MW Offered sooner appt with NP pt declined. Informed patient if sob gets worse go to ED. She verbalized understanding states she is overdue for f/u. Nothing further needed.

## 2018-08-07 NOTE — Telephone Encounter (Signed)
Attempted to call pt to see if we could get her scheduled for OV with MW but unable to reach pt. Left message for pt to return call.

## 2018-08-11 ENCOUNTER — Ambulatory Visit (INDEPENDENT_AMBULATORY_CARE_PROVIDER_SITE_OTHER): Payer: Medicare Other

## 2018-08-11 ENCOUNTER — Ambulatory Visit: Payer: Medicare Other | Admitting: Internal Medicine

## 2018-08-11 ENCOUNTER — Encounter: Payer: Self-pay | Admitting: Internal Medicine

## 2018-08-11 ENCOUNTER — Other Ambulatory Visit: Payer: Self-pay

## 2018-08-11 DIAGNOSIS — R0609 Other forms of dyspnea: Secondary | ICD-10-CM | POA: Diagnosis not present

## 2018-08-11 DIAGNOSIS — R0602 Shortness of breath: Secondary | ICD-10-CM | POA: Diagnosis not present

## 2018-08-11 DIAGNOSIS — J449 Chronic obstructive pulmonary disease, unspecified: Secondary | ICD-10-CM | POA: Diagnosis not present

## 2018-08-11 LAB — CBC WITH DIFFERENTIAL/PLATELET
Basophils Absolute: 0 10*3/uL (ref 0.0–0.1)
Basophils Relative: 0.5 % (ref 0.0–3.0)
Eosinophils Absolute: 0.1 10*3/uL (ref 0.0–0.7)
Eosinophils Relative: 1.9 % (ref 0.0–5.0)
HCT: 41.1 % (ref 36.0–46.0)
Hemoglobin: 13.6 g/dL (ref 12.0–15.0)
Lymphocytes Relative: 27.8 % (ref 12.0–46.0)
Lymphs Abs: 1.9 10*3/uL (ref 0.7–4.0)
MCHC: 33.2 g/dL (ref 30.0–36.0)
MCV: 92.4 fl (ref 78.0–100.0)
Monocytes Absolute: 0.5 10*3/uL (ref 0.1–1.0)
Monocytes Relative: 6.7 % (ref 3.0–12.0)
Neutro Abs: 4.4 10*3/uL (ref 1.4–7.7)
Neutrophils Relative %: 63.1 % (ref 43.0–77.0)
Platelets: 199 10*3/uL (ref 150.0–400.0)
RBC: 4.45 Mil/uL (ref 3.87–5.11)
RDW: 14.4 % (ref 11.5–15.5)
WBC: 7 10*3/uL (ref 4.0–10.5)

## 2018-08-11 LAB — BASIC METABOLIC PANEL
BUN: 18 mg/dL (ref 6–23)
CO2: 28 mEq/L (ref 19–32)
Calcium: 9.4 mg/dL (ref 8.4–10.5)
Chloride: 95 mEq/L — ABNORMAL LOW (ref 96–112)
Creatinine, Ser: 0.79 mg/dL (ref 0.40–1.20)
GFR: 73.57 mL/min (ref >60.00)
Glucose, Bld: 95 mg/dL (ref 70–99)
Potassium: 3.9 mEq/L (ref 3.5–5.1)
Sodium: 135 mEq/L (ref 135–145)

## 2018-08-11 LAB — BRAIN NATRIURETIC PEPTIDE: Pro B Natriuretic peptide (BNP): 26 pg/mL (ref 0.0–100.0)

## 2018-08-11 LAB — TSH: TSH: 1.5 u[IU]/mL (ref 0.35–4.50)

## 2018-08-11 MED ORDER — BEVESPI AEROSPHERE 9-4.8 MCG/ACT IN AERO: 2.0000 | INHALATION_SPRAY | Freq: Two times a day (BID) | RESPIRATORY_TRACT | 0 refills | Status: DC

## 2018-08-11 MED ORDER — OMEPRAZOLE 40 MG PO CPDR: DELAYED_RELEASE_CAPSULE | ORAL | 11 refills | Status: DC

## 2018-08-11 NOTE — Progress Notes (Signed)
LMTCB

## 2018-08-11 NOTE — Patient Instructions (Addendum)
Plan A = Automatic = Bevespi Take 2 puffs first thing in am and then another 2 puffs about 12 hours later.   Work on inhaler technique:  relax and gently blow all the way out then take a nice smooth deep breath back in, triggering the inhaler at same time you start breathing in.  Hold for up to 5 seconds if you can  Rinse and gargle with water when done    Plan B = Backup Only use your albuterol inhaler as a rescue medication to be used if you can't catch your breath by resting or doing a relaxed purse lip breathing pattern.  - The less you use it, the better it will work when you need it. - Ok to use the inhaler up to 2 puffs  every 4 hours if you must but call for appointment if use goes up over your usual need - Don't leave home without it !!  (think of it like the spare tire for your car)   Plan C = Crisis - only use your albuterol nebulizer if you first try Plan B and it fails to help > ok to use the nebulizer up to every 4 hours but if start needing it regularly call for immediate appointment   Plan D = Doctor - call me if B and C not adequate  Plan E = ER - go to ER or call 911 if all else fails     Omeprazole 40 mg Take 30- 60 min before your first and last meals of the day   GERD (REFLUX)  is an extremely common cause of respiratory symptoms just like yours , many times with no obvious heartburn at all.    It can be treated with medication, but also with lifestyle changes including elevation of the head of your bed (ideally with 6 -8inch blocks under the headboard of your bed),  Smoking cessation, avoidance of late meals, excessive alcohol, and avoid fatty foods, chocolate, peppermint, colas, red wine, and acidic juices such as orange juice.  NO MINT OR MENTHOL PRODUCTS SO NO COUGH DROPS  USE SUGARLESS CANDY INSTEAD (Jolley ranchers or Stover's or Life Savers) or even ice chips will also do - the key is to swallow to prevent all throat clearing. NO OIL BASED VITAMINS - use  powdered substitutes.  Avoid fish oil when coughing.      Please remember to go to the lab and x-ray department   for your tests - we will call you with the results when they are available.  Please schedule a follow up office visit in 4 weeks, sooner if needed

## 2018-08-11 NOTE — Progress Notes (Signed)
Subjective:    Patient ID: Samantha Hinton, female    DOB: [birth year] MRN: [id number]    Brief patient profile:  86 yowf quit smoking 01/2014  with GOLD II COPD  baseline doe x best days able to do extensive shopping s 02 better with albuterol when had access to it then admitted [facility]:   Admit Date: 02/17/2012  PRIMARY DISCHARGE DIAGNOSIS:  URI (upper respiratory infection)  COPD exacerbation  SOB (shortness of breath)  Hypoxemia  UTI (lower urinary tract infection)  Diarrhea  Tobacco abuse    04/10/2012 1st pulmonary ov  On ACEI cc doe x 5 steps on neb and albuterol 7 x daily with persistent sense of chest tightness no better since discharge rec Stop lisinopril and start bystolic 5 mg one daily Start dulera 100 Take 2 puffs first thing in am and then another 2 puffs about 12 hours later.  Plan B  Only use your albuterol inhaler, plan C is your nebulizer) as a rescue medication   GERD diet   05/08/2012 f/u ov/Tannie Koskela cc breathing much better on dulera 885 and bystolic no need for saba or neb despite head and chest cold since last ov. rec Start bisoprolol 5 mg daily in place of bystolic - break in half daily if too strong dulera 200 Take 2 puffs first thing in am and then another 2 puffs about 12 hours later.  06/12/2012 f/u ov/Casilda Pickerill re copd Chief Complaint  Patient presents with  . Follow-up    Breathing some worse for the past 2 wks, relates to allergy season. Cough has improve some.    takes zyrtec year round, very nervous using saba 1-2 x daily but typically not at rest or hs.  Minimal impact on activity tolerance though. rec Zyrtec should be used one daily as needed for itching sneezing runny nose Blow out through through the nose Please see patient coordinator before you leave today  to schedule pulmonary rehab > placed on wt list  Prednisone 10 mg take  4 each am x 2 days,   2 each am x 2 days,  1 each am x2days and stop   08/01/2012 f/u ov/Pinchus Weckwerth still smoking Chief Complaint   Patient presents with  . Follow-up    Pt states that her breathing is unchanged since last visit. Cough is some better.   still using hfa albuterol before  And after dulera but  not needing nebulizer alb and not limited from desired activities - still on wt list for rehab. Not convinced dulera really helping her or reducing perceived need for saba and no real change since ran out x > 2 weeks prior to OV   rec tudorza one twice daily on a trial basis Only use your albuterol (ventolin 1st, nebulizer is second)     08/13/2014 f/u ov/Shanaia Sievers re: GOLD II copd/ quit smoking Dec 2015 / not using symb regularly  Chief Complaint  Patient presents with  . Follow-up    Pt here for medication refill. She states her breathing hsa been doing well. She uses albuterol inhaler 1 x daily on average. She rarely uses nebs.   no real change off symbicort up to a month at a time  Only sob overdoes/ sleeps fine  rec Ok to change symbicort to take  as needed Take 2 puffs first thing in am and then another 2 puffs about 12 hours later.  Only use your albuterol as a rescue medication     08/11/2018 acute extended ov/Jazlen Ogarro re:  Re-establish GOLD II copd/ worse sob still on symb 160 2bid  Chief Complaint  Patient presents with  . Pulmonary Consult    Self referral. Pt c/o increased SOB since Spring 2020. She gets winded walking room to room at home.   MMRC3 = can't walk 100 yards even at a slow pace at a flat grade s stopping due to sob  Progressive worse no variability since December 2019 Hs = gurgly on back / overt hb on just prilosec 20 mg not ac No cough  Mild leg swelling   Neb but never before ex    No obvious day to day or daytime variability or assoc excess/ purulent sputum or mucus plugs or hemoptysis or cp or chest tightness, subjective wheeze or overt sinus   symptoms.     Also denies any obvious fluctuation of symptoms with weather or environmental changes or other aggravating or alleviating  factors except as outlined above   No unusual exposure hx or h/o childhood pna/ asthma or knowledge of premature birth.  Current Allergies, Complete Past Medical History, Past Surgical History, Family History, and Social History were reviewed in [employer] record.  ROS  The following are not active complaints unless bolded Hoarseness, sore throat, dysphagia, dental problems, itching, sneezing,  nasal congestion or discharge of excess mucus or purulent secretions, ear ache,   fever, chills, sweats, unintended wt loss or wt gain, classically pleuritic or exertional cp,  orthopnea pnd or arm/hand swelling  or leg swelling, presyncope, palpitations, abdominal pain, anorexia, nausea, vomiting, diarrhea  or change in bowel habits or change in bladder habits, change in stools or change in urine, dysuria, hematuria,  rash, arthralgias, visual complaints, headache, numbness, weakness or ataxia or problems with walking or coordination,  change in mood or  memory.        Current Meds  Medication Sig  . bisoprolol-hydrochlorothiazide (ZIAC) 5-6.25 MG tablet TAKE 1 TABLET BY MOUTH ONCE DAILY  . cetirizine (ZYRTEC) 10 MG tablet Take 10 mg by mouth daily.  Marland Kitchen gabapentin (NEURONTIN) 300 MG capsule 1 pill nightly  . Multiple Vitamins-Minerals (CENTRUM PO) Take 1 tablet by mouth daily.   Marland Kitchen venlafaxine XR (EFFEXOR XR) 75 MG 24 hr capsule Take 1 capsule (75 mg total) by mouth daily with breakfast.  . [DISCONTINUED] budesonide-formoterol (SYMBICORT) 160-4.5 MCG/ACT inhaler Inhale 2 puffs by mouth twice daily  .  ] omeprazole (PRILOSEC) 20 MG capsule Take 1 capsule by mouth once daily               Objective:   Physical Exam   08/11/2018  226  05/08/2012  Wt 195  > 199 06/12/2012 > 201 08/01/2012 > 09/23/2012  204 > 06/19/2013  199 > 07/01/2013  195 > 08/13/2014 183     04/10/12 194 lb 9.6 oz (88.27 kg)  02/20/12 190 lb 12.8 oz (86.546 kg)       Obese wf nad / mild pseudowheeze    HEENT: full top denture/ partial lower  turbinates bilaterally, and oropharynx. Nl external ear canals without cough reflex   NECK :  without JVD/Nodes/TM/ nl carotid upstrokes bilaterally   LUNGS: no acc muscle use,  Nl contour chest which is clear to A and P bilaterally without cough on insp or exp maneuvers   CV:  RRR  no s3 or murmur or increase in P2, and no edema   ABD:  soft and nontender with nl inspiratory excursion in the supine position. No bruits or  organomegaly appreciated, bowel sounds nl  MS:  Nl gait/ ext warm without deformities, calf tenderness, cyanosis or clubbing No obvious joint restrictions   SKIN: warm and dry without lesions    NEURO:  alert, approp, nl sensorium with  no motor or cerebellar deficits apparent.   CXR PA and Lateral:   08/11/2018 :    I personally reviewed images and agree with radiology impression as follows:   No edema or consolidation.   Labs ordered 08/11/2018  Alpha one AT screen    Labs ordered/ reviewed:      Chemistry      Component Value Date/Time   NA 135 08/11/2018 0944   K 3.9 08/11/2018 0944   CL 95 (L) 08/11/2018 0944   CO2 28 08/11/2018 0944   BUN 18 08/11/2018 0944   CREATININE 0.79 08/11/2018 0944      Component Value Date/Time   CALCIUM 9.4 08/11/2018 0944   ALKPHOS 79 10/14/2017 1136   AST 27 10/14/2017 1136   ALT 33 10/14/2017 1136   BILITOT 0.8 10/14/2017 1136        Lab Results  Component Value Date   WBC 7.0 08/11/2018   HGB 13.6 08/11/2018   HCT 41.1 08/11/2018   MCV 92.4 08/11/2018   PLT 199.0 08/11/2018       EOS                                                               0.1                                    08/11/2018   Lab Results  Component Value Date   DDIMER 0.52 (H) 08/11/2018      Lab Results  Component Value Date   TSH 1.50 08/11/2018     Lab Results  Component Value Date   PROBNP 26.0 08/11/2018         Assessment & Plan:

## 2018-08-12 ENCOUNTER — Encounter: Payer: Self-pay | Admitting: Internal Medicine

## 2018-08-12 NOTE — Assessment & Plan Note (Signed)
Body mass index is 36.48 kg/m.  -  trending up Lab Results  Component Value Date   TSH 1.50 08/11/2018     Contributing to gerd risk/ doe/reviewed the need and the process to achieve and maintain neg calorie balance > defer f/u primary care including intermittently monitoring thyroid status      Total time devoted to counseling  > 50 % of initial 60 min office visit:  review case with pt/ directly observed portions of ambulatory 02 saturation study/   device teaching which extended face to face time for this visit  discussion of options/alternatives/ personally creating written customized instructions  in presence of pt  then going over those specific  Instructions directly with the pt including how to use all of the meds but in particular covering each new medication in detail and the difference between the maintenance= "automatic" meds and the prns using an action plan format for the latter (If this problem/symptom => do that organization reading Left to right).  Please see AVS from this visit for a full list of these instructions which I personally wrote for this pt and  are unique to this visit.

## 2018-08-12 NOTE — Assessment & Plan Note (Addendum)
Quit smoking 01/2014   - PFT's 06/12/2012 FEV1  1.41 (60%) ratio 64 and no better p B2  And DLCO 51 corrects to 94 % - 06/12/2012 refer to Rehab - 08/02/2012 started tudorza > improved - 06/19/2013  >  Started symbicort 2 bid   - 08/11/2018  After extensive coaching inhaler device,  effectiveness =    75% try change symb to bevespi 2 bid  And max gerd rx   DDX of  difficult airways management almost all start with A and  include Adherence, Ace Inhibitors, Acid Reflux, Active Sinus Disease, Alpha 1 Antitripsin deficiency, Anxiety masquerading as Airways dz,  ABPA,  Allergy(esp in young), Aspiration (esp in elderly), Adverse effects of meds,  Active smoking or vaping, A bunch of PE's (a small clot burden can't cause this syndrome unless there is already severe underlying pulm or vascular dz with poor reserve) plus two Bs  = Bronchiectasis and Beta blocker use..and one C= CHF   Adherence is always the initial "prime suspect" and is a multilayered concern that requires a "trust but verify" approach in every patient - starting with knowing how to use medications, especially inhalers, correctly, keeping up with refills and understanding the fundamental difference between maintenance and prns vs those medications only taken for a very short course and then stopped and not refilled.  - see hfa training - return with all meds in hand using a trust but verify approach to confirm accurate Medication  Reconciliation The principal here is that until we are certain that the  patients are doing what we've asked, it makes no sense to ask them to do more.    ? Acid (or non-acid) GERD > always difficult to exclude as up to 75% of pts in some series report no assoc GI/ Heartburn symptoms> rec max (24h)  acid suppression and diet restrictions/ reviewed and instructions given in writing.   ? Anxiety/depression/wt gain with deconditioning > usually at the bottom of this list of usual suspects but should be much higher on this  pt's based on H and P and note already on psychotropics and may interfere with adherence and also interpretation of response or lack thereof to symptom management which can be quite subjective.  ? Allergy/asthma > no variability or assoc cough so try off ICS and continue zyrtec prn nasal symptoms   ? Alpha one AT def since worse since stopped smoking > sent screen  ? A bunch of PE's > wt puts her at risk but D dimer nl - while a normal  or high normal value (seen commonly in the elderly or chronically ill)  may miss small peripheral pe, the clot burden with sob is moderately high and the d dimer  has a very high neg pred value if used in this setting.    ? Adverse drug effects > none of the usual suspects listed  ? Anemia/ thyroid dz > ruled out by today's labs  ? Beta blocker effects > very unlikely on bisoprolol> continue    ? chf > excluded today with such a low bnp

## 2018-08-12 NOTE — Assessment & Plan Note (Signed)
Onset early spring 2020 assoc with wt gain - 08/11/2018   Walked RA x one lap =  approx 250 ft - stopped due to  Sob, leg and  back pain sats still 96% @ avg pace   Clearly deconditioned now.

## 2018-08-19 LAB — ALPHA-1 ANTITRYPSIN PHENOTYPE: A-1 Antitrypsin, Ser: 145 mg/dL (ref 83–199)

## 2018-08-19 LAB — D-DIMER, QUANTITATIVE (NOT AT ARMC): D-Dimer, Quant: 0.52 mcg/mL FEU — ABNORMAL HIGH (ref <0.50)

## 2018-09-10 ENCOUNTER — Encounter: Payer: Self-pay | Admitting: Internal Medicine

## 2018-09-10 ENCOUNTER — Ambulatory Visit: Payer: Medicare Other | Admitting: Internal Medicine

## 2018-09-10 ENCOUNTER — Other Ambulatory Visit: Payer: Self-pay

## 2018-09-10 DIAGNOSIS — I10 Essential (primary) hypertension: Secondary | ICD-10-CM

## 2018-09-10 DIAGNOSIS — J449 Chronic obstructive pulmonary disease, unspecified: Secondary | ICD-10-CM | POA: Diagnosis not present

## 2018-09-10 MED ORDER — BISOPROLOL-HYDROCHLOROTHIAZIDE 5-6.25 MG PO TABS: ORAL_TABLET | ORAL | 3 refills | Status: DC

## 2018-09-10 MED ORDER — BEVESPI AEROSPHERE 9-4.8 MCG/ACT IN AERO: 2.0000 | INHALATION_SPRAY | Freq: Two times a day (BID) | RESPIRATORY_TRACT | 0 refills | Status: DC

## 2018-09-10 MED ORDER — BEVESPI AEROSPHERE 9-4.8 MCG/ACT IN AERO: 2.0000 | INHALATION_SPRAY | Freq: Two times a day (BID) | RESPIRATORY_TRACT | 11 refills | Status: DC

## 2018-09-10 MED ORDER — ALBUTEROL SULFATE HFA 108 (90 BASE) MCG/ACT IN AERS: INHALATION_SPRAY | RESPIRATORY_TRACT | 1 refills | Status: DC

## 2018-09-10 MED ORDER — ALBUTEROL SULFATE HFA 108 (90 BASE) MCG/ACT IN AERS: INHALATION_SPRAY | RESPIRATORY_TRACT | Status: DC

## 2018-09-10 NOTE — Patient Instructions (Addendum)
Plan A = Automatic = Bevespi Take 2 puffs first thing in am and then another 2 puffs about 12 hours later.   Increase ziac to one twice daily   Plan B = Backup Only use your albuterol inhaler as a rescue medication to be used if you can't catch your breath by resting or doing a relaxed purse lip breathing pattern.  - The less you use it, the better it will work when you need it. - Ok to use the inhaler up to 2 puffs  every 4 hours if you must but call for appointment if use goes up over your usual need - Don't leave home without it !!  (think of it like the spare tire for your car)   Plan C = Crisis - only use your albuterol nebulizer if you first try Plan B and it fails to help > ok to use the nebulizer up to every 4 hours but if start needing it regularly call for immediate appointment     Please schedule a follow up visit in 3 months but call sooner if needed

## 2018-09-10 NOTE — Progress Notes (Signed)
Subjective:    Patient ID: Samantha Hinton, female    DOB: Mar 08, 1955 MRN: [id number]    Brief patient profile:  12 yowf MM quit smoking 01/2014  with GOLD II COPD  baseline doe x best days able to do extensive shopping s 02 better with albuterol when had access to it then admitted [facility]:   Admit Date: 02/17/2012  PRIMARY DISCHARGE DIAGNOSIS:  URI (upper respiratory infection)  COPD exacerbation  SOB (shortness of breath)  Hypoxemia  UTI (lower urinary tract infection)  Diarrhea  Tobacco abuse    04/10/2012 1st pulmonary ov  On ACEI cc doe x 5 steps on neb and albuterol 7 x daily with persistent sense of chest tightness no better since discharge rec Stop lisinopril and start bystolic 5 mg one daily Start dulera 100 Take 2 puffs first thing in am and then another 2 puffs about 12 hours later.  Plan B  Only use your albuterol inhaler, plan C is your nebulizer) as a rescue medication   GERD diet   05/08/2012 f/u ov/Samantha Hinton cc breathing much better on dulera 237 and bystolic no need for saba or neb despite head and chest cold since last ov. rec Start bisoprolol 5 mg daily in place of bystolic - break in half daily if too strong dulera 200 Take 2 puffs first thing in am and then another 2 puffs about 12 hours later.  06/12/2012 f/u ov/Samantha Hinton re copd Chief Complaint  Patient presents with  . Follow-up    Breathing some worse for the past 2 wks, relates to allergy season. Cough has improve some.    takes zyrtec year round, very nervous using saba 1-2 x daily but typically not at rest or hs.  Minimal impact on activity tolerance though. rec Zyrtec should be used one daily as needed for itching sneezing runny nose Blow out through through the nose Please see patient coordinator before you leave today  to schedule pulmonary rehab > placed on wt list  Prednisone 10 mg take  4 each am x 2 days,   2 each am x 2 days,  1 each am x2days and stop   08/01/2012 f/u ov/Samantha Hinton still smoking Chief  Complaint  Patient presents with  . Follow-up    Pt states that her breathing is unchanged since last visit. Cough is some better.   still using hfa albuterol before  And after dulera but  not needing nebulizer alb and not limited from desired activities - still on wt list for rehab. Not convinced dulera really helping her or reducing perceived need for saba and no real change since ran out x > 2 weeks prior to OV   rec tudorza one twice daily on a trial basis Only use your albuterol (ventolin 1st, nebulizer is second)     08/13/2014 f/u ov/Samantha Hinton re: GOLD II copd/ quit smoking Dec 2015 / not using symb regularly  Chief Complaint  Patient presents with  . Follow-up    Pt here for medication refill. She states her breathing hsa been doing well. She uses albuterol inhaler 1 x daily on average. She rarely uses nebs.   no real change off symbicort up to a month at a time  Only sob overdoes/ sleeps fine  rec Ok to change symbicort to take  as needed Take 2 puffs first thing in am and then another 2 puffs about 12 hours later.  Only use your albuterol as a rescue medication     08/11/2018 acute extended ov/Samantha Hinton  re:  Re-establish GOLD II copd/ worse sob still on symb 160 2bid  Chief Complaint  Patient presents with  . Pulmonary Consult    Self referral. Pt c/o increased SOB since Spring 2020. She gets winded walking room to room at home.   MMRC3 = can't walk 100 yards even at a slow pace at a flat grade s stopping due to sob  Progressive worse no variability since December 2019 Hs = gurgly on back / overt hb on just prilosec 20 mg not ac No cough  Mild leg swelling   Neb but never before ex  rec Plan A = Automatic = Bevespi Take 2 puffs first thing in am and then another 2 puffs about 12 hours later.  Work on inhaler technique:   Plan B = Backup Only use your albuterol inhaler as a rescue medication to be used if you can't catch your breath Plan C = Crisis - only use your albuterol  nebulizer if you first try Plan B and it fails to help > ok to use the nebulizer up to every 4 hours but if start needing it regularly call for immediate appointment Omeprazole 40 mg Take 30- 60 min before your first and last meals of the day  GERD diet    09/10/2018  f/u ov/Samantha Hinton re:  GOLD II maint on bevespi 2 bid improved  Chief Complaint  Patient presents with  . Follow-up    Breathing has improved some. She does not have a rescue inhaler.    Dyspnea:  MMRC2 = can't walk a nl pace on a flat grade s sob but does fine slow and flat  Cough: none  Sleeping: ok flat/ 1-2 pillows  SABA use: none 02: none    No obvious day to day or daytime variability or assoc excess/ purulent sputum or mucus plugs or hemoptysis or cp or chest tightness, subjective wheeze or overt sinus or hb symptoms.   Sleeping as abovbe  without nocturnal  or early am exacerbation  of respiratory  c/o's or need for noct saba. Also denies any obvious fluctuation of symptoms with weather or environmental changes or other aggravating or alleviating factors except as outlined above   No unusual exposure hx or h/o childhood pna/ asthma or knowledge of premature birth.  Current Allergies, Complete Past Medical History, Past Surgical History, Family History, and Social History were reviewed in [employer] record.  ROS  The following are not active complaints unless bolded Hoarseness, sore throat, dysphagia, dental problems, itching, sneezing,  nasal congestion or discharge of excess mucus or purulent secretions, ear ache,   fever, chills, sweats, unintended wt loss or wt gain, classically pleuritic or exertional cp,  orthopnea pnd or arm/hand swelling  or leg swelling, presyncope, palpitations, abdominal pain, anorexia, nausea, vomiting, diarrhea  or change in bowel habits or change in bladder habits, change in stools or change in urine, dysuria, hematuria,  rash, arthralgias, visual complaints, headache,  numbness, weakness or ataxia or problems with walking or coordination,  change in mood or  memory.        Current Meds  Medication Sig  . cetirizine (ZYRTEC) 10 MG tablet Take 10 mg by mouth daily.  Marland Kitchen gabapentin (NEURONTIN) 300 MG capsule 1 pill nightly  . Glycopyrrolate-Formoterol (BEVESPI AEROSPHERE) 9-4.8 MCG/ACT AERO Inhale 2 puffs into the lungs 2 (two) times a day.  . Multiple Vitamins-Minerals (CENTRUM PO) Take 1 tablet by mouth daily.   Marland Kitchen omeprazole (PRILOSEC) 40 MG capsule  Take 30- 60 min before your first and last meals of the day  . venlafaxine XR (EFFEXOR XR) 75 MG 24 hr capsule Take 1 capsule (75 mg total) by mouth daily with breakfast.  . [DISCONTINUED] bisoprolol-hydrochlorothiazide (ZIAC) 5-6.25 MG tablet TAKE 1 TABLET BY MOUTH ONCE DAILY  . [DISCONTINUED] Glycopyrrolate-Formoterol (BEVESPI AEROSPHERE) 9-4.8 MCG/ACT AERO Inhale 2 puffs into the lungs 2 (two) times a day.  . [DISCONTINUED] Glycopyrrolate-Formoterol (BEVESPI AEROSPHERE) 9-4.8 MCG/ACT AERO Inhale 2 puffs into the lungs 2 (two) times a day.                       Objective:   Physical Exam   09/10/2018  227  08/11/2018  226  05/08/2012  Wt 195  > 199 06/12/2012 > 201 08/01/2012 > 09/23/2012  204 > 06/19/2013  199 > 07/01/2013  195 > 08/13/2014 183     04/10/12 194 lb 9.6 oz (88.27 kg)  02/20/12 190 lb 12.8 oz (86.546 kg)    Vital signs reviewed - Note on arrival 02 sats  95% on RA and bp 180/90  And pulse 89   Obese wf nad  HEENT: nl dentition / oropharynx. Nl external ear canals without cough reflex -  Mild bilateral non-specific turbinate edema     NECK :  without JVD/Nodes/TM/ nl carotid upstrokes bilaterally   LUNGS: no acc muscle use,  Min barrel  contour chest wall with bilateral  slightly decreased bs s audible wheeze and  without cough on insp or exp maneuver and min  Hyperresonant  to  percussion bilaterally     CV:  RRR  no s3 or murmur or increase in P2, and trace pitting both lower  extremities.  ABD:  Obese/ soft and nontender with pos end  insp Hoover's  in the supine position. No bruits or organomegaly appreciated, bowel sounds nl  MS:   Nl gait/  ext warm without deformities, calf tenderness, cyanosis or clubbing No obvious joint restrictions   SKIN: warm and dry without lesions    NEURO:  alert, approp, nl sensorium with  no motor or cerebellar deficits apparent.                  Assessment & Plan:

## 2018-09-11 ENCOUNTER — Encounter: Payer: Self-pay | Admitting: Internal Medicine

## 2018-09-11 NOTE — Assessment & Plan Note (Signed)
Changed from acei to bystolic 04/15/47 - increased ziac to bid 09/10/2018    She is clearly not adequately beta blocked based on her pulse rate of 89 this morning after taking her Ziak and also could benefit from additional diuresis I recommended she simply double the dose and follow-up with her primary care provider.   I had an extended discussion with the patient reviewing all relevant studies completed to date and  lasting 15 to 20 minutes of a 25 minute visit    I performed detailed device teaching using a teach back method which extended face to face time for this visit (see above)  Each maintenance medication was reviewed in detail including emphasizing most importantly the difference between maintenance and prns and under what circumstances the prns are to be triggered using an action plan format that is not reflected in the computer generated alphabetically organized AVS which I have not found useful in most complex patients, especially with respiratory illnesses  Please see AVS for specific instructions unique to this visit that I personally wrote and verbalized to the the pt in detail and then reviewed with pt  by my nurse highlighting any  changes in therapy recommended at today's visit to their plan of care.

## 2018-09-11 NOTE — Assessment & Plan Note (Signed)
Quit smoking 01/2014   - PFT's 06/12/2012 FEV1  1.41 (60%) ratio 64 and no better p B2  And DLCO 51 corrects to 94 % - 06/12/2012 refer to Rehab - 08/02/2012 started tudorza > improved - 06/19/2013  >  Started symbicort 2 bid  - 08/11/2018    change symb to bevespi 2 bid And max gerd rx   Alpha one AT screen 08/11/2018  MM  Level  145  - 09/10/2018  After extensive coaching inhaler device,  effectiveness =    95% > continue bevespi  Pt is Group B in terms of symptom/risk and laba/lama therefore appropriate rx at this point >>>  Continue bevespi plus prn saba's-  see avs for instructions unique to this ov

## 2018-12-08 ENCOUNTER — Encounter (HOSPITAL_COMMUNITY): Payer: Self-pay

## 2018-12-08 ENCOUNTER — Other Ambulatory Visit: Payer: Self-pay

## 2018-12-08 ENCOUNTER — Ambulatory Visit (HOSPITAL_COMMUNITY)
Admission: EM | Admit: 2018-12-08 | Discharge: 2018-12-08 | Disposition: A | Discharge status: Home / Self Care | Payer: Medicare Other | Attending: Family Medicine | Admitting: Family Medicine

## 2018-12-08 DIAGNOSIS — R197 Diarrhea, unspecified: Secondary | ICD-10-CM | POA: Diagnosis not present

## 2018-12-08 DIAGNOSIS — R1084 Generalized abdominal pain: Secondary | ICD-10-CM | POA: Insufficient documentation

## 2018-12-08 DIAGNOSIS — Z86010 Personal history of colonic polyps: Secondary | ICD-10-CM | POA: Diagnosis not present

## 2018-12-08 DIAGNOSIS — K219 Gastro-esophageal reflux disease without esophagitis: Secondary | ICD-10-CM | POA: Diagnosis not present

## 2018-12-08 DIAGNOSIS — J439 Emphysema, unspecified: Secondary | ICD-10-CM | POA: Insufficient documentation

## 2018-12-08 DIAGNOSIS — Z801 Family history of malignant neoplasm of trachea, bronchus and lung: Secondary | ICD-10-CM | POA: Insufficient documentation

## 2018-12-08 DIAGNOSIS — M797 Fibromyalgia: Secondary | ICD-10-CM | POA: Diagnosis not present

## 2018-12-08 DIAGNOSIS — Z8673 Personal history of transient ischemic attack (TIA), and cerebral infarction without residual deficits: Secondary | ICD-10-CM | POA: Diagnosis not present

## 2018-12-08 DIAGNOSIS — Z8249 Family history of ischemic heart disease and other diseases of the circulatory system: Secondary | ICD-10-CM | POA: Insufficient documentation

## 2018-12-08 DIAGNOSIS — F4322 Adjustment disorder with anxiety: Secondary | ICD-10-CM | POA: Diagnosis not present

## 2018-12-08 DIAGNOSIS — Z79899 Other long term (current) drug therapy: Secondary | ICD-10-CM | POA: Diagnosis not present

## 2018-12-08 DIAGNOSIS — I10 Essential (primary) hypertension: Secondary | ICD-10-CM | POA: Insufficient documentation

## 2018-12-08 DIAGNOSIS — G8929 Other chronic pain: Secondary | ICD-10-CM | POA: Insufficient documentation

## 2018-12-08 DIAGNOSIS — Z9119 Patient's noncompliance with other medical treatment and regimen: Secondary | ICD-10-CM | POA: Insufficient documentation

## 2018-12-08 DIAGNOSIS — Z90722 Acquired absence of ovaries, bilateral: Secondary | ICD-10-CM | POA: Diagnosis not present

## 2018-12-08 DIAGNOSIS — Z792 Long term (current) use of antibiotics: Secondary | ICD-10-CM | POA: Diagnosis not present

## 2018-12-08 DIAGNOSIS — R110 Nausea: Secondary | ICD-10-CM | POA: Diagnosis not present

## 2018-12-08 DIAGNOSIS — Z20828 Contact with and (suspected) exposure to other viral communicable diseases: Secondary | ICD-10-CM | POA: Insufficient documentation

## 2018-12-08 DIAGNOSIS — Z833 Family history of diabetes mellitus: Secondary | ICD-10-CM | POA: Diagnosis not present

## 2018-12-08 DIAGNOSIS — Z87891 Personal history of nicotine dependence: Secondary | ICD-10-CM | POA: Insufficient documentation

## 2018-12-08 DIAGNOSIS — Z90711 Acquired absence of uterus with remaining cervical stump: Secondary | ICD-10-CM | POA: Insufficient documentation

## 2018-12-08 DIAGNOSIS — E785 Hyperlipidemia, unspecified: Secondary | ICD-10-CM | POA: Diagnosis not present

## 2018-12-08 DIAGNOSIS — Z888 Allergy status to other drugs, medicaments and biological substances status: Secondary | ICD-10-CM | POA: Insufficient documentation

## 2018-12-08 DIAGNOSIS — J449 Chronic obstructive pulmonary disease, unspecified: Secondary | ICD-10-CM

## 2018-12-08 DIAGNOSIS — R1032 Left lower quadrant pain: Secondary | ICD-10-CM | POA: Insufficient documentation

## 2018-12-08 DIAGNOSIS — Z9049 Acquired absence of other specified parts of digestive tract: Secondary | ICD-10-CM | POA: Insufficient documentation

## 2018-12-08 MED ORDER — ALBUTEROL SULFATE (2.5 MG/3ML) 0.083% IN NEBU: 2.5000 mg | INHALATION_SOLUTION | Freq: Four times a day (QID) | RESPIRATORY_TRACT | 12 refills | Status: DC | PRN

## 2018-12-08 MED ORDER — CIPROFLOXACIN HCL 500 MG PO TABS: 500.0000 mg | ORAL_TABLET | Freq: Two times a day (BID) | ORAL | 0 refills | Status: DC

## 2018-12-08 MED ORDER — PROMETHAZINE HCL 25 MG PO TABS: 25.0000 mg | ORAL_TABLET | Freq: Four times a day (QID) | ORAL | 0 refills | Status: DC | PRN

## 2018-12-08 MED ORDER — METRONIDAZOLE 500 MG PO TABS: 500.0000 mg | ORAL_TABLET | Freq: Two times a day (BID) | ORAL | 0 refills | Status: DC

## 2018-12-08 NOTE — ED Provider Notes (Signed)
[place]    CSN: [id number] Arrival date & time: 12/08/18  1106      History   Chief Complaint Chief Complaint  Patient presents with   Abdominal Cramping   Nausea   Diarrhea    HPI Samantha Hinton is a 63 y.o. female.   HPI  Patient has COPD.  Chronic shortness of breath.  Some noncompliance.  She is here today for abdominal pain.  Is been going on for 5 days.  She has left lower quadrant abdominal pain that comes and goes.  Some diarrhea the last couple days.  Some cramping.  She feels like she has abdominal distention.  Decreased appetite.  Low-grade temperature at home.  She states that she has had a colonoscopy at age 63.  She thinks she does have diverticula but is never had diverticulitis.  She has had her gallbladder removed as well as her appendix.  She is having loose bowels and watery bowels, made worse by eating.  She is keeping down plenty of liquids and does not think she is dehydrated.  She has had decreased appetite and some nausea.  Has thrown up once or twice.  None today. She feels like she is taking too much omeprazole.  She takes 40 mg twice a day.  She does have a history of laryngeal reflux, also history of Barrett's esophagus.  She needs to be on a PPI.  She quit taking the PPI because she thought it might be causing her problems.  I told her she needs to go back on this, certainly would contribute to why her stomach hurt now  Past Medical History:  Diagnosis Date   Chronic back pain    buldging disc and tumor.Spondylolisthesis   COPD (chronic obstructive pulmonary disease) (HCC)    Symbicort daily and ProAir as needed   Emphysema    Emphysema of lung (HCC)    Fibromyalgia    Genital warts    GERD (gastroesophageal reflux disease)    takes Omeprazole daily   History of bronchitis 01/2014   History of colon polyps    Hyperlipidemia    has been off of meds d/t getting sick from them.Not been addressed again.    Hypertension     takes Ziac daily   Nocturia    Pneumonia    hx of-8+yrs ago   PONV (postoperative nausea and vomiting)    Shortness of breath dyspnea    thinks d/t pain meds and notices with exertion   Stroke ([place])    16 yrs ago--left eye     Patient Active Problem List   Diagnosis Date Noted   Morbid (severe) obesity due to excess calories ([place]) complicated by hbp [id number]   Reflex sympathetic dystrophy of right lower extremity 09/14/2017   Right ankle pain 08/19/2017   Abnormal LFTs 07/07/2015   Spondylolisthesis of lumbar region 01/04/2015   Colon cancer screening 05/07/2014   Nausea without vomiting 03/20/2014   Family history of lung cancer 12/01/2013   Family history of heart disease 12/01/2013   Hyperlipidemia 12/01/2013   COPD with acute exacerbation ([place]) 08/28/2013   Other and unspecified hyperlipidemia 07/16/2013   Bruising 07/16/2013   Intentional underdosing of medication regimen by patient due to financial hardship 06/16/2013   Adjustment reaction with anxiety 06/16/2013   Adjustment disorder with anxious mood 06/16/2013   Nausea alone 06/16/2013   Left breast lump 06/16/2013   H/O cervical spine surgery 06/16/2013   COPD GOLD II 04/12/2012  Essential hypertension, benign 04/12/2012   DOE (dyspnea on exertion) 02/17/2012   Hypoxemia 02/17/2012    Past Surgical History:  Procedure Laterality Date   ABDOMINAL HYSTERECTOMY  1983   partial    ADENOIDECTOMY     ANTERIOR (CYSTOCELE) AND POSTERIOR REPAIR (RECTOCELE) WITH XENFORM GRAFT AND SACROSPINOUS FIXATION     APPENDECTOMY  1967   BREAST SURGERY Right    diseased milk glands   CHOLECYSTECTOMY  1983   COLONOSCOPY     DIAGNOSTIC LAPAROSCOPY     cyst removed from ovaries    ESOPHAGOGASTRODUODENOSCOPY     NECK SURGERY  [phone]   c spine ant and post hx fusion and removal or harcdware and shavings   rods and screws in lumbar     TONSILLECTOMY      OB History     Gravida  3   Para  2   Term  2   Preterm      AB      Living        SAB      TAB      Ectopic      Multiple      Live Births               Home Medications    Prior to Admission medications   Medication Sig Start Date End Date Taking? Authorizing Provider  albuterol (PROAIR HFA) 108 (90 Base) MCG/ACT inhaler 2 puffs every 4 hours as needed only  if your can't catch your breath 09/10/18  Yes Tanda Rockers, MD  bisoprolol-hydrochlorothiazide [facility]) 5-6.25 MG tablet One twice daily 09/10/18  Yes Tanda Rockers, MD  cetirizine (ZYRTEC) 10 MG tablet Take 10 mg by mouth daily.   Yes [provider]  gabapentin (NEURONTIN) 300 MG capsule 1 pill nightly 05/21/18  Yes Hulan Saas M, DO  Glycopyrrolate-Formoterol (BEVESPI AEROSPHERE) 9-4.8 MCG/ACT AERO Inhale 2 puffs into the lungs 2 (two) times a day. 09/10/18  Yes Tanda Rockers, MD  Multiple Vitamins-Minerals (CENTRUM PO) Take 1 tablet by mouth daily.    Yes [provider]  omeprazole (PRILOSEC) 40 MG capsule Take 30- 60 min before your first and last meals of the day 08/11/18  Yes Tanda Rockers, MD  venlafaxine XR (EFFEXOR XR) 75 MG 24 hr capsule Take 1 capsule (75 mg total) by mouth daily with breakfast. 05/21/18  Yes Lyndal Pulley, DO  albuterol (PROVENTIL) (2.5 MG/3ML) 0.083% nebulizer solution Take 3 mLs (2.5 mg total) by nebulization every 6 (six) hours as needed for wheezing or shortness of breath. 12/08/18   Raylene Everts, MD  ciprofloxacin (CIPRO) 500 MG tablet Take 1 tablet (500 mg total) by mouth 2 (two) times daily. 12/08/18   Raylene Everts, MD  metroNIDAZOLE (FLAGYL) 500 MG tablet Take 1 tablet (500 mg total) by mouth 2 (two) times daily. 12/08/18   Raylene Everts, MD  promethazine (PHENERGAN) 25 MG tablet Take 1 tablet (25 mg total) by mouth every 6 (six) hours as needed for nausea or vomiting. 12/08/18   Raylene Everts, MD    Family History Family History  Problem  Relation Age of Onset   CAD Father        tumor between heart and lung    Hypertension Father    CAD Sister        ? dx    Hypertension Mother    Lung cancer Mother  died 56    Diabetes Maternal Grandmother        late 39s     Social History Social History   Tobacco Use   Smoking status: Former Smoker    Packs/day: 0.25    Years: 44.00    Pack years: 11.00    Types: Cigarettes    Quit date: 02/04/2014    Years since quitting: 4.8   Smokeless tobacco: Never Used   Tobacco comment: quit smoking in Dec 2015  Substance Use Topics   Alcohol use: Yes    Alcohol/week: 4.0 standard drinks    Types: 4 Standard drinks or equivalent per week    Comment: couple times a week   Drug use: No     Allergies   Wellbutrin [bupropion]   Review of Systems Review of Systems  Constitutional: Positive for appetite change. Negative for activity change, chills and fever.  HENT: Negative for congestion, ear pain and sore throat.   Eyes: Negative for pain and visual disturbance.  Respiratory: Positive for shortness of breath. Negative for cough.   Cardiovascular: Negative for chest pain and palpitations.  Gastrointestinal: Positive for abdominal distention, abdominal pain, diarrhea, nausea and vomiting.  Genitourinary: Negative for dysuria and hematuria.  Musculoskeletal: Negative for arthralgias and back pain.  Skin: Negative for color change and rash.  Neurological: Negative for seizures, syncope and headaches.  All other systems reviewed and are negative.    Physical Exam Triage Vital Signs ED Triage Vitals [12/08/18 1221]  Enc Vitals Group     BP (!) 180/90     Pulse Rate 92     Resp 18     Temp 99.9 F (37.7 C)     Temp src      SpO2 96 %     Weight      Height      Head Circumference      Peak Flow      Pain Score 7     Pain Loc      Pain Edu?      Excl. in [place]?    No data found.  Updated Vital Signs BP (!) 180/90    Pulse 92    Temp 99.9 F  (37.7 C)    Resp 18    SpO2 96%      Physical Exam Constitutional:      General: She is not in acute distress.    Appearance: Normal appearance. She is well-developed. She is obese. She is not toxic-appearing.  HENT:     Head: Normocephalic and atraumatic.     Mouth/Throat:     Mouth: Mucous membranes are moist.     Pharynx: No posterior oropharyngeal erythema.  Eyes:     Conjunctiva/sclera: Conjunctivae normal.     Pupils: Pupils are equal, round, and reactive to light.  Neck:     Musculoskeletal: Normal range of motion and neck supple.  Cardiovascular:     Rate and Rhythm: Normal rate and regular rhythm.     Heart sounds: Normal heart sounds.  Pulmonary:     Effort: Pulmonary effort is normal. No respiratory distress.     Breath sounds: Wheezing present.     Comments: Scattered wheezes throughout Abdominal:     General: There is distension.     Palpations: Abdomen is soft.     Tenderness: There is abdominal tenderness. There is no guarding or rebound.     Comments: Large rotund abdomen.  Tense.  Tender in all areas palpated,  especially epigastrium and left lower quadrant.  No guarding or rebound.  Musculoskeletal: Normal range of motion.  Skin:    General: Skin is warm and dry.  Neurological:     Mental Status: She is alert.  Psychiatric:        Mood and Affect: Mood normal.        Behavior: Behavior normal.      UC Treatments / Results  Labs (all labs ordered are listed, but only abnormal results are displayed) Labs Reviewed  NOVEL CORONAVIRUS, NAA (HOSP ORDER, SEND-OUT TO REF LAB; TAT 18-24 HRS)    EKG   Radiology No results found.  Procedures Procedures (including critical care time)  Medications Ordered in UC Medications - No data to display  Initial Impression / Assessment and Plan / UC Course  I have reviewed the triage vital signs and the nursing notes.  Pertinent labs & imaging results that were available during my care of the patient were  reviewed by me and considered in my medical decision making (see chart for details).     I told the patient that it would be reasonable to send her to the ER for additional imaging.  Her exam is difficult because of her abdominal size.  She wishes to try to go home with medicines, and go to the ER only if she worsens.  I will treat her for diverticulitis with Cipro and metronidazole.  I do want her to go back on her PPI.  She does not have any albuterol for her nebulizer and this is given to her to help with the wheezing. Final Clinical Impressions(s) / UC Diagnoses   Final diagnoses:  Generalized abdominal pain  LLQ abdominal pain  Nausea  COPD mixed type [facility])     Discharge Instructions     Go back on your omeprazole Take Phenergan as needed for nausea Take 2 antibiotics, Cipro and metronidazole, 2 times a day You may take an antacid or Pepto-Bismol for stomach discomfort Drink plenty of fluids Bland diet Use the albuterol nebulizer for your wheezing Call your doctor, or return, if not better in 2 to 3 days Go to the ER for worsening abdominal pain or fever   ED Prescriptions    Medication Sig Dispense Auth. Provider   albuterol (PROVENTIL) (2.5 MG/3ML) 0.083% nebulizer solution Take 3 mLs (2.5 mg total) by nebulization every 6 (six) hours as needed for wheezing or shortness of breath. 75 mL Raylene Everts, MD   ciprofloxacin (CIPRO) 500 MG tablet Take 1 tablet (500 mg total) by mouth 2 (two) times daily. 14 tablet Raylene Everts, MD   metroNIDAZOLE (FLAGYL) 500 MG tablet Take 1 tablet (500 mg total) by mouth 2 (two) times daily. 14 tablet Raylene Everts, MD   promethazine (PHENERGAN) 25 MG tablet Take 1 tablet (25 mg total) by mouth every 6 (six) hours as needed for nausea or vomiting. 30 tablet Raylene Everts, MD     PDMP not reviewed this encounter.   Raylene Everts, MD 12/08/18 2113

## 2018-12-08 NOTE — Discharge Instructions (Addendum)
Go back on your omeprazole Take Phenergan as needed for nausea Take 2 antibiotics, Cipro and metronidazole, 2 times a day You may take an antacid or Pepto-Bismol for stomach discomfort Drink plenty of fluids Bland diet Use the albuterol nebulizer for your wheezing Call your doctor, or return, if not better in 2 to 3 days Go to the ER for worsening abdominal pain or fever

## 2018-12-08 NOTE — ED Triage Notes (Signed)
Pt presents with complaints of abdominal pain, nausea, emesis and diarrhea that started on Wednesday. Pt denies any emesis and diarrhea in the last 24 hours. Complaints of mid abdominal pain and nausea at this time.

## 2018-12-10 LAB — NOVEL CORONAVIRUS, NAA (HOSP ORDER, SEND-OUT TO REF LAB; TAT 18-24 HRS): SARS-CoV-2, NAA: NOT DETECTED

## 2018-12-11 ENCOUNTER — Ambulatory Visit: Payer: Medicare Other | Admitting: Internal Medicine

## 2018-12-12 ENCOUNTER — Ambulatory Visit: Payer: Medicare Other | Admitting: Internal Medicine

## 2018-12-26 ENCOUNTER — Telehealth: Payer: Self-pay | Admitting: Family Medicine

## 2018-12-26 MED ORDER — PROMETHAZINE HCL 25 MG PO TABS: 25.0000 mg | ORAL_TABLET | Freq: Four times a day (QID) | ORAL | 0 refills | Status: DC | PRN

## 2018-12-26 NOTE — Telephone Encounter (Signed)
Refilled pt phenergan as requested. Recommended f/u with PCP for further refills.

## 2018-12-29 ENCOUNTER — Ambulatory Visit: Payer: Medicare Other | Admitting: Internal Medicine

## 2018-12-29 ENCOUNTER — Encounter: Payer: Self-pay | Admitting: Internal Medicine

## 2018-12-29 ENCOUNTER — Other Ambulatory Visit: Payer: Self-pay

## 2018-12-29 DIAGNOSIS — R110 Nausea: Secondary | ICD-10-CM | POA: Diagnosis not present

## 2018-12-29 DIAGNOSIS — I10 Essential (primary) hypertension: Secondary | ICD-10-CM | POA: Diagnosis not present

## 2018-12-29 DIAGNOSIS — J449 Chronic obstructive pulmonary disease, unspecified: Secondary | ICD-10-CM | POA: Diagnosis not present

## 2018-12-29 MED ORDER — CLONIDINE HCL 0.1 MG PO TABS: 0.1000 mg | ORAL_TABLET | Freq: Two times a day (BID) | ORAL | 11 refills | Status: DC

## 2018-12-29 NOTE — Progress Notes (Signed)
Subjective:    Patient ID: Samantha Hinton, female    DOB: Jan 27, 1956 MRN: [id number]    Brief patient profile:  22 yowf MM quit smoking 01/2014  with GOLD II COPD  baseline doe x best days able to do extensive shopping s 02 better with albuterol when had access to it then admitted [facility]:   Admit Date: 02/17/2012  PRIMARY DISCHARGE DIAGNOSIS:  URI (upper respiratory infection)  COPD exacerbation  SOB (shortness of breath)  Hypoxemia  UTI (lower urinary tract infection)  Diarrhea  Tobacco abuse    04/10/2012 1st pulmonary ov  On ACEI cc doe x 5 steps on neb and albuterol 7 x daily with persistent sense of chest tightness no better since discharge rec Stop lisinopril and start bystolic 5 mg one daily Start dulera 100 Take 2 puffs first thing in am and then another 2 puffs about 12 hours later.  Plan B  Only use your albuterol inhaler, plan C is your nebulizer) as a rescue medication   GERD diet   05/08/2012 f/u ov/Aurie Harroun cc breathing much better on dulera [id number] and bystolic no need for saba or neb despite head and chest cold since last ov. rec Start bisoprolol 5 mg daily in place of bystolic - break in half daily if too strong dulera 200 Take 2 puffs first thing in am and then another 2 puffs about 12 hours later.  06/12/2012 f/u ov/Oracio Galen re copd Chief Complaint  Patient presents with  . Follow-up    Breathing some worse for the past 2 wks, relates to allergy season. Cough has improve some.    takes zyrtec year round, very nervous using saba 1-2 x daily but typically not at rest or hs.  Minimal impact on activity tolerance though. rec Zyrtec should be used one daily as needed for itching sneezing runny nose Blow out through through the nose Please see patient coordinator before you leave today  to schedule pulmonary rehab > placed on wt list  Prednisone 10 mg take  4 each am x 2 days,   2 each am x 2 days,  1 each am x2days and stop   08/01/2012 f/u ov/Tyjah Hai still smoking Chief  Complaint  Patient presents with  . Follow-up    Pt states that her breathing is unchanged since last visit. Cough is some better.   still using hfa albuterol before  And after dulera but  not needing nebulizer alb and not limited from desired activities - still on wt list for rehab. Not convinced dulera really helping her or reducing perceived need for saba and no real change since ran out x > 2 weeks prior to OV   rec tudorza one twice daily on a trial basis Only use your albuterol (ventolin 1st, nebulizer is second)     08/13/2014 f/u ov/Fairy Ashlock re: GOLD II copd/ quit smoking Dec 2015 / not using symb regularly  Chief Complaint  Patient presents with  . Follow-up    Pt here for medication refill. She states her breathing hsa been doing well. She uses albuterol inhaler 1 x daily on average. She rarely uses nebs.   no real change off symbicort up to a month at a time  Only sob overdoes/ sleeps fine  rec Ok to change symbicort to take  as needed Take 2 puffs first thing in am and then another 2 puffs about 12 hours later.  Only use your albuterol as a rescue medication     08/11/2018 acute extended ov/Shandora Koogler  re:  Re-establish GOLD II copd/ worse sob still on symb 160 2bid  Chief Complaint  Patient presents with  . Pulmonary Consult    Self referral. Pt c/o increased SOB since Spring 2020. She gets winded walking room to room at home.   MMRC3 = can't walk 100 yards even at a slow pace at a flat grade s stopping due to sob  Progressive worse no variability since December 2019 Hs = gurgly on back / overt hb on just prilosec 20 mg not ac No cough  Mild leg swelling   Neb but never before ex  rec Plan A = Automatic = Bevespi Take 2 puffs first thing in am and then another 2 puffs about 12 hours later.  Work on inhaler technique:   Plan B = Backup Only use your albuterol inhaler as a rescue medication to be used if you can't catch your breath Plan C = Crisis - only use your albuterol  nebulizer if you first try Plan B and it fails to help > ok to use the nebulizer up to every 4 hours but if start needing it regularly call for immediate appointment Omeprazole 40 mg Take 30- 60 min before your first and last meals of the day  GERD diet    09/10/2018  f/u ov/Maralee Higuchi re:  GOLD II maint on bevespi 2 bid improved  Chief Complaint  Patient presents with  . Follow-up    Breathing has improved some. She does not have a rescue inhaler.    Dyspnea:  MMRC2 = can't walk a nl pace on a flat grade s sob but does fine slow and flat  Cough: none  Sleeping: ok flat/ 1-2 pillows  SABA use: none 02: none  rec Plan A = Automatic = Bevespi Take 2 puffs first thing in am and then another 2 puffs about 12 hours later.  Increase ziac to one twice daily  Plan B = Backup Only use your albuterol inhaler as a rescue medication Plan C = Crisis - only use your albuterol nebulizer if you first try Plan B and it fails to help > ok to use the nebulizer up to every 4 hours but if start needing it regularly call for immediate appointment Please schedule a follow up visit in 3 months but call sooner if needed     12/29/2018  f/u ov/Cerys Winget re: GOLD II/ bevespi Chief Complaint  Patient presents with  . Follow-up    Breathing is some better since the last visit. She is using her albuterol inhaler about 2 x per day.   Dyspnea:  Limited by back / MMRC2 = can't walk a nl pace on a flat grade s sob but does fine slow and flat   Cough: minimal / none noct  Sleeping: ok flat/ 1-2 pillows  SABA use: as above  02: none  Nausea and abd pain w/u in UC dx diverticulitis some better p cipro/flagyl but still needing phenergan and pain only 50% better > has not had f/u    No obvious day to day or daytime variability or assoc excess/ purulent sputum or mucus plugs or hemoptysis or cp or chest tightness, subjective wheeze or overt sinus or hb symptoms.   Sleeping  without nocturnal  or early am exacerbation  of  respiratory  c/o's or need for noct saba. Also denies any obvious fluctuation of symptoms with weather or environmental changes or other aggravating or alleviating factors except as outlined above   No unusual  exposure hx or h/o childhood pna/ asthma or knowledge of premature birth.  Current Allergies, Complete Past Medical History, Past Surgical History, Family History, and Social History were reviewed in [employer] record.  ROS  The following are not active complaints unless bolded Hoarseness, sore throat, dysphagia, dental problems, itching, sneezing,  nasal congestion or discharge of excess mucus or purulent secretions, ear ache,   fever, chills, sweats, unintended wt loss or wt gain, classically pleuritic or exertional cp,  orthopnea pnd or arm/hand swelling  or leg swelling, presyncope, palpitations, abdominal pain, anorexia, nausea, vomiting, diarrhea  or change in bowel habits or change in bladder habits, change in stools or change in urine, dysuria, hematuria,  rash, arthralgias, visual complaints, headache, numbness, weakness or ataxia or problems with walking or coordination,  change in mood or  memory.        Current Meds  Medication Sig  . albuterol (PROAIR HFA) 108 (90 Base) MCG/ACT inhaler 2 puffs every 4 hours as needed only  if your can't catch your breath  . albuterol (PROVENTIL) (2.5 MG/3ML) 0.083% nebulizer solution Take 3 mLs (2.5 mg total) by nebulization every 6 (six) hours as needed for wheezing or shortness of breath.  . bisoprolol-hydrochlorothiazide (ZIAC) 5-6.25 MG tablet One twice daily  . cetirizine (ZYRTEC) 10 MG tablet Take 10 mg by mouth daily.  Marland Kitchen gabapentin (NEURONTIN) 300 MG capsule 1 pill nightly  . Glycopyrrolate-Formoterol (BEVESPI AEROSPHERE) 9-4.8 MCG/ACT AERO Inhale 2 puffs into the lungs 2 (two) times a day.  . Multiple Vitamins-Minerals (CENTRUM PO) Take 1 tablet by mouth daily.   Marland Kitchen omeprazole (PRILOSEC) 40 MG capsule Take 30- 60  min before your first and last meals of the day  . promethazine (PHENERGAN) 25 MG tablet Take 1 tablet (25 mg total) by mouth every 6 (six) hours as needed for nausea or vomiting.  . venlafaxine XR (EFFEXOR XR) 75 MG 24 hr capsule Take 1 capsule (75 mg total) by mouth daily with breakfast.                           Objective:   Physical Exam   12/29/2018  226  09/10/2018  227  08/11/2018  226  05/08/2012  Wt 195  > 199 06/12/2012 > 201 08/01/2012 > 09/23/2012  204 > 06/19/2013  199 > 07/01/2013  195 > 08/13/2014 183     04/10/12 194 lb 9.6 oz (88.27 kg)  02/20/12 190 lb 12.8 oz (86.546 kg)     amb pleasant wf nad  BP (!) 160/86 (BP Location: Left Arm, Cuff Size: Normal)   Pulse 87   Temp (!) 97.1 F (36.2 C) (Temporal)   Ht 5\' 6"  (1.676 m)   Wt 226 lb (102.5 kg)   SpO2 95% Comment: on RA  BMI 36.48 kg/m     HEENT : pt wearing mask not removed for exam due to covid - 19 concerns.    NECK :  without JVD/Nodes/TM/ nl carotid upstrokes bilaterally   LUNGS: no acc muscle use,  Mild barrel  contour chest wall with bilateral  Distant bs s audible wheeze and  without cough on insp or exp maneuvers  and mild  Hyperresonant  to  percussion bilaterally     CV:  RRR  no s3 or murmur or increase in P2, and no edema   ABD:  Obese soft and no focal tenderness  with pos end  insp Hoover's  in  the supine position. No bruits or organomegaly appreciated, bowel sounds nl  MS:   Nl gait/  ext warm without deformities, calf tenderness, cyanosis or clubbing No obvious joint restrictions   SKIN: warm and dry without lesions    NEURO:  alert, approp, nl sensorium with  no motor or cerebellar deficits apparent.                 Assessment & Plan:

## 2018-12-29 NOTE — Patient Instructions (Addendum)
Start clonidine 0.1 mg twice daily  - can make mouth dry but should help your back pain (use jolly ranchers)   Make appt to see Dr Regis Bill about your abdominal pain    Please schedule a follow up visit in 6  months but call sooner if needed

## 2018-12-30 ENCOUNTER — Encounter: Payer: Self-pay | Admitting: Internal Medicine

## 2018-12-30 NOTE — Assessment & Plan Note (Signed)
Quit smoking 01/2014   - PFT's 06/12/2012 FEV1  1.41 (60%) ratio 64 and no better p B2  And DLCO 51 corrects to 94 % - 06/12/2012 refer to Rehab - 08/02/2012 started tudorza > improved - 06/19/2013  >  Started symbicort 2 bid  - 08/11/2018    change symb to bevespi 2 bid And max gerd rx   Alpha one AT screen 08/11/2018  MM  Level  145  - 09/10/2018  After extensive coaching inhaler device,  effectiveness =    95% > continue bevespi  Pt is Group B in terms of symptom/risk and laba/lama therefore appropriate rx at this point >>>  bevespi approp - if starts having exac change to [place] next step  Advised:  formulary restrictions will be an ongoing challenge for the forseable future and I would be happy to pick an alternative if the pt will first  provide me a list of them -  pt  will need to return here for training for any new device that is required eg dpi vs hfa vs respimat.    In the meantime we can always provide samples so that the patient never runs out of any needed respiratory medications.

## 2018-12-30 NOTE — Assessment & Plan Note (Signed)
Changed from acei to bystolic [id number] - increased ziac to bid 09/10/2018  - 12/29/2018 added clonidine 0.1 bid for chronic pain/hbp   Advised keep candy handy to prevent mouth dryness, esp in 1st two weeks of rx, and f/u with pcp

## 2018-12-30 NOTE — Assessment & Plan Note (Addendum)
Assoc with abd pain partially responsive to abx/ phenergan  No acute findings on today's eval > referred back to PCP for gi referral prn (has seen [facility] in past but no longer with the GI group here) - note seen for the same problem in 2016    I had an extended discussion with the patient reviewing all relevant studies completed to date and  lasting 15 to 20 minutes of a 25 minute visit    Each maintenance medication was reviewed in detail including most importantly the difference between maintenance and prns and under what circumstances the prns are to be triggered using an action plan format that is not reflected in the computer generated alphabetically organized AVS.     Please see AVS for specific instructions unique to this visit that I personally wrote and verbalized to the the pt in detail and then reviewed with pt  by my nurse highlighting any  changes in therapy recommended at today's visit to their plan of care.

## 2019-02-24 ENCOUNTER — Telehealth (INDEPENDENT_AMBULATORY_CARE_PROVIDER_SITE_OTHER): Payer: Medicare Other | Admitting: Internal Medicine

## 2019-02-24 ENCOUNTER — Encounter: Payer: Self-pay | Admitting: Internal Medicine

## 2019-02-24 ENCOUNTER — Other Ambulatory Visit: Payer: Self-pay

## 2019-02-24 DIAGNOSIS — R109 Unspecified abdominal pain: Secondary | ICD-10-CM

## 2019-02-24 DIAGNOSIS — M549 Dorsalgia, unspecified: Secondary | ICD-10-CM | POA: Diagnosis not present

## 2019-02-24 DIAGNOSIS — R112 Nausea with vomiting, unspecified: Secondary | ICD-10-CM | POA: Diagnosis not present

## 2019-02-24 DIAGNOSIS — M4316 Spondylolisthesis, lumbar region: Secondary | ICD-10-CM | POA: Diagnosis not present

## 2019-02-24 NOTE — Progress Notes (Signed)
Virtual Visit via Video Note  I connected with@ on 02/24/19 at  3:00 PM EST by a video enabled telemedicine application and verified that I am speaking with the correct person using two identifiers. Location patient: home Location provider:work or home office Persons participating in the virtual visit: patient, provider  [place] national recommendations  regarding COVID 19 pandemic   video visit is advised over in office visit for this patient.  Patient aware  of the limitations of evaluation and management by telemedicine and  availability of in person appointments. and agreed to proceed.   HPI: Samantha Hinton presents for video visit she has been having a few months of abdominal pain and nausea with occasional occasional vomiting. She was seen in the emergency room 12/08/2018 for abdominal pain and treated empirically for diverticulitis with Cipro and Flagyl but no scan was done.  Her last CTA was 2018 She then saw Dr. Melvyn Novas in the fall for her respiratory conditions and was noted to have blood pressure 160/80.  Her Ziac had been increased and he added clonidine 0.1 twice daily that is helped control her blood pressure He also increase her protonics Prilosec to twice a day but did not seem to really help her stomach and was told to come and see her PCP.  She also notes serious lower extremity pain that is nerve pain seen by Dr. Tamala Julian that is been quite difficult for her hard to sit long periods of time or do anything she has not been on any NSAIDs recently but gabapentin and had used ibuprofen in the past but not recently. She had a history of back surgery for I believe spinal stenosis like 4 years ago with Dr. Hal Neer.  And her pain had been relieved and over the last year or so pain is increased again almost as preoperatively.  This is been very tough on her.  She reports extreme nausea using Phenergan as needed does not resolve the issue.  She gets abdominal pain that she states right  below her rib cage and then will radiate to both lower sides.  It is worse after eating but is there on an empty stomach.  Describes some of it is burning denies any change in bowel habits and denies UTI symptoms.  ROS: See pertinent positives and negatives per HPI.  No bleeding states she has had her appendix and her gallbladder out.  States that her breathing is a lot better on new her inhalers. No tobacco for 5 years has cut back on caffeine because causes stomach issues occasional alcohol every other every 3 days at the most.  States that she has been having temperatures of about 99 or so for over a month but no chills. Past Medical History:  Diagnosis Date  . Chronic back pain    buldging disc and tumor.Spondylolisthesis  . COPD (chronic obstructive pulmonary disease) (HCC)    Symbicort daily and ProAir as needed  . Emphysema   . Emphysema of lung ([place])   . Fibromyalgia   . Genital warts   . GERD (gastroesophageal reflux disease)    takes Omeprazole daily  . History of bronchitis 01/2014  . History of colon polyps   . Hyperlipidemia    has been off of meds d/t getting sick from them.Not been addressed again.   Marland Kitchen Hypertension    takes Ziac daily  . Nocturia   . Pneumonia    hx of-8+yrs ago  . PONV (postoperative nausea and vomiting)   .  Shortness of breath dyspnea    thinks d/t pain meds and notices with exertion  . Stroke ([place])    16 yrs ago--left eye     Past Surgical History:  Procedure Laterality Date  . ABDOMINAL HYSTERECTOMY  1983   partial   . ADENOIDECTOMY    . ANTERIOR (CYSTOCELE) AND POSTERIOR REPAIR (RECTOCELE) WITH XENFORM GRAFT AND SACROSPINOUS FIXATION    . APPENDECTOMY  1967  . BREAST SURGERY Right    diseased milk glands  . CHOLECYSTECTOMY  1983  . COLONOSCOPY    . DIAGNOSTIC LAPAROSCOPY     cyst removed from ovaries   . ESOPHAGOGASTRODUODENOSCOPY    . NECK SURGERY  [phone]   c spine ant and post hx fusion and removal or harcdware and  shavings  . rods and screws in lumbar    . TONSILLECTOMY      Family History  Problem Relation Age of Onset  . CAD Father        tumor between heart and lung   . Hypertension Father   . CAD Sister        ? dx   . Hypertension Mother   . Lung cancer Mother        died 14   . Diabetes Maternal Grandmother        late 55s     Social History   Tobacco Use  . Smoking status: Former Smoker    Packs/day: 0.25    Years: 44.00    Pack years: 11.00    Types: Cigarettes    Quit date: 02/04/2014    Years since quitting: 5.0  . Smokeless tobacco: Never Used  . Tobacco comment: quit smoking in Dec 2015  Substance Use Topics  . Alcohol use: Yes    Alcohol/week: 4.0 standard drinks    Types: 4 Standard drinks or equivalent per week    Comment: couple times a week  . Drug use: No      Current Outpatient Medications:  .  albuterol (PROAIR HFA) 108 (90 Base) MCG/ACT inhaler, 2 puffs every 4 hours as needed only  if your can't catch your breath, Disp: 18 g, Rfl: 1 .  albuterol (PROVENTIL) (2.5 MG/3ML) 0.083% nebulizer solution, Take 3 mLs (2.5 mg total) by nebulization every 6 (six) hours as needed for wheezing or shortness of breath., Disp: 75 mL, Rfl: 12 .  bisoprolol-hydrochlorothiazide (ZIAC) 5-6.25 MG tablet, One twice daily, Disp: 180 tablet, Rfl: 3 .  cetirizine (ZYRTEC) 10 MG tablet, Take 10 mg by mouth daily., Disp: , Rfl:  .  cloNIDine (CATAPRES) 0.1 MG tablet, Take 1 tablet (0.1 mg total) by mouth 2 (two) times daily., Disp: 60 tablet, Rfl: 11 .  gabapentin (NEURONTIN) 300 MG capsule, 1 pill nightly, Disp: 90 capsule, Rfl: 3 .  Glycopyrrolate-Formoterol (BEVESPI AEROSPHERE) 9-4.8 MCG/ACT AERO, Inhale 2 puffs into the lungs 2 (two) times a day., Disp: 4 g, Rfl: 0 .  Multiple Vitamins-Minerals (CENTRUM PO), Take 1 tablet by mouth daily. , Disp: , Rfl:  .  omeprazole (PRILOSEC) 40 MG capsule, Take 30- 60 min before your first and last meals of the day, Disp: 60 capsule, Rfl:  11 .  promethazine (PHENERGAN) 25 MG tablet, Take 1 tablet (25 mg total) by mouth every 6 (six) hours as needed for nausea or vomiting., Disp: 30 tablet, Rfl: 0 .  venlafaxine XR (EFFEXOR XR) 75 MG 24 hr capsule, Take 1 capsule (75 mg total) by mouth daily with breakfast., Disp: 90 capsule,  Rfl: 3  EXAM: BP Readings from Last 3 Encounters:  12/29/18 (!) 160/86  12/08/18 (!) 180/90  09/10/18 (!) 180/90    VITALS per patient if applicable: Reports blood pressure 1 40-1 50 range or less  GENERAL: alert, oriented, appears well and in no acute distress she looks tired but in no acute distress normal speech  HEENT: atraumatic, conjunttiva clear, no obvious abnormalities on inspection of external nose and ears  NECK: normal movements of the head and neck  LUNGS: on inspection no signs of respiratory distress, breathing rate appears normal, no obvious gross SOB, gasping or wheezing  CV: no obvious cyanosis  PSYCH/NEURO: pleasant and cooperative, no obvious depression or anxiety, speech and thought processing grossly intact Lab Results  Component Value Date   WBC 7.0 08/11/2018   HGB 13.6 08/11/2018   HCT 41.1 08/11/2018   PLT 199.0 08/11/2018   GLUCOSE 95 08/11/2018   CHOL 246 (H) 08/12/2017   TRIG 221.0 (H) 08/12/2017   HDL 57.20 08/12/2017   LDLDIRECT 152.0 08/12/2017   LDLCALC 116 (H) 05/24/2014   ALT 33 10/14/2017   AST 27 10/14/2017   NA 135 08/11/2018   K 3.9 08/11/2018   CL 95 (L) 08/11/2018   CREATININE 0.79 08/11/2018   BUN 18 08/11/2018   CO2 28 08/11/2018   TSH 1.50 08/11/2018    ASSESSMENT AND PLAN:  Discussed the following assessment and plan:    ICD-10-CM   1. Abdominal pain, unspecified abdominal location  E28.0 Basic metabolic panel    CBC with Differential    Hepatic function panel    TSH    Sed Rate (ESR)    C-reactive protein    Urinalysis, Routine w reflex microscopic    Celiac Disease Comprehensive Panel with Reflexes  2. Non-intractable  vomiting with nausea, unspecified vomiting type  K34.9 Basic metabolic panel    CBC with Differential    Hepatic function panel    TSH    Sed Rate (ESR)    C-reactive protein    Urinalysis, Routine w reflex microscopic    Celiac Disease Comprehensive Panel with Reflexes    CT Abdomen Pelvis W Contrast  3. Back pain with radiation  Z79.1 Basic metabolic panel    CBC with Differential    Hepatic function panel    TSH    Sed Rate (ESR)    C-reactive protein    Urinalysis, Routine w reflex microscopic    Celiac Disease Comprehensive Panel with Reflexes  4. Spondylolisthesis of lumbar region  T05.69 Basic metabolic panel    CBC with Differential    Hepatic function panel    TSH    Sed Rate (ESR)    C-reactive protein    Urinalysis, Routine w reflex microscopic    Celiac Disease Comprehensive Panel with Reflexes  5. Back pain with history of spinal surgery  M54.9    Ongoing symptoms without obvious etiology plan laboratory studies at the interim lab order abdominal CT pelvis with contrast Unless results direct elsewhere will probably end up with a GI consult.  As next step question endoscopy question ulcerative disease  Back condition is problematic and after initial improvement from her surgery is now worse again.  We will get Dr. Thompson Caul input whether should have repeat imaging and or back surgery consult as this seems to be truly life-changing with her pain adding onto these other issues. Fortunately her pulmonary status appears to be stable. Counseled.   Expectant management and discussion of plan and  treatment with opportunity to ask questions and all were answered. The patient agreed with the plan and demonstrated an understanding of the instructions.   Advised to call back or seek aevaluation if worsening  In interim  or having  further concerns . Return for depending on labs x ray and  results and plan  .   Shanon Ace, MD  Outside external source  DATA REVIEWED:   Dr Melvyn Novas   Visit  Nov  Ed evaluation in October 20   And dr Tamala Julian     Total time on date  of service including record review ordering and plan of care:   40 minutes

## 2019-03-02 ENCOUNTER — Other Ambulatory Visit (INDEPENDENT_AMBULATORY_CARE_PROVIDER_SITE_OTHER): Payer: Medicare Other

## 2019-03-02 DIAGNOSIS — R112 Nausea with vomiting, unspecified: Secondary | ICD-10-CM

## 2019-03-02 DIAGNOSIS — M4316 Spondylolisthesis, lumbar region: Secondary | ICD-10-CM

## 2019-03-02 DIAGNOSIS — M549 Dorsalgia, unspecified: Secondary | ICD-10-CM | POA: Diagnosis not present

## 2019-03-02 DIAGNOSIS — R109 Unspecified abdominal pain: Secondary | ICD-10-CM

## 2019-03-02 LAB — CBC WITH DIFFERENTIAL/PLATELET
Basophils Absolute: 0.1 10*3/uL (ref 0.0–0.1)
Basophils Relative: 0.8 % (ref 0.0–3.0)
Eosinophils Absolute: 0.2 10*3/uL (ref 0.0–0.7)
Eosinophils Relative: 2.2 % (ref 0.0–5.0)
HCT: 42.4 % (ref 36.0–46.0)
Hemoglobin: 14.1 g/dL (ref 12.0–15.0)
Lymphocytes Relative: 25.6 % (ref 12.0–46.0)
Lymphs Abs: 1.8 10*3/uL (ref 0.7–4.0)
MCHC: 33.3 g/dL (ref 30.0–36.0)
MCV: 91.5 fl (ref 78.0–100.0)
Monocytes Absolute: 0.4 10*3/uL (ref 0.1–1.0)
Monocytes Relative: 6.3 % (ref 3.0–12.0)
Neutro Abs: 4.5 10*3/uL (ref 1.4–7.7)
Neutrophils Relative %: 65.1 % (ref 43.0–77.0)
Platelets: 201 10*3/uL (ref 150.0–400.0)
RBC: 4.63 Mil/uL (ref 3.87–5.11)
RDW: 14.8 % (ref 11.5–15.5)
WBC: 7 10*3/uL (ref 4.0–10.5)

## 2019-03-02 LAB — URINALYSIS, ROUTINE W REFLEX MICROSCOPIC
Bilirubin Urine: NEGATIVE
Hgb urine dipstick: NEGATIVE
Ketones, ur: NEGATIVE
Leukocytes,Ua: NEGATIVE
Nitrite: NEGATIVE
RBC / HPF: NONE SEEN (ref 0–?)
Specific Gravity, Urine: 1.02 (ref 1.000–1.030)
Total Protein, Urine: NEGATIVE
Urine Glucose: NEGATIVE
Urobilinogen, UA: 0.2 (ref 0.0–1.0)
WBC, UA: NONE SEEN (ref 0–?)
pH: 7 (ref 5.0–8.0)

## 2019-03-02 LAB — BASIC METABOLIC PANEL
BUN: 19 mg/dL (ref 6–23)
CO2: 29 mEq/L (ref 19–32)
Calcium: 9.8 mg/dL (ref 8.4–10.5)
Chloride: 96 mEq/L (ref 96–112)
Creatinine, Ser: 0.82 mg/dL (ref 0.40–1.20)
GFR: 70.35 mL/min (ref >60.00)
Glucose, Bld: 141 mg/dL — ABNORMAL HIGH (ref 70–99)
Potassium: 3.8 mEq/L (ref 3.5–5.1)
Sodium: 137 mEq/L (ref 135–145)

## 2019-03-02 LAB — HEPATIC FUNCTION PANEL
ALT: 114 U/L — ABNORMAL HIGH (ref 0–35)
AST: 139 U/L — ABNORMAL HIGH (ref 0–37)
Albumin: 4 g/dL (ref 3.5–5.2)
Alkaline Phosphatase: 86 U/L (ref 39–117)
Bilirubin, Direct: 0.1 mg/dL (ref 0.0–0.3)
Total Bilirubin: 0.7 mg/dL (ref 0.2–1.2)
Total Protein: 7.6 g/dL (ref 6.0–8.3)

## 2019-03-02 LAB — SEDIMENTATION RATE: Sed Rate: 62 mm/hr — ABNORMAL HIGH (ref 0–30)

## 2019-03-02 LAB — C-REACTIVE PROTEIN: CRP: 2.6 mg/dL (ref 0.5–20.0)

## 2019-03-02 LAB — TSH: TSH: 1.38 u[IU]/mL (ref 0.35–4.50)

## 2019-03-03 LAB — CELIAC DISEASE COMPREHENSIVE PANEL WITH REFLEXES
(tTG) Ab, IgA: 1 U/mL
Immunoglobulin A: 303 mg/dL (ref 70–320)

## 2019-03-03 NOTE — Progress Notes (Signed)
So labs normal except  your liver tests are abnormal again  not sure if from med or other process so get the  ct scan   avoid taking NSAID and limit tylenol  ( and no alcohol of course ) and we  then will plan  Fu  we may need to do more blood work   .  dr Tamala Julian advised we can do more evaluation on back   such as mri and ncs  but often need to get a back specialist involved since sometimes mri  is best to be ordered by specials   if you have had surgery.

## 2019-03-06 ENCOUNTER — Ambulatory Visit
Admission: RE | Admit: 2019-03-06 | Discharge: 2019-03-06 | Disposition: A | Discharge status: Home / Self Care | Payer: Medicare Other | Source: Ambulatory Visit | Attending: Internal Medicine | Admitting: Internal Medicine

## 2019-03-06 ENCOUNTER — Other Ambulatory Visit: Payer: Self-pay

## 2019-03-06 DIAGNOSIS — R1110 Vomiting, unspecified: Secondary | ICD-10-CM | POA: Diagnosis not present

## 2019-03-06 DIAGNOSIS — R112 Nausea with vomiting, unspecified: Secondary | ICD-10-CM

## 2019-03-06 DIAGNOSIS — R109 Unspecified abdominal pain: Secondary | ICD-10-CM | POA: Diagnosis not present

## 2019-03-06 MED ORDER — IOPAMIDOL (ISOVUE-300) INJECTION 61%
125.0000 mL | Freq: Once | INTRAVENOUS | Status: AC | PRN
  Administered 2019-03-06: 11:00:00 125 mL via INTRAVENOUS

## 2019-03-09 NOTE — Progress Notes (Signed)
So no cause of  sx per se  but there ia an ovarian cyst that the  radiologist thinks is not to oserious but should have a follow up .  Your liver has some  fatty filtration.  But that shouldn't  cause pain .  I want you to see  Gi specialist   and make sure we have a fu of the liver tests.  With them or  me . At this time I can refer also to  a spine  specilist or NS for decision about imaging  of your back .   do you want [place] to do referral now or  wait until the Gi evalaution is   underway. ?  Samantha Hinton please do referral  to GI  dx  abnormal lfts and  vomiting abd pain episodes   also order  lfts and  acute hepatitis panel.  Dx abnormal lfts doedsn have to be fasting

## 2019-03-11 ENCOUNTER — Other Ambulatory Visit: Payer: Self-pay

## 2019-03-11 DIAGNOSIS — R7989 Other specified abnormal findings of blood chemistry: Secondary | ICD-10-CM

## 2019-03-11 DIAGNOSIS — R109 Unspecified abdominal pain: Secondary | ICD-10-CM

## 2019-03-11 DIAGNOSIS — R945 Abnormal results of liver function studies: Secondary | ICD-10-CM

## 2019-03-11 DIAGNOSIS — R112 Nausea with vomiting, unspecified: Secondary | ICD-10-CM

## 2019-03-31 ENCOUNTER — Ambulatory Visit: Payer: Medicare Other | Admitting: Gastroenterology

## 2019-03-31 ENCOUNTER — Encounter: Payer: Self-pay | Admitting: Gastroenterology

## 2019-03-31 VITALS — BP 158/86 | HR 80 | Temp 97.4°F | Ht 66.0 in | Wt 231.0 lb

## 2019-03-31 DIAGNOSIS — K760 Fatty (change of) liver, not elsewhere classified: Secondary | ICD-10-CM | POA: Diagnosis not present

## 2019-03-31 DIAGNOSIS — Z01818 Encounter for other preprocedural examination: Secondary | ICD-10-CM

## 2019-03-31 DIAGNOSIS — Z1211 Encounter for screening for malignant neoplasm of colon: Secondary | ICD-10-CM

## 2019-03-31 DIAGNOSIS — R7401 Elevation of levels of liver transaminase levels: Secondary | ICD-10-CM

## 2019-03-31 DIAGNOSIS — Z8719 Personal history of other diseases of the digestive system: Secondary | ICD-10-CM | POA: Diagnosis not present

## 2019-03-31 MED ORDER — NA SULFATE-K SULFATE-MG SULF 17.5-3.13-1.6 GM/177ML PO SOLN: 1.0000 | ORAL | 0 refills | Status: DC

## 2019-03-31 MED ORDER — OMEPRAZOLE 40 MG PO CPDR: 40.0000 mg | DELAYED_RELEASE_CAPSULE | Freq: Two times a day (BID) | ORAL | 3 refills | Status: DC

## 2019-03-31 MED ORDER — ONDANSETRON 4 MG PO TBDP: 4.0000 mg | ORAL_TABLET | Freq: Three times a day (TID) | ORAL | 0 refills | Status: DC | PRN

## 2019-03-31 NOTE — Progress Notes (Signed)
Referring Provider: Burnis Medin, MD Primary Care Physician:  Burnis Medin, MD  Reason for Consultation: Abdominal pain, vomiting, abnormal liver enzymes   IMPRESSION:  Near constant nausea and intermittent abdominal pain x 5-6 months    - negative H. pylori breath test 12/10/2008    - previously evaluated on EGD with Dr. Deatra Ina 05/12/14    - no source identified on CT scan 03/06/19 Elevated transaminases with an AST of 139, ALT 114    - intermittently elevated for years    - normal TTGA and IgA 03/02/19 Fatty liver on CT Unintentional weight gain of 50 pounds since Covid Elevated ESR 62, normal CRP 2.6 Left-sided diverticulosis without history of diverticulitis Prior cholecystectomy 1983 Suspected endoscopic appearance of Barrett's     - esophageal biopsies negative 2016 History of esophagitis on EGD with Dr. Deatra Ina 05/12/2014 Recently treating abdominal pain with ibuprofen Normal screening colonoscopy in [place] at age 26 No known family history of colon cancer or polyps  Abdominal pain and nausea: Differential is broad. EGD recommended to exclude organic disease. Obtain amylase and lipase today. Trial of PPI therapy given the potential for esophagitis, reflux especially given her recent 50 pound weight gain, and PUD given her recent NSAID use.  Zofran offered today in place of Phenergan as it is less sedating. I made it clear that our goal is not long term use. Consider gastric emptying scan if EGD is non-diagnostic.   Chronically elevated liver enzymes: Unlikely to be associated with her recent symptoms given the chronicity. Will proceed with serologic evaluation to screen for hepatocellular liver disease. However, alcohol +/- fatty liver is likely, particularly as she admits to more alcohol use during Covid. History, physical, labs and imaging including normal platelets, show no evidence for advanced fibrosis or cirrhosis.   Need for colon cancer screening: Colonoscopy is  due. Last colonoscopy was 13 years ago. Patient reports a normal exam at that time. There is no family history of colon cancer or polyps.     PLAN: Omeprazole 40 mg BID Zofran '4mg'$  q4 hours PRN (#100, no refills) EGD Colonoscopy Work to maintain a healthy weight Reduce daily alcohol intake Labs to evaluate abnormal liver enzymes: Hepatitis C antibody, hepatitis B surface antigen, hepatitis B core antibody, fasting ferritin, fasting insulin, fasting glucose, iron, ANA, AMA, anti-smooth muscle antibody, IgG, IgM Lipase, amylase  I spent 62 minutes of time, including in depth chart review, independent review of results as outlined above, face-to-face time with the patient, coordinating care, ordering studies and medications as appropriate, and documentation.    HPI: Samantha Hinton is a 64 y.o. female referred by Dr. Regis Bill for abdominal pain, vomiting, and abnormal liver enzymes.  The history is obtained through the patient and review of her electronic health record.  She has COPD, hypertension, hypercholesterolemia, back pain. She had a cholecystectomy in 1981.   Seen by Dr. Deatra Ina in 2016 for several years of nausea exacerbated by eating with associated abdominal distention and bloating.  He suspected gastroparesis, peptic ulcer disease or nonulcer dyspepsia.  Upper endoscopy 05/12/2014 showed suspected 3 cm segment of Barrett's esophagus.  The exam was otherwise normal.  Pathology results showed chronic inflammation in the distal esophagus.  There was no intestinal metaplasia, dysplasia or malignancy.  She was treated with omeprazole 20 mg daily.  A gastric emptying scan was recommended but not performed.  Although she identifies having intermittent GI symptoms over the years, over the last 5-6 months she has had  progressive abdominal pain, nausea, and rare vomiting. Nausea is constant. Eating provides relief. Was using Phenergan but she ran out of the refill because she received it from the  [facility]. She thought the omeprazole was causing her nausea and stopped using it.   Intermittent abdominal pain is located below her right rib and radiates across the mid abdomen.  It is worse after eating but can be present on an empty stomach. No change with defecation. Lying down and fetal position of the left side helps with the nausea.   Seen in the emergency room 12/08/2018 for 5 days of abdominal pain and treated empirically for diverticulitis with Cipro and Flagyl.  No CT scan was done at that time.  Dr. Melvyn Novas increased her Prilosec to twice daily last fall. Acid control improved but not the nausea.   Previously treating the abdominal pain with ibuprofen with 400 mg BID. She stopped using after she spoke with Dr. Regis Bill.   Appetite is good. Unintentional weight gain of 50 pounds during Covid.  She wonders if this is due to Covid-stress, worry, and isolation. She notes being depressed.   No prior blood donation.  No prior blood transfusion.  No history of jaundice or scleral icterus.  No history of use or experimentation with IV or intranasal street drugs.    Drinks vodka and wine a couple times a week. Alcohol intake has increase during Covid.   Labs 03/02/2019 showed an AST of 139 and an ALT of 114.  Total bilirubin 0.7, alk phos 86, albumin 4.0, total protein 7.6.  CRP 2.6.  ESR 62.  Normal CBC with a hemoglobin of 14.1, platelets 201.  TSH normal.  TTGA and IgA normal.  Review of older labs shows that the transaminases have been intermittently elevated: 07/09/2013: AST 23, ALT 31 05/24/2014: AST 22, ALT 22 06/29/2015: AST 52, ALT 78 03/13/2016: AST 35, ALT 60 03/30/2016: AST 51, ALT 69 07/25/2016: AST 18, ALT 18 08/12/2017: AST 23, ALT 40 10/14/2017: AST 27, ALT 33 03/02/2019: AST 139, ALT 114 Total bilirubin, albumin, and alkaline phosphatase have been consistently normal.  CT of the abdomen and pelvis with contrast 03/06/2019 showed no acute findings to explain abdominal pain,  hepatic steatosis, left-sided diverticulosis, prior cholecystectomy, prior appendectomy, prior hysterectomy, and a 2.2 cm left ovarian cyst.  I personally reviewed her CT images and compared them to the CT of the abdomen and pelvis with contrast from January 2018.  Diffuse fatty infiltration of the liver is seen in 2018 as well.  Distant testing shows a negative H. pylori breath test 12/10/2008  She reports having a colonoscopy at age 63. It was performed in [place].   No known family history of colon cancer or polyps. No family history of uterine/endometrial cancer, pancreatic cancer or gastric/stomach cancer. No family history of liver disease. No family history of autoimmune disease.    Past Medical History:  Diagnosis Date  . Chronic back pain    buldging disc and tumor.Spondylolisthesis  . COPD (chronic obstructive pulmonary disease) (HCC)    Symbicort daily and ProAir as needed  . Emphysema   . Emphysema of lung ([place])   . Fibromyalgia   . Genital warts   . GERD (gastroesophageal reflux disease)    takes Omeprazole daily  . History of bronchitis 01/2014  . History of colon polyps   . Hyperlipidemia    has been off of meds d/t getting sick from them.Not been addressed again.   Marland Kitchen Hypertension  takes Ziac daily  . Nocturia   . Pneumonia    hx of-8+yrs ago  . PONV (postoperative nausea and vomiting)   . Shortness of breath dyspnea    thinks d/t pain meds and notices with exertion  . Stroke ([place])    16 yrs ago--left eye     Past Surgical History:  Procedure Laterality Date  . ABDOMINAL HYSTERECTOMY  1983   partial   . ADENOIDECTOMY    . ANTERIOR (CYSTOCELE) AND POSTERIOR REPAIR (RECTOCELE) WITH XENFORM GRAFT AND SACROSPINOUS FIXATION    . APPENDECTOMY  1967  . BREAST SURGERY Right    diseased milk glands  . CHOLECYSTECTOMY  1983  . COLONOSCOPY    . DIAGNOSTIC LAPAROSCOPY     cyst removed from ovaries   . ESOPHAGOGASTRODUODENOSCOPY    . NECK SURGERY   [id number]   c spine ant and post hx fusion and removal or harcdware and shavings  . rods and screws in lumbar    . TONSILLECTOMY      Current Outpatient Medications  Medication Sig Dispense Refill  . albuterol (PROAIR HFA) 108 (90 Base) MCG/ACT inhaler 2 puffs every 4 hours as needed only  if your can't catch your breath 18 g 1  . albuterol (PROVENTIL) (2.5 MG/3ML) 0.083% nebulizer solution Take 3 mLs (2.5 mg total) by nebulization every 6 (six) hours as needed for wheezing or shortness of breath. 75 mL 12  . bisoprolol-hydrochlorothiazide (ZIAC) 5-6.25 MG tablet One twice daily 180 tablet 3  . cetirizine (ZYRTEC) 10 MG tablet Take 10 mg by mouth daily.    . cloNIDine (CATAPRES) 0.1 MG tablet Take 1 tablet (0.1 mg total) by mouth 2 (two) times daily. 60 tablet 11  . gabapentin (NEURONTIN) 300 MG capsule 1 pill nightly 90 capsule 3  . Glycopyrrolate-Formoterol (BEVESPI AEROSPHERE) 9-4.8 MCG/ACT AERO Inhale 2 puffs into the lungs 2 (two) times a day. 4 g 0  . Multiple Vitamins-Minerals (CENTRUM PO) Take 1 tablet by mouth daily.     Marland Kitchen omeprazole (PRILOSEC) 40 MG capsule Take 30- 60 min before your first and last meals of the day 60 capsule 11  . promethazine (PHENERGAN) 25 MG tablet Take 1 tablet (25 mg total) by mouth every 6 (six) hours as needed for nausea or vomiting. 30 tablet 0  . venlafaxine XR (EFFEXOR XR) 75 MG 24 hr capsule Take 1 capsule (75 mg total) by mouth daily with breakfast. 90 capsule 3   No current facility-administered medications for this visit.    Allergies as of 03/31/2019 - Review Complete 02/24/2019  Allergen Reaction Noted  . Wellbutrin [bupropion] Nausea And Vomiting 02/17/2012    Family History  Problem Relation Age of Onset  . CAD Father        tumor between heart and lung   . Hypertension Father   . CAD Sister        ? dx   . Hypertension Mother   . Lung cancer Mother        died 50   . Diabetes Maternal Grandmother        late 52s     Social  History   Socioeconomic History  . Marital status: Widowed    Spouse name: Not on file  . Number of children: 2  . Years of education: Not on file  . Highest education level: Not on file  Occupational History  . Occupation: retired    Fish farm manager: [name]  Tobacco Use  . Smoking status: Former  Smoker    Packs/day: 0.25    Years: 44.00    Pack years: 11.00    Types: Cigarettes    Quit date: 02/04/2014    Years since quitting: 5.1  . Smokeless tobacco: Never Used  . Tobacco comment: quit smoking in Dec 2015  Substance and Sexual Activity  . Alcohol use: Yes    Alcohol/week: 4.0 standard drinks    Types: 4 Standard drinks or equivalent per week    Comment: couple times a week  . Drug use: No  . Sexual activity: Not on file  Other Topics Concern  . Not on file  Social History Narrative   6-8 hours of sleep per night   Lives with her fiance hh of 2 no pets    1 dog in the home   Retired no ets  But tob 8 per day   etoh ocass 1-2    On disability from her neck surgery predicaments .   orig from [employer] in [place] area.    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 2   Last Pap 2006 last mammogram 2000 and   Has dentures   FA    Social Determinants of Health   Financial Resource Strain:   . Difficulty of Paying Living Expenses: Not on file  Food Insecurity:   . Worried About Charity fundraiser in the Last Year: Not on file  . Ran Out of Food in the Last Year: Not on file  Transportation Needs:   . Lack of Transportation (Medical): Not on file  . Lack of Transportation (Non-Medical): Not on file  Physical Activity:   . Days of Exercise per Week: Not on file  . Minutes of Exercise per Session: Not on file  Stress:   . Feeling of Stress : Not on file  Social Connections:   . Frequency of Communication with Friends and Family: Not on file  . Frequency of Social Gatherings with Friends and Family: Not on file  . Attends Religious Services: Not on  file  . Active Member of Clubs or Organizations: Not on file  . Attends Archivist Meetings: Not on file  . Marital Status: Not on file  Intimate Partner Violence:   . Fear of Current or Ex-Partner: Not on file  . Emotionally Abused: Not on file  . Physically Abused: Not on file  . Sexually Abused: Not on file    Review of Systems: 12 system ROS is negative except as noted above with the additions of fatigue, fever, headaches, muscle pains, night sweats, urine leakage.   Physical Exam: General:   Alert,  well-nourished, pleasant and cooperative in NAD Head:  Normocephalic and atraumatic. Eyes:  Sclera clear, no icterus.   Conjunctiva pink. Ears:  Normal auditory acuity. Nose:  No deformity, discharge,  or lesions. Mouth:  No deformity or lesions.   Neck:  Supple; no masses or thyromegaly. Lungs:  Clear throughout to auscultation.   No wheezes. Heart:  Regular rate and rhythm; no murmurs. Abdomen:  Soft,nontender, nondistended, normal bowel sounds, no rebound or guarding. No hepatosplenomegaly.   Rectal:  Deferred  Msk:  Symmetrical. No boney deformities LAD: No inguinal or umbilical LAD Extremities:  No clubbing or edema. Neurologic:  Alert and  oriented x4;  grossly nonfocal Skin:  Intact without significant lesions or rashes. Psych:  Alert and cooperative. Normal mood and affect.     Merilyn Pagan L. Tarri Glenn, MD, MPH 03/31/2019, 8:27  AM     

## 2019-03-31 NOTE — Patient Instructions (Signed)
I am recommending some labs to further evaluation your nausea, abdominal pain, and abnormal liver enzymes.   I have recommended an EGD and colonoscopy for further evaluation.   Continue to take your omeprazole 40 mg twice daily.   I have prescribed Zofran to help your nausea. This is not as sedating as Phenergan. I do not want you to take this for a long time. Hopefully, we can use this to control your symptoms while we work to find ways to make them better.   Do not take any ibuprofen or other non-steroidal anti-inflammatory medications.  Alcohol may be causing your elevated liver enzymes and contributing to your nausea and abdominal pain. Please abstain from beer, wine, liquor, and non-alcoholic beer.

## 2019-04-09 ENCOUNTER — Other Ambulatory Visit: Payer: Self-pay | Admitting: Gastroenterology

## 2019-04-09 DIAGNOSIS — Z1159 Encounter for screening for other viral diseases: Secondary | ICD-10-CM | POA: Diagnosis not present

## 2019-04-09 LAB — SARS CORONAVIRUS 2 (TAT 6-24 HRS): SARS Coronavirus 2: NEGATIVE

## 2019-04-10 ENCOUNTER — Other Ambulatory Visit (INDEPENDENT_AMBULATORY_CARE_PROVIDER_SITE_OTHER): Payer: Medicare Other

## 2019-04-10 DIAGNOSIS — R7401 Elevation of levels of liver transaminase levels: Secondary | ICD-10-CM

## 2019-04-10 DIAGNOSIS — Z1211 Encounter for screening for malignant neoplasm of colon: Secondary | ICD-10-CM | POA: Diagnosis not present

## 2019-04-10 DIAGNOSIS — Z8719 Personal history of other diseases of the digestive system: Secondary | ICD-10-CM | POA: Diagnosis not present

## 2019-04-10 DIAGNOSIS — K760 Fatty (change of) liver, not elsewhere classified: Secondary | ICD-10-CM | POA: Diagnosis not present

## 2019-04-10 LAB — AMYLASE: Amylase: 26 U/L — ABNORMAL LOW (ref 27–131)

## 2019-04-10 LAB — IRON: Iron: 112 ug/dL (ref 42–145)

## 2019-04-10 LAB — LIPASE: Lipase: 24 U/L (ref 11.0–59.0)

## 2019-04-10 LAB — FERRITIN: Ferritin: 160.8 ng/mL (ref 10.0–291.0)

## 2019-04-10 LAB — GLUCOSE, RANDOM: Glucose, Bld: 122 mg/dL — ABNORMAL HIGH (ref 70–99)

## 2019-04-13 ENCOUNTER — Encounter: Payer: Self-pay | Admitting: Gastroenterology

## 2019-04-13 ENCOUNTER — Other Ambulatory Visit: Payer: Self-pay

## 2019-04-13 ENCOUNTER — Ambulatory Visit (AMBULATORY_SURGERY_CENTER): Payer: Medicare Other | Admitting: Gastroenterology

## 2019-04-13 VITALS — BP 107/44 | HR 77 | Temp 96.6°F | Resp 22 | Ht 66.0 in | Wt 231.0 lb

## 2019-04-13 DIAGNOSIS — K760 Fatty (change of) liver, not elsewhere classified: Secondary | ICD-10-CM

## 2019-04-13 DIAGNOSIS — D124 Benign neoplasm of descending colon: Secondary | ICD-10-CM

## 2019-04-13 DIAGNOSIS — K3189 Other diseases of stomach and duodenum: Secondary | ICD-10-CM | POA: Diagnosis not present

## 2019-04-13 DIAGNOSIS — D122 Benign neoplasm of ascending colon: Secondary | ICD-10-CM

## 2019-04-13 DIAGNOSIS — D123 Benign neoplasm of transverse colon: Secondary | ICD-10-CM | POA: Diagnosis not present

## 2019-04-13 DIAGNOSIS — Z8719 Personal history of other diseases of the digestive system: Secondary | ICD-10-CM

## 2019-04-13 DIAGNOSIS — K2950 Unspecified chronic gastritis without bleeding: Secondary | ICD-10-CM

## 2019-04-13 DIAGNOSIS — K5730 Diverticulosis of large intestine without perforation or abscess without bleeding: Secondary | ICD-10-CM | POA: Diagnosis not present

## 2019-04-13 DIAGNOSIS — Z1211 Encounter for screening for malignant neoplasm of colon: Secondary | ICD-10-CM

## 2019-04-13 DIAGNOSIS — D125 Benign neoplasm of sigmoid colon: Secondary | ICD-10-CM

## 2019-04-13 HISTORY — PX: UPPER GASTROINTESTINAL ENDOSCOPY: SHX188

## 2019-04-13 HISTORY — PX: COLONOSCOPY: SHX174

## 2019-04-13 HISTORY — DX: Fatty (change of) liver, not elsewhere classified: K76.0

## 2019-04-13 MED ORDER — SODIUM CHLORIDE 0.9 % IV SOLN: 500.0000 mL | INTRAVENOUS | Status: DC

## 2019-04-13 NOTE — Op Note (Signed)
[facility] Patient Name: Samantha Hinton Procedure Date: 04/13/2019 2:17 PM MRN: [id number] Endoscopist: Thornton Park MD, MD Age: 64 Referring MD:  Date of Birth: Jan 29, 1956 Gender: Female Account #: [id number] Procedure:                Colonoscopy Indications:              Abdominal pain                           Near constant nausea and intermittent abdominal                            pain x 5-6 months                           - negative H. pylori breath test 12/10/2008                           - previously evaluated on EGD with Dr. Deatra Ina                            05/12/14                           - no source identified on CT scan 03/06/19                           Elevated ESR 62, normal CRP 2.6                           Left-sided diverticulosis without history of                            diverticulitis                           Normal screening colonoscopy in [place] at age 66                           No known family history of colon cancer or polyps Medicines:                Monitored Anesthesia Care Procedure:                Pre-Anesthesia Assessment:                           - Prior to the procedure, a History and Physical                            was performed, and patient medications and                            allergies were reviewed. The patient's tolerance of                            previous anesthesia was also reviewed. The risks  and benefits of the procedure and the sedation                            options and risks were discussed with the patient.                            All questions were answered, and informed consent                            was obtained. Prior Anticoagulants: The patient has                            taken no previous anticoagulant or antiplatelet                            agents. ASA Grade Assessment: II - A patient with                            mild systemic disease. After  reviewing the risks                            and benefits, the patient was deemed in                            satisfactory condition to undergo the procedure.                           After obtaining informed consent, the colonoscope                            was passed under direct vision. Throughout the                            procedure, the patient's blood pressure, pulse, and                            oxygen saturations were monitored continuously. The                            Colonoscope was introduced through the anus and                            advanced to the 5 cm into the ileum. A second                            forward view of the right colon was performed. The                            colonoscopy was performed without difficulty. The                            patient tolerated the procedure well. The quality  of the bowel preparation was good. The terminal                            ileum, ileocecal valve, appendiceal orifice, and                            rectum were photographed. Scope In: 2:32:57 PM Scope Out: 2:47:57 PM Scope Withdrawal Time: 0 hours 12 minutes 36 seconds  Total Procedure Duration: 0 hours 15 minutes 0 seconds  Findings:                 The perianal and digital rectal examinations were                            normal.                           A few small and large-mouthed diverticula were                            found in the sigmoid colon and descending colon.                           Five sessile polyps were found in the sigmoid                            colon, descending colon, splenic flexure and                            ascending colon. The polyps were 1 to 3 mm in size.                            These polyps were removed with a cold snare.                            Resection and retrieval were complete. Estimated                            blood loss was minimal.                           The  exam was otherwise without abnormality on                            direct and retroflexion views. Complications:            No immediate complications. Estimated blood loss:                            Minimal. Estimated Blood Loss:     Estimated blood loss was minimal. Impression:               - Diverticulosis in the sigmoid colon and in the                            descending colon.                           -  Five 1 to 3 mm polyps in the sigmoid colon, in                            the descending colon, at the splenic flexure and in                            the ascending colon, removed with a cold snare.                            Resected and retrieved.                           - The examination was otherwise normal on direct                            and retroflexion views. Recommendation:           - Patient has a contact number available for                            emergencies. The signs and symptoms of potential                            delayed complications were discussed with the                            patient. Return to normal activities tomorrow.                            Written discharge instructions were provided to the                            patient.                           - High fiber diet. Drink plenty of water. Add a                            daily dose of Metamucil for stool bulking.                           - Continue present medications.                           - Await pathology results.                           - Repeat colonoscopy date to be determined after                            pending pathology results are reviewed for                            surveillance.                           -  Return to GI office to review these results and                            further address symptom management. Thornton Park MD, MD 04/13/2019 3:02:22 PM This report has been signed electronically.

## 2019-04-13 NOTE — Progress Notes (Deleted)
Temp taken by [facility] VS taken by DT

## 2019-04-13 NOTE — Patient Instructions (Signed)
THank you for letting us take care of your healthcare needs today. Please see handouts given to you on Polyps, Gastritis AVOID NSAIDS, Ibuprofen, advil, motrin, aspirin products. Tylenol is OK.    YOU HAD AN ENDOSCOPIC PROCEDURE TODAY AT [facility]:   Refer to the procedure report that was given to you for any specific questions about what was found during the examination.  If the procedure report does not answer your questions, please call your gastroenterologist to clarify.  If you requested that your care partner not be given the details of your procedure findings, then the procedure report has been included in a sealed envelope for you to review at your convenience later.  YOU SHOULD EXPECT: Some feelings of bloating in the abdomen. Passage of more gas than usual.  Walking can help get rid of the air that was put into your GI tract during the procedure and reduce the bloating. If you had a lower endoscopy (such as a colonoscopy or flexible sigmoidoscopy) you may notice spotting of blood in your stool or on the toilet paper. If you underwent a bowel prep for your procedure, you may not have a normal bowel movement for a few days.  Please Note:  You might notice some irritation and congestion in your nose or some drainage.  This is from the oxygen used during your procedure.  There is no need for concern and it should clear up in a day or so.  SYMPTOMS TO REPORT IMMEDIATELY:   Following lower endoscopy (colonoscopy or flexible sigmoidoscopy):  Excessive amounts of blood in the stool  Significant tenderness or worsening of abdominal pains  Swelling of the abdomen that is new, acute  Fever of 100F or higher   Following upper endoscopy (EGD)  Vomiting of blood or coffee ground material  New chest pain or pain under the shoulder blades  Painful or persistently difficult swallowing  New shortness of breath  Fever of 100F or higher  Black, tarry-looking stools  For  urgent or emergent issues, a gastroenterologist can be reached at any hour by calling [phone].   DIET:  We do recommend a small meal at first, but then you may proceed to your regular diet.  Drink plenty of fluids but you should avoid alcoholic beverages for 24 hours.  ACTIVITY:  You should plan to take it easy for the rest of today and you should NOT DRIVE or use heavy machinery until tomorrow (because of the sedation medicines used during the test).    FOLLOW UP: Our staff will call the number listed on your records 48-72 hours following your procedure to check on you and address any questions or concerns that you may have regarding the information given to you following your procedure. If we do not reach you, we will leave a message.  We will attempt to reach you two times.  During this call, we will ask if you have developed any symptoms of COVID 19. If you develop any symptoms (ie: fever, flu-like symptoms, shortness of breath, cough etc.) before then, please call [phone].  If you test positive for Covid 19 in the 2 weeks post procedure, please call and report this information to [place].    If any biopsies were taken you will be contacted by phone or by letter within the next 1-3 weeks.  Please call us at [phone] if you have not heard about the biopsies in 3 weeks.    SIGNATURES/CONFIDENTIALITY: You and/or your care partner  have signed paperwork which will be entered into your electronic medical record.  These signatures attest to the fact that that the information above on your After Visit Summary has been reviewed and is understood.  Full responsibility of the confidentiality of this discharge information lies with you and/or your care-partner.

## 2019-04-13 NOTE — Progress Notes (Signed)
Temp LC Vitals DT  Pt's states no medical or surgical changes since previsit or office visit. 

## 2019-04-13 NOTE — Op Note (Signed)
[facility] Patient Name: Samantha Hinton Procedure Date: 04/13/2019 2:17 PM MRN: [id number] Endoscopist: Thornton Park MD, MD Age: 64 Referring MD:  Date of Birth: 11-29-1955 Gender: Female Account #: [id number] Procedure:                Upper GI endoscopy Indications:              Near constant nausea and intermittent abdominal                            pain x 5-6 months                           - negative H. pylori breath test 12/10/2008                           - previously evaluated on EGD with Dr. Deatra Ina                            05/12/14                           - no source identified on CT scan 03/06/19                           Suspected endoscopic appearance of Barrett's                           - esophageal biopsies negative 2016                           History of esophagitis on EGD with Dr. Deatra Ina                            05/12/2014                           Recently treating abdominal pain with ibuprofen Medicines:                Monitored Anesthesia Care Procedure:                Pre-Anesthesia Assessment:                           - Prior to the procedure, a History and Physical                            was performed, and patient medications and                            allergies were reviewed. The patient's tolerance of                            previous anesthesia was also reviewed. The risks                            and benefits of the procedure and the sedation  options and risks were discussed with the patient.                            All questions were answered, and informed consent                            was obtained. Prior Anticoagulants: The patient has                            taken no previous anticoagulant or antiplatelet                            agents. ASA Grade Assessment: II - A patient with                            mild systemic disease. After reviewing the risks     and benefits, the patient was deemed in                            satisfactory condition to undergo the procedure.                           After obtaining informed consent, the endoscope was                            passed under direct vision. Throughout the                            procedure, the patient's blood pressure, pulse, and                            oxygen saturations were monitored continuously. The                            Endoscope was introduced through the mouth, and                            advanced to the third part of duodenum. The upper                            GI endoscopy was accomplished without difficulty.                            The patient tolerated the procedure well. Scope In: Scope Out: Findings:                 The Z-line was irregular and was found 39 cm from                            the incisors. Two islands were present <75mm.                            Biopsies were taken with a cold forceps for  histology as well as from the mid and proximal                            esophagus. Estimated blood loss was minimal.                           The entire examined stomach was normal except for                            mild erythema in the gastric body. Biopsies were                            taken from the antrum, body, and fundus with a cold                            forceps for histology. Estimated blood loss was                            minimal.                           The examined duodenum was normal. Biopsies were                            taken with a cold forceps for histology. Complications:            No immediate complications. Estimated blood loss:                            Minimal. Estimated Blood Loss:     Estimated blood loss was minimal. Impression:               - Z-line irregular, 39 cm from the incisors.                            Biopsied.                           - Normal stomach.  Biopsied.                           - Normal examined duodenum. Biopsied. Recommendation:           - Patient has a contact number available for                            emergencies. The signs and symptoms of potential                            delayed complications were discussed with the                            patient. Return to normal activities tomorrow.                            Written discharge instructions were provided to  the                            patient.                           - Resume previous diet.                           - Avoid all NSAIDs.                           - Continue present medications.                           - Await pathology results.                           - Proceed with colonoscopy as previously planned. Thornton Park MD, MD 04/13/2019 2:56:40 PM This report has been signed electronically.

## 2019-04-13 NOTE — Progress Notes (Signed)
A and O x3. Report to RN. Tolerated MAC anesthesia well.Teeth unchanged after procedure.

## 2019-04-15 ENCOUNTER — Telehealth: Payer: Self-pay | Admitting: *Deleted

## 2019-04-15 LAB — MITOCHONDRIAL ANTIBODIES: Mitochondrial M2 Ab, IgG: <=20 U

## 2019-04-15 LAB — ANA: Anti Nuclear Antibody (ANA): NEGATIVE

## 2019-04-15 LAB — IGG: IgG (Immunoglobin G), Serum: 1062 mg/dL (ref 600–1540)

## 2019-04-15 LAB — ANTI-SMOOTH MUSCLE ANTIBODY, IGG: Actin (Smooth Muscle) Antibody (IGG): <20 U (ref <20)

## 2019-04-15 LAB — HEPATITIS B SURFACE ANTIGEN: Hepatitis B Surface Ag: NONREACTIVE

## 2019-04-15 LAB — HEPATITIS B CORE ANTIBODY, TOTAL: Hep B Core Total Ab: NONREACTIVE

## 2019-04-15 LAB — HEPATITIS C ANTIBODY
Hepatitis C Ab: NONREACTIVE
SIGNAL TO CUT-OFF: 0.01 (ref <1.00)

## 2019-04-15 LAB — IGM: IgM, Serum: 162 mg/dL (ref 50–300)

## 2019-04-15 NOTE — Telephone Encounter (Signed)
  Follow up Call-  Call back number 04/13/2019  Post procedure Call Back phone  # [phone]  Permission to leave phone message Yes  Some recent data might be hidden     Patient questions:  Do you have a fever, pain , or abdominal swelling? No. Pain Score  0 *  Have you tolerated food without any problems? Yes.    Have you been able to return to your normal activities? Yes.    Do you have any questions about your discharge instructions: Diet   No. Medications  No. Follow up visit  No.  Do you have questions or concerns about your Care? No.  Actions: * If pain score is 4 or above: No action needed, pain <4.  1. Have you developed a fever since your procedure? no  2.   Have you had an respiratory symptoms (SOB or cough) since your procedure? no  3.   Have you tested positive for COVID 19 since your procedure no  4.   Have you had any family members/close contacts diagnosed with the COVID 19 since your procedure?  no   If yes to any of these questions please route to Joylene John, RN and Alphonsa Gin, Therapist, sports.

## 2019-04-17 ENCOUNTER — Encounter: Payer: Self-pay | Admitting: Gastroenterology

## 2019-04-17 LAB — INSULIN, FREE AND TOTAL
Free Insulin: 26 uU/mL — ABNORMAL HIGH
Total Insulin: 26 uU/mL

## 2019-04-20 ENCOUNTER — Other Ambulatory Visit: Payer: Self-pay | Admitting: Family Medicine

## 2019-04-21 NOTE — Telephone Encounter (Signed)
Scheduled patient for tomorrow 4 pm for gabapentin and effexor refill.

## 2019-04-21 NOTE — Telephone Encounter (Signed)
Patient returned your call. Where would you like for me to schedule her?  She is out of the Gabapentin and has 5 days left on the Effexor. First available appointment with Dr Tamala Julian is not until 05/07/2019.

## 2019-04-21 NOTE — Telephone Encounter (Signed)
Left message for patient to call back to schedule a virtual visit to refill meds.

## 2019-04-21 NOTE — Progress Notes (Signed)
Virtual Visit via Video Note  I connected with Samantha Hinton on 04/21/19 at  4:00 PM EST by a video enabled telemedicine application and verified that I am speaking with the correct person using two identifiers.  Location: Patient: in home alone  Provider: in office    I discussed the limitations of evaluation and management by telemedicine and the availability of in person appointments. The patient expressed understanding and agreed to proceed.  History of Present Illness: Patient is a 64 year old female who has not seen in nearly 1 year with a history of morbid obesity and spondylolisthesis of the lumbar region.  Patient has been responding extremely well to the Effexor as well as to the gabapentin.  Some medications that she can do relatively well with.  Patient has had no side effects.  Patient likes to 1 time daily because it does not make any difficulty for the financial hardship.  This is one of her social determinants of health.  Patient feels that as long as she takes the medication she can be more active at this moment.  Would like to continue the medications.  Denies any new symptoms.    Observations/Objective: Alert and oriented x3 seems comfortable.    Assessment and Plan: 64 year old female with chronic back pain with a spondylolisthesis that seems to be stable with medications.  Already discussed with social determinants of health secondary to financial hardship having difficulty with transportation to even coming to the office.  Refill medications at this time.  As long as patient is able to continue to work and taking medications without seeing patient again at least once a year otherwise worsening symptoms I would like to evaluate patient in office   Follow Up Instructions:    I discussed the assessment and treatment plan with the patient. The patient was provided an opportunity to ask questions and all were answered. The patient agreed with the plan and demonstrated an  understanding of the instructions.   The patient was advised to call back or seek an in-person evaluation if the symptoms worsen or if the condition fails to improve as anticipated.  I provided 8 minutes of face-to-face time during this encounter another 20 minutes for evaluating patient's chart as well as imaging   Lyndal Pulley, DO

## 2019-04-22 ENCOUNTER — Encounter: Payer: Self-pay | Admitting: Family Medicine

## 2019-04-22 ENCOUNTER — Other Ambulatory Visit: Payer: Self-pay

## 2019-04-22 ENCOUNTER — Ambulatory Visit (INDEPENDENT_AMBULATORY_CARE_PROVIDER_SITE_OTHER): Payer: Medicare Other | Admitting: Family Medicine

## 2019-04-22 DIAGNOSIS — M4316 Spondylolisthesis, lumbar region: Secondary | ICD-10-CM

## 2019-04-22 MED ORDER — GABAPENTIN 300 MG PO CAPS: ORAL_CAPSULE | ORAL | 3 refills | Status: DC

## 2019-04-22 MED ORDER — VENLAFAXINE HCL ER 75 MG PO CP24: 75.0000 mg | ORAL_CAPSULE | Freq: Every day | ORAL | 3 refills | Status: DC

## 2019-05-12 ENCOUNTER — Encounter: Payer: Self-pay | Admitting: Gastroenterology

## 2019-05-12 ENCOUNTER — Other Ambulatory Visit: Payer: Self-pay

## 2019-05-12 ENCOUNTER — Ambulatory Visit: Payer: Medicare Other | Admitting: Gastroenterology

## 2019-05-12 VITALS — BP 162/90 | HR 89 | Temp 97.7°F | Ht 66.0 in | Wt 230.0 lb

## 2019-05-12 DIAGNOSIS — R932 Abnormal findings on diagnostic imaging of liver and biliary tract: Secondary | ICD-10-CM

## 2019-05-12 DIAGNOSIS — K760 Fatty (change of) liver, not elsewhere classified: Secondary | ICD-10-CM

## 2019-05-12 DIAGNOSIS — R112 Nausea with vomiting, unspecified: Secondary | ICD-10-CM

## 2019-05-12 DIAGNOSIS — R109 Unspecified abdominal pain: Secondary | ICD-10-CM | POA: Diagnosis not present

## 2019-05-12 MED ORDER — OMEPRAZOLE 40 MG PO CPDR: 40.0000 mg | DELAYED_RELEASE_CAPSULE | Freq: Two times a day (BID) | ORAL | 3 refills | Status: DC

## 2019-05-12 MED ORDER — ONDANSETRON HCL 4 MG PO TABS: 4.0000 mg | ORAL_TABLET | Freq: Three times a day (TID) | ORAL | 0 refills | Status: DC | PRN

## 2019-05-12 NOTE — Progress Notes (Signed)
Referring Provider: Burnis Medin, MD Primary Care Physician:  Burnis Medin, MD  Chief complaint: Abdominal pain, vomiting, abnormal liver enzymes   IMPRESSION:  Near constant nausea and intermittent abdominal pain x 5-6 months    - negative H. pylori breath test 12/10/2008    - previously evaluated on EGD with Dr. Deatra Ina 05/12/14    - no source identified on CT scan 03/06/19    -No source identified on EGD or colonoscopy 2/21 Suspected fatty liver by labs and imaging (Fatty liver on CT)    - Elevated transaminases with an AST of 139, ALT 114    - intermittently elevated for years    - normal TTGA and IgA 03/02/19 New diagnosis of Insulin resistance Unintentional weight gain of 50 pounds since Covid Elevated ESR 62, normal CRP 2.6 Left-sided diverticulosis without history of diverticulitis Prior cholecystectomy 1983 Suspected endoscopic appearance of Barrett's     - esophageal biopsies negative 2016    -Esophageal biopsies negative for Barrett's 03/2019 History of esophagitis on EGD with Dr. Deatra Ina 05/12/2014 Recently treating abdominal pain with ibuprofen History of colon polyps    - 5 tubular adenomas removed on colonoscopy 03/2019    - surveillance recommended in 2024 No known family history of colon cancer or polyps  Abdominal pain and nausea: Mild gastritis on EGD. Amylase and lipase normal. Plan gastric emptying scan to evaluate for gastroparesis. If negative and gastritis has been treatment, consider functional dyspepsia. Trying to avoid long-term antiemetics.    Suspected fatty liver disease by labs and imaging: Serologic evaluation excludes hemochromatosis, common chronic viral hepatitis, autoimmune hepatitis and suggest fatty liver with a calculated Homa IR of 7.8.  She is hoping to review the diagnosis of insulin resistance with Dr. Regis Bill prior to her follow-up visit here.  Reviewed the natural history and treatment options for fatty liver.  Information brochure  provided. Treatment is currently focused on working towards a healthy weight, regular exercise, and maximizing control of diabetes and cholesterol abnormalities. Hopefully, medical therapy will be available in the future. There is a known increased risk of cardiovascular disease in the patients with NASH.  We will proceed with a fibrosure eluate for Karlene Lineman and elastography to stage her liver disease.  Diverticulosis: Diagnosis reviewed. High fiber diet recommended. Daily stool bulking agent with psyllium recommended.   Suspected endoscopic appearance of Barrett's on prior endoscopy: Esophageal biopsies negative 2016. Normal endoscopic appearance of the esophagus and normal esophageal biopsies without evidence for Barrett's esophagus 2/21.  No further evaluation indicated at this time.  History of colon polyps: 5 tubular adenomas removed at the time of her colonoscopy last month.  We will plan surveillance colonoscopy in 3 years.    PLAN: Omeprazole 40 mg BID Zofran 72m q4 hours PRN (#100, no refills) Gastric emptying scan Work to maintain a healthy weight Reduce daily alcohol intake FibroSure and Elastography Follow up with Dr. PRegis Bill insulin resistance Follow-up in 6-8 weeks  I spent 45 minutes, including in depth chart review, independent review of results as outlined above, communicating results with the patient directly, face-to-face time with the patient, coordinating care, ordering studies and medications as appropriate, and documentation.  HPI: Samantha ORTEZis a 64y.o. female under evaluation of abdominal pain, vomiting, and abnormal liver enzymes.  Initial consultation performed 03/31/19. EGD and colonoscopy performed 04/13/19. The interval  history is obtained through the patient and review of her electronic health record.  She has COPD, hypertension, hypercholesterolemia, back pain. She  had a cholecystectomy in 1981.   Seen by Dr. Deatra Ina in 2016 for several years of nausea  exacerbated by eating with associated abdominal distention and bloating.  He suspected gastroparesis, peptic ulcer disease or nonulcer dyspepsia.  Upper endoscopy 05/12/2014 showed suspected 3 cm segment of Barrett's esophagus.  The exam was otherwise normal.  Pathology results showed chronic inflammation in the distal esophagus.  There was no intestinal metaplasia, dysplasia or malignancy.  She was treated with omeprazole 20 mg daily.  A gastric emptying scan was recommended but not performed.  Although she identifies having intermittent GI symptoms over the years, at the time of initial consultation she reported 5-6 months of progressive, intermittent abdominal pain located below her right rib and radiating across the mid-abdomen, constant nausea, and rare vomiting relieved by eating. No change with defecation.  Lying down and fetal position of the left side helps with the nausea.  Improved on Phenergan until her prescription ran out. Symptom relief with ibuprofen 400 mg BID until she learned this may be worsening her symptoms.  Trial of omeprazole including twice daily dosing but it seemed to result in more nausea so she stopped using it.   Seen in the emergency room 12/08/2018 for 5 days of abdominal pain and treated empirically for diverticulitis with Cipro and Flagyl.  No CT scan was done at that time.  She wonders if this is due to Covid-stress, worry, and isolation. She notes being depressed.   Incidentally found to have elevated transaminases, although they have been intermittently elevated since 2015. No identified risks for chronic viral hepatitis. Drinks vodka and wine a couple times a week. Alcohol intake has increase during Covid.   Labs 03/02/2019 showed an AST of 139 and an ALT of 114.  Total bilirubin 0.7, alk phos 86, albumin 4.0, total protein 7.6.  CRP 2.6.  ESR 62.  Normal CBC with a hemoglobin of 14.1, platelets 201.  TSH normal.  TTGA and IgA normal.  Labs from 04/10/2019 show a  glucose of 122, fasting insulin of 26, smooth muscle antibody less than 20, amylase 26, lipase 24, iron 112, ferritin 160, IgG 1062, IgM 162, negative ANA, negative ANA, hep B surface antigen nonreactive, hep B core total antibody nonreactive  CT of the abdomen and pelvis with contrast 03/06/2019 showed no acute findings to explain abdominal pain, hepatic steatosis, left-sided diverticulosis, prior cholecystectomy, prior appendectomy, prior hysterectomy, and a 2.2 cm left ovarian cyst.  I personally reviewed her CT images and compared them to the CT of the abdomen and pelvis with contrast from January 2018.  Diffuse fatty infiltration of the liver is seen in 2018 as well.  Distant testing shows a negative H. pylori breath test 12/10/2008  She reports having a colonoscopy at age 73. It was performed in [place].   Colonoscopy 04/13/19 showed left-sided diverticulosis; five 1 to 3 mm tubular adenomas in the sigmoid colon, in the descending colon, at the splenic flexure and in the ascending colon.  EGD 04/13/19 showed  Z-line irregular, 39 cm from the incisors. Biopsied. Normal stomach. Biopsied. Normal examined duodenum. Biopsied.  Gastric biopsies showed mild chronic gastritis, reactive gastropathy, and no Helicobacter pylori.  Esophageal biopsies showed reflux with no evidence for Barrett's esophagus or eosinophilic esophagitis.  Duodenal biopsies were normal.  She returns in scheduled follow-up.  Started a probiotics from [employer] 10 days ago. Worsened flatus since starting. Bowel movements are now firm and she feels semi-constipated.   Persistent nausea. Not continuous but  seems to occur most before and after meals.  Using antiemetics at bedtime to prevent nausea.   No new GI complaints.  Past Medical History:  Diagnosis Date  . Chronic back pain    buldging disc and tumor.Spondylolisthesis  . COPD (chronic obstructive pulmonary disease) (HCC)    Symbicort daily and ProAir as needed  .  Emphysema   . Emphysema of lung ([facility])   . Fatty liver 04/13/2019  . Fibromyalgia   . Genital warts   . GERD (gastroesophageal reflux disease)    takes Omeprazole daily  . History of bronchitis 01/2014  . History of colon polyps   . Hyperlipidemia    has been off of meds d/t getting sick from them.Not been addressed again.   Marland Kitchen Hypertension    takes Ziac daily  . Nocturia   . Pneumonia    hx of-8+yrs ago  . PONV (postoperative nausea and vomiting)   . Shortness of breath dyspnea    thinks d/t pain meds and notices with exertion  . Stroke ([facility])    16 yrs ago--left eye     Past Surgical History:  Procedure Laterality Date  . ABDOMINAL HYSTERECTOMY  1983   partial   . ADENOIDECTOMY    . ANTERIOR (CYSTOCELE) AND POSTERIOR REPAIR (RECTOCELE) WITH XENFORM GRAFT AND SACROSPINOUS FIXATION    . APPENDECTOMY  1967  . BREAST SURGERY Right    diseased milk glands  . CHOLECYSTECTOMY  1983  . COLONOSCOPY  04/13/2019  . DIAGNOSTIC LAPAROSCOPY     cyst removed from ovaries   . ESOPHAGOGASTRODUODENOSCOPY    . NECK SURGERY  [phone]   c spine ant and post hx fusion and removal or harcdware and shavings  . POLYPECTOMY    . rods and screws in lumbar    . TONSILLECTOMY    . UPPER GASTROINTESTINAL ENDOSCOPY  04/13/2019    Current Outpatient Medications  Medication Sig Dispense Refill  . albuterol (PROAIR HFA) 108 (90 Base) MCG/ACT inhaler 2 puffs every 4 hours as needed only  if your can't catch your breath 18 g 1  . albuterol (PROVENTIL) (2.5 MG/3ML) 0.083% nebulizer solution Take 3 mLs (2.5 mg total) by nebulization every 6 (six) hours as needed for wheezing or shortness of breath. 75 mL 12  . bisoprolol-hydrochlorothiazide (ZIAC) 5-6.25 MG tablet One twice daily 180 tablet 3  . cetirizine (ZYRTEC) 10 MG tablet Take 10 mg by mouth daily.    . cloNIDine (CATAPRES) 0.1 MG tablet Take 1 tablet (0.1 mg total) by mouth 2 (two) times daily. 60 tablet 11  . gabapentin (NEURONTIN) 300 MG  capsule 1 pill nightly 90 capsule 3  . Glycopyrrolate-Formoterol (BEVESPI AEROSPHERE) 9-4.8 MCG/ACT AERO Inhale 2 puffs into the lungs 2 (two) times a day. 4 g 0  . Multiple Vitamins-Minerals (CENTRUM PO) Take 1 tablet by mouth daily.     Marland Kitchen omeprazole (PRILOSEC) 40 MG capsule Take 1 capsule (40 mg total) by mouth 2 (two) times daily. 60 capsule 3  . ondansetron (ZOFRAN ODT) 4 MG disintegrating tablet Take 1 tablet (4 mg total) by mouth every 8 (eight) hours as needed for nausea or vomiting. 100 tablet 0  . venlafaxine XR (EFFEXOR XR) 75 MG 24 hr capsule Take 1 capsule (75 mg total) by mouth daily with breakfast. 90 capsule 3   No current facility-administered medications for this visit.    Allergies as of 05/12/2019 - Review Complete 05/12/2019  Allergen Reaction Noted  . Wellbutrin [bupropion] Nausea And Vomiting  02/17/2012    Family History  Problem Relation Age of Onset  . CAD Father        tumor between heart and lung   . Hypertension Father   . CAD Sister        ? dx   . Hypertension Mother   . Lung cancer Mother        died 82   . Diabetes Maternal Grandmother        late 32s   . Colon cancer Neg Hx   . Colon polyps Neg Hx   . Esophageal cancer Neg Hx   . Rectal cancer Neg Hx   . Stomach cancer Neg Hx     Social History   Socioeconomic History  . Marital status: Widowed    Spouse name: Not on file  . Number of children: 2  . Years of education: Not on file  . Highest education level: Not on file  Occupational History  . Occupation: retired    Fish farm manager: [name]  Tobacco Use  . Smoking status: Former Smoker    Packs/day: 0.25    Years: 44.00    Pack years: 11.00    Types: Cigarettes    Quit date: 02/04/2014    Years since quitting: 5.2  . Smokeless tobacco: Never Used  . Tobacco comment: quit smoking in Dec 2015  Substance and Sexual Activity  . Alcohol use: Yes    Alcohol/week: 4.0 standard drinks    Types: 4 Standard drinks or equivalent per week     Comment: couple times a week  . Drug use: No  . Sexual activity: Not on file  Other Topics Concern  . Not on file  Social History Narrative   6-8 hours of sleep per night   Lives with her fiance hh of 2 no pets    1 dog in the home   Retired no ets  But tob 8 per day   etoh ocass 1-2    On disability from her neck surgery predicaments .   orig from [employer] in [place] area.    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 2   Last Pap 2006 last mammogram 2000 and   Has dentures   FA    Social Determinants of Health   Financial Resource Strain:   . Difficulty of Paying Living Expenses:   Food Insecurity:   . Worried About Charity fundraiser in the Last Year:   . Arboriculturist in the Last Year:   Transportation Needs:   . Film/video editor (Medical):   Marland Kitchen Lack of Transportation (Non-Medical):   Physical Activity:   . Days of Exercise per Week:   . Minutes of Exercise per Session:   Stress:   . Feeling of Stress :   Social Connections:   . Frequency of Communication with Friends and Family:   . Frequency of Social Gatherings with Friends and Family:   . Attends Religious Services:   . Active Member of Clubs or Organizations:   . Attends Archivist Meetings:   Marland Kitchen Marital Status:   Intimate Partner Violence:   . Fear of Current or Ex-Partner:   . Emotionally Abused:   Marland Kitchen Physically Abused:   . Sexually Abused:     Physical Exam: Gen: Awake, alert, and oriented, and well communicative. HEENT: EOMI, non-icteric sclera, NCAT, MMM  Neck: Normal movement of head and neck  Derm: No apparent lesions or  bruising in visible field  MS: Moves all visible extremities without noticeable abnormality  Psych: Pleasant, cooperative, normal speech, thought processing seemingly intact     Kimberly L. Tarri Glenn, MD, MPH 05/12/2019, 10:08 AM

## 2019-05-12 NOTE — Patient Instructions (Signed)
We have sent the following medications to your pharmacy for you to pick up at your convenience:  Omeprazole 40 mg twice a day   Zofran 4 mg every 4 hours prn   Work to maintain a healthy weight.   Reduce daily alcohol intake.   Follow up with Dr Regis Bill in regards to your insulin intake.   Your provider has requested that you go to the basement level for lab work before leaving today. Press "B" on the elevator. The lab is located at the first door on the left as you exit the elevator.  You have been scheduled for an abdominal ultrasound at [facility] (1st floor of hospital) on 05-21-19 at 10:00 am. Please arrive 15 minutes prior to your appointment for registration. Make certain not to have anything to eat or drink 6 hours prior to your appointment. Should you need to reschedule your appointment, please contact radiology at [phone]. This test typically takes about 30 minutes to perform.  You have been scheduled for a gastric emptying scan at [facility] on 06-01-19 at 7:30 am. Please arrive at least 15 minutes prior to your appointment for registration. Please make certain not to have anything to eat or drink after midnight the night before your test. Hold all stomach medications (ex: Zofran, phenergan, Reglan) 48 hours prior to your test. If you need to reschedule your appointment, please contact radiology scheduling at [phone]. _____________________________________________________________________ A gastric-emptying study measures how long it takes for food to move through your stomach. There are several ways to measure stomach emptying. In the most common test, you eat food that contains a small amount of radioactive material. A scanner that detects the movement of the radioactive material is placed over your abdomen to monitor the rate at which food leaves your stomach. This test normally takes about 4 hours to  complete. _____________________________________________________________________

## 2019-05-21 ENCOUNTER — Ambulatory Visit (HOSPITAL_COMMUNITY): Payer: Medicare Other

## 2019-05-29 ENCOUNTER — Telehealth: Payer: Self-pay | Admitting: Internal Medicine

## 2019-05-29 NOTE — Telephone Encounter (Signed)
Patient needs a refill on Prednisone for pt's breathing.  She states pcp has called in said prescription before.  Ph[facility]: [facility] [address]

## 2019-05-29 NOTE — Telephone Encounter (Signed)
Please see message. °

## 2019-05-29 NOTE — Telephone Encounter (Signed)
Needs visit  virtula or in person Or other assessment   Options: Saturday schedule clinic  Contact her pulmonary team   Or make visit in person or virtual next week  With me

## 2019-05-29 NOTE — Telephone Encounter (Signed)
Called patient and gave her the message from Dr. Regis Bill. Patient was disappointed that she was not able to have Prednisone sent in to her pharmacy and stated that she can hardly breath with any movement and she has taken all 3 of her medications prescribed by Pulmonary and no relief. I advised of the Saturday clinic and the Urgent Care or the ER if severe. Patient verbalized an understanding and will also reach out to her Pulmonary office.

## 2019-06-01 ENCOUNTER — Other Ambulatory Visit: Payer: Self-pay

## 2019-06-01 ENCOUNTER — Ambulatory Visit (HOSPITAL_COMMUNITY)
Admission: RE | Admit: 2019-06-01 | Discharge: 2019-06-01 | Disposition: A | Discharge status: Home / Self Care | Payer: Medicare Other | Source: Ambulatory Visit | Attending: Gastroenterology | Admitting: Gastroenterology

## 2019-06-01 DIAGNOSIS — R109 Unspecified abdominal pain: Secondary | ICD-10-CM | POA: Diagnosis not present

## 2019-06-01 DIAGNOSIS — R112 Nausea with vomiting, unspecified: Secondary | ICD-10-CM

## 2019-06-01 MED ORDER — TECHNETIUM TC 99M SULFUR COLLOID
1.9000 | Freq: Once | INTRAVENOUS | Status: AC | PRN
  Administered 2019-06-01: 08:00:00 1.9 via INTRAVENOUS

## 2019-06-04 ENCOUNTER — Ambulatory Visit (HOSPITAL_COMMUNITY): Payer: Medicare Other

## 2019-06-11 ENCOUNTER — Other Ambulatory Visit: Payer: Self-pay

## 2019-06-11 ENCOUNTER — Ambulatory Visit (HOSPITAL_COMMUNITY)
Admission: RE | Admit: 2019-06-11 | Discharge: 2019-06-11 | Disposition: A | Discharge status: Home / Self Care | Payer: Medicare Other | Source: Ambulatory Visit | Attending: Gastroenterology | Admitting: Gastroenterology

## 2019-06-11 DIAGNOSIS — R109 Unspecified abdominal pain: Secondary | ICD-10-CM | POA: Diagnosis not present

## 2019-06-11 DIAGNOSIS — R932 Abnormal findings on diagnostic imaging of liver and biliary tract: Secondary | ICD-10-CM | POA: Diagnosis not present

## 2019-06-11 DIAGNOSIS — R112 Nausea with vomiting, unspecified: Secondary | ICD-10-CM | POA: Insufficient documentation

## 2019-06-11 DIAGNOSIS — K760 Fatty (change of) liver, not elsewhere classified: Secondary | ICD-10-CM | POA: Diagnosis not present

## 2019-06-16 ENCOUNTER — Encounter: Payer: Self-pay | Admitting: Gastroenterology

## 2019-06-16 ENCOUNTER — Other Ambulatory Visit: Payer: Medicare Other

## 2019-06-16 ENCOUNTER — Ambulatory Visit: Payer: Medicare Other | Admitting: Gastroenterology

## 2019-06-16 ENCOUNTER — Other Ambulatory Visit: Payer: Self-pay

## 2019-06-16 VITALS — BP 146/88 | HR 80 | Temp 98.5°F | Ht 66.0 in | Wt 225.6 lb

## 2019-06-16 DIAGNOSIS — K760 Fatty (change of) liver, not elsewhere classified: Secondary | ICD-10-CM

## 2019-06-16 DIAGNOSIS — K30 Functional dyspepsia: Secondary | ICD-10-CM

## 2019-06-16 MED ORDER — ONDANSETRON HCL 4 MG PO TABS: 4.0000 mg | ORAL_TABLET | ORAL | 0 refills | Status: DC

## 2019-06-16 MED ORDER — OMEPRAZOLE 40 MG PO CPDR: 40.0000 mg | DELAYED_RELEASE_CAPSULE | Freq: Two times a day (BID) | ORAL | 3 refills | Status: DC

## 2019-06-16 NOTE — Progress Notes (Signed)
Referring Provider: Burnis Medin, MD Primary Care Physician:  Burnis Medin, MD  Chief complaint: Abdominal pain, vomiting, abnormal liver enzymes   IMPRESSION:  Near constant nausea and intermittent abdominal pain x 5-6 months    - negative H. pylori breath test 12/10/2008    - previously evaluated on EGD with Dr. Deatra Ina 05/12/14    - no source identified on CT scan 03/06/19    -No source identified on EGD or colonoscopy 2/21    - essentially normal GES 06/01/19 Suspected fatty liver by labs and imaging (Fatty liver on CT)    - Elevated transaminases with an AST of 139, ALT 114    - intermittently elevated for years    - normal TTGA and IgA 03/02/19    - Elastography 06/11/19: nigh probability of being normal with no significant fibrosis New diagnosis of Insulin resistance Elevated ESR 62, normal CRP 2.6 Left-sided diverticulosis without history of diverticulitis Prior cholecystectomy 1983 Suspected endoscopic appearance of Barrett's     - esophageal biopsies negative 2016    - Esophageal biopsies negative for Barrett's 03/2019 History of esophagitis on EGD with Dr. Deatra Ina 05/12/2014 History of colon polyps    - 5 tubular adenomas removed on colonoscopy 03/2019    - surveillance recommended in 2024 No known family history of colon cancer or polyps  Abdominal pain and nausea: Mild gastritis on EGD. Gastric emptying scan is essentially normal. I do not expect the mild changes at 4 hours to be clinically significant. 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 Trying to avoid long-term antiemetics.  Trial of FDGard recommended.  Fatty liver disease by labs and imaging: Elastography makes advanced fibrosis unlikely. NASHFibroSURE for confirmation.  Recommend reviewing diagnosis of insulin resistance with primary care provider.  Will need annual reassessment given the risk for disease progression in the  setting of insulin resistance.   Diverticulosis: Continue high fiber diet, daily stool bulking agent with psyllum.   Suspected endoscopic appearance of Barrett's on prior endoscopy: Esophageal biopsies negative 2016. Normal endoscopic appearance of the esophagus and normal esophageal biopsies without evidence for Barrett's esophagus 2/21.  No further evaluation indicated at this time.  History of colon polyps: 5 tubular adenomas removed at the time of her colonoscopy last month.  We will plan surveillance colonoscopy in 3 years.    PLAN: Continue omeprazole 40 mg BID Zofran 58m q4 hours PRN (#100, no refills) NASH FibroSURE for staging of NAFLD Trial of FDGard Work to maintain a healthy weight, regular alcohol encouraged Reduce daily alcohol intake Follow-up in 8-10 weeks  I spent 45 minutes, including in depth chart review, independent review of results as outlined above, communicating results with the patient directly, face-to-face time with the patient, coordinating care, ordering studies and medications as appropriate, and documentation.   HPI: Samantha FARONis a 64y.o. female under evaluation of abdominal pain, vomiting, and abnormal liver enzymes.  Initial consultation performed 03/31/19. EGD and colonoscopy performed 04/13/19. Seen in office follow-up 05/12/19. Returns today in scheduled follow-up. The interval  history is obtained through the patient and review of her electronic health record.  She has COPD, hypertension, hypercholesterolemia, back pain. She had a cholecystectomy in 1981.   Seen by Dr. KDeatra Inain 2016 for several years of nausea exacerbated by eating with associated abdominal distention and bloating.  He suspected gastroparesis, peptic ulcer disease or nonulcer dyspepsia.  Upper endoscopy 05/12/2014 showed suspected  3 cm segment of Barrett's esophagus.  The exam was otherwise normal.  Pathology results showed chronic inflammation in the distal esophagus.  There was no  intestinal metaplasia, dysplasia or malignancy.  She was treated with omeprazole 20 mg daily.  A gastric emptying scan was recommended but not performed.  Although she identifies having intermittent GI symptoms over the years, at the time of initial consultation she reported 5-6 months of progressive, intermittent abdominal pain located below her right rib and radiating across the mid-abdomen, constant nausea, and rare vomiting relieved by eating. No change with defecation.  Lying down and fetal position of the left side helps with the nausea.  Improved on Phenergan until her prescription ran out. Symptom relief with ibuprofen 400 mg BID until she learned this may be worsening her symptoms.  Trial of omeprazole including twice daily dosing but it seemed to result in more nausea so she stopped using it.   Seen in the emergency room 12/08/2018 for 5 days of abdominal pain and treated empirically for diverticulitis with Cipro and Flagyl.  No CT scan was done at that time.  She wonders if this is due to Covid-stress, worry, and isolation. She notes being depressed.   Incidentally found to have elevated transaminases, although they have been intermittently elevated since 2015. No identified risks for chronic viral hepatitis. Drinks vodka and wine a couple times a week. Alcohol intake has increase during Covid.   Labs 03/02/2019 showed an AST of 139 and an ALT of 114.  Total bilirubin 0.7, alk phos 86, albumin 4.0, total protein 7.6.  CRP 2.6.  ESR 62.  Normal CBC with a hemoglobin of 14.1, platelets 201.  TSH normal.  TTGA and IgA normal.  Labs from 04/10/2019 show a glucose of 122, fasting insulin of 26, smooth muscle antibody less than 20, amylase 26, lipase 24, iron 112, ferritin 160, IgG 1062, IgM 162, negative ANA, negative ANA, hep B surface antigen nonreactive, hep B core total antibody nonreactive  CT of the abdomen and pelvis with contrast 03/06/2019 showed no acute findings to explain abdominal pain,  hepatic steatosis, left-sided diverticulosis, prior cholecystectomy, prior appendectomy, prior hysterectomy, and a 2.2 cm left ovarian cyst.  I personally reviewed her CT images and compared them to the CT of the abdomen and pelvis with contrast from January 2018.  Diffuse fatty infiltration of the liver is seen in 2018 as well.  Distant testing shows a negative H. pylori breath test 12/10/2008  She reports having a colonoscopy at age 63. It was performed in [place].   Colonoscopy 04/13/19 showed left-sided diverticulosis; five 1 to 3 mm tubular adenomas in the sigmoid colon, in the descending colon, at the splenic flexure and in the ascending colon.  EGD 04/13/19 showed  Z-line irregular, 39 cm from the incisors. Biopsied. Normal stomach. Biopsied. Normal examined duodenum. Biopsied.  Gastric biopsies showed mild chronic gastritis, reactive gastropathy, and no Helicobacter pylori.  Esophageal biopsies showed reflux with no evidence for Barrett's esophagus or eosinophilic esophagitis.  Duodenal biopsies were normal.  On follow-up 05/12/19 she reported worsening flatus after starting probiotics. Bowel movements are now firm and she feels semi-constipated.  Had persistent nausea. Not continuous but seems to occur most before and after meals.  Using antiemetics at bedtime to prevent nausea.   Gastric emptying scan 06/01/19: Normal gastric emptying until the 4 hour post meal showed 88% emptying        - 34.5% emptied at 1 hr ( normal >= 10%)        -  52.1% emptied at 2 hr ( normal >= 40%)        - 70.3% emptied at 3 hr ( normal >= 70%)         - 88.0% emptied at 4 hr ( normal >= 90%)     Abdominal ultrasound with elastography 06/11/19: echogenic liver, high probablity of being normal with no significant fibrosis  Returns today in scheduled follow-up. Abdominal pain is better but still present. Continues to be a sharp pain that may worsen with movement. There is associated early satiety, bloating, gas,  and nausea. Sometimes she is so nauseated she can't eat. Less constipated since taking the probiotics. Now having one formed BM each morning. No blood or mucous. Weight stable.    Past Medical History:  Diagnosis Date  . Chronic back pain    buldging disc and tumor.Spondylolisthesis  . COPD (chronic obstructive pulmonary disease) (HCC)    Symbicort daily and ProAir as needed  . Emphysema   . Emphysema of lung ([place])   . Fatty liver 04/13/2019  . Fibromyalgia   . Genital warts   . GERD (gastroesophageal reflux disease)    takes Omeprazole daily  . History of bronchitis 01/2014  . History of colon polyps   . Hyperlipidemia    has been off of meds d/t getting sick from them.Not been addressed again.   Marland Kitchen Hypertension    takes Ziac daily  . Nocturia   . Pneumonia    hx of-8+yrs ago  . PONV (postoperative nausea and vomiting)   . Shortness of breath dyspnea    thinks d/t pain meds and notices with exertion  . Stroke ([place])    16 yrs ago--left eye     Past Surgical History:  Procedure Laterality Date  . ABDOMINAL HYSTERECTOMY  1983   partial   . ADENOIDECTOMY    . ANTERIOR (CYSTOCELE) AND POSTERIOR REPAIR (RECTOCELE) WITH XENFORM GRAFT AND SACROSPINOUS FIXATION    . APPENDECTOMY  1967  . BREAST SURGERY Right    diseased milk glands  . CHOLECYSTECTOMY  1983  . COLONOSCOPY  04/13/2019  . DIAGNOSTIC LAPAROSCOPY     cyst removed from ovaries   . ESOPHAGOGASTRODUODENOSCOPY    . NECK SURGERY  [phone]   c spine ant and post hx fusion and removal or harcdware and shavings  . POLYPECTOMY    . rods and screws in lumbar    . TONSILLECTOMY    . UPPER GASTROINTESTINAL ENDOSCOPY  04/13/2019    Current Outpatient Medications  Medication Sig Dispense Refill  . albuterol (PROAIR HFA) 108 (90 Base) MCG/ACT inhaler 2 puffs every 4 hours as needed only  if your can't catch your breath 18 g 1  . albuterol (PROVENTIL) (2.5 MG/3ML) 0.083% nebulizer solution Take 3 mLs (2.5 mg total)  by nebulization every 6 (six) hours as needed for wheezing or shortness of breath. 75 mL 12  . bisoprolol-hydrochlorothiazide (ZIAC) 5-6.25 MG tablet One twice daily 180 tablet 3  . cetirizine (ZYRTEC) 10 MG tablet Take 10 mg by mouth daily.    . cloNIDine (CATAPRES) 0.1 MG tablet Take 1 tablet (0.1 mg total) by mouth 2 (two) times daily. 60 tablet 11  . gabapentin (NEURONTIN) 300 MG capsule 1 pill nightly 90 capsule 3  . Glycopyrrolate-Formoterol (BEVESPI AEROSPHERE) 9-4.8 MCG/ACT AERO Inhale 2 puffs into the lungs 2 (two) times a day. 4 g 0  . Multiple Vitamins-Minerals (CENTRUM PO) Take 1 tablet by mouth daily.     Marland Kitchen omeprazole (PRILOSEC)  40 MG capsule Take 1 capsule (40 mg total) by mouth 2 (two) times daily. 60 capsule 3  . ondansetron (ZOFRAN ODT) 4 MG disintegrating tablet Take 1 tablet (4 mg total) by mouth every 8 (eight) hours as needed for nausea or vomiting. 100 tablet 0  . ondansetron (ZOFRAN) 4 MG tablet Take 1 tablet (4 mg total) by mouth every 8 (eight) hours as needed for nausea or vomiting. 100 tablet 0  . venlafaxine XR (EFFEXOR XR) 75 MG 24 hr capsule Take 1 capsule (75 mg total) by mouth daily with breakfast. 90 capsule 3   No current facility-administered medications for this visit.    Allergies as of 06/16/2019 - Review Complete 06/16/2019  Allergen Reaction Noted  . Wellbutrin [bupropion] Nausea And Vomiting 02/17/2012    Family History  Problem Relation Age of Onset  . CAD Father        tumor between heart and lung   . Hypertension Father   . CAD Sister        ? dx   . Hypertension Mother   . Lung cancer Mother        died 53   . Diabetes Maternal Grandmother        late 33s   . Colon cancer Neg Hx   . Colon polyps Neg Hx   . Esophageal cancer Neg Hx   . Rectal cancer Neg Hx   . Stomach cancer Neg Hx     Social History   Socioeconomic History  . Marital status: Widowed    Spouse name: Not on file  . Number of children: 2  . Years of education: Not  on file  . Highest education level: Not on file  Occupational History  . Occupation: retired    Fish farm manager: [name]  Tobacco Use  . Smoking status: Former Smoker    Packs/day: 0.25    Years: 44.00    Pack years: 11.00    Types: Cigarettes    Quit date: 02/04/2014    Years since quitting: 5.3  . Smokeless tobacco: Never Used  . Tobacco comment: quit smoking in Dec 2015  Substance and Sexual Activity  . Alcohol use: Yes    Alcohol/week: 4.0 standard drinks    Types: 4 Standard drinks or equivalent per week    Comment: couple times a week  . Drug use: No  . Sexual activity: Not on file  Other Topics Concern  . Not on file  Social History Narrative   6-8 hours of sleep per night   Lives with her fiance hh of 2 no pets    1 dog in the home   Retired no ets  But tob 8 per day   etoh ocass 1-2    On disability from her neck surgery predicaments .   orig from [employer] in [place] area.    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 2   Last Pap 2006 last mammogram 2000 and   Has dentures   FA    Social Determinants of Health   Financial Resource Strain:   . Difficulty of Paying Living Expenses:   Food Insecurity:   . Worried About Charity fundraiser in the Last Year:   . Arboriculturist in the Last Year:   Transportation Needs:   . Film/video editor (Medical):   Marland Kitchen Lack of Transportation (Non-Medical):   Physical Activity:   . Days of Exercise per Week:   .  Minutes of Exercise per Session:   Stress:   . Feeling of Stress :   Social Connections:   . Frequency of Communication with Friends and Family:   . Frequency of Social Gatherings with Friends and Family:   . Attends Religious Services:   . Active Member of Clubs or Organizations:   . Attends Archivist Meetings:   Marland Kitchen Marital Status:   Intimate Partner Violence:   . Fear of Current or Ex-Partner:   . Emotionally Abused:   Marland Kitchen Physically Abused:   . Sexually Abused:      Physical Exam: Gen: Awake, alert, and oriented, and well communicative. HEENT: EOMI, non-icteric sclera, NCAT, MMM  Neck: Normal movement of head and neck  Derm: No apparent lesions or bruising in visible field  MS: Moves all visible extremities without noticeable abnormality  Psych: Pleasant, cooperative, normal speech, thought processing seemingly intact     Samantha Sills L. Tarri Glenn, MD, MPH 06/16/2019, 10:28 AM

## 2019-06-16 NOTE — Patient Instructions (Addendum)
Continue to take your medications including: - omeparzole 40 mg twice daily - Zofran as needed for nausea  We have sent the following medications to your pharmacy for you to pick up at your convenience: Omeprazole Zofran  I am recommending a trial of FDGard - 2 capsules taken twice daily for 4 weeks.  Please take FDGard 30 to 60 minutes before meals with water.  There is no distinct pattern of side effects with FDGard, but, sometimes patients experience a mild tingling sensation in the gut within the first 30 minutes. This sensation should subsequently subside. You have been given samples.  Working to maintain and healthy weight and avoiding alcohol may ultimately help your gut be healthy and minimize some of your symptoms.   Please stop by the lab to have the test called a FibroSURE to follow-up on your fatty liver. Your provider has requested that you go to the basement level for lab work before leaving today. Press "B" on the elevator. The lab is located at the first door on the left as you exit the elevator.  Let's plan to see each other in 8-10 weeks, or earlier as needed. You have been scheduled for 08/07/19 at 10:10 am  Thank you for trusting me with your gastrointestinal care!    Thornton Park, MD, MPH  If you are age 57 or older, your body mass index should be between 23-30. Your Body mass index is 36.41 kg/m. If this is out of the aforementioned range listed, please consider follow up with your Primary Care Provider.  If you are age 14 or younger, your body mass index should be between 19-25. Your Body mass index is 36.41 kg/m. If this is out of the aformentioned range listed, please consider follow up with your Primary Care Provider.

## 2019-06-23 LAB — NASH FIBROSURE
ALPHA 2-MACROGLOBULINS, QN: 244 mg/dL (ref 110–276)
ALT (SGPT) P5P: 123 IU/L — ABNORMAL HIGH (ref 0–40)
AST (SGOT) P5P: 219 IU/L — ABNORMAL HIGH (ref 0–40)
Apolipoprotein A-1: 137 mg/dL (ref 116–209)
Bilirubin, Total: 0.3 mg/dL (ref 0.0–1.2)
Cholesterol, Total: 273 mg/dL — ABNORMAL HIGH (ref 100–199)
Fibrosis Score: 0.37 — ABNORMAL HIGH (ref 0.00–0.21)
GGT: 210 IU/L — ABNORMAL HIGH (ref 0–60)
Glucose: 103 mg/dL — ABNORMAL HIGH (ref 65–99)
Haptoglobin: 230 mg/dL (ref 37–355)
Height: 66 in
NASH Score: 0.5 — ABNORMAL HIGH
Steatosis Score: 0.94 — ABNORMAL HIGH (ref 0.00–0.30)
Triglycerides: 283 mg/dL — ABNORMAL HIGH (ref 0–149)
Weight: 225 [lb_av]

## 2019-06-29 ENCOUNTER — Ambulatory Visit: Payer: Medicare Other | Admitting: Internal Medicine

## 2019-06-29 ENCOUNTER — Other Ambulatory Visit: Payer: Self-pay

## 2019-06-29 ENCOUNTER — Encounter: Payer: Self-pay | Admitting: Internal Medicine

## 2019-06-29 DIAGNOSIS — R0609 Other forms of dyspnea: Secondary | ICD-10-CM

## 2019-06-29 DIAGNOSIS — J449 Chronic obstructive pulmonary disease, unspecified: Secondary | ICD-10-CM | POA: Diagnosis not present

## 2019-06-29 DIAGNOSIS — R0600 Dyspnea, unspecified: Secondary | ICD-10-CM | POA: Diagnosis not present

## 2019-06-29 MED ORDER — BREZTRI AEROSPHERE 160-9-4.8 MCG/ACT IN AERO
2.0000 | INHALATION_SPRAY | Freq: Two times a day (BID) | RESPIRATORY_TRACT | 11 refills | Status: DC
Start: 2019-06-29 — End: 2020-05-03

## 2019-06-29 MED ORDER — BREZTRI AEROSPHERE 160-9-4.8 MCG/ACT IN AERO: 2.0000 | INHALATION_SPRAY | Freq: Two times a day (BID) | RESPIRATORY_TRACT | 0 refills | Status: DC

## 2019-06-29 MED ORDER — PREDNISONE 10 MG PO TABS
ORAL_TABLET | ORAL | 0 refills | Status: DC
Start: 2019-06-29 — End: 2019-08-07

## 2019-06-29 NOTE — Patient Instructions (Addendum)
Prednisone 10 mg take  4 each am x 2 days,   2 each am x 2 days,  1 each am x 2 days and stop   Change bevespi to brezri Take 2 puffs first thing in am and then another 2 puffs about 12 hours later.   Work on inhaler technique:  relax and gently blow all the way out then take a nice smooth deep breath back in, triggering the inhaler at same time you start breathing in.  Hold for up to 5 seconds if you can. Blow out thru nose. Rinse and gargle with water when done - use arm and hammer toothpaste and brush teeth tongue and make a slurry and garlge   Please remember to go to the lab and x-ray department   for your tests - we will call you with the results when they are available.     Please schedule a follow up office visit in 4 weeks, sooner if needed

## 2019-06-29 NOTE — Progress Notes (Signed)
Subjective:    Patient ID: Samantha Hinton, female    DOB: Jan 27, 1956 MRN: [id number]    Brief patient profile:  22 yowf MM quit smoking 01/2014  with GOLD II COPD  baseline doe x best days able to do extensive shopping s 02 better with albuterol when had access to it then admitted [facility]:   Admit Date: 02/17/2012  PRIMARY DISCHARGE DIAGNOSIS:  URI (upper respiratory infection)  COPD exacerbation  SOB (shortness of breath)  Hypoxemia  UTI (lower urinary tract infection)  Diarrhea  Tobacco abuse    04/10/2012 1st pulmonary ov  On ACEI cc doe x 5 steps on neb and albuterol 7 x daily with persistent sense of chest tightness no better since discharge rec Stop lisinopril and start bystolic 5 mg one daily Start dulera 100 Take 2 puffs first thing in am and then another 2 puffs about 12 hours later.  Plan B  Only use your albuterol inhaler, plan C is your nebulizer) as a rescue medication   GERD diet   05/08/2012 f/u ov/Brilee Port cc breathing much better on dulera [id number] and bystolic no need for saba or neb despite head and chest cold since last ov. rec Start bisoprolol 5 mg daily in place of bystolic - break in half daily if too strong dulera 200 Take 2 puffs first thing in am and then another 2 puffs about 12 hours later.  06/12/2012 f/u ov/Gowri Suchan re copd Chief Complaint  Patient presents with  . Follow-up    Breathing some worse for the past 2 wks, relates to allergy season. Cough has improve some.    takes zyrtec year round, very nervous using saba 1-2 x daily but typically not at rest or hs.  Minimal impact on activity tolerance though. rec Zyrtec should be used one daily as needed for itching sneezing runny nose Blow out through through the nose Please see patient coordinator before you leave today  to schedule pulmonary rehab > placed on wt list  Prednisone 10 mg take  4 each am x 2 days,   2 each am x 2 days,  1 each am x2days and stop   08/01/2012 f/u ov/Pairlee Sawtell still smoking Chief  Complaint  Patient presents with  . Follow-up    Pt states that her breathing is unchanged since last visit. Cough is some better.   still using hfa albuterol before  And after dulera but  not needing nebulizer alb and not limited from desired activities - still on wt list for rehab. Not convinced dulera really helping her or reducing perceived need for saba and no real change since ran out x > 2 weeks prior to OV   rec tudorza one twice daily on a trial basis Only use your albuterol (ventolin 1st, nebulizer is second)     08/13/2014 f/u ov/Niyam Bisping re: GOLD II copd/ quit smoking Dec 2015 / not using symb regularly  Chief Complaint  Patient presents with  . Follow-up    Pt here for medication refill. She states her breathing hsa been doing well. She uses albuterol inhaler 1 x daily on average. She rarely uses nebs.   no real change off symbicort up to a month at a time  Only sob overdoes/ sleeps fine  rec Ok to change symbicort to take  as needed Take 2 puffs first thing in am and then another 2 puffs about 12 hours later.  Only use your albuterol as a rescue medication     08/11/2018 acute extended ov/Nechemia Chiappetta  re:  Re-establish GOLD II copd/ worse sob still on symb 160 2bid  Chief Complaint  Patient presents with  . Pulmonary Consult    Self referral. Pt c/o increased SOB since Spring 2020. She gets winded walking room to room at home.   MMRC3 = can't walk 100 yards even at a slow pace at a flat grade s stopping due to sob  Progressive worse no variability since December 2019 Hs = gurgly on back / overt hb on just prilosec 20 mg not ac No cough  Mild leg swelling   Neb but never before ex  rec Plan A = Automatic = Bevespi Take 2 puffs first thing in am and then another 2 puffs about 12 hours later.  Work on inhaler technique:   Plan B = Backup Only use your albuterol inhaler as a rescue medication to be used if you can't catch your breath Plan C = Crisis - only use your albuterol  nebulizer if you first try Plan B and it fails to help > ok to use the nebulizer up to every 4 hours but if start needing it regularly call for immediate appointment Omeprazole 40 mg Take 30- 60 min before your first and last meals of the day  GERD diet    09/10/2018  f/u ov/Chiante Peden re:  GOLD II maint on bevespi 2 bid improved  Chief Complaint  Patient presents with  . Follow-up    Breathing has improved some. She does not have a rescue inhaler.    Dyspnea:  MMRC2 = can't walk a nl pace on a flat grade s sob but does fine slow and flat  Cough: none  Sleeping: ok flat/ 1-2 pillows  SABA use: none 02: none  rec Plan A = Automatic = Bevespi Take 2 puffs first thing in am and then another 2 puffs about 12 hours later.  Increase ziac to one twice daily  Plan B = Backup Only use your albuterol inhaler as a rescue medication Plan C = Crisis - only use your albuterol nebulizer if you first try Plan B and it fails to help > ok to use the nebulizer up to every 4 hours but if start needing it regularly call for immediate appointment Please schedule a follow up visit in 3 months but call sooner if needed     12/29/2018  f/u ov/Durward Matranga re: GOLD II/ bevespi Chief Complaint  Patient presents with  . Follow-up    Breathing is some better since the last visit. She is using her albuterol inhaler about 2 x per day.   Dyspnea:  Limited by back / MMRC2 = can't walk a nl pace on a flat grade s sob but does fine slow and flat   Cough: minimal / none noct  Sleeping: ok flat/ 1-2 pillows  SABA use: as above  02: none  Nausea and abd pain w/u in UC dx diverticulitis some better p cipro/flagyl but still needing phenergan and pain only 50% better > has not had f/u  rec Start clonidine 0.1 mg twice daily  - can make mouth dry but should help your back pain (use jolly ranchers)  Make appt to see Dr Regis Bill about your abdominal pain     06/29/2019  f/u ov/Lanijah Warzecha re: GOLD II / bevespi  Both shots for covid 19  completed Chief Complaint  Patient presents with  . Follow-up    6 month f/u for COPD. States her breathing has gotten worse over the past 2  weeks. Increased SOB with exertion.   Dyspnea:  At onset of spring weather noted gradual reduction in ex tol, no better with nebs /assoc with subj wheeze Cough: none/ sneezing water eyes some pnds but no real cough Sleeping: feels choking on back x years, better on side/on 2 big pillows  SABA use: no better with inhaler 02: says sats drops  when walking into 80s (stopped prior to desats with walking study on day of ov)   Assoc overt HB  No obvious day to day or daytime variability or assoc excess/ purulent sputum or mucus plugs or hemoptysis or cp or chest tightness,  or overt sinus   symptoms.   sleeping  without nocturnal  or early am exacerbation  of respiratory  c/o's or need for noct saba. Also denies any obvious fluctuation of symptoms with weather or environmental changes or other aggravating or alleviating factors except as outlined above   No unusual exposure hx or h/o childhood pna/ asthma or knowledge of premature birth.  Current Allergies, Complete Past Medical History, Past Surgical History, Family History, and Social History were reviewed in [employer] record.  ROS  The following are not active complaints unless bolded Hoarseness, sore throat, dysphagia, dental problems, itching, sneezing,  nasal congestion or discharge of excess mucus or purulent secretions, ear ache,   fever, chills, sweats, unintended wt loss or wt gain, classically pleuritic or exertional cp,  orthopnea pnd or arm/hand swelling  or leg swelling, presyncope, palpitations, abdominal pain, anorexia, nausea, vomiting, diarrhea  or change in bowel habits or change in bladder habits, change in stools or change in urine, dysuria, hematuria,  rash, arthralgias, visual complaints, headache, numbness, weakness or ataxia or problems with walking or  coordination,  change in mood= anxious or  memory.        Current Meds  Medication Sig  . albuterol (PROAIR HFA) 108 (90 Base) MCG/ACT inhaler 2 puffs every 4 hours as needed only  if your can't catch your breath  . albuterol (PROVENTIL) (2.5 MG/3ML) 0.083% nebulizer solution Take 3 mLs (2.5 mg total) by nebulization every 6 (six) hours as needed for wheezing or shortness of breath.  . bisoprolol-hydrochlorothiazide (ZIAC) 5-6.25 MG tablet One twice daily  . cetirizine (ZYRTEC) 10 MG tablet Take 10 mg by mouth daily.  . cloNIDine (CATAPRES) 0.1 MG tablet Take 1 tablet (0.1 mg total) by mouth 2 (two) times daily.  Marland Kitchen gabapentin (NEURONTIN) 300 MG capsule 1 pill nightly  . Glycopyrrolate-Formoterol (BEVESPI AEROSPHERE) 9-4.8 MCG/ACT AERO Inhale 2 puffs into the lungs 2 (two) times a day.  . Multiple Vitamins-Minerals (CENTRUM PO) Take 1 tablet by mouth daily.   Marland Kitchen omeprazole (PRILOSEC) 40 MG capsule Take 1 capsule (40 mg total) by mouth 2 (two) times daily.  . ondansetron (ZOFRAN ODT) 4 MG disintegrating tablet Take 1 tablet (4 mg total) by mouth every 8 (eight) hours as needed for nausea or vomiting.  . ondansetron (ZOFRAN) 4 MG tablet Take 1 tablet (4 mg total) by mouth every 4 (four) hours.  Marland Kitchen venlafaxine XR (EFFEXOR XR) 75 MG 24 hr capsule Take 1 capsule (75 mg total) by mouth daily with breakfast.                       Objective:   Physical Exam  06/29/2019  224  12/29/2018  226  09/10/2018  227  08/11/2018  226  05/08/2012  Wt 195  > 199 06/12/2012 > 201 08/01/2012 >  09/23/2012  204 > 06/19/2013  199 > 07/01/2013  195 > 08/13/2014 183     04/10/12 194 lb 9.6 oz (88.27 kg)  02/20/12 190 lb 12.8 oz (86.546 kg)    Reports no uppper/ 4 lower teeth  Vital signs reviewed  06/29/2019  - Note at rest 02 sats  95% on RA       HEENT : pt wearing mask not removed for exam due to covid - 19 concerns.    NECK :  without JVD/Nodes/TM/ nl carotid upstrokes bilaterally   LUNGS: no acc muscle  use,  Mild barrel  contour chest wall with bilateral  Distant bs s audible wheeze and  without cough on insp or exp maneuvers  and mild  Hyperresonant  to  percussion bilaterally     CV:  RRR  no s3 or murmur or increase in P2, and no edema   ABD: obese soft and nontender with pos end  insp Hoover's  in the supine position. No bruits or organomegaly appreciated, bowel sounds nl  MS:   Nl gait/  ext warm without deformities, calf tenderness, cyanosis or clubbing No obvious joint restrictions   SKIN: warm and dry without lesions    NEURO:  alert, approp, nl sensorium with  no motor or cerebellar deficits apparent.    CXR PA and Lateral:   06/29/2019 :    I personally reviewed images and agree with radiology impression as follows:    >>> did not go for cxr   Labs: did not go for labs                 Assessment & Plan:

## 2019-06-30 ENCOUNTER — Telehealth: Payer: Self-pay | Admitting: *Deleted

## 2019-06-30 ENCOUNTER — Other Ambulatory Visit (INDEPENDENT_AMBULATORY_CARE_PROVIDER_SITE_OTHER): Payer: Medicare Other

## 2019-06-30 ENCOUNTER — Encounter: Payer: Self-pay | Admitting: Internal Medicine

## 2019-06-30 ENCOUNTER — Ambulatory Visit (INDEPENDENT_AMBULATORY_CARE_PROVIDER_SITE_OTHER)
Admission: RE | Admit: 2019-06-30 | Discharge: 2019-06-30 | Disposition: A | Discharge status: Home / Self Care | Payer: Medicare Other | Source: Ambulatory Visit | Attending: Internal Medicine | Admitting: Internal Medicine

## 2019-06-30 DIAGNOSIS — R0609 Other forms of dyspnea: Secondary | ICD-10-CM

## 2019-06-30 DIAGNOSIS — R0600 Dyspnea, unspecified: Secondary | ICD-10-CM

## 2019-06-30 DIAGNOSIS — R0602 Shortness of breath: Secondary | ICD-10-CM | POA: Diagnosis not present

## 2019-06-30 LAB — CBC WITH DIFFERENTIAL/PLATELET
Basophils Absolute: 0 10*3/uL (ref 0.0–0.1)
Basophils Relative: 0.7 % (ref 0.0–3.0)
Eosinophils Absolute: 0.2 10*3/uL (ref 0.0–0.7)
Eosinophils Relative: 2.8 % (ref 0.0–5.0)
HCT: 42.5 % (ref 36.0–46.0)
Hemoglobin: 14 g/dL (ref 12.0–15.0)
Lymphocytes Relative: 27.9 % (ref 12.0–46.0)
Lymphs Abs: 1.9 10*3/uL (ref 0.7–4.0)
MCHC: 33 g/dL (ref 30.0–36.0)
MCV: 89.6 fl (ref 78.0–100.0)
Monocytes Absolute: 0.4 10*3/uL (ref 0.1–1.0)
Monocytes Relative: 5.7 % (ref 3.0–12.0)
Neutro Abs: 4.2 10*3/uL (ref 1.4–7.7)
Neutrophils Relative %: 62.9 % (ref 43.0–77.0)
Platelets: 181 10*3/uL (ref 150.0–400.0)
RBC: 4.74 Mil/uL (ref 3.87–5.11)
RDW: 15.2 % (ref 11.5–15.5)
WBC: 6.6 10*3/uL (ref 4.0–10.5)

## 2019-06-30 LAB — BASIC METABOLIC PANEL
BUN: 14 mg/dL (ref 6–23)
CO2: 28 mEq/L (ref 19–32)
Calcium: 9 mg/dL (ref 8.4–10.5)
Chloride: 96 mEq/L (ref 96–112)
Creatinine, Ser: 0.86 mg/dL (ref 0.40–1.20)
GFR: 66.51 mL/min (ref >60.00)
Glucose, Bld: 127 mg/dL — ABNORMAL HIGH (ref 70–99)
Potassium: 3.9 mEq/L (ref 3.5–5.1)
Sodium: 137 mEq/L (ref 135–145)

## 2019-06-30 LAB — BRAIN NATRIURETIC PEPTIDE: Pro B Natriuretic peptide (BNP): 32 pg/mL (ref 0.0–100.0)

## 2019-06-30 LAB — TSH: TSH: 1.64 u[IU]/mL (ref 0.35–4.50)

## 2019-06-30 NOTE — Assessment & Plan Note (Signed)
Onset early spring 2020 assoc with wt gain - 08/11/2018   Walked RA x one lap =  approx 250 ft - stopped due to  Sob, leg and  back pain sats still 96% @ avg pace  - 06/29/2019   Walked on RA x one lap =  approx 250 ft -@ slow pace stopped due to sob, lowest sat 91%     Did not complete the w/u for unexplained sob, rec she do so esp if not responding to rx as mild aecopd.         Each maintenance medication was reviewed in detail including emphasizing most importantly the difference between maintenance and prns and under what circumstances the prns are to be triggered using an action plan format where appropriate.  Total time for H and P, chart review, counseling, teaching device/  directly observing portions of ambulatory 02 saturation study/ and generating customized AVS unique to this office visit / charting = 30 min

## 2019-06-30 NOTE — Assessment & Plan Note (Signed)
Quit smoking 01/2014   - PFT's 06/12/2012 FEV1  1.41 (60%) ratio 64 and no better p B2  And DLCO 51 corrects to 94 % - 06/12/2012 refer to Rehab - 08/02/2012 started tudorza > improved - 06/19/2013  >  Started symbicort 2 bid  - 08/11/2018    change symb to bevespi 2 bid And max gerd rx   Alpha one AT screen 08/11/2018  MM  Level  145  - 09/10/2018   continue bevespi - 06/29/2019  After extensive coaching inhaler device,  effectiveness =   90% change to breztri/ Prednisone 10 mg take  4 each am x 2 days,   2 each am x 2 days,  1 each am x 2 days and stop   She did not complete the w/u as req but most likely  Group D in terms of symptom/risk and laba/lama/ICS  therefore appropriate rx at this point >>>  breztri trial with pred x 6 days and definitely needs to return to complete the w/u if not improving  Advised: I spent extra time with pt today reviewing appropriate use of albuterol for prn use on exertion with the following points: 1) saba is for relief of sob that does not improve by walking a slower pace or resting but rather if the pt does not improve after trying this first. 2) If the pt is convinced, as many are, that saba helps recover from activity faster then it's easy to tell if this is the case by re-challenging : ie stop, take the inhaler, then p 5 minutes try the exact same activity (intensity of workload) that just caused the symptoms and see if they are substantially diminished or not after saba 3) if there is an activity that reproducibly causes the symptoms, try the saba 15 min before the activity on alternate days   If in fact the saba really does help, then fine to continue to use it prn but advised may need to look closer at the maintenance regimen being used to achieve better control of airways disease with exertion.

## 2019-06-30 NOTE — Telephone Encounter (Signed)
-----   Message from Tanda Rockers, MD sent at 06/30/2019  6:33 AM EDT ----- Did not go for labs or cxr as rec > have her return and if declines change to phone note

## 2019-07-01 LAB — D-DIMER, QUANTITATIVE: D-Dimer, Quant: 0.55 mcg/mL FEU — ABNORMAL HIGH (ref <0.50)

## 2019-07-01 LAB — IGE: IgE (Immunoglobulin E), Serum: 9 kU/L (ref <=114)

## 2019-07-01 NOTE — Progress Notes (Signed)
Spoke with pt and notified of results per Dr. Wert. Pt verbalized understanding and denied any questions. 

## 2019-07-01 NOTE — Progress Notes (Signed)
LMTCB

## 2019-07-01 NOTE — Progress Notes (Signed)
lmtcb

## 2019-07-13 ENCOUNTER — Other Ambulatory Visit: Payer: Self-pay | Admitting: Gastroenterology

## 2019-08-03 ENCOUNTER — Other Ambulatory Visit: Payer: Self-pay

## 2019-08-03 NOTE — Progress Notes (Signed)
Chief Complaint  Patient presents with  . Toe Pain    swollen and red, left long toe, numb and painful    HPI: Samantha Hinton 64 y.o. come in for  New problem  Onset about 10 days ago osf serious swelling and pain  Left second toe  Toe has lateral numbness since her back surgery  5 years ago  But no trauma inciter is obvious .  Has had nodule like  Changes before swelling and is less today than 2 days ago  But ongoing pain and warmth aand redness no  Hx of same   Noted to have fatty liver insulin resistance  And fatty liver  Hepatic steatosis    Fibro sure .  Sees dr  Melvyn Novas for asthma copd  And breathing is issues  Waiting on  Inhaler med using ventolin I interim   Had 10 day course of pred in May for resp sx that helped .  But now still sob   Bp is usually  In 140 /80 range up today after  walking and doe .  Ovarian cyst   Left never got followed up   Send in paper work but no fu ?    ROS: See pertinent positives and negatives per HPI. No fever falling other joint swelling  hh of 2  Past Medical History:  Diagnosis Date  . Chronic back pain    buldging disc and tumor.Spondylolisthesis  . COPD (chronic obstructive pulmonary disease) (HCC)    Symbicort daily and ProAir as needed  . Emphysema   . Emphysema of lung ([place])   . Fatty liver 04/13/2019  . Fibromyalgia   . Genital warts   . GERD (gastroesophageal reflux disease)    takes Omeprazole daily  . History of bronchitis 01/2014  . History of colon polyps   . Hyperlipidemia    has been off of meds d/t getting sick from them.Not been addressed again.   Marland Kitchen Hypertension    takes Ziac daily  . Nocturia   . Pneumonia    hx of-8+yrs ago  . PONV (postoperative nausea and vomiting)   . Shortness of breath dyspnea    thinks d/t pain meds and notices with exertion  . Stroke [facility])    16 yrs ago--left eye     Family History  Problem Relation Age of Onset  . CAD Father        tumor between heart and lung   . Hypertension  Father   . CAD Sister        ? dx   . Hypertension Mother   . Lung cancer Mother        died 6   . Diabetes Maternal Grandmother        late 33s   . Colon cancer Neg Hx   . Colon polyps Neg Hx   . Esophageal cancer Neg Hx   . Rectal cancer Neg Hx   . Stomach cancer Neg Hx     Social History   Socioeconomic History  . Marital status: Widowed    Spouse name: Not on file  . Number of children: 2  . Years of education: Not on file  . Highest education level: Not on file  Occupational History  . Occupation: retired    Fish farm manager: [name]  Tobacco Use  . Smoking status: Former Smoker    Packs/day: 0.25    Years: 44.00    Pack years: 11.00    Types: Cigarettes  Quit date: 02/04/2014    Years since quitting: 5.4  . Smokeless tobacco: Never Used  . Tobacco comment: quit smoking in Dec 2015  Vaping Use  . Vaping Use: Former  Substance and Sexual Activity  . Alcohol use: Yes    Alcohol/week: 4.0 standard drinks    Types: 4 Standard drinks or equivalent per week    Comment: couple times a week  . Drug use: No  . Sexual activity: Not on file  Other Topics Concern  . Not on file  Social History Narrative   6-8 hours of sleep per night   Lives with her fiance hh of 2 no pets    1 dog in the home   Retired no ets  But tob 8 per day   etoh ocass 1-2    On disability from her neck surgery predicaments .   orig from [employer] in [place] area.    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 2   Last Pap 2006 last mammogram 2000 and   Has dentures   FA    Social Determinants of Health   Financial Resource Strain:   . Difficulty of Paying Living Expenses:   Food Insecurity:   . Worried About Charity fundraiser in the Last Year:   . Arboriculturist in the Last Year:   Transportation Needs:   . Film/video editor (Medical):   Marland Kitchen Lack of Transportation (Non-Medical):   Physical Activity:   . Days of Exercise per Week:   . Minutes of Exercise per  Session:   Stress:   . Feeling of Stress :   Social Connections:   . Frequency of Communication with Friends and Family:   . Frequency of Social Gatherings with Friends and Family:   . Attends Religious Services:   . Active Member of Clubs or Organizations:   . Attends Archivist Meetings:   Marland Kitchen Marital Status:     Outpatient Medications Prior to Visit  Medication Sig Dispense Refill  . albuterol (PROAIR HFA) 108 (90 Base) MCG/ACT inhaler 2 puffs every 4 hours as needed only  if your can't catch your breath 18 g 1  . albuterol (PROVENTIL) (2.5 MG/3ML) 0.083% nebulizer solution Take 3 mLs (2.5 mg total) by nebulization every 6 (six) hours as needed for wheezing or shortness of breath. 75 mL 12  . bisoprolol-hydrochlorothiazide (ZIAC) 5-6.25 MG tablet One twice daily 180 tablet 3  . Budeson-Glycopyrrol-Formoterol (BREZTRI AEROSPHERE) 160-9-4.8 MCG/ACT AERO Inhale 2 puffs into the lungs 2 (two) times daily. 10.7 g 11  . cetirizine (ZYRTEC) 10 MG tablet Take 10 mg by mouth daily.    . cloNIDine (CATAPRES) 0.1 MG tablet Take 1 tablet (0.1 mg total) by mouth 2 (two) times daily. 60 tablet 11  . gabapentin (NEURONTIN) 300 MG capsule 1 pill nightly 90 capsule 3  . Multiple Vitamins-Minerals (CENTRUM PO) Take 1 tablet by mouth daily.     Marland Kitchen omeprazole (PRILOSEC) 40 MG capsule Take 1 capsule (40 mg total) by mouth 2 (two) times daily. 60 capsule 3  . ondansetron (ZOFRAN ODT) 4 MG disintegrating tablet Take 1 tablet (4 mg total) by mouth every 8 (eight) hours as needed for nausea or vomiting. 100 tablet 0  . ondansetron (ZOFRAN) 4 MG tablet Take 1 tablet (4 mg total) by mouth every 4 (four) hours. 100 tablet 0  . venlafaxine XR (EFFEXOR XR) 75 MG 24 hr capsule Take 1 capsule (  75 mg total) by mouth daily with breakfast. 90 capsule 3  . predniSONE (DELTASONE) 10 MG tablet Take  4 each am x 2 days,   2 each am x 2 days,  1 each am x 2 days and stop (Patient not taking: Reported on 08/04/2019) 14  tablet 0   No facility-administered medications prior to visit.     EXAM:  BP (!) 146/80 (BP Location: Right Arm)   Pulse 88   Temp (!) 97.3 F (36.3 C) (Temporal)   Ht 5\' 6"  (1.676 m)   Wt 225 lb 3.2 oz (102.2 kg)   SpO2 95%   BMI 36.35 kg/m   Body mass index is 36.35 kg/m.  GENERAL: vitals reviewed and listed above, alert, oriented, appears well hydrated and in no acute distress  Had doe on walking to room wearing flip flops  HEENT: atraumatic, conjunctiva  clear, no obvious abnormalities on inspection of external nose and ears OP : masked NECK: no obvious masses on inspection palpation  LUNGS:   ocass wheezing  Dec air movement  CV: HRRR, no clubbing cyanosis  Nl cap refill trc edema  MS: moves all extremities  Left foot  Second toe 3+ swollen uneven with almost nodular are  No fluctuance  Some cracking in between toes no blisters . Sole of foot hjas some small dark spots ? Warts  Vs  Extraneous dirt   non tender   PSYCH: pleasant and cooperative, no obvious depression or anxiety Lab Results  Component Value Date   WBC 8.5 08/04/2019   HGB 13.7 08/04/2019   HCT 41.8 08/04/2019   PLT 207.0 08/04/2019   GLUCOSE 127 (H) 06/30/2019   CHOL 273 (H) 06/16/2019   TRIG 283 (H) 06/16/2019   HDL 57.20 08/12/2017   LDLDIRECT 152.0 08/12/2017   LDLCALC 116 (H) 05/24/2014   ALT 77 (H) 08/04/2019   AST 89 (H) 08/04/2019   NA 137 06/30/2019   K 3.9 06/30/2019   CL 96 06/30/2019   CREATININE 0.86 06/30/2019   BUN 14 06/30/2019   CO2 28 06/30/2019   TSH 1.64 06/30/2019   HGBA1C 6.9 (H) 08/04/2019   BP Readings from Last 3 Encounters:  08/04/19 (!) 146/80  06/29/19 126/84  06/16/19 (!) 146/88   Xray  Suspicious for  Infection  And pathologic fx  ASSESSMENT AND PLAN:  Discussed the following assessment and plan:  Pathological fracture of phalanx of toe of left foot, unspecified pathological cause, possible osteomyelitis - suspscious for  osteomyelitis - Plan: AMB referral  to orthopedics  Pain and swelling of toe of left foot - Plan: DG Toe 2nd Left, Hemoglobin A1c, CBC with Differential/Platelet, Uric acid, Hepatic function panel, C-reactive protein, Sedimentation rate, AMB referral to orthopedics  Medication management - Plan: Hemoglobin A1c, CBC with Differential/Platelet, Uric acid, Hepatic function panel, C-reactive protein, Sedimentation rate  Essential hypertension, benign - Plan: Hemoglobin A1c, CBC with Differential/Platelet, Uric acid, Hepatic function panel, C-reactive protein, Sedimentation rate  Hyperglycemia - Plan: Hemoglobin A1c, CBC with Differential/Platelet, Uric acid, Hepatic function panel, C-reactive protein, Sedimentation rate  Cyst of ovary, unspecified laterality left  - never had follo wup - Plan: Ambulatory referral to Gynecology  COPD GOLD II  Hepatic steatosis Uncertain if infection vs inflammatory joint such as gout .   At risk for both      Prediabetes by hx  hepatic steatosis    ovarian cyst   Ht rx by dr wert and copd and dyspnea  With some wehezing  today   Had pred rx in May   Says helps lungs   Will  Work on corralling the multiple issues  Encouraged to lose weight and cut out processed carbs  May add metformin as indicated  -Patient advised to return or notify health care team  if  new concerns arise.  Patient Instructions  Get x ray and blood test today    Then will decide on  Treatment  Infection vs  Gout .  Blood test also  will help.   consider adding   Medication  For  blood sugar   Elevation   Will re refer  To gyne  For ovarian cyst .     Standley Brooking. Irvine Glorioso M.D.

## 2019-08-04 ENCOUNTER — Encounter: Payer: Self-pay | Admitting: Internal Medicine

## 2019-08-04 ENCOUNTER — Other Ambulatory Visit (INDEPENDENT_AMBULATORY_CARE_PROVIDER_SITE_OTHER): Payer: Medicare Other

## 2019-08-04 ENCOUNTER — Ambulatory Visit (INDEPENDENT_AMBULATORY_CARE_PROVIDER_SITE_OTHER): Payer: Medicare Other | Admitting: Internal Medicine

## 2019-08-04 ENCOUNTER — Ambulatory Visit (INDEPENDENT_AMBULATORY_CARE_PROVIDER_SITE_OTHER)
Admission: RE | Admit: 2019-08-04 | Discharge: 2019-08-04 | Disposition: A | Discharge status: Home / Self Care | Payer: Medicare Other | Source: Ambulatory Visit | Attending: Internal Medicine | Admitting: Internal Medicine

## 2019-08-04 ENCOUNTER — Other Ambulatory Visit: Payer: Self-pay

## 2019-08-04 VITALS — BP 146/80 | HR 88 | Temp 97.3°F | Ht 66.0 in | Wt 225.2 lb

## 2019-08-04 DIAGNOSIS — M79675 Pain in left toe(s): Secondary | ICD-10-CM

## 2019-08-04 DIAGNOSIS — M84478A Pathological fracture, left toe(s), initial encounter for fracture: Secondary | ICD-10-CM

## 2019-08-04 DIAGNOSIS — Z79899 Other long term (current) drug therapy: Secondary | ICD-10-CM

## 2019-08-04 DIAGNOSIS — M7989 Other specified soft tissue disorders: Secondary | ICD-10-CM

## 2019-08-04 DIAGNOSIS — R739 Hyperglycemia, unspecified: Secondary | ICD-10-CM | POA: Diagnosis not present

## 2019-08-04 DIAGNOSIS — I10 Essential (primary) hypertension: Secondary | ICD-10-CM | POA: Diagnosis not present

## 2019-08-04 DIAGNOSIS — K760 Fatty (change of) liver, not elsewhere classified: Secondary | ICD-10-CM

## 2019-08-04 DIAGNOSIS — J449 Chronic obstructive pulmonary disease, unspecified: Secondary | ICD-10-CM

## 2019-08-04 DIAGNOSIS — S92515A Nondisplaced fracture of proximal phalanx of left lesser toe(s), initial encounter for closed fracture: Secondary | ICD-10-CM | POA: Diagnosis not present

## 2019-08-04 DIAGNOSIS — N83209 Unspecified ovarian cyst, unspecified side: Secondary | ICD-10-CM

## 2019-08-04 LAB — CBC WITH DIFFERENTIAL/PLATELET
Basophils Absolute: 0.1 10*3/uL (ref 0.0–0.1)
Basophils Relative: 1.5 % (ref 0.0–3.0)
Eosinophils Absolute: 0.2 10*3/uL (ref 0.0–0.7)
Eosinophils Relative: 2.6 % (ref 0.0–5.0)
HCT: 41.8 % (ref 36.0–46.0)
Hemoglobin: 13.7 g/dL (ref 12.0–15.0)
Lymphocytes Relative: 23.1 % (ref 12.0–46.0)
Lymphs Abs: 2 10*3/uL (ref 0.7–4.0)
MCHC: 32.7 g/dL (ref 30.0–36.0)
MCV: 89.4 fl (ref 78.0–100.0)
Monocytes Absolute: 0.6 10*3/uL (ref 0.1–1.0)
Monocytes Relative: 7.1 % (ref 3.0–12.0)
Neutro Abs: 5.6 10*3/uL (ref 1.4–7.7)
Neutrophils Relative %: 65.7 % (ref 43.0–77.0)
Platelets: 207 10*3/uL (ref 150.0–400.0)
RBC: 4.68 Mil/uL (ref 3.87–5.11)
RDW: 16.2 % — ABNORMAL HIGH (ref 11.5–15.5)
WBC: 8.5 10*3/uL (ref 4.0–10.5)

## 2019-08-04 LAB — HEPATIC FUNCTION PANEL
ALT: 77 U/L — ABNORMAL HIGH (ref 0–35)
AST: 89 U/L — ABNORMAL HIGH (ref 0–37)
Albumin: 4.1 g/dL (ref 3.5–5.2)
Alkaline Phosphatase: 83 U/L (ref 39–117)
Bilirubin, Direct: 0.1 mg/dL (ref 0.0–0.3)
Total Bilirubin: 0.5 mg/dL (ref 0.2–1.2)
Total Protein: 7.4 g/dL (ref 6.0–8.3)

## 2019-08-04 LAB — SEDIMENTATION RATE: Sed Rate: 14 mm/hr (ref 0–30)

## 2019-08-04 LAB — C-REACTIVE PROTEIN: CRP: 3.6 mg/dL (ref 0.5–20.0)

## 2019-08-04 LAB — HEMOGLOBIN A1C: Hgb A1c MFr Bld: 6.9 % — ABNORMAL HIGH (ref 4.6–6.5)

## 2019-08-04 LAB — URIC ACID: Uric Acid, Serum: 10.3 mg/dL — ABNORMAL HIGH (ref 2.4–7.0)

## 2019-08-04 NOTE — Progress Notes (Signed)
So you may have  infection in th bone of  your toe and need to refer stat to orthopedic for  evaluation asap . If fever go to Ed otherwise please do stat referral  to   ortho

## 2019-08-04 NOTE — Patient Instructions (Addendum)
Get x ray and blood test today    Then will decide on  Treatment  Infection vs  Gout .  Blood test also  will help.   consider adding   Medication  For  blood sugar   Elevation   Will re refer  To gyne  For ovarian cyst .

## 2019-08-06 ENCOUNTER — Telehealth: Payer: Self-pay | Admitting: Internal Medicine

## 2019-08-06 NOTE — Telephone Encounter (Signed)
Pt returned your call and want a call back about her labs

## 2019-08-06 NOTE — Progress Notes (Signed)
Liver a bit better   a1c in diabetic early range  Blood count ok  Please have her  fu with me ( can be virtual ) after  she sees ortho and plan for her probable toe infection  evaluation . And treatment .(  Please make sure  she has  her stat appt with  ortho) thankds

## 2019-08-07 ENCOUNTER — Encounter: Payer: Self-pay | Admitting: Gastroenterology

## 2019-08-07 ENCOUNTER — Other Ambulatory Visit: Payer: Self-pay

## 2019-08-07 ENCOUNTER — Ambulatory Visit: Payer: Medicare Other | Admitting: Gastroenterology

## 2019-08-07 VITALS — BP 190/98 | HR 88 | Ht 64.25 in | Wt 229.4 lb

## 2019-08-07 DIAGNOSIS — R112 Nausea with vomiting, unspecified: Secondary | ICD-10-CM

## 2019-08-07 DIAGNOSIS — R109 Unspecified abdominal pain: Secondary | ICD-10-CM | POA: Diagnosis not present

## 2019-08-07 DIAGNOSIS — K7581 Nonalcoholic steatohepatitis (NASH): Secondary | ICD-10-CM

## 2019-08-07 DIAGNOSIS — M25572 Pain in left ankle and joints of left foot: Secondary | ICD-10-CM | POA: Diagnosis not present

## 2019-08-07 MED ORDER — BUSPIRONE HCL 10 MG PO TABS: 10.0000 mg | ORAL_TABLET | Freq: Three times a day (TID) | ORAL | 3 refills | Status: DC

## 2019-08-07 NOTE — Telephone Encounter (Signed)
Called patient and gave her lab results. Patient verbalized an understanding. See lab results for notes.

## 2019-08-07 NOTE — Progress Notes (Signed)
Referring Provider: Burnis Medin, MD Primary Care Physician:  Burnis Medin, MD  Chief complaint: Abdominal pain, nausea,  abnormal liver enzymes   IMPRESSION:  Near constant nausea and intermittent abdominal pain x 5-6 months    - negative H. pylori breath test 12/10/2008    - previously evaluated on EGD with Dr. Deatra Ina 05/12/14    - no source identified on CT scan 03/06/19    -No source identified on EGD or colonoscopy 2/21    - essentially normal GES 06/01/19 NASH by labs and imaging (Fatty liver on CT)    - Elevated transaminases with an AST of 139, ALT 114    - intermittently elevated for years    - normal TTGA and IgA 03/02/19    - Elastography 06/11/19: nigh probability of being normal with no significant fibrosis    - NASH FibroSURE showed F1-F2 disease, S3 marked/severe steatosis, N1 borderline/probably NASH New diagnosis of Insulin resistance - under evaluation by Dr. Noel Gerold Elevated ESR 62, normal CRP 2.6 Left-sided diverticulosis without history of diverticulitis Prior cholecystectomy 1983 Suspected endoscopic appearance of Barrett's     - esophageal biopsies negative 2016    - Esophageal biopsies negative for Barrett's 03/2019 History of esophagitis on EGD with Dr. Deatra Ina 05/12/2014 History of colon polyps    - 5 tubular adenomas removed on colonoscopy 03/2019    - surveillance recommended in 2024 No known family history of colon cancer or polyps  Abdominal pain and nausea: Mild gastritis on EGD. Gastric emptying scan is essentially normal. I do not expect the mild changes at 4 hours to be clinically significant. 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 Trying to avoid long-term antiemetics.  Trial of FDGard resulted in diarrhea, although it did seem to help with the nausea and abdominal pain. Trial of buspirone 10 mg TID recommended.    Fatty liver disease by labs and imaging: NASH  FibroSURE resulted reviewed with the patient. Supported by recent elastography results. Will need annual reassessment given the risk for disease progression in the setting of insulin resistance. Reviewed increased risk for cardiovascular disease and ways to "love the liver."  Diverticulosis: Continue high fiber diet, daily stool bulking agent with psyllum.   Suspected endoscopic appearance of Barrett's on prior endoscopy: Esophageal biopsies negative 2016. Normal endoscopic appearance of the esophagus and normal esophageal biopsies without evidence for Barrett's esophagus 2/21.  No further evaluation indicated at this time.   PLAN: Continue to use your daily probiotic Continue omeprazole 40 mg BID Zofran 81m q4 hours PRN Trial of buspirone 10 TID for 4 weeks (one month with 3 refills) Work to maintain a healthy weight, regular exercise encouraged Reduce daily alcohol intake Reviewed "love your liver" lifestyle changes Follow-up in 8-10 weeks, earlier if needed  I spent 30 minutes, including in depth chart review, independent review of results, communicating results with the patient directly, face-to-face time with the patient, coordinating care, and ordering studies and medications as appropriate, and documentation.      HPI: Samantha HINDLEYis a 64y.o. female under evaluation of abdominal pain, vomiting, and abnormal liver enzymes with supsected NASH.  The interval  history is obtained through the patient and review of her electronic health record.  She has COPD, hypertension, hypercholesterolemia, back pain, and a new diagnosis of insulin resistance. She had a cholecystectomy in 1981.   Seen by Dr. KDeatra Inain 2016 for several years  of nausea exacerbated by eating with associated abdominal distention and bloating.  He suspected gastroparesis, peptic ulcer disease or nonulcer dyspepsia.  Upper endoscopy 05/12/2014 showed suspected 3 cm segment of Barrett's esophagus.  The exam was otherwise  normal.  Pathology results showed chronic inflammation in the distal esophagus.  There was no intestinal metaplasia, dysplasia or malignancy.  She was treated with omeprazole 20 mg daily.  A gastric emptying scan was recommended but not performed.  Although she identifies having intermittent GI symptoms over the years, at the time of initial consultation she reported 5-6 months of progressive, intermittent abdominal pain located below her right rib and radiating across the mid-abdomen, constant nausea, and rare vomiting relieved by eating. No change with defecation.  Lying down and fetal position of the left side helps with the nausea.  Improved on Phenergan until her prescription ran out. Symptom relief with ibuprofen 400 mg BID until she learned this may be worsening her symptoms.  Trial of omeprazole including twice daily dosing but it seemed to result in more nausea so she stopped using it.   Seen in the emergency room 12/08/2018 for 5 days of abdominal pain and treated empirically for diverticulitis with Cipro and Flagyl.  No CT scan was done at that time.  She wonders if this is due to Covid-stress, worry, and isolation. She notes being depressed.   Incidentally found to have elevated transaminases, although they have been intermittently elevated since 2015. No identified risks for chronic viral hepatitis. Drinks vodka and wine a couple times a week. Alcohol intake has increase during Covid.   Labs 03/02/2019 showed an AST of 139 and an ALT of 114.  Total bilirubin 0.7, alk phos 86, albumin 4.0, total protein 7.6.  CRP 2.6.  ESR 62.  Normal CBC with a hemoglobin of 14.1, platelets 201.  TSH normal.  TTGA and IgA normal.  Labs from 04/10/2019 show a glucose of 122, fasting insulin of 26, smooth muscle antibody less than 20, amylase 26, lipase 24, iron 112, ferritin 160, IgG 1062, IgM 162, negative ANA, negative ANA, hep B surface antigen nonreactive, hep B core total antibody nonreactive  CT of the  abdomen and pelvis with contrast 03/06/2019 showed no acute findings to explain abdominal pain, hepatic steatosis, left-sided diverticulosis, prior cholecystectomy, prior appendectomy, prior hysterectomy, and a 2.2 cm left ovarian cyst.  I personally reviewed her CT images and compared them to the CT of the abdomen and pelvis with contrast from January 2018.  Diffuse fatty infiltration of the liver is seen in 2018 as well.  Distant testing shows a negative H. pylori breath test 12/10/2008  She reports having a colonoscopy at age 29. It was performed in [place].   Colonoscopy 04/13/19 showed left-sided diverticulosis; five 1 to 3 mm tubular adenomas in the sigmoid colon, in the descending colon, at the splenic flexure and in the ascending colon.  EGD 04/13/19 showed  Z-line irregular, 39 cm from the incisors. Biopsied. Normal stomach. Biopsied. Normal examined duodenum. Biopsied.  Gastric biopsies showed mild chronic gastritis, reactive gastropathy, and no Helicobacter pylori.  Esophageal biopsies showed reflux with no evidence for Barrett's esophagus or eosinophilic esophagitis.  Duodenal biopsies were normal.  On follow-up 05/12/19 she reported worsening flatus after starting probiotics. Bowel movements are now firm and she feels semi-constipated.  Had persistent nausea. Not continuous but seems to occur most before and after meals.  Using antiemetics at bedtime to prevent nausea.   Gastric emptying scan 06/01/19: Normal gastric emptying  until the 4 hour post meal showed 88% emptying        - 34.5% emptied at 1 hr ( normal >= 10%)        - 52.1% emptied at 2 hr ( normal >= 40%)        - 70.3% emptied at 3 hr ( normal >= 70%)         - 88.0% emptied at 4 hr ( normal >= 90%)     Abdominal ultrasound with elastography 06/11/19: echogenic liver, high probablity of being normal with no significant fibrosis  On follow-up 06/16/19 she  Noted improved but persistent abdominal pain. Continues to be a sharp  pain that may worsen with movement. There is associated early satiety, bloating, gas, and nausea. Sometimes she is so nauseated she can't eat. Less constipated since taking the probiotics. Now having one formed BM each morning. No blood or mucous. Weight stable.    NASH FibroSURE showed F1-F2 disease, S3 marked/severe steatosis, N1 borderline/probably NASH  Returns today reporting ongoing abdominal pain with associated early satiety, bloating, has and nausea despite daily probiotic, PPI BID, and Zofran PRN. She did not tolerate FDGard due to associated diarrhea.  No new complaints or concerns.   Past Medical History:  Diagnosis Date  . Chronic back pain    buldging disc and tumor.Spondylolisthesis  . COPD (chronic obstructive pulmonary disease) (HCC)    Symbicort daily and ProAir as needed  . Emphysema   . Emphysema of lung ([place])   . Fatty liver 04/13/2019  . Fibromyalgia   . Genital warts   . GERD (gastroesophageal reflux disease)    takes Omeprazole daily  . History of bronchitis 01/2014  . History of colon polyps   . Hyperlipidemia    has been off of meds d/t getting sick from them.Not been addressed again.   Marland Kitchen Hypertension    takes Ziac daily  . Nocturia   . Pneumonia    hx of-8+yrs ago  . PONV (postoperative nausea and vomiting)   . Shortness of breath dyspnea    thinks d/t pain meds and notices with exertion  . Stroke ([place])    16 yrs ago--left eye     Past Surgical History:  Procedure Laterality Date  . ABDOMINAL HYSTERECTOMY  1983   partial   . ADENOIDECTOMY    . ANTERIOR (CYSTOCELE) AND POSTERIOR REPAIR (RECTOCELE) WITH XENFORM GRAFT AND SACROSPINOUS FIXATION    . APPENDECTOMY  1967  . BREAST SURGERY Right    diseased milk glands  . CHOLECYSTECTOMY  1983  . COLONOSCOPY  04/13/2019  . DIAGNOSTIC LAPAROSCOPY     cyst removed from ovaries   . ESOPHAGOGASTRODUODENOSCOPY    . NECK SURGERY  [phone]   c spine ant and post hx fusion and removal or harcdware  and shavings  . POLYPECTOMY    . rods and screws in lumbar    . TONSILLECTOMY    . UPPER GASTROINTESTINAL ENDOSCOPY  04/13/2019    Current Outpatient Medications  Medication Sig Dispense Refill  . albuterol (PROAIR HFA) 108 (90 Base) MCG/ACT inhaler 2 puffs every 4 hours as needed only  if your can't catch your breath 18 g 1  . albuterol (PROVENTIL) (2.5 MG/3ML) 0.083% nebulizer solution Take 3 mLs (2.5 mg total) by nebulization every 6 (six) hours as needed for wheezing or shortness of breath. 75 mL 12  . bisoprolol-hydrochlorothiazide (ZIAC) 5-6.25 MG tablet One twice daily 180 tablet 3  . Budeson-Glycopyrrol-Formoterol (BREZTRI AEROSPHERE) 160-9-4.8 MCG/ACT  AERO Inhale 2 puffs into the lungs 2 (two) times daily. 10.7 g 11  . cetirizine (ZYRTEC) 10 MG tablet Take 10 mg by mouth daily.    . cloNIDine (CATAPRES) 0.1 MG tablet Take 1 tablet (0.1 mg total) by mouth 2 (two) times daily. 60 tablet 11  . gabapentin (NEURONTIN) 300 MG capsule 1 pill nightly 90 capsule 3  . Multiple Vitamins-Minerals (CENTRUM PO) Take 1 tablet by mouth daily.     Marland Kitchen omeprazole (PRILOSEC) 40 MG capsule Take 1 capsule (40 mg total) by mouth 2 (two) times daily. 60 capsule 3  . ondansetron (ZOFRAN) 4 MG tablet Take 1 tablet (4 mg total) by mouth every 4 (four) hours. 100 tablet 0  . venlafaxine XR (EFFEXOR XR) 75 MG 24 hr capsule Take 1 capsule (75 mg total) by mouth daily with breakfast. 90 capsule 3  . busPIRone (BUSPAR) 10 MG tablet Take 1 tablet (10 mg total) by mouth 3 (three) times daily. 90 tablet 3   No current facility-administered medications for this visit.    Allergies as of 08/07/2019 - Review Complete 08/07/2019  Allergen Reaction Noted  . Wellbutrin [bupropion] Nausea And Vomiting 02/17/2012    Family History  Problem Relation Age of Onset  . CAD Father        tumor between heart and lung   . Hypertension Father   . CAD Sister        ? dx   . Hypertension Mother   . Lung cancer Mother         died 66   . Diabetes Maternal Grandmother        late 82s   . Colon cancer Neg Hx   . Colon polyps Neg Hx   . Esophageal cancer Neg Hx   . Rectal cancer Neg Hx   . Stomach cancer Neg Hx     Social History   Socioeconomic History  . Marital status: Widowed    Spouse name: Not on file  . Number of children: 2  . Years of education: Not on file  . Highest education level: Not on file  Occupational History  . Occupation: retired    Fish farm manager: [name]  Tobacco Use  . Smoking status: Former Smoker    Packs/day: 0.25    Years: 44.00    Pack years: 11.00    Types: Cigarettes    Quit date: 02/04/2014    Years since quitting: 5.5  . Smokeless tobacco: Never Used  . Tobacco comment: quit smoking in Dec 2015  Vaping Use  . Vaping Use: Former  Substance and Sexual Activity  . Alcohol use: Yes    Alcohol/week: 4.0 standard drinks    Types: 4 Standard drinks or equivalent per week    Comment: couple times a week  . Drug use: No  . Sexual activity: Not on file  Other Topics Concern  . Not on file  Social History Narrative   6-8 hours of sleep per night   Lives with her fiance hh of 2 no pets    1 dog in the home   Retired no ets  But tob 8 per day   etoh ocass 1-2    On disability from her neck surgery predicaments .   orig from [employer] in [place] area.    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 2   Last Pap 2006 last mammogram 2000 and   Has dentures   FA  Social Determinants of Health   Financial Resource Strain:   . Difficulty of Paying Living Expenses:   Food Insecurity:   . Worried About Charity fundraiser in the Last Year:   . Arboriculturist in the Last Year:   Transportation Needs:   . Film/video editor (Medical):   Marland Kitchen Lack of Transportation (Non-Medical):   Physical Activity:   . Days of Exercise per Week:   . Minutes of Exercise per Session:   Stress:   . Feeling of Stress :   Social Connections:   . Frequency of  Communication with Friends and Family:   . Frequency of Social Gatherings with Friends and Family:   . Attends Religious Services:   . Active Member of Clubs or Organizations:   . Attends Archivist Meetings:   Marland Kitchen Marital Status:   Intimate Partner Violence:   . Fear of Current or Ex-Partner:   . Emotionally Abused:   Marland Kitchen Physically Abused:   . Sexually Abused:     Physical Exam: Gen: Awake, alert, and oriented, and well communicative. HEENT: EOMI, non-icteric sclera, NCAT, MMM  Neck: Normal movement of head and neck  Derm: No apparent lesions or bruising in visible field  MS: Moves all visible extremities without noticeable abnormality  Psych: Pleasant, cooperative, normal speech, thought processing seemingly intact     Saige Busby L. Tarri Glenn, MD, MPH 08/07/2019, 10:49 AM

## 2019-08-07 NOTE — Telephone Encounter (Signed)
Called patient and LMOVM to return call  Need to go over lab results with patient.

## 2019-08-07 NOTE — Telephone Encounter (Signed)
Patient returned Megan's call about lab results.

## 2019-08-07 NOTE — Patient Instructions (Addendum)
Continue to take your probiotic, omeprazole 40mg  twice daily, and Zofran as needed for nausea.   I would like to try buspirone to see if this improves your nausea and abdominal pain. This medications is taken 10 mg three times daily for at least 4 weeks. The biggest side effects are dizziness, headache, drowsiness, insomnia, and even nausea. Please stop the medication if any of these symptoms develop.  Work to maintain a health weight through health food choices, portion control, and exercise as your are able.   Your blood test shows fatty liver with only a little damage. Given these results it is important to keep your liver as healthy as possible and avoid anything that can damage your liver. Here are some important things you should do.  - Don't drink too much alcohol. How much is too much remains controversial, but it's probably best to avoid alcohol completely. - Make sure that none of your medications, herbs, and supplements are toxic to the liver; you can crosscheck your list with [employer] (an online resource).  Even acetaminophen (the generic ingredient in Tylenol and some cold medicines) may be harmful if you take too much for too long, especially if you have liver disease or drink alcohol heavily. - Get vaccinated to protect against liver viruses hepatitis A and B if you have not already done so.   Given your fatty liver, you are at higher risk for heart disease. Please take any potential symptoms seriously and seek evaluation urgently.   Let's plan to follow-up in 8-10 weeks, or earlier as necessary.   I appreciate the  opportunity to care for you  Thank You   Santo Held

## 2019-08-10 ENCOUNTER — Telehealth: Payer: Self-pay

## 2019-08-10 NOTE — Telephone Encounter (Signed)
Pt call was transferred to my line this morning. She advised that she had a foot fracture and that she had been treated by Raliegh Ip several weeks ago and that the office had advised that she needed referral to our office. I advised the pt that I did not see an appt on the schedule for her but I would happy to make it for her. She advised several times that she did not want to do that that she wanted to wait for the referral to be made. I advised the pt that she could call MW and check the status and the pt states that she has been in pain for too long and that she needs help now. Again I offered to make an appt and she was in agreement. appt for tomorrow with Dr. Sharol Given t 1:15

## 2019-08-11 ENCOUNTER — Encounter: Payer: Self-pay | Admitting: Orthopedic Surgery

## 2019-08-11 ENCOUNTER — Ambulatory Visit: Payer: Medicare Other | Admitting: Obstetrics and Gynecology

## 2019-08-11 ENCOUNTER — Ambulatory Visit: Payer: Medicare Other | Admitting: Orthopedic Surgery

## 2019-08-11 ENCOUNTER — Other Ambulatory Visit: Payer: Self-pay

## 2019-08-11 ENCOUNTER — Encounter: Payer: Self-pay | Admitting: Obstetrics and Gynecology

## 2019-08-11 VITALS — Ht 64.0 in | Wt 226.0 lb

## 2019-08-11 VITALS — BP 136/82 | Ht 64.0 in | Wt 226.0 lb

## 2019-08-11 DIAGNOSIS — N83202 Unspecified ovarian cyst, left side: Secondary | ICD-10-CM | POA: Diagnosis not present

## 2019-08-11 DIAGNOSIS — M1A0720 Idiopathic chronic gout, left ankle and foot, without tophus (tophi): Secondary | ICD-10-CM

## 2019-08-11 DIAGNOSIS — R1032 Left lower quadrant pain: Secondary | ICD-10-CM | POA: Diagnosis not present

## 2019-08-11 MED ORDER — COLCHICINE 0.6 MG PO CAPS: 0.6000 mg | ORAL_CAPSULE | Freq: Two times a day (BID) | ORAL | 1 refills | Status: DC | PRN

## 2019-08-11 MED ORDER — ALLOPURINOL 100 MG PO TABS: 100.0000 mg | ORAL_TABLET | Freq: Two times a day (BID) | ORAL | 3 refills | Status: DC

## 2019-08-11 NOTE — Progress Notes (Signed)
Samantha Hinton 11-Jun-1955 [id number]  SUBJECTIVE:  64 y.o. [id number] female referred by her primary doctor for evaluation of incidental findings of a left ovarian cyst that measured 2.2 cm on recent CT imaging conducted to evaluate abdominal pain and ongoing nausea.  She has had a GI evaluation with colonoscopy and upper endoscopy sounds like she had some benign colon polyps and Barrett's esophagus per her report.  GI notes also indicate diverticulosis and gastritis.  She had a prior TAH in 1980s in her 64s and had her ovaries in place.  She says that she has had a history of ovarian cysts and had laparoscopy more than 10 to 15 years ago when she still lived in [place].  She recalls having an ultrasound shortly before she moved to [place] about 13 years ago and there is no evidence of cysts at that time.  Sounds like she has a history of pelvic floor muscular discomfort and she describes a previous urology exam that indicated her pelvic floor was tender.  She never did any sort of pelvic floor PT.  Current Outpatient Medications  Medication Sig Dispense Refill  . albuterol (PROAIR HFA) 108 (90 Base) MCG/ACT inhaler 2 puffs every 4 hours as needed only  if your can't catch your breath 18 g 1  . albuterol (PROVENTIL) (2.5 MG/3ML) 0.083% nebulizer solution Take 3 mLs (2.5 mg total) by nebulization every 6 (six) hours as needed for wheezing or shortness of breath. 75 mL 12  . bisoprolol-hydrochlorothiazide (ZIAC) 5-6.25 MG tablet One twice daily 180 tablet 3  . Budeson-Glycopyrrol-Formoterol (BREZTRI AEROSPHERE) 160-9-4.8 MCG/ACT AERO Inhale 2 puffs into the lungs 2 (two) times daily. 10.7 g 11  . busPIRone (BUSPAR) 10 MG tablet Take 1 tablet (10 mg total) by mouth 3 (three) times daily. 90 tablet 3  . cetirizine (ZYRTEC) 10 MG tablet Take 10 mg by mouth daily.    . cloNIDine (CATAPRES) 0.1 MG tablet Take 1 tablet (0.1 mg total) by mouth 2 (two) times daily. 60 tablet 11  . gabapentin (NEURONTIN)  300 MG capsule 1 pill nightly 90 capsule 3  . Multiple Vitamins-Minerals (CENTRUM PO) Take 1 tablet by mouth daily.     Marland Kitchen omeprazole (PRILOSEC) 40 MG capsule Take 1 capsule (40 mg total) by mouth 2 (two) times daily. 60 capsule 3  . ondansetron (ZOFRAN) 4 MG tablet Take 1 tablet (4 mg total) by mouth every 4 (four) hours. 100 tablet 0  . venlafaxine XR (EFFEXOR XR) 75 MG 24 hr capsule Take 1 capsule (75 mg total) by mouth daily with breakfast. 90 capsule 3   No current facility-administered medications for this visit.   Allergies: Wellbutrin [bupropion]  No LMP recorded. Patient has had a hysterectomy.  Past medical history,surgical history, problem list, medications, allergies, family history and social history were all reviewed and documented as reviewed in the EPIC chart.  ROS:  Feeling well. No dyspnea or chest pain on exertion.  +LLQ abdominal pain, no change in bowel habits, black or bloody stools.  No urinary tract symptoms. GYN ROS: no abnormal bleeding, pelvic pain or discharge, no breast pain or new or enlarging lumps on self exam. No neurological complaints.   OBJECTIVE:  BP 136/82   Ht 5\' 4"  (1.626 m)   Wt 226 lb (102.5 kg)   BMI 38.79 kg/m  The patient appears well, alert, oriented x 3, in no distress. PELVIC EXAM: deferred to future visit  Chaperone: Caryn Bee present during the examination  ASSESSMENT:  64 y.o. [id number] here for evaluation of a left ovarian cyst  PLAN:  CT imaging from this year and 2018 are personally reviewed.  No mention of the left ovarian cyst on the 2018 CT report but the radiology report from the 2021 CT indicates that there is slight interval growth of the left ovarian cyst compared to the previous imaging.  Hard to tell when she was officially menopausal and given the prior hysterectomy but it is possible her ovaries are cycling all the way through her 40s if they were still hormonally active and it is possible the cyst is a remnant from her  premenopausal phase vs a neoplasm. We discussed that such a small ovarian cyst would not be likely to be contributing to any of her abdominal pain or nausea symptoms due to its small size.  It would be best to move forward with a pelvic ultrasound to better define the cyst.  If simple in appearance, we can continue to follow on annual basis with ultrasound, if any complex features, we will want to check a CA-125 and other tumor markers and possibly consider surgical removal of the ovary. We will plan to also perform a pelvic exam at her next visit and if she does have significant pelvic floor tenderness on exam then referral to pelvic floor PT would be a good idea. The patient understands all of the above and agrees to proceed with pelvic ultrasound as the next step.  All questions were answered by the end of the visit.    Joseph Pierini MD 08/11/19

## 2019-08-11 NOTE — Progress Notes (Signed)
Office Visit Note   Patient: Samantha Hinton           Date of Birth: January 04, 1956           MRN: [id number] Visit Date: 08/11/2019              Requested by: Burnis Medin, MD [address] [place],  [place] [address] PCP: Burnis Medin, MD  Chief Complaint  Patient presents with  . Left 2nd Toe - Pain    2ND TOE LEFT FOOT OSTEOMYELITIS Ref. By Dr. Percell Miller      HPI: Patient is a 64 year old woman who is seen for initial evaluation for swelling and destructive bony changes left foot second toe consistent with osteomyelitis.  Patient has completed a course of Keflex patient states she has pain and swelling which is worse at night.  Patient denies a history of diabetes denies a personal history of gout and denies a family history of gout.  Patient states she has smoked for prolonged period of time but she states she has not smoked now for several years.  Patient states she has burning radicular pain in the right lower extremity that developed after lumbar spine fusion and she is currently on Neurontin for this.  Patient denies any trauma to the left foot denies any history of ulcers drainage.  Assessment & Plan: Visit Diagnoses:  1. Chronic idiopathic gout involving toe of left foot without tophus     Plan: Clinically patient has gout causing the periarticular cystic changes to the second toe.  We will start on allopurinol and colchicine I will follow-up in 3 weeks with repeat uric acid studies.  Recommended a stiff soled shoe to unload pressure from the toe recommended a diet to decrease uric acid production recommended increasing her water intake and also recommended a decrease carbohydrate diet for her prediabetic elevated hemoglobin A1c  Follow-Up Instructions: Return in about 3 weeks (around 09/01/2019).   Ortho Exam  Patient is alert, oriented, no adenopathy, well-dressed, normal affect, normal respiratory effort. Examination patient has a strong dorsalis pedis  pulse she has swelling of the second toe without ulcers no drainage there is no cellulitis.  Review of the radiographs shows periarticular destructive cystic changes to the bone of the middle phalanx and the proximal and distal aspect of the proximal phalanx second toe left foot.  Patient's most recent hemoglobin A1c is 6.9 her uric acid is 10.3.  Imaging: No results found. No images are attached to the encounter.  Labs: Lab Results  Component Value Date   HGBA1C 6.9 (H) 08/04/2019   ESRSEDRATE 14 08/04/2019   ESRSEDRATE 62 (H) 03/02/2019   CRP 3.6 08/04/2019   CRP 2.6 03/02/2019   LABURIC 10.3 (H) 08/04/2019   REPTSTATUS 02/17/2014 FINAL 02/11/2014   CULT  02/11/2014    NO GROWTH 5 DAYS Performed at [facility] 03/13/2016     Lab Results  Component Value Date   ALBUMIN 4.1 08/04/2019   ALBUMIN 4.0 03/02/2019   ALBUMIN 4.6 10/14/2017   LABURIC 10.3 (H) 08/04/2019    Lab Results  Component Value Date   MG 1.8 08/28/2013   No results found for: VD25OH  No results found for: PREALBUMIN CBC EXTENDED Latest Ref Rng & Units 08/04/2019 06/30/2019 03/02/2019  WBC 4.0 - 10.5 K/uL 8.5 6.6 7.0  RBC 3.87 - 5.11 Mil/uL 4.68 4.74 4.63  HGB 12.0 - 15.0 g/dL 13.7 14.0 14.1  HCT 36 -  46 % 41.8 42.5 42.4  PLT 150 - 400 K/uL 207.0 181.0 201.0  NEUTROABS 1.4 - 7.7 K/uL 5.6 4.2 4.5  LYMPHSABS 0.7 - 4.0 K/uL 2.0 1.9 1.8     Body mass index is 38.79 kg/m.  Orders:  No orders of the defined types were placed in this encounter.  No orders of the defined types were placed in this encounter.    Procedures: No procedures performed  Clinical Data: No additional findings.  ROS:  All other systems negative, except as noted in the HPI. Review of Systems  Objective: Vital Signs: Ht 5\' 4"  (1.626 m)   Wt 226 lb (102.5 kg)   BMI 38.79 kg/m   Specialty Comments:  No specialty comments available.  PMFS History: Patient Active Problem List    Diagnosis Date Noted  . Morbid (severe) obesity due to excess calories ([place]) complicated by hbp 89/38/1017  . Reflex sympathetic dystrophy of right lower extremity 09/14/2017  . Right ankle pain 08/19/2017  . Abnormal LFTs 07/07/2015  . Spondylolisthesis of lumbar region 01/04/2015  . Colon cancer screening 05/07/2014  . Nausea without vomiting 03/20/2014  . Family history of lung cancer 12/01/2013  . Family history of heart disease 12/01/2013  . Hyperlipidemia 12/01/2013  . COPD with acute exacerbation ([place]) 08/28/2013  . Other and unspecified hyperlipidemia 07/16/2013  . Bruising 07/16/2013  . Intentional underdosing of medication regimen by patient due to financial hardship 06/16/2013  . Adjustment reaction with anxiety 06/16/2013  . Adjustment disorder with anxious mood 06/16/2013  . Nausea alone 06/16/2013  . Left breast lump 06/16/2013  . H/O cervical spine surgery 06/16/2013  . COPD GOLD II 04/12/2012  . Essential hypertension, benign 04/12/2012  . DOE (dyspnea on exertion) 02/17/2012  . Hypoxemia 02/17/2012   Past Medical History:  Diagnosis Date  . Chronic back pain    buldging disc and tumor.Spondylolisthesis  . COPD (chronic obstructive pulmonary disease) (HCC)    Symbicort daily and ProAir as needed  . Emphysema   . Emphysema of lung ([place])   . Fatty liver 04/13/2019  . Fibromyalgia   . Genital warts   . GERD (gastroesophageal reflux disease)    takes Omeprazole daily  . History of bronchitis 01/2014  . History of colon polyps   . Hyperlipidemia    has been off of meds d/t getting sick from them.Not been addressed again.   Marland Kitchen Hypertension    takes Ziac daily  . Nocturia   . Pneumonia    hx of-8+yrs ago  . PONV (postoperative nausea and vomiting)   . Shortness of breath dyspnea    thinks d/t pain meds and notices with exertion  . Stroke [facility])    16 yrs ago--left eye     Family History  Problem Relation Age of Onset  . CAD Father        tumor between  heart and lung   . Hypertension Father   . CAD Sister        ? dx   . Hypertension Mother   . Lung cancer Mother        died 81   . Diabetes Maternal Grandmother        late 46s   . Colon cancer Neg Hx   . Colon polyps Neg Hx   . Esophageal cancer Neg Hx   . Rectal cancer Neg Hx   . Stomach cancer Neg Hx     Past Surgical History:  Procedure Laterality Date  . ABDOMINAL  HYSTERECTOMY  1983   partial   . ADENOIDECTOMY    . ANTERIOR (CYSTOCELE) AND POSTERIOR REPAIR (RECTOCELE) WITH XENFORM GRAFT AND SACROSPINOUS FIXATION    . APPENDECTOMY  1967  . BREAST SURGERY Right    diseased milk glands  . CHOLECYSTECTOMY  1983  . COLONOSCOPY  04/13/2019  . DIAGNOSTIC LAPAROSCOPY     cyst removed from ovaries   . ESOPHAGOGASTRODUODENOSCOPY    . NECK SURGERY  [phone]   c spine ant and post hx fusion and removal or harcdware and shavings  . POLYPECTOMY    . rods and screws in lumbar    . TONSILLECTOMY    . UPPER GASTROINTESTINAL ENDOSCOPY  04/13/2019   Social History   Occupational History  . Occupation: retired    Fish farm manager: [name]  Tobacco Use  . Smoking status: Former Smoker    Packs/day: 0.25    Years: 44.00    Pack years: 11.00    Types: Cigarettes    Quit date: 02/04/2014    Years since quitting: 5.5  . Smokeless tobacco: Never Used  . Tobacco comment: quit smoking in Dec 2015  Vaping Use  . Vaping Use: Former  Substance and Sexual Activity  . Alcohol use: Yes    Alcohol/week: 4.0 standard drinks    Types: 4 Standard drinks or equivalent per week    Comment: couple times a week  . Drug use: No  . Sexual activity: Not Currently    Birth control/protection: Surgical    Comment: 1st intercourse 64 yo-Fewer than 5 partners

## 2019-08-17 ENCOUNTER — Other Ambulatory Visit: Payer: Self-pay | Admitting: Gastroenterology

## 2019-08-19 ENCOUNTER — Ambulatory Visit: Payer: Medicare Other | Admitting: Obstetrics and Gynecology

## 2019-08-19 ENCOUNTER — Other Ambulatory Visit: Payer: Medicare Other

## 2019-09-01 ENCOUNTER — Ambulatory Visit: Payer: Medicare Other | Admitting: Orthopedic Surgery

## 2019-09-14 ENCOUNTER — Other Ambulatory Visit: Payer: Self-pay | Admitting: Internal Medicine

## 2019-09-17 ENCOUNTER — Ambulatory Visit (INDEPENDENT_AMBULATORY_CARE_PROVIDER_SITE_OTHER): Payer: Medicare Other | Admitting: Orthopedic Surgery

## 2019-09-17 ENCOUNTER — Encounter: Payer: Self-pay | Admitting: Orthopedic Surgery

## 2019-09-17 VITALS — Ht 64.0 in | Wt 226.0 lb

## 2019-09-17 DIAGNOSIS — M1A0720 Idiopathic chronic gout, left ankle and foot, without tophus (tophi): Secondary | ICD-10-CM | POA: Diagnosis not present

## 2019-09-18 LAB — URIC ACID: Uric Acid, Serum: 4.5 mg/dL (ref 2.5–7.0)

## 2019-09-20 ENCOUNTER — Encounter: Payer: Self-pay | Admitting: Orthopedic Surgery

## 2019-09-20 NOTE — Progress Notes (Signed)
Office Visit Note   Patient: Samantha Hinton           Date of Birth: [birth year]           MRN: [id number] Visit Date: 09/17/2019              Requested by: Burnis Medin, MD [address] [place],  [place] [address] PCP: Burnis Medin, MD  Chief Complaint  Patient presents with  . Left Foot - Follow-up      HPI: Patient is a 64 year old woman who presents in follow-up for gout left foot second toe uric acid on last visit was 10.3.  Patient states the colchicine caused GI upset and she has stopped using it after 3 days.  Patient is currently on allopurinol twice a day.  She states she still has swelling in the second toe but decreased pain.  Assessment & Plan: Visit Diagnoses:  1. Chronic idiopathic gout involving toe of left foot without tophus     Plan: Labs obtained today repeat labs in 3 months.  Follow-Up Instructions: Return in about 3 months (around 12/18/2019).   Ortho Exam  Patient is alert, oriented, no adenopathy, well-dressed, normal affect, normal respiratory effort. Examination the redness and swelling of the second toe has decreased there is still no tophaceous changes and swelling but the active gouty process is improving she has no pain at this time.  Imaging: No results found. No images are attached to the encounter.  Labs: Lab Results  Component Value Date   HGBA1C 6.9 (H) 08/04/2019   ESRSEDRATE 14 08/04/2019   ESRSEDRATE 62 (H) 03/02/2019   CRP 3.6 08/04/2019   CRP 2.6 03/02/2019   LABURIC 4.5 09/17/2019   LABURIC 10.3 (H) 08/04/2019   REPTSTATUS 02/17/2014 FINAL 02/11/2014   CULT  02/11/2014    NO GROWTH 5 DAYS Performed at [facility] 03/13/2016     Lab Results  Component Value Date   ALBUMIN 4.1 08/04/2019   ALBUMIN 4.0 03/02/2019   ALBUMIN 4.6 10/14/2017   LABURIC 4.5 09/17/2019   LABURIC 10.3 (H) 08/04/2019    Lab Results  Component Value Date   MG 1.8 08/28/2013   No results  found for: VD25OH  No results found for: PREALBUMIN CBC EXTENDED Latest Ref Rng & Units 08/04/2019 06/30/2019 03/02/2019  WBC 4.0 - 10.5 K/uL 8.5 6.6 7.0  RBC 3.87 - 5.11 Mil/uL 4.68 4.74 4.63  HGB 12.0 - 15.0 g/dL 13.7 14.0 14.1  HCT 36 - 46 % 41.8 42.5 42.4  PLT 150 - 400 K/uL 207.0 181.0 201.0  NEUTROABS 1.4 - 7.7 K/uL 5.6 4.2 4.5  LYMPHSABS 0.7 - 4.0 K/uL 2.0 1.9 1.8     Body mass index is 38.79 kg/m.  Orders:  Orders Placed This Encounter  Procedures  . Uric acid   No orders of the defined types were placed in this encounter.    Procedures: No procedures performed  Clinical Data: No additional findings.  ROS:  All other systems negative, except as noted in the HPI. Review of Systems  Objective: Vital Signs: Ht 5\' 4"  (1.626 m)   Wt (!) 226 lb (102.5 kg)   BMI 38.79 kg/m   Specialty Comments:  No specialty comments available.  PMFS History: Patient Active Problem List   Diagnosis Date Noted  . Morbid (severe) obesity due to excess calories ([place]) complicated by hbp 10/02/4816  . Reflex sympathetic dystrophy of right lower extremity 09/14/2017  .  Right ankle pain 08/19/2017  . Abnormal LFTs 07/07/2015  . Spondylolisthesis of lumbar region 01/04/2015  . Colon cancer screening 05/07/2014  . Nausea without vomiting 03/20/2014  . Family history of lung cancer 12/01/2013  . Family history of heart disease 12/01/2013  . Hyperlipidemia 12/01/2013  . COPD with acute exacerbation ([place]) 08/28/2013  . Other and unspecified hyperlipidemia 07/16/2013  . Bruising 07/16/2013  . Intentional underdosing of medication regimen by patient due to financial hardship 06/16/2013  . Adjustment reaction with anxiety 06/16/2013  . Adjustment disorder with anxious mood 06/16/2013  . Nausea alone 06/16/2013  . Left breast lump 06/16/2013  . H/O cervical spine surgery 06/16/2013  . COPD GOLD II 04/12/2012  . Essential hypertension, benign 04/12/2012  . DOE (dyspnea on exertion)  02/17/2012  . Hypoxemia 02/17/2012   Past Medical History:  Diagnosis Date  . Chronic back pain    buldging disc and tumor.Spondylolisthesis  . COPD (chronic obstructive pulmonary disease) (HCC)    Symbicort daily and ProAir as needed  . Emphysema   . Emphysema of lung ([place])   . Fatty liver 04/13/2019  . Fibromyalgia   . Genital warts   . GERD (gastroesophageal reflux disease)    takes Omeprazole daily  . History of bronchitis 01/2014  . History of colon polyps   . Hyperlipidemia    has been off of meds d/t getting sick from them.Not been addressed again.   Marland Kitchen Hypertension    takes Ziac daily  . Nocturia   . Pneumonia    hx of-8+yrs ago  . PONV (postoperative nausea and vomiting)   . Shortness of breath dyspnea    thinks d/t pain meds and notices with exertion  . Stroke [facility])    16 yrs ago--left eye     Family History  Problem Relation Age of Onset  . CAD Father        tumor between heart and lung   . Hypertension Father   . CAD Sister        ? dx   . Hypertension Mother   . Lung cancer Mother        died 3   . Diabetes Maternal Grandmother        late 22s   . Colon cancer Neg Hx   . Colon polyps Neg Hx   . Esophageal cancer Neg Hx   . Rectal cancer Neg Hx   . Stomach cancer Neg Hx     Past Surgical History:  Procedure Laterality Date  . ABDOMINAL HYSTERECTOMY  1983   partial   . ADENOIDECTOMY    . ANTERIOR (CYSTOCELE) AND POSTERIOR REPAIR (RECTOCELE) WITH XENFORM GRAFT AND SACROSPINOUS FIXATION    . APPENDECTOMY  1967  . BREAST SURGERY Right    diseased milk glands  . CHOLECYSTECTOMY  1983  . COLONOSCOPY  04/13/2019  . DIAGNOSTIC LAPAROSCOPY     cyst removed from ovaries   . ESOPHAGOGASTRODUODENOSCOPY    . NECK SURGERY  [phone]   c spine ant and post hx fusion and removal or harcdware and shavings  . POLYPECTOMY    . rods and screws in lumbar    . TONSILLECTOMY    . UPPER GASTROINTESTINAL ENDOSCOPY  04/13/2019   Social History    Occupational History  . Occupation: retired    Fish farm manager: [name]  Tobacco Use  . Smoking status: Former Smoker    Packs/day: 0.25    Years: 44.00    Pack years: 11.00    Types:  Cigarettes    Quit date: 02/04/2014    Years since quitting: 5.6  . Smokeless tobacco: Never Used  . Tobacco comment: quit smoking in Dec 2015  Vaping Use  . Vaping Use: Former  Substance and Sexual Activity  . Alcohol use: Yes    Alcohol/week: 4.0 standard drinks    Types: 4 Standard drinks or equivalent per week    Comment: couple times a week  . Drug use: No  . Sexual activity: Not Currently    Birth control/protection: Surgical    Comment: 1st intercourse 64 yo-Fewer than 5 partners

## 2019-10-06 ENCOUNTER — Ambulatory Visit: Payer: Medicare Other | Admitting: Gastroenterology

## 2019-10-21 DIAGNOSIS — H40033 Anatomical narrow angle, bilateral: Secondary | ICD-10-CM | POA: Diagnosis not present

## 2019-10-21 DIAGNOSIS — H2513 Age-related nuclear cataract, bilateral: Secondary | ICD-10-CM | POA: Diagnosis not present

## 2019-11-26 ENCOUNTER — Telehealth: Payer: Self-pay | Admitting: Internal Medicine

## 2019-11-26 NOTE — Progress Notes (Signed)
  Chronic Care Management   Outreach Note  11/26/2019 Name: Samantha Hinton MRN: [id number] DOB: 21-Dec-1955  Referred by: Burnis Medin, MD Reason for referral : No chief complaint on file.   An unsuccessful telephone outreach was attempted today. The patient was referred to the pharmacist for assistance with care management and care coordination.   Follow Up Plan:   [name] [employer] Scheduler

## 2019-11-27 ENCOUNTER — Telehealth: Payer: Self-pay | Admitting: Internal Medicine

## 2019-11-27 NOTE — Telephone Encounter (Signed)
Left message for patient to call back and schedule Medicare Annual Wellness Visit (AWV) either virtually or in office.  Last AWV 07/06/15; please schedule at anytime with [facility] Nurse Health Advisor 2.  This should be a 45 minute visit.

## 2019-12-02 ENCOUNTER — Telehealth: Payer: Self-pay | Admitting: Internal Medicine

## 2019-12-02 NOTE — Progress Notes (Signed)
  Chronic Care Management   Note  12/02/2019 Name: [name] MRN: [id number] DOB: [birth year]  Samantha Hinton is a 64 y.o. year old female who is a primary care patient of Panosh, Standley Brooking, MD. I reached out to Samantha Hinton by phone today in response to a referral sent by Ms. Chevis Pretty PCP, Panosh, Standley Brooking, MD.   [name] was given information about Chronic Care Management services today including:  1. CCM service includes personalized support from designated clinical staff supervised by her physician, including individualized plan of care and coordination with other care providers 2. 24/7 contact phone numbers for assistance for urgent and routine care needs. 3. Service will only be billed when office clinical staff spend 20 minutes or more in a month to coordinate care. 4. Only one practitioner may furnish and bill the service in a calendar month. 5. The patient may stop CCM services at any time (effective at the end of the month) by phone call to the office staff.   Patient agreed to services and verbal consent obtained.   Follow up plan:   [name] [employer] Scheduler

## 2019-12-14 ENCOUNTER — Other Ambulatory Visit: Payer: Self-pay | Admitting: Orthopedic Surgery

## 2019-12-14 ENCOUNTER — Other Ambulatory Visit: Payer: Self-pay | Admitting: Internal Medicine

## 2019-12-14 ENCOUNTER — Other Ambulatory Visit: Payer: Self-pay | Admitting: Gastroenterology

## 2019-12-14 NOTE — Telephone Encounter (Signed)
Please advise 

## 2019-12-17 ENCOUNTER — Ambulatory Visit: Payer: Medicare Other | Admitting: Orthopedic Surgery

## 2019-12-23 ENCOUNTER — Other Ambulatory Visit: Payer: Self-pay | Admitting: Internal Medicine

## 2020-01-06 ENCOUNTER — Other Ambulatory Visit: Payer: Self-pay | Admitting: Orthopedic Surgery

## 2020-01-06 ENCOUNTER — Other Ambulatory Visit: Payer: Self-pay | Admitting: Internal Medicine

## 2020-01-06 ENCOUNTER — Other Ambulatory Visit: Payer: Self-pay | Admitting: Gastroenterology

## 2020-01-12 ENCOUNTER — Other Ambulatory Visit: Payer: Self-pay | Admitting: Gastroenterology

## 2020-01-27 ENCOUNTER — Other Ambulatory Visit: Payer: Self-pay | Admitting: Internal Medicine

## 2020-02-03 ENCOUNTER — Telehealth: Payer: Self-pay | Admitting: Pharmacist

## 2020-02-03 ENCOUNTER — Telehealth: Payer: Self-pay

## 2020-02-03 DIAGNOSIS — E785 Hyperlipidemia, unspecified: Secondary | ICD-10-CM

## 2020-02-03 DIAGNOSIS — I10 Essential (primary) hypertension: Secondary | ICD-10-CM

## 2020-02-03 NOTE — Telephone Encounter (Signed)
-----   Message from Viona Gilmore, [facility] sent at 02/03/2020  8:27 AM EST ----- Regarding: CCM referral Hi Greydon Betke,  Can you please put in a CCM referral for Ms. [name], one of Dr. Velora Mediate patients? If you can't find the referral code, it was updated last week to [id number].  Thank you, Maddie

## 2020-02-03 NOTE — Chronic Care Management (AMB) (Signed)
I left the patient a message about her upcoming appointment on 02-04-2020 @ 2:00 PM with the clinical pharmacist. She was asked to please have all medication on hand to review the pharmacist.    Samantha Hinton) Mare Ferrari, [facility] Assistant [phone]

## 2020-02-04 ENCOUNTER — Ambulatory Visit: Payer: Medicare Other

## 2020-02-04 NOTE — Chronic Care Management (AMB) (Deleted)
Chronic Care Management Pharmacy  Name: Samantha Hinton  MRN: [id number] DOB: [birth year]  Initial Planning Appointment: ***  Initial Questions: 1. Have you seen any other providers since your last visit? n/a 2. Any changes in your medicines or health? No   Chief Complaint/ HPI  Samantha Hinton,  64 y.o. , female presents for their Initial CCM visit with the clinical pharmacist In office.  PCP : Burnis Medin, MD  Their chronic conditions include: {CHL AMB CHRONIC MEDICAL CONDITIONS:[phone]}  Office Visits: -08/04/19 Shanon Ace, MD: Patient presented with toe pain. BP elevated in office. A1c elevated at 6.9%. Uric acid elevated at 10.3. Referral placed for ortho and OBGYN. Follow up after ortho appt.  Consult Visit: -09/17/19 Meridee Score, MD (orthopedic surgery): Patient presented for gout follow up. Uric acid was WNL. Patient did not take colchicine after 3 days and is taking allopurinol. Follow up in 3 months.  -08/11/19 Meridee Score, MD (orthopedic surgery): Patient presented for gout initial evaluation. Prescribed colchicine and allopurinol BID. Follow up in 3 weeks.  -08/11/19 Joseph Pierini, MD (OBGYN): Patient presented for initial visit for ovarian cyst evaluation. Plan for pelvic exam at next visit.  -08/07/19 Thornton Park, MD (gastro): Patient presented for fatty liver follow up.  Trial of buspirone 10 mg TID x 4 weeks. Follow up in 8-10 weeks.  -06/29/19 Christinia Gully, MD (pulmonary): Patient presented for COPD follow up. Breathing has worsened over last 2 weeks. Switching Bevespi to [employer] inhaler - take 1 puffs first thing in the morning and then 2 puffs 12 hours later.  Medications: Outpatient Encounter Medications as of 02/04/2020  Medication Sig  . albuterol (PROAIR HFA) 108 (90 Base) MCG/ACT inhaler 2 puffs every 4 hours as needed only  if your can't catch your breath  . albuterol (PROVENTIL) (2.5 MG/3ML) 0.083% nebulizer solution Take 3 mLs (2.5 mg  total) by nebulization every 6 (six) hours as needed for wheezing or shortness of breath.  . allopurinol (ZYLOPRIM) 100 MG tablet Take 1 tablet by mouth twice daily  . bisoprolol-hydrochlorothiazide (ZIAC) 5-6.25 MG tablet Take 1 tablet by mouth twice daily  . Budeson-Glycopyrrol-Formoterol (BREZTRI AEROSPHERE) 160-9-4.8 MCG/ACT AERO Inhale 2 puffs into the lungs 2 (two) times daily.  . busPIRone (BUSPAR) 10 MG tablet TAKE 1 TABLET BY MOUTH THREE TIMES DAILY  . cetirizine (ZYRTEC) 10 MG tablet Take 10 mg by mouth daily.  . cloNIDine (CATAPRES) 0.1 MG tablet Take 1 tablet by mouth twice daily  . Colchicine 0.6 MG CAPS Take 0.6 mg by mouth 2 (two) times daily as needed.  . gabapentin (NEURONTIN) 300 MG capsule 1 pill nightly  . Multiple Vitamins-Minerals (CENTRUM PO) Take 1 tablet by mouth daily.   Marland Kitchen omeprazole (PRILOSEC) 40 MG capsule TAKE 1 CAPSULE BY MOUTH 30 TO 60 MINUTES BEFORE YOUR FIRST AND LAST MEALS OF THE DAY  . ondansetron (ZOFRAN) 4 MG tablet Take 1 tablet (4 mg total) by mouth every 4 (four) hours.  . ondansetron (ZOFRAN-ODT) 4 MG disintegrating tablet Take 1 tablet (4 mg total) by mouth every 8 (eight) hours as needed for nausea or vomiting. Please schedule a follow up appointment for further refills.  . venlafaxine XR (EFFEXOR XR) 75 MG 24 hr capsule Take 1 capsule (75 mg total) by mouth daily with breakfast.   No facility-administered encounter medications on file as of 02/04/2020.     Current Diagnosis/Assessment:  Goals Addressed   None     COPD   Last spirometry score: ***  Gold Grade: {CHL HP [facility] Pharm COPD Gold LEXNT:[id number]} Current COPD Classification:  {CHL HP [facility] Pharm COPD Classification:[phone]} No flowsheet data found. Lab Results  Component Value Date/Time   EOSPCT 2.6 08/04/2019 03:49 PM   EOSABS 0.2 08/04/2019 03:49 PM    Patient has failed these meds in past: *** Patient is currently {CHL Controlled/Uncontrolled:[phone]} on the  following medications:  . Albuterol HFA 108 mcg/act PRN . Albuterol nebulizer PRN . Breztri 160-9-4.8 mcg/act inhale 2 puffs twice daily   Using maintenance inhaler regularly? {yes/no:20286} Frequency of rescue inhaler use:  {CHL HP [facility] Pharm Inhaler QPRF:1638466599}  We discussed:  {CHL HP [facility] Pharmacy discussion:[phone]}  Plan  Continue {CHL HP [facility] Pharmacy Plans:8723088982}  Hypertension   BP goal is:  {CHL HP UPSTREAM Pharmacist BP ranges:[phone]}  Office blood pressures are  BP Readings from Last 3 Encounters:  08/11/19 136/82  08/07/19 (!) 190/98  08/04/19 (!) 146/80   Patient checks BP at home {CHL HP BP Monitoring Frequency:([phone]} Patient home BP readings are ranging: ***  Patient has failed these meds in the past: *** Patient is currently {CHL Controlled/Uncontrolled:[phone]} on the following medications:  . Bisoprolol-HCTZ 6-6.25 mg 1 tablet BID . Clonidine 0.1 mg 1 tablet twice daily  We discussed diet and exercise extensively -DASH eating plan recommendations: . Emphasizes vegetables, fruits, and whole-grains . Includes fat-free or low-fat dairy products, fish, poultry, beans, nuts, and vegetable oils . Limits foods that are high in saturated fat. These foods include fatty meats, full-fat dairy products, and tropical oils such as coconut, palm kernel, and palm oils. . Limits sugar-sweetened beverages and sweets . Limiting sodium intake to < 1500 mg/day -Discussed recommendations for moderate aerobic exercise for 150 minutes/week spread out over 5 days for heart healthy lifestyle  Plan  Continue {CHL HP [facility] Pharmacy Plans:8723088982}    Gout   Uric acid goal < 6  Lab Results  Component Value Date   LABURIC 4.5 09/17/2019   LABURIC 10.3 (H) 08/04/2019    Patient is currently controlled on:  . Allopurinol 100 mg 1 tablet BID . Colchicine 0.6 mg capsule 1 capsule twice daily as needed  We discussed: Avoiding/limiting  foods with high purine content: . wild game, such as veal, venison, and duck . red meat . some seafood, including tuna, sardines, anchovies, herring, mussels, codfish, scallops, trout, and haddock . organ meat, such as liver, kidneys, and thymus glands, which are known as sweetbreads  Avoiding/limiting foods to allow the body to process purines more effectively: . High-fat foods: Fat holds uric acid in the kidneys, so a person should avoid fried foods, full-fat dairy products, rich desserts, and other high-fat items. . Alcohol: Beer and whiskey are high in purines, but some research shows that all alcohol consumption can raise uric acid levels. Alcohol also causes dehydration, which hampers the body's ability to flush out uric acid. . Sweetened beverages: Fructose is an ingredient in many sweetened beverages, including fruit juices and sodas, and consuming too much puts a person at risk for gout.  Plan  Continue {CHL HP [facility] Pharmacy JTTSV:7793903009}   Anxiety   Patient has failed these meds in past: *** Patient is currently {CHL Controlled/Uncontrolled:[phone]} on the following medications:  . Venlafaxine XR 75 mg 1 capsule daily with breakfast . Buspirone 10 mg 1 tablet TID  We discussed:  ***  Plan  Continue {CHL HP [facility] Pharmacy Plans:8723088982}   Allergic rhinitis   Patient has failed these meds in past: *** Patient is currently {  CHL Controlled/Uncontrolled:[phone]} on the following medications:  . Cetirizine 10 mg tablet 1 tablet daily  We discussed:  ***  Plan  Continue {CHL HP [facility] Pharmacy NHAFB:9038333832}   Fatty liver/nausea/vomiting   Patient has failed these meds in past: *** Patient is currently {CHL Controlled/Uncontrolled:[phone]} on the following medications:  . Omeprazole 40 mg 1 capsule BID . Zofran 4 mg 1 tablet PRN . Probiotic 1 tablet daily  We discussed:  ***  Plan  Continue {CHL HP [facility] Pharmacy  NVBTY:6060045997}  ***   Patient has failed these meds in past: *** Patient is currently {CHL Controlled/Uncontrolled:[phone]} on the following medications:  Marland Kitchen Multivitamin 1 tablet daily  We discussed:  ***  Plan  Continue {CHL HP [facility] Pharmacy FSFSE:3953202334}   Vaccines   Reviewed and discussed patient's vaccination history.    Immunization History  Administered Date(s) Administered  . Influenza Split 03/19/2012  . Influenza,inj,Quad PF,6+ Mos 12/01/2013, 10/19/2014, 03/13/2016, 05/03/2017, 10/14/2017  . PFIZER SARS-COV-2 Vaccination 05/14/2019, 06/04/2019  . Pneumococcal Conjugate-13 07/16/2013  . Pneumococcal Polysaccharide-23 07/25/2016  . Tdap 05/24/2014   Booster? Influenza? Shingles?  Plan  Recommended patient receive *** vaccine in *** office.   Medication Management   Patient's preferred pharmacy is:  [facility] [address], [place] - [address] [place] [place] [address] Phone: [phone] Fax: [phone]  Uses pill box? {Yes or If no, why not?:20788} Pt endorses ***% compliance  We discussed: {Pharmacy options:24294}  Plan  {US Pharmacy QAES:97530} Same as upstream   Follow up: *** month phone visit  Jeni Salles, PharmD [place] Pharmacist [facility]

## 2020-02-18 ENCOUNTER — Telehealth: Payer: Self-pay | Admitting: Internal Medicine

## 2020-02-18 NOTE — Progress Notes (Signed)
  Chronic Care Management   Note  02/18/2020 Name: ROSMERY DUGGIN MRN: [id number] DOB: [birth year]  [name] is a 64 y.o. year old female who is a primary care patient of Panosh, Neta Mends, MD. I reached out to [name] by phone today in response to a referral sent by Ms. Rory Percy PCP, Panosh, Neta Mends, MD.   Ms. Krenzer was given information about Chronic Care Management services today including:  1. CCM service includes personalized support from designated clinical staff supervised by her physician, including individualized plan of care and coordination with other care providers 2. 24/7 contact phone numbers for assistance for urgent and routine care needs. 3. Service will only be billed when office clinical staff spend 20 minutes or more in a month to coordinate care. 4. Only one practitioner may furnish and bill the service in a calendar month. 5. The patient may stop CCM services at any time (effective at the end of the month) by phone call to the office staff.   Patient agreed to services and verbal consent obtained.   Follow up plan:   [name] [facility] Scheduler

## 2020-03-01 ENCOUNTER — Telehealth: Payer: Medicare Other

## 2020-03-02 ENCOUNTER — Other Ambulatory Visit: Payer: Self-pay | Admitting: Gastroenterology

## 2020-03-02 NOTE — Telephone Encounter (Signed)
Must have office visit prior to any additional refills

## 2020-03-11 ENCOUNTER — Telehealth: Payer: Self-pay | Admitting: Internal Medicine

## 2020-03-11 NOTE — Telephone Encounter (Signed)
Left message for patient to call back and schedule Medicare Annual Wellness Visit (AWV) either virtually or in office.   Last AWV 07/06/15  please schedule at anytime with [facility] Nurse Health Advisor 1 or 2   This should be a 45 minute visit.

## 2020-03-30 ENCOUNTER — Other Ambulatory Visit: Payer: Self-pay | Admitting: Gastroenterology

## 2020-03-30 ENCOUNTER — Other Ambulatory Visit: Payer: Self-pay | Admitting: Orthopedic Surgery

## 2020-04-05 ENCOUNTER — Other Ambulatory Visit: Payer: Self-pay | Admitting: Family Medicine

## 2020-04-05 NOTE — Telephone Encounter (Signed)
Left message for patient to call back to schedule. Have spot on hold on Friday for patient.

## 2020-04-07 NOTE — Progress Notes (Unsigned)
[facility] [address] [place] Phone: [phone] Subjective:   [name] am serving as a Education administrator for Dr. Hulan Saas.  This visit occurred during the SARS-CoV-2 public health emergency.  Safety protocols were in place, including screening questions prior to the visit, additional usage of staff PPE, and extensive cleaning of exam room while observing appropriate contact time as indicated for disinfecting solutions.   I'm seeing this patient by the request  of:  Panosh, Standley Brooking, MD  CC: Bilateral leg pain, back pain  FHL:[id number]   04/22/2019  65 year old female with chronic back pain with a spondylolisthesis that seems to be stable with medications.  Already discussed with social determinants of health secondary to financial hardship having difficulty with transportation to even coming to the office.  Refill medications at this time.  As long as patient is able to continue to work and taking medications without seeing patient again at least once a year otherwise worsening symptoms I would like to evaluate patient in office   Update 04/08/2020 Samantha Hinton is a 65 y.o. female coming in with complaint of back pain and right ankle. Here to discuss gabapentin 300mg  refill. States she is having nerve pain with the ankle. Painful with walking. Lower back pain is also causing issues with walking. States the gabapentin is not working anymore. Bilateral burning in the feet. 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   Onset- Chronic  Character- burning, numbness and tingling Aggravating factors- walking  Reliving factors- flexion Therapies tried- ice, heat, topical and oral meds and nothing really helps  Severity-8 out of 10   Patient's previous MRI was in 2016 of the back showing a moderately severe spinal stenosis at L4-L5  Past Medical History:  Diagnosis Date  . Chronic back pain    buldging disc and tumor.Spondylolisthesis  . COPD  (chronic obstructive pulmonary disease) (HCC)    Symbicort daily and ProAir as needed  . Emphysema   . Emphysema of lung ([facility])   . Fatty liver 04/13/2019  . Fibromyalgia   . Genital warts   . GERD (gastroesophageal reflux disease)    takes Omeprazole daily  . History of bronchitis 01/2014  . History of colon polyps   . Hyperlipidemia    has been off of meds d/t getting sick from them.Not been addressed again.   Marland Kitchen Hypertension    takes Ziac daily  . Nocturia   . Pneumonia    hx of-8+yrs ago  . PONV (postoperative nausea and vomiting)   . Shortness of breath dyspnea    thinks d/t pain meds and notices with exertion  . Stroke ([facility])    16 yrs ago--left eye    Past Surgical History:  Procedure Laterality Date  . ABDOMINAL HYSTERECTOMY  1983   partial   . ADENOIDECTOMY    . ANTERIOR (CYSTOCELE) AND POSTERIOR REPAIR (RECTOCELE) WITH XENFORM GRAFT AND SACROSPINOUS FIXATION    . APPENDECTOMY  1967  . BREAST SURGERY Right    diseased milk glands  . CHOLECYSTECTOMY  1983  . COLONOSCOPY  04/13/2019  . DIAGNOSTIC LAPAROSCOPY     cyst removed from ovaries   . ESOPHAGOGASTRODUODENOSCOPY    . NECK SURGERY  [phone]   c spine ant and post hx fusion and removal or harcdware and shavings  . POLYPECTOMY    . rods and screws in lumbar    . TONSILLECTOMY    . UPPER GASTROINTESTINAL ENDOSCOPY  04/13/2019  Social History   Socioeconomic History  . Marital status: Widowed    Spouse name: Not on file  . Number of children: 2  . Years of education: Not on file  . Highest education level: Not on file  Occupational History  . Occupation: retired    Fish farm manager: [name]  Tobacco Use  . Smoking status: Former Smoker    Packs/day: 0.25    Years: 44.00    Pack years: 11.00    Types: Cigarettes    Quit date: 02/04/2014    Years since quitting: 6.1  . Smokeless tobacco: Never Used  . Tobacco comment: quit smoking in Dec 2015  Vaping Use  . Vaping Use: Former  Substance and  Sexual Activity  . Alcohol use: Yes    Alcohol/week: 4.0 standard drinks    Types: 4 Standard drinks or equivalent per week    Comment: couple times a week  . Drug use: No  . Sexual activity: Not Currently    Birth control/protection: Surgical    Comment: 1st intercourse 65 yo-Fewer than 5 partners  Other Topics Concern  . Not on file  Social History Narrative   6-8 hours of sleep per night   Lives with her fiance hh of 2 no pets    1 dog in the home   Retired no ets  But tob 8 per day   etoh ocass 1-2    On disability from her neck surgery predicaments .   orig from [employer] in [place] area.    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 2   Last Pap 2006 last mammogram 2000 and   Has dentures   FA    Social Determinants of Health   Financial Resource Strain: Not on file  Food Insecurity: Not on file  Transportation Needs: Not on file  Physical Activity: Not on file  Stress: Not on file  Social Connections: Not on file   Allergies  Allergen Reactions  . Wellbutrin [Bupropion] Nausea And Vomiting   Family History  Problem Relation Age of Onset  . CAD Father        tumor between heart and lung   . Hypertension Father   . CAD Sister        ? dx   . Hypertension Mother   . Lung cancer Mother        died 58   . Diabetes Maternal Grandmother        late 99s   . Colon cancer Neg Hx   . Colon polyps Neg Hx   . Esophageal cancer Neg Hx   . Rectal cancer Neg Hx   . Stomach cancer Neg Hx      Current Outpatient Medications (Cardiovascular):  .  bisoprolol-hydrochlorothiazide (ZIAC) 5-6.25 MG tablet, Take 1 tablet by mouth twice daily .  cloNIDine (CATAPRES) 0.1 MG tablet, Take 1 tablet by mouth twice daily  Current Outpatient Medications (Respiratory):  .  albuterol (PROAIR HFA) 108 (90 Base) MCG/ACT inhaler, 2 puffs every 4 hours as needed only  if your can't catch your breath .  albuterol (PROVENTIL) (2.5 MG/3ML) 0.083% nebulizer solution, Take  3 mLs (2.5 mg total) by nebulization every 6 (six) hours as needed for wheezing or shortness of breath. .  Budeson-Glycopyrrol-Formoterol (BREZTRI AEROSPHERE) 160-9-4.8 MCG/ACT AERO, Inhale 2 puffs into the lungs 2 (two) times daily. .  cetirizine (ZYRTEC) 10 MG tablet, Take 10 mg by mouth daily.  Current Outpatient Medications (Analgesics):  .  allopurinol (ZYLOPRIM) 100 MG tablet, Take 1 tablet by mouth twice daily .  Colchicine 0.6 MG CAPS, Take 0.6 mg by mouth 2 (two) times daily as needed.   Current Outpatient Medications (Other):  .  busPIRone (BUSPAR) 10 MG tablet, TAKE 1 TABLET BY MOUTH THREE TIMES DAILY. MUST HAVE OFFICE VISIT FOR REFILLS .  gabapentin (NEURONTIN) 100 MG capsule, Take 2 capsules (200 mg total) by mouth 2 (two) times daily. Marland Kitchen  gabapentin (NEURONTIN) 300 MG capsule, 1 pill nightly .  gabapentin (NEURONTIN) 300 MG capsule, Take 1 capsule (300 mg total) by mouth at bedtime. .  Multiple Vitamins-Minerals (CENTRUM PO), Take 1 tablet by mouth daily.  Marland Kitchen  omeprazole (PRILOSEC) 40 MG capsule, TAKE 1 CAPSULE BY MOUTH 30 TO 60 MINUTES BEFORE YOUR FIRST AND LAST MEALS OF THE DAY .  ondansetron (ZOFRAN) 4 MG tablet, Take 1 tablet (4 mg total) by mouth every 4 (four) hours. .  ondansetron (ZOFRAN-ODT) 4 MG disintegrating tablet, DISSOLVE 1 TABLET IN MOUTH EVERY 8 HOURS AS NEEDED FOR NAUSEA OR VOMITING PLEASE SCHEDULE A FOLLOW UP APPOINTMENT FOR FURTHER REFILLS. Marland Kitchen  venlafaxine XR (EFFEXOR XR) 75 MG 24 hr capsule, Take 1 capsule (75 mg total) by mouth daily with breakfast. .  venlafaxine XR (EFFEXOR XR) 75 MG 24 hr capsule, Take 1 capsule (75 mg total) by mouth daily with breakfast.   Reviewed prior external information including notes and imaging from  primary care provider As well as notes that were available from care everywhere and other healthcare systems.  Past medical history, social, surgical and family history all reviewed in electronic medical record.  No pertanent  information unless stated regarding to the chief complaint.   Review of Systems:  No headache, visual changes, nausea, vomiting, diarrhea, constipation, dizziness, abdominal pain, skin rash, fevers, chills, night sweats, weight loss, swollen lymph nodes,  chest pain, shortness of breath, mood changes. POSITIVE muscle aches, body aches, joint swelling and lower extremity swelling  Objective  Blood pressure (!) 150/82, pulse 85, height 5\' 4"  (1.626 m), weight 224 lb (101.6 kg), SpO2 96 %.   General: No apparent distress alert and oriented x3 mood and affect normal, dressed appropriately.  HEENT: Pupils equal, extraocular movements intact  Respiratory: Patient's speak in full sentences and does not appear short of breath  Cardiovascular: 2+ lower extremity edema, severely tender, mild erythema patient's first toe on the left foot does have some erythema.  Second toe actually has more of a bluish hue to it.  Severely tender to palpation in both of these areas.  Tenderness even to light sensation on the anterior aspect of the ankle and dorsal aspect of the foot.  Patient is tender in the legs bilaterally. Gait antalgic gait noted Back exam has significant loss of lordosis.  Patient is tender to palpation diffusely of the lumbar spine as well.  Mild positive straight leg test.  Severe worsening pain with extension of the back noted.  4-5 strength of the lower extremity with hip flexion.  Unable to do strength testing of the ankle secondary to the discomfort of the left foot.   Impression and Recommendations:     The above documentation has been reviewed and is accurate and complete Lyndal Pulley, DO

## 2020-04-08 ENCOUNTER — Ambulatory Visit: Payer: Medicare Other | Admitting: Family Medicine

## 2020-04-08 ENCOUNTER — Encounter: Payer: Self-pay | Admitting: Family Medicine

## 2020-04-08 ENCOUNTER — Ambulatory Visit (INDEPENDENT_AMBULATORY_CARE_PROVIDER_SITE_OTHER): Payer: Medicare Other

## 2020-04-08 ENCOUNTER — Encounter: Payer: Self-pay | Admitting: Neurology

## 2020-04-08 ENCOUNTER — Other Ambulatory Visit: Payer: Self-pay

## 2020-04-08 VITALS — BP 150/82 | HR 85 | Ht 64.0 in | Wt 224.0 lb

## 2020-04-08 DIAGNOSIS — F4322 Adjustment disorder with anxiety: Secondary | ICD-10-CM

## 2020-04-08 DIAGNOSIS — M4316 Spondylolisthesis, lumbar region: Secondary | ICD-10-CM

## 2020-04-08 DIAGNOSIS — M79605 Pain in left leg: Secondary | ICD-10-CM

## 2020-04-08 DIAGNOSIS — M79604 Pain in right leg: Secondary | ICD-10-CM

## 2020-04-08 DIAGNOSIS — G90521 Complex regional pain syndrome I of right lower limb: Secondary | ICD-10-CM

## 2020-04-08 DIAGNOSIS — M545 Low back pain, unspecified: Secondary | ICD-10-CM | POA: Diagnosis not present

## 2020-04-08 MED ORDER — GABAPENTIN 100 MG PO CAPS: 200.0000 mg | ORAL_CAPSULE | Freq: Two times a day (BID) | ORAL | 3 refills | Status: DC

## 2020-04-08 MED ORDER — GABAPENTIN 300 MG PO CAPS: 300.0000 mg | ORAL_CAPSULE | Freq: Every day | ORAL | 3 refills | Status: DC

## 2020-04-08 MED ORDER — VENLAFAXINE HCL ER 75 MG PO CP24: 75.0000 mg | ORAL_CAPSULE | Freq: Every day | ORAL | 0 refills | Status: DC

## 2020-04-08 NOTE — Assessment & Plan Note (Signed)
Patient initially did respond very well to the Effexor.  We will keep at the 75 mg because patient does feel like it has helped her anxiety and depression.  Once again warned about the BuSpar.  Patient has not noticed any side effects to taking them together at this time.  Did discuss okay to increase the BuSpar I would decrease the Effexor.

## 2020-04-08 NOTE — Assessment & Plan Note (Signed)
Patient is having worsening symptoms at this time.  Patient was having signs and symptoms consistent with some reflux sympathetic dystrophy previously as well.  We have not seen patient for a year but does not seem to be doing significantly better at the moment and if anything worse.  Patient has had increasing discomfort and pain.  Patient has had multiple different other comorbidities as well.  Patient has had surgical intervention on her back previously and with some more of the radicular symptoms, lower extremity swelling and increasing numbness I do feel that advanced imaging is warranted in her.  We will increase gabapentin 100 mg twice daily and 300 mg at night.  We did discuss potentially titrating off of the Effexor but patient feels like it has helped her anxiety and mood.  We will continue this medicine but patient is also on the BuSpar and warned of interactions patient understands and wants to continue the medications.  Patient also has lower extremity swelling and I do feel that ABIs as well as nerve conduction studies could be beneficial.  This will tell us if it is more peripheral neuropathy versus the lumbar radiculopathy contributing to some of the increase in lower extremity pain.  Depending on findings we will discuss potentially formal physical therapy, medication changes or the potential need for surgical intervention.

## 2020-04-08 NOTE — Patient Instructions (Addendum)
Good to see you Refilled effexor Continue gabapentin 300 mg at night Gabapentin 100 mg 2 times a day during the day For ABI 2:45pm February 25th go to the location below or call if you need to reschedule [facility]  ([facility] [employer] in [facility]) [address] [place], [place] [address] [phone]  Nerve conduction study bilateral Lumbar xray MRI lumbar Follow up with primary for blood sugars I will call you for next steps See me again in 6 weeks to see how meds are doing and to see if we need to titrate off effexor or not

## 2020-04-12 ENCOUNTER — Other Ambulatory Visit (HOSPITAL_COMMUNITY): Payer: Self-pay | Admitting: Family Medicine

## 2020-04-12 DIAGNOSIS — I739 Peripheral vascular disease, unspecified: Secondary | ICD-10-CM

## 2020-04-15 ENCOUNTER — Ambulatory Visit (HOSPITAL_COMMUNITY)
Admission: RE | Admit: 2020-04-15 | Discharge: 2020-04-15 | Disposition: A | Discharge status: Home / Self Care | Payer: Medicare Other | Source: Ambulatory Visit | Attending: Cardiology | Admitting: Cardiology

## 2020-04-15 ENCOUNTER — Other Ambulatory Visit: Payer: Self-pay

## 2020-04-15 DIAGNOSIS — M79605 Pain in left leg: Secondary | ICD-10-CM

## 2020-04-15 DIAGNOSIS — I739 Peripheral vascular disease, unspecified: Secondary | ICD-10-CM | POA: Insufficient documentation

## 2020-04-15 DIAGNOSIS — M79604 Pain in right leg: Secondary | ICD-10-CM | POA: Diagnosis not present

## 2020-04-19 ENCOUNTER — Other Ambulatory Visit: Payer: Self-pay

## 2020-04-19 DIAGNOSIS — I7090 Unspecified atherosclerosis: Secondary | ICD-10-CM

## 2020-04-22 ENCOUNTER — Telehealth: Payer: Self-pay | Admitting: Pharmacist

## 2020-04-22 NOTE — Chronic Care Management (AMB) (Signed)
Chronic Care Management Pharmacy Assistant   Name: Samantha Hinton  MRN: [id number] DOB: 14-May-1955  Reason for Encounter: Initial Questions for Pharmacist visit on 04-25-2020  Patient Questions: 1. Have you seen any other providers since your last visit?   Dr. [facility] 2. Any changes in your medications or health?   Gabapentin (NEURONTIN) 100 MG capsule: BID 3. Any side effects from any medications? 4. Do you have any symptoms or problems not managed by your medications?  Her back and neck pain 5. Any concerns about your health right now? No 6. Has your provider asked that you check blood pressure, blood sugar, or follow a special diet at home? No 7. Do you get any type of exercise regularly? No 8. Can you think of a goal you would like to reach for your health?   She would like to be more mobile  9. Do you have any problems getting your medications? No 10. Is there anything that you would like to discuss during the appointment? No  The patient was asked to please bring medications, blood pressure/ blood sugar log, and supplements to her appointment.   PCP : Burnis Medin, MD  Allergies:   Allergies  Allergen Reactions   Wellbutrin [Bupropion] Nausea And Vomiting    Medications: Outpatient Encounter Medications as of 04/22/2020  Medication Sig   albuterol (PROAIR HFA) 108 (90 Base) MCG/ACT inhaler 2 puffs every 4 hours as needed only  if your can't catch your breath   albuterol (PROVENTIL) (2.5 MG/3ML) 0.083% nebulizer solution Take 3 mLs (2.5 mg total) by nebulization every 6 (six) hours as needed for wheezing or shortness of breath.   allopurinol (ZYLOPRIM) 100 MG tablet Take 1 tablet by mouth twice daily   bisoprolol-hydrochlorothiazide (ZIAC) 5-6.25 MG tablet Take 1 tablet by mouth twice daily   Budeson-Glycopyrrol-Formoterol (BREZTRI AEROSPHERE) 160-9-4.8 MCG/ACT AERO Inhale 2 puffs into the lungs 2 (two) times daily.   busPIRone  (BUSPAR) 10 MG tablet TAKE 1 TABLET BY MOUTH THREE TIMES DAILY. MUST HAVE OFFICE VISIT FOR REFILLS   cetirizine (ZYRTEC) 10 MG tablet Take 10 mg by mouth daily.   cloNIDine (CATAPRES) 0.1 MG tablet Take 1 tablet by mouth twice daily   Colchicine 0.6 MG CAPS Take 0.6 mg by mouth 2 (two) times daily as needed.   gabapentin (NEURONTIN) 100 MG capsule Take 2 capsules (200 mg total) by mouth 2 (two) times daily.   gabapentin (NEURONTIN) 300 MG capsule 1 pill nightly   gabapentin (NEURONTIN) 300 MG capsule Take 1 capsule (300 mg total) by mouth at bedtime.   Multiple Vitamins-Minerals (CENTRUM PO) Take 1 tablet by mouth daily.    omeprazole (PRILOSEC) 40 MG capsule TAKE 1 CAPSULE BY MOUTH 30 TO 60 MINUTES BEFORE YOUR FIRST AND LAST MEALS OF THE DAY   ondansetron (ZOFRAN) 4 MG tablet Take 1 tablet (4 mg total) by mouth every 4 (four) hours.   ondansetron (ZOFRAN-ODT) 4 MG disintegrating tablet DISSOLVE 1 TABLET IN MOUTH EVERY 8 HOURS AS NEEDED FOR NAUSEA OR VOMITING PLEASE SCHEDULE A FOLLOW UP APPOINTMENT FOR FURTHER REFILLS.   venlafaxine XR (EFFEXOR XR) 75 MG 24 hr capsule Take 1 capsule (75 mg total) by mouth daily with breakfast.   venlafaxine XR (EFFEXOR XR) 75 MG 24 hr capsule Take 1 capsule (75 mg total) by mouth daily with breakfast.   No facility-administered encounter medications on file as of 04/22/2020.    Current Diagnosis: Patient Active Problem List  Diagnosis Date Noted   Morbid (severe) obesity due to excess calories ([place]) complicated by hbp 12/92/9090   Reflex sympathetic dystrophy of right lower extremity 09/14/2017   Right ankle pain 08/19/2017   Abnormal LFTs 07/07/2015   Spondylolisthesis of lumbar region 01/04/2015   Colon cancer screening 05/07/2014   Nausea without vomiting 03/20/2014   Family history of lung cancer 12/01/2013   Family history of heart disease 12/01/2013   Hyperlipidemia 12/01/2013   COPD with acute exacerbation ([place]) 08/28/2013    Other and unspecified hyperlipidemia 07/16/2013   Bruising 07/16/2013   Intentional underdosing of medication regimen by patient due to financial hardship 06/16/2013   Adjustment reaction with anxiety 06/16/2013   Adjustment disorder with anxious mood 06/16/2013   Nausea alone 06/16/2013   Left breast lump 06/16/2013   H/O cervical spine surgery 06/16/2013   COPD GOLD II 04/12/2012   Essential hypertension, benign 04/12/2012   DOE (dyspnea on exertion) 02/17/2012   Hypoxemia 02/17/2012    Goals Addressed   None      Follow-Up:  Pharmacist Review   Maia Breslow, [place] Assistant [phone]

## 2020-04-25 ENCOUNTER — Other Ambulatory Visit: Payer: Self-pay

## 2020-04-25 ENCOUNTER — Ambulatory Visit (INDEPENDENT_AMBULATORY_CARE_PROVIDER_SITE_OTHER): Payer: Medicare Other | Admitting: Pharmacist

## 2020-04-25 DIAGNOSIS — J449 Chronic obstructive pulmonary disease, unspecified: Secondary | ICD-10-CM

## 2020-04-25 DIAGNOSIS — I10 Essential (primary) hypertension: Secondary | ICD-10-CM

## 2020-04-25 NOTE — Progress Notes (Signed)
Chronic Care Management Pharmacy Note  04/25/2020 Name:  Samantha Hinton MRN:  [id number] DOB:  Jul 15, 1955  Subjective: Samantha Hinton is an 65 y.o. year old female who is a primary patient of Panosh, Standley Brooking, MD.  The CCM team was consulted for assistance with disease management and care coordination needs.    Engaged with patient face to face for initial visit in response to provider referral for pharmacy case management and/or care coordination services.   Consent to Services:  The patient was given the following information about Chronic Care Management services today, agreed to services, and gave verbal consent: 1. CCM service includes personalized support from designated clinical staff supervised by the primary care provider, including individualized plan of care and coordination with other care providers 2. 24/7 contact phone numbers for assistance for urgent and routine care needs. 3. Service will only be billed when office clinical staff spend 20 minutes or more in a month to coordinate care. 4. Only one practitioner may furnish and bill the service in a calendar month. 5.The patient may stop CCM services at any time (effective at the end of the month) by phone call to the office staff. 6. The patient will be responsible for cost sharing (co-pay) of up to 20% of the service fee (after annual deductible is met). Patient agreed to services and consent obtained.  Patient Care Team: Panosh, Standley Brooking, MD as PCP - General (Internal Medicine) Tanda Rockers, MD as Consulting Physician (Pulmonary Disease) Inda Castle, MD (Inactive) as Consulting Physician (Gastroenterology) Karie Chimera, MD as Consulting Physician (Neurosurgery) Viona Gilmore, [facility] as Pharmacist (Pharmacist)  Recent office visits: 08/04/19 Shanon Ace, MD: Patient presented for toe pain and chronic conditions follow up.  Referral placed for gynecology and ortho.   Recent consult visits: 04/15/20 Patient  presented for ABI.  04/08/20 Hulan Saas, DO (sports medicine): Patient presented for bilateral leg pain follow up. Increased gabapentin to 200 mg BID and 300 mg at bedtime. Placed referral for neurology.   10/21/19 Anthony Sar (optometry): Patient presented for eye exam. Unable to access notes.  09/17/19 Meridee Score, MD (orthopedics): Patient presented for gout follow up.  08/11/19 Joseph Pierini, MD (gynecology): Patient presented for initial visit with a history of left ovarian cyst.  08/07/19 Thornton Park, MD (gastro): Patient presented for abdominal pain and fatty liver follow up.    06/29/19 Christinia Gully, MD (pulmonary): Patient presented for dyspnea on exertion follow up. Prescribed Breztri and prednisone.   Hospital visits: None in previous 6 months  Objective:  Lab Results  Component Value Date   CREATININE 0.86 06/30/2019   BUN 14 06/30/2019   GFR 66.51 06/30/2019   GFRNONAA >60 12/27/2014   GFRAA >60 12/27/2014   NA 137 06/30/2019   K 3.9 06/30/2019   CALCIUM 9.0 06/30/2019   CO2 28 06/30/2019    Lab Results  Component Value Date/Time   HGBA1C 6.9 (H) 08/04/2019 03:49 PM   GFR 66.51 06/30/2019 09:09 AM   GFR 70.35 03/02/2019 10:10 AM    Last diabetic Eye exam: No results found for: HMDIABEYEEXA  Last diabetic Foot exam: No results found for: HMDIABFOOTEX   Lab Results  Component Value Date   CHOL 273 (H) 06/16/2019   HDL 57.20 08/12/2017   LDLCALC 116 (H) 05/24/2014   LDLDIRECT 152.0 08/12/2017   TRIG 283 (H) 06/16/2019   CHOLHDL 4 08/12/2017    Hepatic Function Latest Ref Rng & Units 08/04/2019 03/02/2019 10/14/2017  Total  Protein 6.0 - 8.3 g/dL 7.4 7.6 7.5  Albumin 3.5 - 5.2 g/dL 4.1 4.0 4.6  AST 0 - 37 U/L 89(H) 139(H) 27  ALT 0 - 35 U/L 77(H) 114(H) 33  Alk Phosphatase 39 - 117 U/L 83 86 79  Total Bilirubin 0.2 - 1.2 mg/dL 0.5 0.7 0.8  Bilirubin, Direct 0.0 - 0.3 mg/dL 0.1 0.1 0.1    Lab Results  Component Value Date/Time   TSH 1.64 06/30/2019  09:09 AM   TSH 1.38 03/02/2019 10:10 AM   FREET4 0.75 07/09/2013 09:22 AM    CBC Latest Ref Rng & Units 08/04/2019 06/30/2019 03/02/2019  WBC 4.0 - 10.5 K/uL 8.5 6.6 7.0  Hemoglobin 12.0 - 15.0 g/dL 13.7 14.0 14.1  Hematocrit 36.0 - 46.0 % 41.8 42.5 42.4  Platelets 150.0 - 400.0 K/uL 207.0 181.0 201.0    No results found for: VD25OH  Clinical ASCVD: No  The 10-year ASCVD risk score Mikey Bussing DC Jr., et al., 2013) is: 10.7%   Values used to calculate the score:     Age: 83 years     Sex: Female     Is Non-Hispanic African American: No     Diabetic: No     Tobacco smoker: No     Systolic Blood Pressure: 767 mmHg     Is BP treated: Yes     HDL Cholesterol: 57.2 mg/dL     Total Cholesterol: 273 mg/dL    Depression screen [facility] 2/9 08/12/2017 07/25/2016 07/06/2015  Decreased Interest 0 0 0  Down, Depressed, Hopeless 0 0 0  PHQ - 2 Score 0 0 0      Social History   Tobacco Use  Smoking Status Former Smoker  . Packs/day: 0.25  . Years: 44.00  . Pack years: 11.00  . Types: Cigarettes  . Quit date: 02/04/2014  . Years since quitting: 6.2  Smokeless Tobacco Never Used  Tobacco Comment   quit smoking in Dec 2015   BP Readings from Last 3 Encounters:  04/08/20 (!) 150/82  08/11/19 136/82  08/07/19 (!) 190/98   Pulse Readings from Last 3 Encounters:  04/08/20 85  08/07/19 88  08/04/19 88   Wt Readings from Last 3 Encounters:  04/08/20 224 lb (101.6 kg)  09/17/19 (!) 226 lb (102.5 kg)  08/11/19 226 lb (102.5 kg)    Assessment/Interventions: Review of patient past medical history, allergies, medications, health status, including review of consultants reports, laboratory and other test data, was performed as part of comprehensive evaluation and provision of chronic care management services.   SDOH:  (Social Determinants of Health) assessments and interventions performed: Yes SDOH Interventions   Flowsheet Row Most Recent Value  SDOH Interventions   Financial Strain  Interventions Other (Comment)  [working on patient assistance for inhaler]  Transportation Interventions Intervention Not Indicated      Patient current lives at home with a friend. Her biggest concerns right now are with pain. She reports her lower back is giving her problems despite having 4 surgeries for this. She also has some intermittent pain for the gout in her big toe. She also just had a doppler and they recommended her to see a vascular doctor.  Patient mostly cooks at home and reports she needs to follow a diet. She eats 2 meals a day and doesn't eat until the afternoon. Meals usually consist of a vegetable, starch, meat and fish or chicken. She reports that lately has had a sweet tooth and loves breads and starches.  Patient doesn't get any structured exercise but has been doing more than the usual such as going into the grocery store to get own groceries. She doesn't feel safe going for a walk because she wouldn't know what to do if she got hurt and doesn't have anyone to go with her.  Patient only sleeps 4-5 hours a night and has trouble staying asleep. She does have wheezing at night and maybe some snoring but doesn't believe she has sleep apnea although she has never had a sleep study. She does feel fatigued and does not take naps.  Patient denies any side effects with current medicines but doesn't know if she is getting relief with gabapentin. She does report a hot flash feeling after morning and night time medications. She also has some trouble affording her Breztri inhaler especially when she enters the donut hole.     CCM Care Plan  Allergies  Allergen Reactions  . Wellbutrin [Bupropion] Nausea And Vomiting    Medications Reviewed Today    Reviewed by Viona Gilmore, [facility] (Pharmacist) on 04/25/20 at 1212  Med List Status: <None>  Medication Order Taking? Sig Documenting Provider Last Dose Status Informant  albuterol (PROAIR HFA) 108 (90 Base) MCG/ACT inhaler [id number]  Yes 2 puffs every 4 hours as needed only  if your can't catch your breath Tanda Rockers, MD Taking Active   albuterol (PROVENTIL) (2.5 MG/3ML) 0.083% nebulizer solution [id number] No Take 3 mLs (2.5 mg total) by nebulization every 6 (six) hours as needed for wheezing or shortness of breath.  Patient not taking: Reported on 04/25/2020   Raylene Everts, MD Not Taking Active   allopurinol (ZYLOPRIM) 100 MG tablet [id number]  Take 1 tablet by mouth twice daily Newt Minion, MD  Active   bisoprolol-hydrochlorothiazide [facility]) 5-6.25 MG tablet [id number] Yes Take 1 tablet by mouth twice daily Tanda Rockers, MD Taking Active   Budeson-Glycopyrrol-Formoterol (BREZTRI AEROSPHERE) 160-9-4.8 MCG/ACT Hollie Salk [id number] Yes Inhale 2 puffs into the lungs 2 (two) times daily. Tanda Rockers, MD Taking Active   busPIRone (BUSPAR) 10 MG tablet [id number] Yes TAKE 1 TABLET BY MOUTH THREE TIMES DAILY. MUST HAVE OFFICE VISIT FOR REFILLS  Patient taking differently: Take 10 mg by mouth 2 (two) times daily.   Thornton Park, MD Taking Active   cloNIDine (CATAPRES) 0.1 MG tablet [id number] Yes Take 1 tablet by mouth twice daily Tanda Rockers, MD Taking Active   Colchicine 0.6 MG CAPS [id number] No Take 0.6 mg by mouth 2 (two) times daily as needed.  Patient not taking: Reported on 04/25/2020   Newt Minion, MD Not Taking Active   gabapentin (NEURONTIN) 100 MG capsule [id number] Yes Take 2 capsules (200 mg total) by mouth 2 (two) times daily.  Patient taking differently: Take 100 mg by mouth 2 (two) times daily.   Lyndal Pulley, DO Taking Active   gabapentin (NEURONTIN) 300 MG capsule [id number] Yes 1 pill nightly Hulan Saas M, DO Taking Active   gabapentin (NEURONTIN) 300 MG capsule [id number] Yes Take 1 capsule (300 mg total) by mouth at bedtime. Lyndal Pulley, DO Taking Active   loratadine (CLARITIN) 10 MG tablet [id number] Yes Take 10 mg by mouth daily. [provider] Taking Active   Multiple  Vitamins-Minerals (CENTRUM PO) [id number] No Take 1 tablet by mouth daily.   Patient not taking: Reported on 04/25/2020   [provider] Not Taking Active Self  omeprazole (PRILOSEC) 40 MG capsule [id number] Yes TAKE 1 CAPSULE  BY MOUTH 30 TO 60 MINUTES BEFORE YOUR FIRST AND LAST MEALS OF THE DAY Tanda Rockers, MD Taking Active   ondansetron (ZOFRAN-ODT) 4 MG disintegrating tablet [id number] Yes DISSOLVE 1 TABLET IN MOUTH EVERY 8 HOURS AS NEEDED FOR NAUSEA OR VOMITING PLEASE SCHEDULE A FOLLOW UP APPOINTMENT FOR FURTHER REFILLS. Thornton Park, MD Taking Active   venlafaxine XR (EFFEXOR XR) 75 MG 24 hr capsule [id number] Yes Take 1 capsule (75 mg total) by mouth daily with breakfast. Lyndal Pulley, DO Taking Active           Patient Active Problem List   Diagnosis Date Noted  . Morbid (severe) obesity due to excess calories ([place]) complicated by hbp 79/89/2119  . Reflex sympathetic dystrophy of right lower extremity 09/14/2017  . Right ankle pain 08/19/2017  . Abnormal LFTs 07/07/2015  . Spondylolisthesis of lumbar region 01/04/2015  . Colon cancer screening 05/07/2014  . Nausea without vomiting 03/20/2014  . Family history of lung cancer 12/01/2013  . Family history of heart disease 12/01/2013  . Hyperlipidemia 12/01/2013  . COPD with acute exacerbation ([place]) 08/28/2013  . Other and unspecified hyperlipidemia 07/16/2013  . Bruising 07/16/2013  . Intentional underdosing of medication regimen by patient due to financial hardship 06/16/2013  . Adjustment reaction with anxiety 06/16/2013  . Adjustment disorder with anxious mood 06/16/2013  . Nausea alone 06/16/2013  . Left breast lump 06/16/2013  . H/O cervical spine surgery 06/16/2013  . COPD GOLD II 04/12/2012  . Essential hypertension, benign 04/12/2012  . DOE (dyspnea on exertion) 02/17/2012  . Hypoxemia 02/17/2012    Immunization History  Administered Date(s) Administered  . Influenza Split 03/19/2012  .  Influenza,inj,Quad PF,6+ Mos 12/01/2013, 10/19/2014, 03/13/2016, 05/03/2017, 10/14/2017  . PFIZER(Purple Top)SARS-COV-2 Vaccination 05/14/2019, 06/04/2019  . Pneumococcal Conjugate-13 07/16/2013  . Pneumococcal Polysaccharide-23 07/25/2016  . Tdap 05/24/2014    Conditions to be addressed/monitored:  Hypertension, Hyperlipidemia, Diabetes, COPD, Depression, Anxiety, Gout, Allergic Rhinitis and nerve pain  Care Plan : Samantha Hinton  Updates made by Viona Gilmore, [place] since 04/25/2020 12:00 AM    Problem: Problem: Hypertension, Pre-diabetes, COPD, Depression, Anxiety, Gout, Allergic Rhinitis and nerve pain     Long-Range Goal: Patient-Specific Goal   Start Date: 04/25/2020  Expected End Date: 04/25/2021  This Visit's Progress: On track  Priority: High  Note:   Current Barriers:  . Unable to independently monitor therapeutic efficacy . Does not adhere to prescribed medication regimen  Pharmacist Clinical Goal(s):  Marland Kitchen Over the next 120 days, patient will verbalize ability to afford treatment regimen . achieve adherence to monitoring guidelines and medication adherence to achieve therapeutic efficacy . maintain control of blood pressure as evidenced by home blood pressure monitoring  through collaboration with PharmD and provider.   Interventions: . 1:1 collaboration with Panosh, Standley Brooking, MD regarding development and update of comprehensive plan of care as evidenced by provider attestation and co-signature . Inter-disciplinary care team collaboration (see longitudinal plan of care) . Comprehensive medication review performed; medication list updated in electronic medical record  Hypertension (BP goal <140/90) -Uncontrolled -Current treatment: . Bisoprolol-HCTZ 5-6.25 mg 1 tablet twice daily . Clonidine 0.1 mg 1 tablet twice daily -Medications previously tried: none -Current home readings: does not check at home -Current dietary habits: limits salt intake and uses himalayan  salt to cut down on sodium -Current exercise habits: not able to exercise due to pain -Denies hypotensive/hypertensive symptoms -Educated on Exercise goal of 150 minutes per week; Importance of home  blood pressure monitoring; -Counseled to monitor BP at home weekly, document, and provide log at future appointments -Counseled on diet and exercise extensively Recommended to continue current medication  Elevated A1c (A1c goal <7%) -Controlled -Current medications: . No medications -Medications previously tried: none  -Current home glucose readings -Denies hypoglycemic/hyperglycemic symptoms -Current meal patterns:  . breakfast: did not discuss . lunch: did not discuss . dinner: did not discuss . snacks: did not discuss . drinks: did not discuss -Current exercise: not able to exercise due to pain -Educated onA1c and blood sugar goals; Exercise goal of 150 minutes per week; Carbohydrate counting and/or plate method -Counseled to check feet daily and get yearly eye exams -Counseled on diet and exercise extensively Provided healthy plate handout  COPD (Goal: control symptoms and prevent exacerbations) -Controlled -Current treatment  . Breztri 160-9-4.8 mcg/act inhale 2 puffs twice daily . Albuterol nebulizer as needed (for excerbations) . Albuterol 108 mcg/act 2 puffs every 4 hours as needed -Medications previously tried: n/a  -Gold Grade: Gold 2 (FEV1 50-79%) -Current COPD Classification:  B (high sx, <2 exacerbations/yr) -MMRC/CAT score: 2 (06/29/19) -Pulmonary function testing: n/a -Exacerbations requiring treatment in last 6 months: none -Patient reports consistent use of maintenance inhaler -Frequency of rescue inhaler use: not often (not every day) -Counseled on Proper inhaler technique; Benefits of consistent maintenance inhaler use -Recommended to continue current medication Assessed patient finances. Filled out patient assistance application for  [employer].  Depression/Anxiety (Goal: minimize symptoms) -Uncontrolled -Current treatment: . Venlafaxine 1 capsule daily with breakfast (pain and depression)  . Buspirone 10 mg 1 tablet three times daily - taking twice daily -Medications previously tried/failed: Wellbutrin (N/V), Cymbalta, Xanax -PHQ9: n/a -GAD7: n/a -Educated on Benefits of medication for symptom control Benefits of cognitive-behavioral therapy with or without medication -Recommended to continue current medication Patient declined CBT at this time  Gout (Goal: uric acid < 6 and prevent flare ups) -Uncontrolled -Current treatment  . Allopurinol 100 mg 1 tablet twice daily - not taking due to denied refills . Colchicine as needed for flare ups -Medications previously tried: none  -Counseled on low purine diet Reached out to Dr. Jess Barters office to ask about continuation of allopurinol  Nerve pain (Goal: minimize pain) -Uncontrolled -Current treatment  . Gabapentin 100 mg 2 capsules twice daily - taking 1 capsule twice daily . Gabapentin 300 mg 1 capsule at bedtime -Medications previously tried: none  -Recommended to continue current medication Educated on wide therapeutic range for gabapentin Sent message to clarify directions for 100 mg dose of gabapentin  GERD/GI upsert (Goal: minimize symptoms of upset stomach or acid reflux) -Uncontrolled -Current treatment  . Omeprazole 40 mg 1 capsule before eating - morning and at night . Ondansetron 4 mg as needed  -Medications previously tried: none -Recommended to continue current medication Counseled on switching omeprazole to before dinner instead of bedtime for additional relief Educated on non-pharmacologic management of symptoms such as elevating the head of your bed, avoiding eating 2-3 hours before bed, avoiding triggering foods such as acidic, spicy, or fatty foods, eating smaller meals, and wearing clothes that are loose around the waist  Allergic rhinitis  (Goal: minimize symptoms of allergies) -Controlled -Current treatment  . Loratadine 10 mg 1 tablet daily -Medications previously tried: Zyrtec  -Recommended to continue current medication   Health Maintenance -Vaccine gaps: shingles -Current therapy:  . Multivitamin 1 tablet daily - not taking . Vitamin E - not taking . Vitamin D taking lowest dose - not taking .  Cinnamon -Educated on Cost vs benefit of each product must be carefully weighed by individual consumer -Patient is satisfied with current therapy and denies issues -Counseled on avoiding additional vitamin E supplement with use of multivitamin Recommended supplementation with 1000 units of vitamin D daily  Patient Goals/Self-Care Activities . Over the next 12- days, patient will:  - take medications as prescribed check blood pressure weekly, document, and provide at future appointments  Follow Up Plan: Face to Face appointment with care management team member scheduled for:  4 months      Medication Assistance: Application for [facility]  medication assistance program. in process.  Anticipated assistance start date 05/26/2020.  See plan of care for additional detail.    Patient's preferred pharmacy is:  [facility] [address], [place] - [address] [place] [address] Phone: [phone] Fax: [phone]  Uses pill box? Yes Pt endorses 100% compliance  We discussed: Verbal consent obtained for [facility] enhanced pharmacy services (medication synchronization, adherence packaging, delivery coordination). A medication sync plan was created to allow patient to get all medications delivered once every 30 to 90 days per patient preference. Patient understands they have freedom to choose pharmacy and clinical pharmacist will coordinate care between all prescribers and [facility].  Patient decided to: Utilize [facility] for medication synchronization,  packaging and delivery  Care Plan and Follow Up Patient Decision:  Patient agrees to Care Plan and Follow-up.  Plan: Face to Face appointment with care management team member scheduled for: 4 months  Jeni Salles, PharmD [facility] Clinical Pharmacist [place] [facility] [place] [phone]   3-4 months - in person

## 2020-04-29 ENCOUNTER — Telehealth: Payer: Self-pay | Admitting: Internal Medicine

## 2020-04-29 NOTE — Patient Instructions (Addendum)
Hi Samantha Hinton,  It was great to get to meet you in person! Below is a summary of some of the topics we discussed. Don't forget to look into getting your shingles shot at the pharmacy and make sure to start checking your blood pressures at home as well.  Please reach out to me if you have any questions or need anything before our follow up!  Best, Maddie  Jeni Salles, PharmD, [facility] [phone]    Visit Information  Goals Addressed            This Visit's Progress   . Manage My Medicine       Timeframe:  Long-Range Goal Priority:  High Start Date:                             Expected End Date:                       Follow Up Date 04/25/2021    - call for medicine refill 2 or 3 days before it runs out - keep a list of all the medicines I take; vitamins and herbals too - use a pillbox to sort medicine    Why is this important?   . These steps will help you keep on track with your medicines.        Patient Care Plan: CCM Pharmacy Care Plan    Problem Identified: Problem: Hypertension, Pre-diabetes, COPD, Depression, Anxiety, Gout, Allergic Rhinitis and nerve pain     Long-Range Goal: Patient-Specific Goal   Start Date: 04/25/2020  Expected End Date: 04/25/2021  This Visit's Progress: On track  Priority: High  Note:   Current Barriers:  . Unable to independently monitor therapeutic efficacy . Does not adhere to prescribed medication regimen  Pharmacist Clinical Goal(s):  Marland Kitchen Over the next 120 days, patient will verbalize ability to afford treatment regimen . achieve adherence to monitoring guidelines and medication adherence to achieve therapeutic efficacy . maintain control of blood pressure as evidenced by home blood pressure monitoring  through collaboration with PharmD and provider.   Interventions: . 1:1 collaboration with Panosh, Standley Brooking, MD regarding development and update of comprehensive plan of care as  evidenced by provider attestation and co-signature . Inter-disciplinary care team collaboration (see longitudinal plan of care) . Comprehensive medication review performed; medication list updated in electronic medical record  Hypertension (BP goal <140/90) -Uncontrolled -Current treatment: . Bisoprolol-HCTZ 5-6.25 mg 1 tablet twice daily . Clonidine 0.1 mg 1 tablet twice daily -Medications previously tried: none -Current home readings: does not check at home -Current dietary habits: limits salt intake and uses himalayan salt to cut down on sodium -Current exercise habits: not able to exercise due to pain -Denies hypotensive/hypertensive symptoms -Educated on Exercise goal of 150 minutes per week; Importance of home blood pressure monitoring; -Counseled to monitor BP at home weekly, document, and provide log at future appointments -Counseled on diet and exercise extensively Recommended to continue current medication  Elevated A1c (A1c goal <7%) -Controlled -Current medications: . No medications -Medications previously tried: none  -Current home glucose readings -Denies hypoglycemic/hyperglycemic symptoms -Current meal patterns:  . breakfast: did not discuss . lunch: did not discuss . dinner: did not discuss . snacks: did not discuss . drinks: did not discuss -Current exercise: not able to exercise due to pain -Educated onA1c and blood sugar goals; Exercise goal of 150 minutes per  week; Carbohydrate counting and/or plate method -Counseled to check feet daily and get yearly eye exams -Counseled on diet and exercise extensively Provided healthy plate handout  COPD (Goal: control symptoms and prevent exacerbations) -Controlled -Current treatment  . Breztri 160-9-4.8 mcg/act inhale 2 puffs twice daily . Albuterol nebulizer as needed (for excerbations) . Albuterol 108 mcg/act 2 puffs every 4 hours as needed -Medications previously tried: n/a  -Gold Grade: Gold 2 (FEV1  50-79%) -Current COPD Classification:  B (high sx, <2 exacerbations/yr) -MMRC/CAT score: 2 (06/29/19) -Pulmonary function testing: n/a -Exacerbations requiring treatment in last 6 months: none -Patient reports consistent use of maintenance inhaler -Frequency of rescue inhaler use: not often (not every day) -Counseled on Proper inhaler technique; Benefits of consistent maintenance inhaler use -Recommended to continue current medication Assessed patient finances. Filled out patient assistance application for [employer].  Depression/Anxiety (Goal: minimize symptoms) -Uncontrolled -Current treatment: . Venlafaxine 1 capsule daily with breakfast (pain and depression)  . Buspirone 10 mg 1 tablet three times daily - taking twice daily -Medications previously tried/failed: Wellbutrin (N/V), Cymbalta, Xanax -PHQ9: n/a -GAD7: n/a -Educated on Benefits of medication for symptom control Benefits of cognitive-behavioral therapy with or without medication -Recommended to continue current medication Patient declined CBT at this time  Gout (Goal: uric acid < 6 and prevent flare ups) -Uncontrolled -Current treatment  . Allopurinol 100 mg 1 tablet twice daily - not taking due to denied refills . Colchicine as needed for flare ups -Medications previously tried: none  -Counseled on low purine diet Reached out to Dr. Jess Barters office to ask about continuation of allopurinol  Nerve pain (Goal: minimize pain) -Uncontrolled -Current treatment  . Gabapentin 100 mg 2 capsules twice daily - taking 1 capsule twice daily . Gabapentin 300 mg 1 capsule at bedtime -Medications previously tried: none  -Recommended to continue current medication Educated on wide therapeutic range for gabapentin Sent message to clarify directions for 100 mg dose of gabapentin  GERD/GI upsert (Goal: minimize symptoms of upset stomach or acid reflux) -Uncontrolled -Current treatment  . Omeprazole 40 mg 1 capsule before eating -  morning and at night . Ondansetron 4 mg as needed  -Medications previously tried: none -Recommended to continue current medication Counseled on switching omeprazole to before dinner instead of bedtime for additional relief Educated on non-pharmacologic management of symptoms such as elevating the head of your bed, avoiding eating 2-3 hours before bed, avoiding triggering foods such as acidic, spicy, or fatty foods, eating smaller meals, and wearing clothes that are loose around the waist  Allergic rhinitis (Goal: minimize symptoms of allergies) -Controlled -Current treatment  . Loratadine 10 mg 1 tablet daily -Medications previously tried: Zyrtec  -Recommended to continue current medication   Health Maintenance -Vaccine gaps: shingles -Current therapy:  . Multivitamin 1 tablet daily - not taking . Vitamin E - not taking . Vitamin D taking lowest dose - not taking . Cinnamon -Educated on Cost vs benefit of each product must be carefully weighed by individual consumer -Patient is satisfied with current therapy and denies issues -Counseled on avoiding additional vitamin E supplement with use of multivitamin Recommended supplementation with 1000 units of vitamin D daily  Patient Goals/Self-Care Activities . Over the next 12- days, patient will:  - take medications as prescribed check blood pressure weekly, document, and provide at future appointments  Follow Up Plan: Face to Face appointment with care management team member scheduled for:  4 months      Samantha Hinton was given information about  Chronic Care Management services today including:  1. CCM service includes personalized support from designated clinical staff supervised by her physician, including individualized plan of care and coordination with other care providers 2. 24/7 contact phone numbers for assistance for urgent and routine care needs. 3. Standard insurance, coinsurance, copays and deductibles apply for chronic  care management only during months in which we provide at least 20 minutes of these services. Most insurances cover these services at 100%, however patients may be responsible for any copay, coinsurance and/or deductible if applicable. This service may help you avoid the need for more expensive face-to-face services. 4. Only one practitioner may furnish and bill the service in a calendar month. 5. The patient may stop CCM services at any time (effective at the end of the month) by phone call to the office staff.  Patient agreed to services and verbal consent obtained.   The patient verbalized understanding of instructions, educational materials, and care plan provided today and agreed to receive a mailed copy of patient instructions, educational materials, and care plan.  Telephone follow up appointment with pharmacy team member scheduled for: 4 months  Viona Gilmore, [facility]  [email or web address]">  DASH Eating Plan DASH stands for Dietary Approaches to Stop Hypertension. The DASH eating plan is a healthy eating plan that has been shown to:  Reduce high blood pressure (hypertension).  Reduce your risk for type 2 diabetes, heart disease, and stroke.  Help with weight loss. What are tips for following this plan? Reading food labels  Check food labels for the amount of salt (sodium) per serving. Choose foods with less than 5 percent of the Daily Value of sodium. Generally, foods with less than 300 milligrams (mg) of sodium per serving fit into this eating plan.  To find whole grains, look for the word "whole" as the first word in the ingredient list. Shopping  Buy products labeled as "low-sodium" or "no salt added."  Buy fresh foods. Avoid canned foods and pre-made or frozen meals. Cooking  Avoid adding salt when cooking. Use salt-free seasonings or herbs instead of table salt or sea salt. Check with your health care provider or pharmacist  before using salt substitutes.  Do not fry foods. Cook foods using healthy methods such as baking, boiling, grilling, roasting, and broiling instead.  Cook with heart-healthy oils, such as olive, canola, avocado, soybean, or sunflower oil. Meal planning  Eat a balanced diet that includes: ? 4 or more servings of fruits and 4 or more servings of vegetables each day. Try to fill one-half of your plate with fruits and vegetables. ? 6-8 servings of whole grains each day. ? Less than 6 oz (170 g) of lean meat, poultry, or fish each day. A 3-oz (85-g) serving of meat is about the same size as a deck of cards. One egg equals 1 oz (28 g). ? 2-3 servings of low-fat dairy each day. One serving is 1 cup (237 mL). ? 1 serving of nuts, seeds, or beans 5 times each week. ? 2-3 servings of heart-healthy fats. Healthy fats called omega-3 fatty acids are found in foods such as walnuts, flaxseeds, fortified milks, and eggs. These fats are also found in cold-water fish, such as sardines, salmon, and mackerel.  Limit how much you eat of: ? Canned or prepackaged foods. ? Food that is high in trans fat, such as some fried foods. ? Food that is high in saturated fat, such as fatty meat. ? Desserts and other sweets, sugary drinks,  and other foods with added sugar. ? Full-fat dairy products.  Do not salt foods before eating.  Do not eat more than 4 egg yolks a week.  Try to eat at least 2 vegetarian meals a week.  Eat more home-cooked food and less restaurant, buffet, and fast food.   Lifestyle  When eating at a restaurant, ask that your food be prepared with less salt or no salt, if possible.  If you drink alcohol: ? Limit how much you use to:  0-1 drink a day for women who are not pregnant.  0-2 drinks a day for men. ? Be aware of how much alcohol is in your drink. In the [place] one drink equals one 12 oz bottle of beer (355 mL), one 5 oz glass of wine (148 mL), or one 1 oz glass of hard liquor (44  mL). General information  Avoid eating more than 2,300 mg of salt a day. If you have hypertension, you may need to reduce your sodium intake to 1,500 mg a day.  Work with your health care provider to maintain a healthy body weight or to lose weight. Ask what an ideal weight is for you.  Get at least 30 minutes of exercise that causes your heart to beat faster (aerobic exercise) most days of the week. Activities may include walking, swimming, or biking.  Work with your health care provider or dietitian to adjust your eating plan to your individual calorie needs. What foods should I eat? Fruits All fresh, dried, or frozen fruit. Canned fruit in natural juice (without added sugar). Vegetables Fresh or frozen vegetables (raw, steamed, roasted, or grilled). Low-sodium or reduced-sodium tomato and vegetable juice. Low-sodium or reduced-sodium tomato sauce and tomato paste. Low-sodium or reduced-sodium canned vegetables. Grains Whole-grain or whole-wheat bread. Whole-grain or whole-wheat pasta. Brown rice. [place]row. Bulgur. Whole-grain and low-sodium cereals. Pita bread. Low-fat, low-sodium crackers. Whole-wheat flour tortillas. Meats and other proteins Skinless chicken or [place]. Ground chicken or [place]. Pork with fat trimmed off. Fish and seafood. Egg whites. Dried beans, peas, or lentils. Unsalted nuts, nut butters, and seeds. Unsalted canned beans. Lean cuts of beef with fat trimmed off. Low-sodium, lean precooked or cured meat, such as sausages or meat loaves. Dairy Low-fat (1%) or fat-free (skim) milk. Reduced-fat, low-fat, or fat-free cheeses. Nonfat, low-sodium ricotta or cottage cheese. Low-fat or nonfat yogurt. Low-fat, low-sodium cheese. Fats and oils Soft margarine without trans fats. Vegetable oil. Reduced-fat, low-fat, or light mayonnaise and salad dressings (reduced-sodium). Canola, safflower, olive, avocado, soybean, and sunflower oils. Avocado. Seasonings and  condiments Herbs. Spices. Seasoning mixes without salt. Other foods Unsalted popcorn and pretzels. Fat-free sweets. The items listed above may not be a complete list of foods and beverages you can eat. Contact a dietitian for more information. What foods should I avoid? Fruits Canned fruit in a light or heavy syrup. Fried fruit. Fruit in cream or butter sauce. Vegetables Creamed or fried vegetables. Vegetables in a cheese sauce. Regular canned vegetables (not low-sodium or reduced-sodium). Regular canned tomato sauce and paste (not low-sodium or reduced-sodium). Regular tomato and vegetable juice (not low-sodium or reduced-sodium). Angie Fava. Olives. Grains Baked goods made with fat, such as croissants, muffins, or some breads. Dry pasta or rice meal packs. Meats and other proteins Fatty cuts of meat. Ribs. Fried meat. Berniece Salines. Bologna, salami, and other precooked or cured meats, such as sausages or meat loaves. Fat from the back of a pig (fatback). Bratwurst. Salted nuts and seeds. Canned beans with added salt.  Canned or smoked fish. Whole eggs or egg yolks. Chicken or [place] with skin. Dairy Whole or 2% milk, cream, and half-and-half. Whole or full-fat cream cheese. Whole-fat or sweetened yogurt. Full-fat cheese. Nondairy creamers. Whipped toppings. Processed cheese and cheese spreads. Fats and oils Butter. Stick margarine. Lard. Shortening. Ghee. Bacon fat. Tropical oils, such as coconut, palm kernel, or palm oil. Seasonings and condiments Onion salt, garlic salt, seasoned salt, table salt, and sea salt. Worcestershire sauce. Tartar sauce. Barbecue sauce. Teriyaki sauce. Soy sauce, including reduced-sodium. Steak sauce. Canned and packaged gravies. Fish sauce. Oyster sauce. Cocktail sauce. Store-bought horseradish. Ketchup. Mustard. Meat flavorings and tenderizers. Bouillon cubes. Hot sauces. Pre-made or packaged marinades. Pre-made or packaged taco seasonings. Relishes. Regular salad  dressings. Other foods Salted popcorn and pretzels. The items listed above may not be a complete list of foods and beverages you should avoid. Contact a dietitian for more information. Where to find more information  National Heart, Lung, and Blood Institute: https://wilson-eaton.com/  American Heart Association: www.heart.org  Academy of Nutrition and Dietetics: www.eatright.[place]: www.kidney.org Summary  The DASH eating plan is a healthy eating plan that has been shown to reduce high blood pressure (hypertension). It may also reduce your risk for type 2 diabetes, heart disease, and stroke.  When on the DASH eating plan, aim to eat more fresh fruits and vegetables, whole grains, lean proteins, low-fat dairy, and heart-healthy fats.  With the DASH eating plan, you should limit salt (sodium) intake to 2,300 mg a day. If you have hypertension, you may need to reduce your sodium intake to 1,500 mg a day.  Work with your health care provider or dietitian to adjust your eating plan to your individual calorie needs. This information is not intended to replace advice given to you by your health care provider. Make sure you discuss any questions you have with your health care provider. Document Revised: 01/09/2019 Document Reviewed: 01/09/2019 Elsevier Patient Education  2021 [employer].

## 2020-04-29 NOTE — Telephone Encounter (Signed)
Pt overdue for appt here- LMTCB

## 2020-04-30 ENCOUNTER — Other Ambulatory Visit: Payer: Medicare Other

## 2020-05-03 ENCOUNTER — Other Ambulatory Visit: Payer: Self-pay | Admitting: Internal Medicine

## 2020-05-03 MED ORDER — BREZTRI AEROSPHERE 160-9-4.8 MCG/ACT IN AERO: 2.0000 | INHALATION_SPRAY | Freq: Two times a day (BID) | RESPIRATORY_TRACT | 3 refills | Status: DC

## 2020-05-03 NOTE — Telephone Encounter (Signed)
lmtcb for pt.  

## 2020-05-04 ENCOUNTER — Other Ambulatory Visit: Payer: Self-pay

## 2020-05-04 MED ORDER — VENLAFAXINE HCL ER 75 MG PO CP24: 75.0000 mg | ORAL_CAPSULE | Freq: Every day | ORAL | 3 refills | Status: DC

## 2020-05-04 MED ORDER — GABAPENTIN 100 MG PO CAPS: 100.0000 mg | ORAL_CAPSULE | Freq: Two times a day (BID) | ORAL | 0 refills | Status: DC

## 2020-05-04 NOTE — Telephone Encounter (Signed)
I spoke with the pt 05/03/20 and she is scheduled to see MW tomorrow 05/05/20

## 2020-05-05 ENCOUNTER — Ambulatory Visit: Payer: Medicare Other | Admitting: Internal Medicine

## 2020-05-05 ENCOUNTER — Encounter: Payer: Self-pay | Admitting: Internal Medicine

## 2020-05-05 ENCOUNTER — Other Ambulatory Visit: Payer: Self-pay

## 2020-05-05 DIAGNOSIS — J449 Chronic obstructive pulmonary disease, unspecified: Secondary | ICD-10-CM | POA: Diagnosis not present

## 2020-05-05 DIAGNOSIS — I10 Essential (primary) hypertension: Secondary | ICD-10-CM | POA: Diagnosis not present

## 2020-05-05 MED ORDER — BREZTRI AEROSPHERE 160-9-4.8 MCG/ACT IN AERO: 2.0000 | INHALATION_SPRAY | Freq: Two times a day (BID) | RESPIRATORY_TRACT | 0 refills | Status: DC

## 2020-05-05 MED ORDER — ALBUTEROL SULFATE HFA 108 (90 BASE) MCG/ACT IN AERS: INHALATION_SPRAY | RESPIRATORY_TRACT | 11 refills | Status: DC

## 2020-05-05 MED ORDER — PREDNISONE 10 MG PO TABS: ORAL_TABLET | ORAL | 11 refills | Status: DC

## 2020-05-05 NOTE — Assessment & Plan Note (Signed)
Quit smoking 01/2014   - PFT's 06/12/2012 FEV1  1.41 (60%) ratio 64 and no better p B2  And DLCO 51 corrects to 94 % - 06/12/2012 refer to Rehab - 08/02/2012 started tudorza > improved - 06/19/2013  >  Started symbicort 2 bid  - 08/11/2018    change symb to bevespi 2 bid And max gerd rx   Alpha one AT screen 08/11/2018  MM  Level  145  - 09/10/2018   continue bevespi - 06/29/2019  After extensive coaching inhaler device,  effectiveness =   90% change to breztri  - 05/05/2020 added pred x 6 days as Plan D    Group D in terms of symptom/risk and laba/lama/ICS  therefore appropriate rx at this point >>>  Continue breztri 2bid and prn saba with pred x 6 days as "plan D" see avs for instructions unique to this ov    Re saba: I spent extra time with pt today reviewing appropriate use of albuterol for prn use on exertion with the following points: 1) saba is for relief of sob that does not improve by walking a slower pace or resting but rather if the pt does not improve after trying this first. 2) If the pt is convinced, as many are, that saba helps recover from activity faster then it's easy to tell if this is the case by re-challenging : ie stop, take the inhaler, then p 5 minutes try the exact same activity (intensity of workload) that just caused the symptoms and see if they are substantially diminished or not after saba 3) if there is an activity that reproducibly causes the symptoms, try the saba 15 min before the activity on alternate days   If in fact the saba really does help, then fine to continue to use it prn but advised may need to look closer at the maintenance regimen being used to achieve better control of airways disease with exertion.    F/u q 6 m          Each maintenance medication was reviewed in detail including emphasizing most importantly the difference between maintenance and prns and under what circumstances the prns are to be triggered using an action plan format where  appropriate.  Total time for H and P, chart review, counseling, reviewing hfa device(s) and generating customized AVS unique to this office visit / same day charting = 26 min

## 2020-05-05 NOTE — Patient Instructions (Addendum)
Plan A = Automatic = Always=   Breztri Take 2 puffs first thing in am and then another 2 puffs about 12 hours later.    Plan B = Backup (to supplement plan A, not to replace it) Only use your albuterol inhaler as a rescue medication to be used if you can't catch your breath by resting or doing a relaxed purse lip breathing pattern.  - The less you use it, the better it will work when you need it. - Ok to use the inhaler up to 2 puffs  every 4 hours if you must but call for appointment if use goes up over your usual need - Don't leave home without it !!  (think of it like the spare tire for your car)   Plan C = Crisis (instead of Plan B but only if Plan B stops working) - only use your albuterol nebulizer if you first try Plan B and it fails to help > ok to use the nebulizer up to every 4 hours but if start needing it regularly call for immediate appointment   Plan D = Deltasone  If getting to plan C and not doing better  >  Prednisone 10 mg take  4 each am x 2 days,   2 each am x 2 days,  1 each am x 2 days and stop   Please schedule a follow up visit in  6  months but call sooner if needed

## 2020-05-05 NOTE — Assessment & Plan Note (Signed)
Changed from acei to bystolic 08/04/35 - increased ziac to bid 09/10/2018  - 12/29/2018 added clonidine 0.1 bid for chronic pain/hbp   Not optimally controlled on present regimen. I reviewed this with the patient and emphasized importance of follow-up with primary care.

## 2020-05-05 NOTE — Addendum Note (Signed)
Addended by: Rosana Berger on: 05/05/2020 10:29 AM   Modules accepted: Orders

## 2020-05-05 NOTE — Progress Notes (Signed)
Subjective:    Patient ID: Samantha Hinton, female    DOB: [birth year] MRN: [id number]    Brief patient profile:  45 yowf MM quit smoking 01/2014  with GOLD II COPD  baseline doe x best days able to do extensive shopping s 02 better with albuterol when had access to it then admitted [facility]:   Admit Date: 02/17/2012  PRIMARY DISCHARGE DIAGNOSIS:  URI (upper respiratory infection)  COPD exacerbation  SOB (shortness of breath)  Hypoxemia  UTI (lower urinary tract infection)  Diarrhea  Tobacco abuse    04/10/2012 1st pulmonary ov  On ACEI cc doe x 5 steps on neb and albuterol 7 x daily with persistent sense of chest tightness no better since discharge rec Stop lisinopril and start bystolic 5 mg one daily Start dulera 100 Take 2 puffs first thing in am and then another 2 puffs about 12 hours later.  Plan B  Only use your albuterol inhaler, plan C is your nebulizer) as a rescue medication   GERD diet   05/08/2012 f/u ov/Grabiela Wohlford cc breathing much better on dulera 678 and bystolic no need for saba or neb despite head and chest cold since last ov. rec Start bisoprolol 5 mg daily in place of bystolic - break in half daily if too strong dulera 200 Take 2 puffs first thing in am and then another 2 puffs about 12 hours later.  06/12/2012 f/u ov/Binnie Droessler re copd Chief Complaint  Patient presents with  . Follow-up    Breathing some worse for the past 2 wks, relates to allergy season. Cough has improve some.    takes zyrtec year round, very nervous using saba 1-2 x daily but typically not at rest or hs.  Minimal impact on activity tolerance though. rec Zyrtec should be used one daily as needed for itching sneezing runny nose Blow out through through the nose Please see patient coordinator before you leave today  to schedule pulmonary rehab > placed on wt list  Prednisone 10 mg take  4 each am x 2 days,   2 each am x 2 days,  1 each am x2days and stop   08/01/2012 f/u ov/Bolivar Koranda still smoking Chief  Complaint  Patient presents with  . Follow-up    Pt states that her breathing is unchanged since last visit. Cough is some better.   still using hfa albuterol before  And after dulera but  not needing nebulizer alb and not limited from desired activities - still on wt list for rehab. Not convinced dulera really helping her or reducing perceived need for saba and no real change since ran out x > 2 weeks prior to OV   rec tudorza one twice daily on a trial basis Only use your albuterol (ventolin 1st, nebulizer is second)     08/13/2014 f/u ov/Jamesyn Moorefield re: GOLD II copd/ quit smoking Dec 2015 / not using symb regularly  Chief Complaint  Patient presents with  . Follow-up    Pt here for medication refill. She states her breathing hsa been doing well. She uses albuterol inhaler 1 x daily on average. She rarely uses nebs.   no real change off symbicort up to a month at a time  Only sob overdoes/ sleeps fine  rec Ok to change symbicort to take  as needed Take 2 puffs first thing in am and then another 2 puffs about 12 hours later.  Only use your albuterol as a rescue medication     08/11/2018 acute extended ov/Jes Costales  re:  Re-establish GOLD II copd/ worse sob still on symb 160 2bid  Chief Complaint  Patient presents with  . Pulmonary Consult    Self referral. Pt c/o increased SOB since Spring 2020. She gets winded walking room to room at home.   MMRC3 = can't walk 100 yards even at a slow pace at a flat grade s stopping due to sob  Progressive worse no variability since December 2019 Hs = gurgly on back / overt hb on just prilosec 20 mg not ac No cough  Mild leg swelling   Neb but never before ex  rec Plan A = Automatic = Bevespi Take 2 puffs first thing in am and then another 2 puffs about 12 hours later.  Work on inhaler technique:   Plan B = Backup Only use your albuterol inhaler as a rescue medication to be used if you can't catch your breath Plan C = Crisis - only use your albuterol  nebulizer if you first try Plan B and it fails to help > ok to use the nebulizer up to every 4 hours but if start needing it regularly call for immediate appointment Omeprazole 40 mg Take 30- 60 min before your first and last meals of the day  GERD diet    09/10/2018  f/u ov/Cyndee Giammarco re:  GOLD II maint on bevespi 2 bid improved  Chief Complaint  Patient presents with  . Follow-up    Breathing has improved some. She does not have a rescue inhaler.    Dyspnea:  MMRC2 = can't walk a nl pace on a flat grade s sob but does fine slow and flat  Cough: none  Sleeping: ok flat/ 1-2 pillows  SABA use: none 02: none  rec Plan A = Automatic = Bevespi Take 2 puffs first thing in am and then another 2 puffs about 12 hours later.  Increase ziac to one twice daily  Plan B = Backup Only use your albuterol inhaler as a rescue medication Plan C = Crisis - only use your albuterol nebulizer if you first try Plan B and it fails to help > ok to use the nebulizer up to every 4 hours but if start needing it regularly call for immediate appointment Please schedule a follow up visit in 3 months but call sooner if needed     12/29/2018  f/u ov/Chistina Roston re: GOLD II/ bevespi Chief Complaint  Patient presents with  . Follow-up    Breathing is some better since the last visit. She is using her albuterol inhaler about 2 x per day.   Dyspnea:  Limited by back / MMRC2 = can't walk a nl pace on a flat grade s sob but does fine slow and flat   Cough: minimal / none noct  Sleeping: ok flat/ 1-2 pillows  SABA use: as above  02: none  Nausea and abd pain w/u in UC dx diverticulitis some better p cipro/flagyl but still needing phenergan and pain only 50% better > has not had f/u  rec Start clonidine 0.1 mg twice daily  - can make mouth dry but should help your back pain (use jolly ranchers)  Make appt to see Dr Regis Bill about your abdominal pain     06/29/2019  f/u ov/Priscilla Finklea re: GOLD II / bevespi  Both shots for covid 19  completed Chief Complaint  Patient presents with  . Follow-up    6 month f/u for COPD. States her breathing has gotten worse over the past 2  weeks. Increased SOB with exertion.   Dyspnea:  At onset of spring weather noted gradual reduction in ex tol, no better with nebs /assoc with subj wheeze Cough: none/ sneezing water eyes some pnds but no real cough Sleeping: feels choking on back x years, better on side/on 2 big pillows  SABA use: no better with inhaler 02: says sats drops  when walking into 80s (stopped prior to desats with walking study on day of ov)   Assoc overt HB rec Prednisone 10 mg take  4 each am x 2 days,   2 each am x 2 days,  1 each am x 2 days and stop  Change bevespi to brezri Take 2 puffs first thing in am and then another 2 puffs about 12 hours later.  Work on inhaler technique   05/05/2020  f/u ov/Catrell Morrone re: copd gold II / breztri and rarely saba Chief Complaint  Patient presents with  . Follow-up    Breathing is doing well on the [address]. She is not using her albuterol inhaler or neb. She has occ wheezing.    Dyspnea:  HT does fine leaning on cart whole store  Cough: none  Sleeping: chokes if flat on back/ better on side  SABA use: none  02: none  Covid status:   X 3    No obvious day to day or daytime variability or assoc excess/ purulent sputum or mucus plugs or hemoptysis or cp or chest tightness, subjective wheeze or overt sinus or hb symptoms.   Sleeping most nights off back  without nocturnal  or early am exacerbation  of respiratory  c/o's or need for noct saba. Also denies any obvious fluctuation of symptoms with weather or environmental changes or other aggravating or alleviating factors except as outlined above   No unusual exposure hx or h/o childhood pna/ asthma or knowledge of premature birth.  Current Allergies, Complete Past Medical History, Past Surgical History, Family History, and Social History were reviewed in [employer] [employer] record.  ROS  The following are not active complaints unless bolded Hoarseness, sore throat, dysphagia, dental problems, itching, sneezing,  nasal congestion or discharge of excess mucus or purulent secretions, ear ache,   fever, chills, sweats, unintended wt loss or wt gain, classically pleuritic or exertional cp,  orthopnea pnd or arm/hand swelling  or leg swelling, presyncope, palpitations, abdominal pain, anorexia, nausea, vomiting, diarrhea  or change in bowel habits or change in bladder habits, change in stools or change in urine, dysuria, hematuria,  rash, arthralgias, visual complaints, headache, numbness, weakness or ataxia or problems with walking or coordination,  change in mood or  memory.        Current Meds  Medication Sig  . albuterol (PROAIR HFA) 108 (90 Base) MCG/ACT inhaler 2 puffs every 4 hours as needed only  if your can't catch your breath  . albuterol (PROVENTIL) (2.5 MG/3ML) 0.083% nebulizer solution Take 3 mLs (2.5 mg total) by nebulization every 6 (six) hours as needed for wheezing or shortness of breath.  . bisoprolol-hydrochlorothiazide (ZIAC) 5-6.25 MG tablet Take 1 tablet by mouth twice daily  . Budeson-Glycopyrrol-Formoterol (BREZTRI AEROSPHERE) 160-9-4.8 MCG/ACT AERO Inhale 2 puffs into the lungs 2 (two) times daily.  . busPIRone (BUSPAR) 10 MG tablet TAKE 1 TABLET BY MOUTH THREE TIMES DAILY. MUST HAVE OFFICE VISIT FOR REFILLS (Patient taking differently: Take 10 mg by mouth 2 (two) times daily.)  . cloNIDine (CATAPRES) 0.1 MG tablet Take 1 tablet by mouth twice  daily  . Colchicine 0.6 MG CAPS Take 0.6 mg by mouth 2 (two) times daily as needed.  . gabapentin (NEURONTIN) 100 MG capsule Take 1 capsule (100 mg total) by mouth 2 (two) times daily.  Marland Kitchen gabapentin (NEURONTIN) 300 MG capsule 1 pill nightly  . loratadine (CLARITIN) 10 MG tablet Take 10 mg by mouth daily.  Marland Kitchen omeprazole (PRILOSEC) 40 MG capsule TAKE 1 CAPSULE BY MOUTH 30 TO 60 MINUTES BEFORE YOUR FIRST  AND LAST MEALS OF THE DAY  . ondansetron (ZOFRAN-ODT) 4 MG disintegrating tablet DISSOLVE 1 TABLET IN MOUTH EVERY 8 HOURS AS NEEDED FOR NAUSEA OR VOMITING PLEASE SCHEDULE A FOLLOW UP APPOINTMENT FOR FURTHER REFILLS.  Marland Kitchen venlafaxine XR (EFFEXOR XR) 75 MG 24 hr capsule Take 1 capsule (75 mg total) by mouth daily with breakfast.                          Objective:   Physical Exam  05/05/2020   232 06/29/2019  224  12/29/2018  226  09/10/2018  227  08/11/2018  226  05/08/2012  Wt 195  > 199 06/12/2012 > 201 08/01/2012 > 09/23/2012  204 > 06/19/2013  199 > 07/01/2013  195 > 08/13/2014 183     04/10/12 194 lb 9.6 oz (88.27 kg)  02/20/12 190 lb 12.8 oz (86.546 kg)    Reports no uppper/ 4 lower teeth   Vital signs reviewed  05/05/2020  - Note at rest 02 sats  97% on RA and BP 166/102 p got stressed finding office    General appearance:    Obese pleasant amb wf nad      HEENT : pt wearing mask not removed for exam due to covid - 19 concerns.    NECK :  without JVD/Nodes/TM/ nl carotid upstrokes bilaterally   LUNGS: no acc muscle use,  Mild barrel  contour chest wall with bilateral  Distant bs s audible wheeze and  without cough on insp or exp maneuvers  and mild  Hyperresonant  to  percussion bilaterally     CV:  RRR  no s3 or murmur or increase in P2, and no edema   ABD: obese  soft and nontender with pos end  insp Hoover's  in the supine position. No bruits or organomegaly appreciated, bowel sounds nl  MS:   Nl gait/  ext warm without deformities, calf tenderness, cyanosis or clubbing No obvious joint restrictions   SKIN: warm and dry without lesions    NEURO:  alert, approp, nl sensorium with  no motor or cerebellar deficits apparent.          I personally reviewed images and agree with radiology impression as follows:  CXR:   06/30/19  1. Mild linear scarring in the lingula. 2. Aortic atherosclerosis.               Assessment & Plan:

## 2020-05-08 NOTE — Progress Notes (Signed)
Chief Complaint  Patient presents with  . Annual Exam    HPI: Patient  Samantha Hinton  65 y.o. comes in today for Britt visit  And  today for follow up of  multiple medical problems.  Delayed  Monitoring    Pulm : Ok   Back I smpore limiting  Same   200 feet or more .   HT  Anxiety  Rushing today .   Down to 140 .80 .   On bid clonidine  And  ziac  Doesn't have a monitor  At home  Meds gout toes  Per dr Lurlean Leyden out of allopurinol  3 weeks ago  No flare  Back pain   To get   Leg feet vascular  eval  Weight gain    One meal per day  Eating carb stuff   Back is limiting   Worsening  Concern about dm   [address] ouside tripped  Hit knee and head  No loc   Healing abrasion  Left knee   Health Maintenance  Topic Date Due  . PAP SMEAR-Modifier  02/20/2007  . MAMMOGRAM  07/02/2015  . INFLUENZA VACCINE  06/25/2020 (Originally 09/20/2019)  . COLONOSCOPY (Pts 45-55yrs Insurance coverage will need to be confirmed)  04/12/2022  . TETANUS/TDAP  05/23/2024  . COVID-19 Vaccine  Completed  . Hepatitis C Screening  Completed  . HIV Screening  Completed  . HPV VACCINES  Aged Out   Health Maintenance Review LIFESTYLE:  Exercise:   Activity  Low.  Back an issues    Hs of surgery  Tobacco/ETS:n Alcohol:  Wine beer   2 some days no Sugar beverages: Sleep: 4-6   Drug use: no HH of   1  2 yorkies      ROS:  GEN/ HEENT: No fever, significant weight changes sweats headaches vision problems hearing changes, CV/ PULM; No chest pain shortness of breath cough, syncope,edema  change in exercise tolerance. GI /GU: No adominal pain, vomiting, change in bowel habits. No blood in the stool. No significant GU symptoms. SKIN/HEME: ,no acute skin rashes suspicious lesions or bleeding. No lymphadenopathy, nodules, masses.  NEURO/ PSYCH:  At times  Toes   numbness.  Hot then cold feeling  IMM/ Allergy: No unusual infections.  Allergy .   REST of 12 system review negative except as  per HPI   Past Medical History:  Diagnosis Date  . Chronic back pain    buldging disc and tumor.Spondylolisthesis  . COPD (chronic obstructive pulmonary disease) (HCC)    Symbicort daily and ProAir as needed  . Emphysema   . Emphysema of lung ([place])   . Fatty liver 04/13/2019  . Fibromyalgia   . Genital warts   . GERD (gastroesophageal reflux disease)    takes Omeprazole daily  . History of bronchitis 01/2014  . History of colon polyps   . Hyperlipidemia    has been off of meds d/t getting sick from them.Not been addressed again.   Marland Kitchen Hypertension    takes Ziac daily  . Nocturia   . Pneumonia    hx of-8+yrs ago  . PONV (postoperative nausea and vomiting)   . Shortness of breath dyspnea    thinks d/t pain meds and notices with exertion  . Stroke ([place])    16 yrs ago--left eye     Past Surgical History:  Procedure Laterality Date  . ABDOMINAL HYSTERECTOMY  1983   partial   . ADENOIDECTOMY    .  ANTERIOR (CYSTOCELE) AND POSTERIOR REPAIR (RECTOCELE) WITH XENFORM GRAFT AND SACROSPINOUS FIXATION    . APPENDECTOMY  1967  . BREAST SURGERY Right    diseased milk glands  . CHOLECYSTECTOMY  1983  . COLONOSCOPY  04/13/2019  . DIAGNOSTIC LAPAROSCOPY     cyst removed from ovaries   . ESOPHAGOGASTRODUODENOSCOPY    . NECK SURGERY  [phone]   c spine ant and post hx fusion and removal or harcdware and shavings  . POLYPECTOMY    . rods and screws in lumbar    . TONSILLECTOMY    . UPPER GASTROINTESTINAL ENDOSCOPY  04/13/2019    Family History  Problem Relation Age of Onset  . CAD Father        tumor between heart and lung   . Hypertension Father   . CAD Sister        ? dx   . Hypertension Mother   . Lung cancer Mother        died 1   . Diabetes Maternal Grandmother        late 5s   . Colon cancer Neg Hx   . Colon polyps Neg Hx   . Esophageal cancer Neg Hx   . Rectal cancer Neg Hx   . Stomach cancer Neg Hx     Social History   Socioeconomic History  .  Marital status: Widowed    Spouse name: Not on file  . Number of children: 2  . Years of education: Not on file  . Highest education level: Not on file  Occupational History  . Occupation: retired    Fish farm manager: [name]  Tobacco Use  . Smoking status: Former Smoker    Packs/day: 0.25    Years: 44.00    Pack years: 11.00    Types: Cigarettes    Quit date: 02/04/2014    Years since quitting: 6.2  . Smokeless tobacco: Never Used  . Tobacco comment: quit smoking in Dec 2015  Vaping Use  . Vaping Use: Former  Substance and Sexual Activity  . Alcohol use: Yes    Alcohol/week: 4.0 standard drinks    Types: 4 Standard drinks or equivalent per week    Comment: couple times a week  . Drug use: No  . Sexual activity: Not Currently    Birth control/protection: Surgical    Comment: 1st intercourse 65 yo-Fewer than 5 partners  Other Topics Concern  . Not on file  Social History Narrative   6-8 hours of sleep per night   Lives with her fiance hh of 2 no pets    1 dog in the home   Retired no ets  But tob 8 per day   etoh ocass 1-2    On disability from her neck surgery predicaments .   orig from [employer] in [place] area.    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 2   Last Pap 2006 last mammogram 2000 and   Has dentures   FA    Social Determinants of Health   Financial Resource Strain: Medium Risk  . Difficulty of Paying Living Expenses: Somewhat hard  Food Insecurity: Not on file  Transportation Needs: No Transportation Needs  . Lack of Transportation (Medical): No  . Lack of Transportation (Non-Medical): No  Physical Activity: Not on file  Stress: Not on file  Social Connections: Not on file    Outpatient Medications Prior to Visit  Medication Sig Dispense Refill  . albuterol (PROAIR  HFA) 108 (90 Base) MCG/ACT inhaler 2 puffs every 4 hours as needed only  if your can't catch your breath 18 g 11  . albuterol (PROVENTIL) (2.5 MG/3ML) 0.083% nebulizer  solution Take 3 mLs (2.5 mg total) by nebulization every 6 (six) hours as needed for wheezing or shortness of breath. 75 mL 12  . allopurinol (ZYLOPRIM) 100 MG tablet Take 1 tablet by mouth twice daily 60 tablet 0  . bisoprolol-hydrochlorothiazide (ZIAC) 5-6.25 MG tablet Take 1 tablet by mouth twice daily 180 tablet 1  . Budeson-Glycopyrrol-Formoterol (BREZTRI AEROSPHERE) 160-9-4.8 MCG/ACT AERO Inhale 2 puffs into the lungs 2 (two) times daily. 32.1 g 3  . Budeson-Glycopyrrol-Formoterol (BREZTRI AEROSPHERE) 160-9-4.8 MCG/ACT AERO Inhale 2 puffs into the lungs in the morning and at bedtime. 5.9 g 0  . busPIRone (BUSPAR) 10 MG tablet TAKE 1 TABLET BY MOUTH THREE TIMES DAILY. MUST HAVE OFFICE VISIT FOR REFILLS (Patient taking differently: Take 10 mg by mouth 2 (two) times daily.) 90 tablet 0  . cloNIDine (CATAPRES) 0.1 MG tablet Take 1 tablet by mouth twice daily 60 tablet 5  . Colchicine 0.6 MG CAPS Take 0.6 mg by mouth 2 (two) times daily as needed. 60 capsule 1  . gabapentin (NEURONTIN) 100 MG capsule Take 1 capsule (100 mg total) by mouth 2 (two) times daily. 60 capsule 0  . gabapentin (NEURONTIN) 300 MG capsule 1 pill nightly 90 capsule 3  . loratadine (CLARITIN) 10 MG tablet Take 10 mg by mouth daily.    Marland Kitchen omeprazole (PRILOSEC) 40 MG capsule TAKE 1 CAPSULE BY MOUTH 30 TO 60 MINUTES BEFORE YOUR FIRST AND LAST MEALS OF THE DAY 60 capsule 5  . ondansetron (ZOFRAN-ODT) 4 MG disintegrating tablet DISSOLVE 1 TABLET IN MOUTH EVERY 8 HOURS AS NEEDED FOR NAUSEA OR VOMITING PLEASE SCHEDULE A FOLLOW UP APPOINTMENT FOR FURTHER REFILLS. 30 tablet 0  . predniSONE (DELTASONE) 10 MG tablet Take  4 each am x 2 days,   2 each am x 2 days,  1 each am x 2 days and stop 14 tablet 11  . venlafaxine XR (EFFEXOR XR) 75 MG 24 hr capsule Take 1 capsule (75 mg total) by mouth daily with breakfast. 90 capsule 3   No facility-administered medications prior to visit.     EXAM:  BP (!) 158/80 (BP Location: Left Arm,  Patient Position: Sitting, Cuff Size: Large)   Pulse 78   Temp 98.7 F (37.1 C) (Oral)   Ht 5\' 5"  (1.651 m)   Wt 232 lb 6.4 oz (105.4 kg)   SpO2 97%   BMI 38.67 kg/m   Body mass index is 38.67 kg/m. Wt Readings from Last 3 Encounters:  05/09/20 232 lb 6.4 oz (105.4 kg)  05/05/20 232 lb (105.2 kg)  04/08/20 224 lb (101.6 kg)    Physical Exam: Vital signs reviewed JJH:ERDE is a well-developed well-nourished alert cooperative    who appearsr stated age in no acute distress.  HEENT: normocephalic atraumatic , Eyes: PERRL EOM's full, conjunctiva clear, Nares: paten,t no deformity discharge or tenderness., Ears: no deformity EAC's clear TMs with normal landmarks. Mouth: masked NECK: supple without masses, thyromegaly or bruits. CHEST/PULM:  Clear to auscultation and percussion breath sounds equal no wheeze , rales or rhonchi. No chest wall deformities or tenderness. Breast: normal by inspection . No dimpling, discharge, masses, tenderness or discharge . CV: PMI is nondisplaced, S1 S2 no gallops, murmurs, rubs. Peripheral pulses are prsent .  ABDOMEN: Bowel sounds normal nontender  No guard  or rebound, no hepato splenomegal no CVA tenderness.  No hernia. Central obesity  Extremtities:  No clubbing cyanosis distal shiny feet scaling  And enlarged second toess  No ulcers    Abrasion left knee healing , no acute joint swelling or redness no focal atrophy NEURO:  Oriented x3, cranial nerves 3-12 appear to be intact, no obvious focal weakness,gait within normal limits no dec sense large toes   SKIN: No acute rashes normal turgor, color,sun and age  changes tatooes   . X left knee scab abrasion  PSYCH: Oriented, good eye contact, no obvious depression anxiety, cognition and judgment appear normal. LN: no cervical axillary inguinal adenopathy  Lab Results  Component Value Date   WBC 6.2 05/09/2020   HGB 13.6 05/09/2020   HCT 41.2 05/09/2020   PLT 195.0 05/09/2020   GLUCOSE 114 (H) 05/09/2020    CHOL 330 (H) 05/09/2020   TRIG 255.0 (H) 05/09/2020   HDL 63.00 05/09/2020   LDLDIRECT 242.0 05/09/2020   LDLCALC 116 (H) 05/24/2014   ALT 35 05/09/2020   AST 58 (H) 05/09/2020   NA 137 05/09/2020   K 4.5 05/09/2020   CL 93 (L) 05/09/2020   CREATININE 1.01 05/09/2020   BUN 22 05/09/2020   CO2 33 (H) 05/09/2020   TSH 3.06 05/09/2020   HGBA1C 6.0 05/09/2020   MICROALBUR 5.2 (H) 05/09/2020    BP Readings from Last 3 Encounters:  05/09/20 (!) 158/80  05/05/20 (!) 166/102  04/08/20 (!) 150/82    Lab plan   reviewed with patient  Is fasting   ASSESSMENT AND PLAN:  Discussed the following assessment and plan:    ICD-10-CM   1. Encounter for preventative adult health care exam with abnormal findings  Z00.01   2. Medication management  I95.188 Basic metabolic panel    CBC with Differential/Platelet    Hemoglobin A1c    Hepatic function panel    Lipid panel    TSH    Microalbumin / creatinine urine ratio    Uric acid    Uric acid    Microalbumin / creatinine urine ratio    TSH    Lipid panel    Hepatic function panel    Hemoglobin A1c    CBC with Differential/Platelet    Basic metabolic panel  3. COPD GOLD II  C16.6 Basic metabolic panel    CBC with Differential/Platelet    Hemoglobin A1c    Hepatic function panel    Lipid panel    TSH    Microalbumin / creatinine urine ratio    Microalbumin / creatinine urine ratio    TSH    Lipid panel    Hepatic function panel    Hemoglobin A1c    CBC with Differential/Platelet    Basic metabolic panel  4. Essential hypertension, benign  A63 Basic metabolic panel    CBC with Differential/Platelet    Hemoglobin A1c    Hepatic function panel    Lipid panel    TSH    Microalbumin / creatinine urine ratio    Microalbumin / creatinine urine ratio    TSH    Lipid panel    Hepatic function panel    Hemoglobin A1c    CBC with Differential/Platelet    Basic metabolic panel    Amb Ref to Medical Weight Management    reports better control at home get monitor  and plan fu with readings  5. Hyperglycemia  K16.0 Basic metabolic panel    CBC with  Differential/Platelet    Hemoglobin A1c    Hepatic function panel    Lipid panel    TSH    Microalbumin / creatinine urine ratio    Microalbumin / creatinine urine ratio    TSH    Lipid panel    Hepatic function panel    Hemoglobin A1c    CBC with Differential/Platelet    Basic metabolic panel    Amb Ref to Medical Weight Management   probabaly early DM disc metformin  other weight nuertal or loss meds   6. Hyperlipidemia, unspecified hyperlipidemia type  T55.7 Basic metabolic panel    CBC with Differential/Platelet    Hemoglobin A1c    Hepatic function panel    Lipid panel    TSH    Microalbumin / creatinine urine ratio    Microalbumin / creatinine urine ratio    TSH    Lipid panel    Hepatic function panel    Hemoglobin A1c    CBC with Differential/Platelet    Basic metabolic panel    Amb Ref to Medical Weight Management  7. Encounter for screening for malignant neoplasm of breast, unspecified screening modality  Z12.39 MM DIGITAL SCREENING BILATERAL  8. Class 2 severe obesity with serious comorbidity and body mass index (BMI) of 38.0 to 38.9 in adult, unspecified obesity type (HCC)  D22.02 Basic metabolic panel   R42.70 CBC with Differential/Platelet    Hemoglobin A1c    Hepatic function panel    Lipid panel    TSH    Microalbumin / creatinine urine ratio    Microalbumin / creatinine urine ratio    TSH    Lipid panel    Hepatic function panel    Hemoglobin A1c    CBC with Differential/Platelet    Basic metabolic panel    Amb Ref to Medical Weight Management  9. History of gout  Z87.39 Uric acid    Uric acid  labs today  Fasting  Mammogram order  Refer weight management  Fall prevention  prob has neuropathy early feet   Bp control  Check Uric acid   We can  rx the allopurinol but still fu dr Sharol Given as appropriate  Return in about 3  months (around 08/09/2020) for depending on results.  Patient Care Team: Lonnetta Kniskern, Standley Brooking, MD as PCP - General (Internal Medicine) Tanda Rockers, MD as Consulting Physician (Pulmonary Disease) Karie Chimera, MD as Consulting Physician (Neurosurgery) Viona Gilmore, [facility] as Pharmacist (Pharmacist) Thornton Park, MD as Consulting Physician (Gastroenterology) Patient Instructions  Will refer for weight management.   Plan beginning  Metformin depending on lab results.  Take blood pressure readings twice a day for 5-7  days and record .     Take 2 -3 readings at each sitting .   Can send in readings  by My Chart.  Or call  In readings     Before checking your blood pressure make sure: You are seated and quite for 5 min before checking Feet are flat on the floor Siting in chair with your back supported straight up and down Arm resting on table or arm of chair at heart level Bladder is empty You have NOT had caffeine or tobacco within the last 30 min  [email or web address]  Plan  Get eye  Check   When can  mamogram . Will order  Plan follow up depending. Soon .      Standley Brooking. Nealie Mchatton M.D.

## 2020-05-09 ENCOUNTER — Encounter: Payer: Self-pay | Admitting: Internal Medicine

## 2020-05-09 ENCOUNTER — Ambulatory Visit (INDEPENDENT_AMBULATORY_CARE_PROVIDER_SITE_OTHER): Payer: Medicare Other | Admitting: Internal Medicine

## 2020-05-09 ENCOUNTER — Other Ambulatory Visit: Payer: Self-pay

## 2020-05-09 VITALS — BP 158/80 | HR 78 | Temp 98.7°F | Ht 65.0 in | Wt 232.4 lb

## 2020-05-09 DIAGNOSIS — I10 Essential (primary) hypertension: Secondary | ICD-10-CM | POA: Diagnosis not present

## 2020-05-09 DIAGNOSIS — R739 Hyperglycemia, unspecified: Secondary | ICD-10-CM

## 2020-05-09 DIAGNOSIS — Z0001 Encounter for general adult medical examination with abnormal findings: Secondary | ICD-10-CM

## 2020-05-09 DIAGNOSIS — J449 Chronic obstructive pulmonary disease, unspecified: Secondary | ICD-10-CM

## 2020-05-09 DIAGNOSIS — Z8739 Personal history of other diseases of the musculoskeletal system and connective tissue: Secondary | ICD-10-CM | POA: Diagnosis not present

## 2020-05-09 DIAGNOSIS — E785 Hyperlipidemia, unspecified: Secondary | ICD-10-CM

## 2020-05-09 DIAGNOSIS — Z6838 Body mass index (BMI) 38.0-38.9, adult: Secondary | ICD-10-CM

## 2020-05-09 DIAGNOSIS — Z1239 Encounter for other screening for malignant neoplasm of breast: Secondary | ICD-10-CM | POA: Diagnosis not present

## 2020-05-09 DIAGNOSIS — Z0000 Encounter for general adult medical examination without abnormal findings: Secondary | ICD-10-CM | POA: Diagnosis not present

## 2020-05-09 DIAGNOSIS — Z79899 Other long term (current) drug therapy: Secondary | ICD-10-CM

## 2020-05-09 LAB — LIPID PANEL
Cholesterol: 330 mg/dL — ABNORMAL HIGH (ref 0–200)
HDL: 63 mg/dL (ref >39.00)
NonHDL: 267.17
Total CHOL/HDL Ratio: 5
Triglycerides: 255 mg/dL — ABNORMAL HIGH (ref 0.0–149.0)
VLDL: 51 mg/dL — ABNORMAL HIGH (ref 0.0–40.0)

## 2020-05-09 LAB — CBC WITH DIFFERENTIAL/PLATELET
Basophils Absolute: 0 10*3/uL (ref 0.0–0.1)
Basophils Relative: 0.8 % (ref 0.0–3.0)
Eosinophils Absolute: 0.1 10*3/uL (ref 0.0–0.7)
Eosinophils Relative: 1.6 % (ref 0.0–5.0)
HCT: 41.2 % (ref 36.0–46.0)
Hemoglobin: 13.6 g/dL (ref 12.0–15.0)
Lymphocytes Relative: 22.7 % (ref 12.0–46.0)
Lymphs Abs: 1.4 10*3/uL (ref 0.7–4.0)
MCHC: 32.9 g/dL (ref 30.0–36.0)
MCV: 94.1 fl (ref 78.0–100.0)
Monocytes Absolute: 0.4 10*3/uL (ref 0.1–1.0)
Monocytes Relative: 6.3 % (ref 3.0–12.0)
Neutro Abs: 4.3 10*3/uL (ref 1.4–7.7)
Neutrophils Relative %: 68.6 % (ref 43.0–77.0)
Platelets: 195 10*3/uL (ref 150.0–400.0)
RBC: 4.38 Mil/uL (ref 3.87–5.11)
RDW: 16.9 % — ABNORMAL HIGH (ref 11.5–15.5)
WBC: 6.2 10*3/uL (ref 4.0–10.5)

## 2020-05-09 LAB — MICROALBUMIN / CREATININE URINE RATIO
Creatinine,U: 133.1 mg/dL
Microalb Creat Ratio: 3.9 mg/g (ref 0.0–30.0)
Microalb, Ur: 5.2 mg/dL — ABNORMAL HIGH (ref 0.0–1.9)

## 2020-05-09 LAB — HEMOGLOBIN A1C: Hgb A1c MFr Bld: 6 % (ref 4.6–6.5)

## 2020-05-09 LAB — BASIC METABOLIC PANEL
BUN: 22 mg/dL (ref 6–23)
CO2: 33 mEq/L — ABNORMAL HIGH (ref 19–32)
Calcium: 9.5 mg/dL (ref 8.4–10.5)
Chloride: 93 mEq/L — ABNORMAL LOW (ref 96–112)
Creatinine, Ser: 1.01 mg/dL (ref 0.40–1.20)
GFR: 58.85 mL/min — ABNORMAL LOW (ref >60.00)
Glucose, Bld: 114 mg/dL — ABNORMAL HIGH (ref 70–99)
Potassium: 4.5 mEq/L (ref 3.5–5.1)
Sodium: 137 mEq/L (ref 135–145)

## 2020-05-09 LAB — TSH: TSH: 3.06 u[IU]/mL (ref 0.35–4.50)

## 2020-05-09 LAB — LDL CHOLESTEROL, DIRECT: Direct LDL: 242 mg/dL

## 2020-05-09 LAB — HEPATIC FUNCTION PANEL
ALT: 35 U/L (ref 0–35)
AST: 58 U/L — ABNORMAL HIGH (ref 0–37)
Albumin: 4.4 g/dL (ref 3.5–5.2)
Alkaline Phosphatase: 79 U/L (ref 39–117)
Bilirubin, Direct: 0.2 mg/dL (ref 0.0–0.3)
Total Bilirubin: 1.1 mg/dL (ref 0.2–1.2)
Total Protein: 7.2 g/dL (ref 6.0–8.3)

## 2020-05-09 LAB — URIC ACID: Uric Acid, Serum: 10.1 mg/dL — ABNORMAL HIGH (ref 2.4–7.0)

## 2020-05-09 NOTE — Patient Instructions (Addendum)
Will refer for weight management.   Plan beginning  Metformin depending on lab results.  Take blood pressure readings twice a day for 5-7  days and record .     Take 2 -3 readings at each sitting .   Can send in readings  by My Chart.  Or call  In readings     Before checking your blood pressure make sure: You are seated and quite for 5 min before checking Feet are flat on the floor Siting in chair with your back supported straight up and down Arm resting on table or arm of chair at heart level Bladder is empty You have NOT had caffeine or tobacco within the last 30 min  [email or web address]  Plan  Get eye  Check   When can  mamogram . Will order  Plan follow up depending. Soon .

## 2020-05-16 ENCOUNTER — Other Ambulatory Visit: Payer: Self-pay | Admitting: Internal Medicine

## 2020-05-16 DIAGNOSIS — Z8739 Personal history of other diseases of the musculoskeletal system and connective tissue: Secondary | ICD-10-CM

## 2020-05-16 DIAGNOSIS — E785 Hyperlipidemia, unspecified: Secondary | ICD-10-CM

## 2020-05-16 DIAGNOSIS — Z79899 Other long term (current) drug therapy: Secondary | ICD-10-CM

## 2020-05-16 DIAGNOSIS — R739 Hyperglycemia, unspecified: Secondary | ICD-10-CM

## 2020-05-16 MED ORDER — ALLOPURINOL 100 MG PO TABS: 100.0000 mg | ORAL_TABLET | Freq: Two times a day (BID) | ORAL | 3 refills | Status: DC

## 2020-05-16 MED ORDER — ROSUVASTATIN CALCIUM 10 MG PO TABS: 10.0000 mg | ORAL_TABLET | Freq: Every day | ORAL | 1 refills | Status: DC

## 2020-05-16 NOTE — Progress Notes (Signed)
So yes your uric acid is elevated we need to get you back on allopurinol. Cholesterol is way high would advise getting on medication in addition to changing your eating habits and trying to lose weight.   Blood sugar is in the prediabetic range And thyroid is in the normal range. Liver tests are improved from last time. I will send in allopurinol to your pharmacy  We will also send in statin cholesterol medicine advised.   Plan follow-up lab tests in 3 months and visit.  To review. Hopefully work with weight management will also be helpful.

## 2020-05-18 ENCOUNTER — Encounter: Payer: Medicare Other | Admitting: Neurology

## 2020-05-19 NOTE — Progress Notes (Deleted)
[place] Phone: [phone] Subjective:    I'm seeing this patient by the request  of:  Panosh, Standley Brooking, MD  CC:   XKG:[id number]   04/08/2020 Patient initially did respond very well to the Effexor.  We will keep at the 75 mg because patient does feel like it has helped her anxiety and depression.  Once again warned about the BuSpar.  Patient has not noticed any side effects to taking them together at this time.  Did discuss okay to increase the BuSpar I would decrease the Effexor.   Update 05/20/2020 Samantha Hinton is a 65 y.o. female coming in with complaint of R leg pain. Patient states       Past Medical History:  Diagnosis Date  . Chronic back pain    buldging disc and tumor.Spondylolisthesis  . COPD (chronic obstructive pulmonary disease) (HCC)    Symbicort daily and ProAir as needed  . Emphysema   . Emphysema of lung ([place])   . Fatty liver 04/13/2019  . Fibromyalgia   . Genital warts   . GERD (gastroesophageal reflux disease)    takes Omeprazole daily  . History of bronchitis 01/2014  . History of colon polyps   . Hyperlipidemia    has been off of meds d/t getting sick from them.Not been addressed again.   Marland Kitchen Hypertension    takes Ziac daily  . Nocturia   . Pneumonia    hx of-8+yrs ago  . PONV (postoperative nausea and vomiting)   . Shortness of breath dyspnea    thinks d/t pain meds and notices with exertion  . Stroke ([place])    16 yrs ago--left eye    Past Surgical History:  Procedure Laterality Date  . ABDOMINAL HYSTERECTOMY  1983   partial   . ADENOIDECTOMY    . ANTERIOR (CYSTOCELE) AND POSTERIOR REPAIR (RECTOCELE) WITH XENFORM GRAFT AND SACROSPINOUS FIXATION    . APPENDECTOMY  1967  . BREAST SURGERY Right    diseased milk glands  . CHOLECYSTECTOMY  1983  . COLONOSCOPY  04/13/2019  . DIAGNOSTIC LAPAROSCOPY     cyst removed from ovaries   . ESOPHAGOGASTRODUODENOSCOPY    . NECK  SURGERY  [phone]   c spine ant and post hx fusion and removal or harcdware and shavings  . POLYPECTOMY    . rods and screws in lumbar    . TONSILLECTOMY    . UPPER GASTROINTESTINAL ENDOSCOPY  04/13/2019   Social History   Socioeconomic History  . Marital status: Widowed    Spouse name: Not on file  . Number of children: 2  . Years of education: Not on file  . Highest education level: Not on file  Occupational History  . Occupation: retired    Fish farm manager: [name]  Tobacco Use  . Smoking status: Former Smoker    Packs/day: 0.25    Years: 44.00    Pack years: 11.00    Types: Cigarettes    Quit date: 02/04/2014    Years since quitting: 6.2  . Smokeless tobacco: Never Used  . Tobacco comment: quit smoking in Dec 2015  Vaping Use  . Vaping Use: Former  Substance and Sexual Activity  . Alcohol use: Yes    Alcohol/week: 4.0 standard drinks    Types: 4 Standard drinks or equivalent per week    Comment: couple times a week  . Drug use: No  . Sexual activity: Not Currently  Birth control/protection: Surgical    Comment: 1st intercourse 65 yo-Fewer than 5 partners  Other Topics Concern  . Not on file  Social History Narrative   6-8 hours of sleep per night   Lives with her fiance hh of 2 no pets    1 dog in the home   Retired no ets  But tob 8 per day   etoh ocass 1-2    On disability from her neck surgery predicaments .   orig from [employer] in [place] area.    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 2   Last Pap 2006 last mammogram 2000 and   Has dentures   FA    Social Determinants of Health   Financial Resource Strain: Medium Risk  . Difficulty of Paying Living Expenses: Somewhat hard  Food Insecurity: Not on file  Transportation Needs: No Transportation Needs  . Lack of Transportation (Medical): No  . Lack of Transportation (Non-Medical): No  Physical Activity: Not on file  Stress: Not on file  Social Connections: Not on file    Allergies  Allergen Reactions  . Wellbutrin [Bupropion] Nausea And Vomiting   Family History  Problem Relation Age of Onset  . CAD Father        tumor between heart and lung   . Hypertension Father   . CAD Sister        ? dx   . Hypertension Mother   . Lung cancer Mother        died 32   . Diabetes Maternal Grandmother        late 8s   . Colon cancer Neg Hx   . Colon polyps Neg Hx   . Esophageal cancer Neg Hx   . Rectal cancer Neg Hx   . Stomach cancer Neg Hx     Current Outpatient Medications (Endocrine & Metabolic):  .  predniSONE (DELTASONE) 10 MG tablet, Take  4 each am x 2 days,   2 each am x 2 days,  1 each am x 2 days and stop  Current Outpatient Medications (Cardiovascular):  .  bisoprolol-hydrochlorothiazide (ZIAC) 5-6.25 MG tablet, Take 1 tablet by mouth twice daily .  cloNIDine (CATAPRES) 0.1 MG tablet, Take 1 tablet by mouth twice daily .  rosuvastatin (CRESTOR) 10 MG tablet, Take 1 tablet (10 mg total) by mouth daily. For high cholesterol,avoid taking with cochicine  Current Outpatient Medications (Respiratory):  .  albuterol (PROAIR HFA) 108 (90 Base) MCG/ACT inhaler, 2 puffs every 4 hours as needed only  if your can't catch your breath .  albuterol (PROVENTIL) (2.5 MG/3ML) 0.083% nebulizer solution, Take 3 mLs (2.5 mg total) by nebulization every 6 (six) hours as needed for wheezing or shortness of breath. .  Budeson-Glycopyrrol-Formoterol (BREZTRI AEROSPHERE) 160-9-4.8 MCG/ACT AERO, Inhale 2 puffs into the lungs 2 (two) times daily. .  Budeson-Glycopyrrol-Formoterol (BREZTRI AEROSPHERE) 160-9-4.8 MCG/ACT AERO, Inhale 2 puffs into the lungs in the morning and at bedtime. Marland Kitchen  loratadine (CLARITIN) 10 MG tablet, Take 10 mg by mouth daily.  Current Outpatient Medications (Analgesics):  .  allopurinol (ZYLOPRIM) 100 MG tablet, Take 1 tablet (100 mg total) by mouth 2 (two) times daily. .  Colchicine 0.6 MG CAPS, Take 0.6 mg by mouth 2 (two) times daily as  needed.   Current Outpatient Medications (Other):  .  busPIRone (BUSPAR) 10 MG tablet, TAKE 1 TABLET BY MOUTH THREE TIMES DAILY. MUST HAVE OFFICE VISIT FOR REFILLS (Patient taking  differently: Take 10 mg by mouth 2 (two) times daily.) .  gabapentin (NEURONTIN) 100 MG capsule, Take 1 capsule (100 mg total) by mouth 2 (two) times daily. Marland Kitchen  gabapentin (NEURONTIN) 300 MG capsule, 1 pill nightly .  omeprazole (PRILOSEC) 40 MG capsule, TAKE 1 CAPSULE BY MOUTH 30 TO 60 MINUTES BEFORE YOUR FIRST AND LAST MEALS OF THE DAY .  ondansetron (ZOFRAN-ODT) 4 MG disintegrating tablet, DISSOLVE 1 TABLET IN MOUTH EVERY 8 HOURS AS NEEDED FOR NAUSEA OR VOMITING PLEASE SCHEDULE A FOLLOW UP APPOINTMENT FOR FURTHER REFILLS. Marland Kitchen  venlafaxine XR (EFFEXOR XR) 75 MG 24 hr capsule, Take 1 capsule (75 mg total) by mouth daily with breakfast.   Reviewed prior external information including notes and imaging from  primary care provider As well as notes that were available from care everywhere and other healthcare systems.  Past medical history, social, surgical and family history all reviewed in electronic medical record.  No pertanent information unless stated regarding to the chief complaint.   Review of Systems:  No headache, visual changes, nausea, vomiting, diarrhea, constipation, dizziness, abdominal pain, skin rash, fevers, chills, night sweats, weight loss, swollen lymph nodes, body aches, joint swelling, chest pain, shortness of breath, mood changes. POSITIVE muscle aches  Objective  There were no vitals taken for this visit.   General: No apparent distress alert and oriented x3 mood and affect normal, dressed appropriately.  HEENT: Pupils equal, extraocular movements intact  Respiratory: Patient's speak in full sentences and does not appear short of breath  Cardiovascular: No lower extremity edema, non tender, no erythema  Gait normal with good balance and coordination.  MSK:  Non tender with full range of  motion and good stability and symmetric strength and tone of shoulders, elbows, wrist, hip, knee and ankles bilaterally.     Impression and Recommendations:     The above documentation has been reviewed and is accurate and complete Jacqualin Combes

## 2020-05-20 ENCOUNTER — Ambulatory Visit: Payer: Medicare Other | Admitting: Family Medicine

## 2020-05-23 ENCOUNTER — Other Ambulatory Visit: Payer: Self-pay

## 2020-05-23 DIAGNOSIS — F4322 Adjustment disorder with anxiety: Secondary | ICD-10-CM

## 2020-05-23 MED ORDER — BUSPIRONE HCL 10 MG PO TABS: ORAL_TABLET | ORAL | 0 refills | Status: DC

## 2020-05-25 ENCOUNTER — Inpatient Hospital Stay (HOSPITAL_BASED_OUTPATIENT_CLINIC_OR_DEPARTMENT_OTHER): Admission: RE | Admit: 2020-05-25 | Payer: Medicare Other | Source: Ambulatory Visit | Admitting: Radiology

## 2020-05-27 NOTE — Progress Notes (Signed)
So   since her a1c is better   can wait until sees weight managfement

## 2020-06-06 ENCOUNTER — Other Ambulatory Visit: Payer: Self-pay | Admitting: Internal Medicine

## 2020-06-06 MED ORDER — BISOPROLOL-HYDROCHLOROTHIAZIDE 5-6.25 MG PO TABS: 1.0000 | ORAL_TABLET | Freq: Two times a day (BID) | ORAL | 1 refills | Status: DC

## 2020-06-07 NOTE — Progress Notes (Signed)
[place] Phone: [phone] Subjective:   Samantha Hinton, am serving as a scribe for Dr. Hulan Saas. This visit occurred during the SARS-CoV-2 public health emergency.  Safety protocols were in place, including screening questions prior to the visit, additional usage of staff PPE, and extensive cleaning of exam room while observing appropriate contact time as indicated for disinfecting solutions.   I'm seeing this patient by the request  of:  Panosh, Standley Brooking, MD  CC: Low back and leg pain  TOI:[id number]   04/08/2020 Patient initially did respond very well to the Effexor.  We will keep at the 75 mg because patient does feel like it has helped her anxiety and depression.  Once again warned about the BuSpar.  Patient has not noticed any side effects to taking them together at this time.  Did discuss okay to increase the BuSpar I would decrease the Effexor.  Update 06/08/2020 Samantha Hinton is a 65 y.o. female coming in with complaint of leg pain. Patient cancelled MRI and EMG as she was told that she needed to see PCP about blood glucose and then wanted to speak with Dr. Tamala Julian about plan moving forward. Patient states that she is unsure if she is to be put on metformin based on lab values. Also was told that weight management was going to call her.   Patient continues to have pain in back and legs. Using 300mg  of gabapentin at night and 100mg  in the morning and one at lunch. Medication is not working as well as it once did.  Patient is noticing increasing and back pain.  Starting to affect daily activities.  Patient is feeling like she is having weakness in her legs.  Patient states that she is unable to get in and out of her car without feeling like she is going to lose her balance.  Has fallen 3 times since we have seen her.  Patient        Past Medical History:  Diagnosis Date  . Chronic back pain    buldging  disc and tumor.Spondylolisthesis  . COPD (chronic obstructive pulmonary disease) (HCC)    Symbicort daily and ProAir as needed  . Emphysema   . Emphysema of lung ([place])   . Fatty liver 04/13/2019  . Fibromyalgia   . Genital warts   . GERD (gastroesophageal reflux disease)    takes Omeprazole daily  . History of bronchitis 01/2014  . History of colon polyps   . Hyperlipidemia    has been off of meds d/t getting sick from them.Not been addressed again.   Marland Kitchen Hypertension    takes Ziac daily  . Nocturia   . Pneumonia    hx of-8+yrs ago  . PONV (postoperative nausea and vomiting)   . Shortness of breath dyspnea    thinks d/t pain meds and notices with exertion  . Stroke ([place])    16 yrs ago--left eye    Past Surgical History:  Procedure Laterality Date  . ABDOMINAL HYSTERECTOMY  1983   partial   . ADENOIDECTOMY    . ANTERIOR (CYSTOCELE) AND POSTERIOR REPAIR (RECTOCELE) WITH XENFORM GRAFT AND SACROSPINOUS FIXATION    . APPENDECTOMY  1967  . BREAST SURGERY Right    diseased milk glands  . CHOLECYSTECTOMY  1983  . COLONOSCOPY  04/13/2019  . DIAGNOSTIC LAPAROSCOPY     cyst removed from ovaries   . ESOPHAGOGASTRODUODENOSCOPY    . NECK  SURGERY  [phone]   c spine ant and post hx fusion and removal or harcdware and shavings  . POLYPECTOMY    . rods and screws in lumbar    . TONSILLECTOMY    . UPPER GASTROINTESTINAL ENDOSCOPY  04/13/2019   Social History   Socioeconomic History  . Marital status: Widowed    Spouse name: Not on file  . Number of children: 2  . Years of education: Not on file  . Highest education level: Not on file  Occupational History  . Occupation: retired    Fish farm manager: [name]  Tobacco Use  . Smoking status: Former Smoker    Packs/day: 0.25    Years: 44.00    Pack years: 11.00    Types: Cigarettes    Quit date: 02/04/2014    Years since quitting: 6.3  . Smokeless tobacco: Never Used  . Tobacco comment: quit smoking in Dec 2015  Vaping Use   . Vaping Use: Former  Substance and Sexual Activity  . Alcohol use: Yes    Alcohol/week: 4.0 standard drinks    Types: 4 Standard drinks or equivalent per week    Comment: couple times a week  . Drug use: Hinton  . Sexual activity: Not Currently    Birth control/protection: Surgical    Comment: 1st intercourse 65 yo-Fewer than 5 partners  Other Topics Concern  . Not on file  Social History Narrative   6-8 hours of sleep per night   Lives with her fiance hh of 2 H[place] pets    1 dog in the home   Retired [place] ets  But tob 8 per day   etoh ocass 1-2    On disability from her neck surgery predicaments .   orig from [employer] in [place] area.    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 2   Last Pap 2006 last mammogram 2000 and   Has dentures   FA    Social Determinants of Health   Financial Resource Strain: Medium Risk  . Difficulty of Paying Living Expenses: Somewhat hard  Food Insecurity: Not on file  Transportation Needs: [place] Transportation Needs  . Lack of Transportation (Medical): [place]  . Lack of Transportation (Non-Medical): [place]  Physical Activity: Not on file  Stress: Not on file  Social Connections: Not on file   Allergies  Allergen Reactions  . Wellbutrin [Bupropion] Nausea And Vomiting   Family History  Problem Relation Age of Onset  . CAD Father        tumor between heart and lung   . Hypertension Father   . CAD Sister        ? dx   . Hypertension Mother   . Lung cancer Mother        died 53   . Diabetes Maternal Grandmother        late 91s   . Colon cancer Neg Hx   . Colon polyps Neg Hx   . Esophageal cancer Neg Hx   . Rectal cancer Neg Hx   . Stomach cancer Neg Hx     Current Outpatient Medications (Endocrine & Metabolic):  .  predniSONE (DELTASONE) 10 MG tablet, Take  4 each am x 2 days,   2 each am x 2 days,  1 each am x 2 days and stop  Current Outpatient Medications (Cardiovascular):  .  bisoprolol-hydrochlorothiazide (ZIAC) 5-6.25  MG tablet, Take 1 tablet by mouth 2 (two) times daily. Marland Kitchen  cloNIDine (CATAPRES) 0.1 MG tablet, Take 1 tablet by mouth twice daily .  rosuvastatin (CRESTOR) 10 MG tablet, Take 1 tablet (10 mg total) by mouth daily. For high cholesterol,avoid taking with cochicine  Current Outpatient Medications (Respiratory):  .  albuterol (PROAIR HFA) 108 (90 Base) MCG/ACT inhaler, 2 puffs every 4 hours as needed only  if your can't catch your breath .  albuterol (PROVENTIL) (2.5 MG/3ML) 0.083% nebulizer solution, Take 3 mLs (2.5 mg total) by nebulization every 6 (six) hours as needed for wheezing or shortness of breath. .  Budeson-Glycopyrrol-Formoterol (BREZTRI AEROSPHERE) 160-9-4.8 MCG/ACT AERO, Inhale 2 puffs into the lungs 2 (two) times daily. .  Budeson-Glycopyrrol-Formoterol (BREZTRI AEROSPHERE) 160-9-4.8 MCG/ACT AERO, Inhale 2 puffs into the lungs in the morning and at bedtime. Marland Kitchen  loratadine (CLARITIN) 10 MG tablet, Take 10 mg by mouth daily.  Current Outpatient Medications (Analgesics):  .  allopurinol (ZYLOPRIM) 100 MG tablet, Take 1 tablet (100 mg total) by mouth 2 (two) times daily. .  Colchicine 0.6 MG CAPS, Take 0.6 mg by mouth 2 (two) times daily as needed.   Current Outpatient Medications (Other):  .  busPIRone (BUSPAR) 10 MG tablet, TAKE 1 TABLET BY MOUTH THREE TIMES DAILY. MUST HAVE OFFICE VISIT FOR REFILLS .  gabapentin (NEURONTIN) 100 MG capsule, Take 1 capsule (100 mg total) by mouth 2 (two) times daily. Marland Kitchen  gabapentin (NEURONTIN) 300 MG capsule, 1 pill nightly .  omeprazole (PRILOSEC) 40 MG capsule, TAKE 1 CAPSULE BY MOUTH 30 TO 60 MINUTES BEFORE YOUR FIRST AND LAST MEALS OF THE DAY .  ondansetron (ZOFRAN-ODT) 4 MG disintegrating tablet, DISSOLVE 1 TABLET IN MOUTH EVERY 8 HOURS AS NEEDED FOR NAUSEA OR VOMITING PLEASE SCHEDULE A FOLLOW UP APPOINTMENT FOR FURTHER REFILLS. Marland Kitchen  venlafaxine XR (EFFEXOR XR) 75 MG 24 hr capsule, Take 1 capsule (75 mg total) by mouth daily with  breakfast.   Reviewed prior external information including notes and imaging from  primary care provider As well as notes that were available from care everywhere and other healthcare systems.  Past medical history, social, surgical and family history all reviewed in electronic medical record.  [place] pertanent information unless stated regarding to the chief complaint.   Review of Systems:  [place]inton headache, visual changes, nausea, vomiting, diarrhea, constipation, dizziness, abdominal pain, skin rash, fevers, chills, night sweats, weight loss, swollen lymph nodes,  joint swelling, chest pain, shortness of breath, mood changes. POSITIVE muscle aches, body aches  Objective  Blood pressure (!) 142/92, pulse 83, height 5\' 5"  (1.651 m), weight 232 lb (105.2 kg), SpO2 96 %.   General: Hinton apparent distress alert and oriented x3 mood and affect normal, dressed appropriately.  HEENT: Pupils equal, extraocular movements intact  Respiratory: Patient's speak in full sentences and does not appear short of breath  Cardiovascular: Trace lower extremity edema, non tender, Hinton erythema  Gait antalgic Patient's low back does have osteoporosis.  Worsening pain in the back but with anything with extension greater than 5 degrees.  Tightness with straight leg test.  Neurovascularly intact distally but patient does have some mild weakness with 4 out of 5 strength of dorsiflexion of the feet bilaterally.  Trace effusion noted of the lower extremity.   Impression and Recommendations:     The above documentation has been reviewed and is accurate and complete Lyndal Pulley, DO

## 2020-06-08 ENCOUNTER — Ambulatory Visit: Payer: Medicare Other | Admitting: Family Medicine

## 2020-06-08 ENCOUNTER — Encounter: Payer: Self-pay | Admitting: Family Medicine

## 2020-06-08 ENCOUNTER — Other Ambulatory Visit: Payer: Self-pay

## 2020-06-08 DIAGNOSIS — G90521 Complex regional pain syndrome I of right lower limb: Secondary | ICD-10-CM

## 2020-06-08 NOTE — Assessment & Plan Note (Signed)
Will place referral for weight and wellness.  Patient does feel that her back pain could be contributing secondary to her weight.  I do think that this is very reasonable.

## 2020-06-08 NOTE — Patient Instructions (Addendum)
MRI [phone] to schedule Continue gabapentin  Referral to weight management-They will call you Code for MyChart  I'll talk to you thru MyChart

## 2020-06-08 NOTE — Assessment & Plan Note (Signed)
Patient initially did respond well to the Effexor.  We will continue noted to have some back pain.  Feel at this point that that could be beneficial.  We also discussed the potential for additional imaging patient like to start with the MRI and then further evaluate if patient does not respond as well.  Does have a history of back surgery.  Patient is requesting pain to seek medical attention immediately.  Patient is still working on some of her other medications at this point and was found to have elevated uric acid levels.

## 2020-06-24 ENCOUNTER — Telehealth: Payer: Self-pay | Admitting: Pharmacist

## 2020-06-24 NOTE — Chronic Care Management (AMB) (Signed)
Chronic Care Management Pharmacy Assistant   Name: Samantha Hinton  MRN: [id number] DOB: [birth year]   Reason for Encounter: Medication Review/ Medication Coordination Call.     Recent office visits:  05/09/20 Shanon Ace MD (PCP) - presented to clinic annual exam and other chronic conditions. No medication changes. Follow up labs and office visit in 3 months.   Recent consult visits:  06/08/20 Hulan Saas DO (Sports Medicine) presented to clinic for morbid obesity. No medication changes. Referral to medical weight management placed. No follow up noted.   05/05/20 Christinia Gully MD (PCP) presented to clinic COPD Gold and hypertension. Patient started on prednisone 10mg  take 4 each am for 2 days and then taper down. Changed gabapentin to 100mg  2 times daily. Discontinued multivitamin. Follow up with PCP.   Hospital visits:  None in previous 6 months  Medications: Outpatient Encounter Medications as of 06/24/2020  Medication Sig  . albuterol (PROAIR HFA) 108 (90 Base) MCG/ACT inhaler 2 puffs every 4 hours as needed only  if your can't catch your breath  . albuterol (PROVENTIL) (2.5 MG/3ML) 0.083% nebulizer solution Take 3 mLs (2.5 mg total) by nebulization every 6 (six) hours as needed for wheezing or shortness of breath.  . allopurinol (ZYLOPRIM) 100 MG tablet Take 1 tablet (100 mg total) by mouth 2 (two) times daily.  . bisoprolol-hydrochlorothiazide (ZIAC) 5-6.25 MG tablet Take 1 tablet by mouth 2 (two) times daily.  . Budeson-Glycopyrrol-Formoterol (BREZTRI AEROSPHERE) 160-9-4.8 MCG/ACT AERO Inhale 2 puffs into the lungs 2 (two) times daily.  . Budeson-Glycopyrrol-Formoterol (BREZTRI AEROSPHERE) 160-9-4.8 MCG/ACT AERO Inhale 2 puffs into the lungs in the morning and at bedtime.  . busPIRone (BUSPAR) 10 MG tablet TAKE 1 TABLET BY MOUTH THREE TIMES DAILY. MUST HAVE OFFICE VISIT FOR REFILLS  . cloNIDine (CATAPRES) 0.1 MG tablet Take 1 tablet by mouth twice daily  . Colchicine 0.6 MG  CAPS Take 0.6 mg by mouth 2 (two) times daily as needed.  . gabapentin (NEURONTIN) 100 MG capsule Take 1 capsule (100 mg total) by mouth 2 (two) times daily.  Marland Kitchen gabapentin (NEURONTIN) 300 MG capsule 1 pill nightly  . loratadine (CLARITIN) 10 MG tablet Take 10 mg by mouth daily.  Marland Kitchen omeprazole (PRILOSEC) 40 MG capsule TAKE 1 CAPSULE BY MOUTH 30 TO 60 MINUTES BEFORE YOUR FIRST AND LAST MEALS OF THE DAY  . ondansetron (ZOFRAN-ODT) 4 MG disintegrating tablet DISSOLVE 1 TABLET IN MOUTH EVERY 8 HOURS AS NEEDED FOR NAUSEA OR VOMITING PLEASE SCHEDULE A FOLLOW UP APPOINTMENT FOR FURTHER REFILLS.  Marland Kitchen predniSONE (DELTASONE) 10 MG tablet Take  4 each am x 2 days,   2 each am x 2 days,  1 each am x 2 days and stop  . rosuvastatin (CRESTOR) 10 MG tablet Take 1 tablet (10 mg total) by mouth daily. For high cholesterol,avoid taking with cochicine  . venlafaxine XR (EFFEXOR XR) 75 MG 24 hr capsule Take 1 capsule (75 mg total) by mouth daily with breakfast.   No facility-administered encounter medications on file as of 06/24/2020.    Reviewed chart for medication changes ahead of medication coordination call.  No OVs, Consults, or hospital visits since last care coordination call/Pharmacist visit. (If appropriate, list visit date, provider name)  No medication changes indicated OR if recent visit, treatment plan here.  BP Readings from Last 3 Encounters:  06/08/20 (!) 142/92  05/09/20 (!) 158/80  05/05/20 (!) 166/102    Lab Results  Component Value Date   HGBA1C 6.0  05/09/2020     Patient obtains medications through Adherence Packaging  30 Days   Last adherence delivery included: None.   Patient is due for next adherence delivery on: 07/05/20.  Called patient and reviewed medications and coordinated delivery.  This delivery to include:   Multiple unsuccessful attempts to reach patient by phone. Cleared documentation due to not able to confirm delivery or medications needed/not needed.   Star  Rating Drugs:  Rosuvastatin 10mg  -  N/A  [place] [phone]

## 2020-06-27 NOTE — Telephone Encounter (Cosign Needed)
2 attempts.

## 2020-06-29 NOTE — Telephone Encounter (Cosign Needed)
3rd attempt

## 2020-06-30 ENCOUNTER — Other Ambulatory Visit: Payer: Self-pay | Admitting: Internal Medicine

## 2020-07-01 ENCOUNTER — Telehealth: Payer: Self-pay | Admitting: Pharmacist

## 2020-07-01 NOTE — Chronic Care Management (AMB) (Signed)
Chronic Care Management Pharmacy Assistant   Name: Samantha Hinton  MRN: [id number] DOB: [birth year]  Reason for Encounter: Disease State/General Assessment Call.    Conditions to be addressed/monitored: HTN, HLD, COPD and DMII.  Recent office visits:  05/09/20 Shanon Ace MD (PCP) - presented to clinic annual exam and other chronic conditions. No medication changes. Follow up labs and office visit in 3 months.   Recent consult visits:  06/08/20 Hulan Saas DO (Sports Medicine) presented to clinic for morbid obesity. No medication changes. Referral to medical weight management placed. No follow up noted.  05/05/20 Christinia Gully MD (PCP) presented to clinic COPD Gold and hypertension. Patient started on prednisone 10mg  take 4 each am for 2 days and then taper down. Changed gabapentin to 100mg  2 times daily. Discontinued multivitamin. Follow up with PCP.    Hospital visits:  None in previous 6 months  Medications: Outpatient Encounter Medications as of 07/01/2020  Medication Sig  . albuterol (PROAIR HFA) 108 (90 Base) MCG/ACT inhaler 2 puffs every 4 hours as needed only  if your can't catch your breath  . albuterol (PROVENTIL) (2.5 MG/3ML) 0.083% nebulizer solution Take 3 mLs (2.5 mg total) by nebulization every 6 (six) hours as needed for wheezing or shortness of breath.  . allopurinol (ZYLOPRIM) 100 MG tablet Take 1 tablet (100 mg total) by mouth 2 (two) times daily.  . bisoprolol-hydrochlorothiazide (ZIAC) 5-6.25 MG tablet Take 1 tablet by mouth 2 (two) times daily.  Marland Kitchen BREZTRI AEROSPHERE 160-9-4.8 MCG/ACT AERO INHALE TWO PUFFS BY MOUTH INTO LUNGS TWICE DAILY  . Budeson-Glycopyrrol-Formoterol (BREZTRI AEROSPHERE) 160-9-4.8 MCG/ACT AERO Inhale 2 puffs into the lungs 2 (two) times daily.  . Budeson-Glycopyrrol-Formoterol (BREZTRI AEROSPHERE) 160-9-4.8 MCG/ACT AERO Inhale 2 puffs into the lungs in the morning and at bedtime.  . busPIRone (BUSPAR) 10 MG tablet TAKE 1 TABLET BY MOUTH  THREE TIMES DAILY. MUST HAVE OFFICE VISIT FOR REFILLS  . cloNIDine (CATAPRES) 0.1 MG tablet Take 1 tablet by mouth twice daily  . Colchicine 0.6 MG CAPS Take 0.6 mg by mouth 2 (two) times daily as needed.  . gabapentin (NEURONTIN) 100 MG capsule Take 1 capsule (100 mg total) by mouth 2 (two) times daily.  Marland Kitchen gabapentin (NEURONTIN) 300 MG capsule 1 pill nightly  . loratadine (CLARITIN) 10 MG tablet Take 10 mg by mouth daily.  Marland Kitchen omeprazole (PRILOSEC) 40 MG capsule TAKE 1 CAPSULE BY MOUTH 30 TO 60 MINUTES BEFORE YOUR FIRST AND LAST MEALS OF THE DAY  . ondansetron (ZOFRAN-ODT) 4 MG disintegrating tablet DISSOLVE 1 TABLET IN MOUTH EVERY 8 HOURS AS NEEDED FOR NAUSEA OR VOMITING PLEASE SCHEDULE A FOLLOW UP APPOINTMENT FOR FURTHER REFILLS.  Marland Kitchen predniSONE (DELTASONE) 10 MG tablet Take  4 each am x 2 days,   2 each am x 2 days,  1 each am x 2 days and stop  . rosuvastatin (CRESTOR) 10 MG tablet Take 1 tablet (10 mg total) by mouth daily. For high cholesterol,avoid taking with cochicine  . venlafaxine XR (EFFEXOR XR) 75 MG 24 hr capsule Take 1 capsule (75 mg total) by mouth daily with breakfast.   No facility-administered encounter medications on file as of 07/01/2020.    . Current COPD regimen:             Albuterol (PROAIR) 108 - take 2 puffs every 4 hours as needed.       Albuterol (PROVENTIL) Solution - breathing treatments as  needed.      Breztri 160-9-4.8 - inhale two puffs twice daily.   . No flowsheet data found.  . Any recent hospitalizations or ED visits since last visit with CPP? No  . Denies COPD symptoms.  . What recent interventions/DTPs have been made by any provider to improve breathing since last visit: Started on prednisone dose pak.   . Have you had exacerbation/flare-up since last visit? Yes  . What do you do when you are short of breath?  Adhere to COPD Action Plan.  Respiratory Devices/Equipment . Do you have a nebulizer? Yes . Do you use a Peak Flow Meter?  No . Do you use a maintenance inhaler? Yes . How often do you forget to use your daily inhaler? Never.  . Do you use a rescue inhaler? Yes . How often do you use your rescue inhaler?  prn. Patient states she has not had to use but maybe once a day now since [place] and Prednisone.  . Do you use a spacer with your inhaler? No  Adherence Review: . Does the patient have >5 day gap between last estimated fill date for maintenance inhaler medications? No  Reviewed chart prior to disease state call. Spoke with patient regarding BP  Recent Office Vitals: BP Readings from Last 3 Encounters:  06/08/20 (!) 142/92  05/09/20 (!) 158/80  05/05/20 (!) 166/102   Pulse Readings from Last 3 Encounters:  06/08/20 83  05/09/20 78  05/05/20 80    Wt Readings from Last 3 Encounters:  06/08/20 232 lb (105.2 kg)  05/09/20 232 lb 6.4 oz (105.4 kg)  05/05/20 232 lb (105.2 kg)     Kidney Function Lab Results  Component Value Date/Time   CREATININE 1.01 05/09/2020 10:22 AM   CREATININE 0.86 06/30/2019 09:09 AM   GFR 58.85 (L) 05/09/2020 10:22 AM   GFRNONAA >60 12/27/2014 07:57 AM   GFRAA >60 12/27/2014 07:57 AM    BMP Latest Ref Rng & Units 05/09/2020 06/30/2019 04/10/2019  Glucose 70 - 99 mg/dL 114(H) 127(H) 122(H)  BUN 6 - 23 mg/dL 22 14 -  Creatinine 0.40 - 1.20 mg/dL 1.01 0.86 -  Sodium 135 - 145 mEq/L 137 137 -  Potassium 3.5 - 5.1 mEq/L 4.5 3.9 -  Chloride 96 - 112 mEq/L 93(L) 96 -  CO2 19 - 32 mEq/L 33(H) 28 -  Calcium 8.4 - 10.5 mg/dL 9.5 9.0 -    . Current antihypertensive regimen:   Bisoprolol-HCTZ 5-6.25 mg 1 tablet twice daily  Clonidine 0.1 mg 1 tablet twice daily  . How often are you checking your Blood Pressure? infrequently  . Current home BP readings: None to report. I encouraged patient to check blood pressure weekly and document readings for future appointments. Patient verbalized understanding and was agreeable.   . What recent interventions/DTPs have been made by  any provider to improve Blood Pressure control since last CPP Visit: None.   . Any recent hospitalizations or ED visits since last visit with CPP? No  . What diet changes have been made to improve Blood Pressure Control?  o Patient states she does not add salt to anything but she does still eat foods that are not necessarily goo for her or low in sodium. Patient states she eats a combination of meats such as red meat, white meat, fish and things like that. Patient states she is trying to eat better. Patient states she will be going to a weight loss clinic soon and is hopeful to  get help to lose weight and get on a better diet.   . What exercise is being done to improve your Blood Pressure Control?  o Patient states she currently is not able to get much activity due to her weight being up and it causing her joints to ache. Patient states she is going to be going to a weight loss clinic soon and is hopeful to lose some weight so she can start being more active and walking out side. Patient states she is able to do things at home like dishes, take out the trash and check mail.   Adherence Review: Is the patient currently on ACE/ARB medication? No Does the patient have >5 day gap between last estimated fill dates? No   Star Rating Drugs:    Rosuvastatin 10mg  - no fill history in listed patient starts on Tuesday 07/05/20.   [place] [phone]

## 2020-07-08 ENCOUNTER — Telehealth: Payer: Self-pay | Admitting: Family Medicine

## 2020-07-08 ENCOUNTER — Other Ambulatory Visit: Payer: Self-pay

## 2020-07-08 ENCOUNTER — Telehealth: Payer: Self-pay | Admitting: Internal Medicine

## 2020-07-08 MED ORDER — CLONIDINE HCL 0.1 MG PO TABS: 0.1000 mg | ORAL_TABLET | Freq: Two times a day (BID) | ORAL | 11 refills | Status: DC

## 2020-07-08 MED ORDER — GABAPENTIN 100 MG PO CAPS: 100.0000 mg | ORAL_CAPSULE | Freq: Two times a day (BID) | ORAL | 0 refills | Status: DC

## 2020-07-08 NOTE — Telephone Encounter (Signed)
Patient is requesting refill on Clonidine 0.6mg  be sent to [facility]. Patient last refill was 11/2019 and last OV was 03/22. Will route to Dr. Melvyn Novas.  Dr. Melvyn Novas, please advise on refill. Thanks!

## 2020-07-08 NOTE — Telephone Encounter (Signed)
Rx corrected per a verbal from Dr. Tamala Julian. Spoke with pharmacy and patient.

## 2020-07-08 NOTE — Telephone Encounter (Signed)
Per MW's overview under HTN  Changed from acei to bystolic 06/04/36 - increased ziac to bid 09/10/2018  - 12/29/2018 added clonidine 0.1 bid for chronic pain/hbp   AVS from 12/29/2018  Instructions  Start clonidine 0.1 mg twice daily  - can make mouth dry but should help your back pain (use jolly ranchers)   Make appt to see Dr Regis Bill about your abdominal pain    Please schedule a follow up visit in 6  months but call sooner if needed       Rx refilled and I spoke with the pt and notified that this was done. Nothing further needed.

## 2020-07-08 NOTE — Telephone Encounter (Signed)
Pt uses [facility], they send her monthly prescriptions in daily dose packs. BUT, they seem to have the gabapentin dosage wrong ( or from a previous rx) and are dosing her incorrectly.  Can we contact the pharmacy to correct, what is in Epic is right ( 100 mg twice a day ), but they are sending her double this amount and stating that the RX is written this way.

## 2020-07-13 ENCOUNTER — Telehealth: Payer: Self-pay | Admitting: Internal Medicine

## 2020-07-13 NOTE — Telephone Encounter (Signed)
Left message for patient to call back and schedule Medicare Annual Wellness Visit (AWV) either virtually or in office.   Last AWV 07/06/15  please schedule at anytime with [facility] Nurse Health Advisor 1 or 2   This should be a 45 minute visit.

## 2020-07-14 ENCOUNTER — Encounter (INDEPENDENT_AMBULATORY_CARE_PROVIDER_SITE_OTHER): Payer: Self-pay | Admitting: Family Medicine

## 2020-07-14 ENCOUNTER — Ambulatory Visit (INDEPENDENT_AMBULATORY_CARE_PROVIDER_SITE_OTHER): Payer: Medicare Other | Admitting: Family Medicine

## 2020-07-14 ENCOUNTER — Other Ambulatory Visit: Payer: Self-pay

## 2020-07-14 VITALS — BP 140/86 | HR 87 | Temp 98.4°F | Ht 64.0 in | Wt 230.0 lb

## 2020-07-14 DIAGNOSIS — K760 Fatty (change of) liver, not elsewhere classified: Secondary | ICD-10-CM | POA: Diagnosis not present

## 2020-07-14 DIAGNOSIS — I10 Essential (primary) hypertension: Secondary | ICD-10-CM | POA: Insufficient documentation

## 2020-07-14 DIAGNOSIS — F3289 Other specified depressive episodes: Secondary | ICD-10-CM | POA: Diagnosis not present

## 2020-07-14 DIAGNOSIS — J449 Chronic obstructive pulmonary disease, unspecified: Secondary | ICD-10-CM | POA: Diagnosis not present

## 2020-07-14 DIAGNOSIS — R5383 Other fatigue: Secondary | ICD-10-CM | POA: Diagnosis not present

## 2020-07-14 DIAGNOSIS — R0602 Shortness of breath: Secondary | ICD-10-CM

## 2020-07-14 DIAGNOSIS — Z9189 Other specified personal risk factors, not elsewhere classified: Secondary | ICD-10-CM

## 2020-07-14 DIAGNOSIS — Z0289 Encounter for other administrative examinations: Secondary | ICD-10-CM

## 2020-07-14 DIAGNOSIS — J209 Acute bronchitis, unspecified: Secondary | ICD-10-CM | POA: Insufficient documentation

## 2020-07-14 DIAGNOSIS — R6889 Other general symptoms and signs: Secondary | ICD-10-CM | POA: Diagnosis not present

## 2020-07-14 DIAGNOSIS — Z6839 Body mass index (BMI) 39.0-39.9, adult: Secondary | ICD-10-CM

## 2020-07-14 DIAGNOSIS — E7849 Other hyperlipidemia: Secondary | ICD-10-CM | POA: Diagnosis not present

## 2020-07-14 DIAGNOSIS — F39 Unspecified mood [affective] disorder: Secondary | ICD-10-CM | POA: Insufficient documentation

## 2020-07-15 LAB — VITAMIN D 25 HYDROXY (VIT D DEFICIENCY, FRACTURES): Vit D, 25-Hydroxy: 24.6 ng/mL — ABNORMAL LOW (ref 30.0–100.0)

## 2020-07-15 LAB — VITAMIN B12: Vitamin B-12: 403 pg/mL (ref 232–1245)

## 2020-07-15 LAB — FOLATE: Folate: 12.1 ng/mL (ref >3.0)

## 2020-07-15 LAB — HEMOGLOBIN A1C
Est. average glucose Bld gHb Est-mCnc: 131 mg/dL
Hgb A1c MFr Bld: 6.2 % — ABNORMAL HIGH (ref 4.8–5.6)

## 2020-07-15 LAB — INSULIN, RANDOM: INSULIN: 6.1 u[IU]/mL (ref 2.6–24.9)

## 2020-07-25 ENCOUNTER — Telehealth: Payer: Self-pay | Admitting: Pharmacist

## 2020-07-25 NOTE — Chronic Care Management (AMB) (Addendum)
Chronic Care Management Pharmacy Assistant   Name: Samantha Hinton  MRN: [id number] DOB: May 04, 1955  Reason for Encounter: Medication Review/ Medication Coordination Call.    Recent office visits:  07/14/20 [name] DO [facility]) - seen for fatigue and other chronic conditions. Discontinued ondansetron 4mg . Follow up in 2 weeks.   Recent consult visits:  None.   Hospital visits:  None in previous 6 months  Medications: Outpatient Encounter Medications as of 07/25/2020  Medication Sig Note   albuterol (PROAIR HFA) 108 (90 Base) MCG/ACT inhaler 2 puffs every 4 hours as needed only  if your can't catch your breath    albuterol (PROVENTIL) (2.5 MG/3ML) 0.083% nebulizer solution Take 3 mLs (2.5 mg total) by nebulization every 6 (six) hours as needed for wheezing or shortness of breath.    allopurinol (ZYLOPRIM) 100 MG tablet Take 1 tablet (100 mg total) by mouth 2 (two) times daily.    bisoprolol-hydrochlorothiazide (ZIAC) 5-6.25 MG tablet Take 1 tablet by mouth 2 (two) times daily.    BREZTRI AEROSPHERE 160-9-4.8 MCG/ACT AERO INHALE TWO PUFFS BY MOUTH INTO LUNGS TWICE DAILY    busPIRone (BUSPAR) 10 MG tablet TAKE 1 TABLET BY MOUTH THREE TIMES DAILY. MUST HAVE OFFICE VISIT FOR REFILLS (Patient not taking: Reported on 07/14/2020)    cloNIDine (CATAPRES) 0.1 MG tablet Take 1 tablet (0.1 mg total) by mouth 2 (two) times daily.    Colchicine 0.6 MG CAPS Take 0.6 mg by mouth 2 (two) times daily as needed. 07/14/2020: Using prn   gabapentin (NEURONTIN) 100 MG capsule Take 1 capsule (100 mg total) by mouth 2 (two) times daily.    gabapentin (NEURONTIN) 300 MG capsule 1 pill nightly    loratadine (CLARITIN) 10 MG tablet Take 10 mg by mouth daily.    omeprazole (PRILOSEC) 40 MG capsule TAKE 1 CAPSULE BY MOUTH 30 TO 60 MINUTES BEFORE YOUR FIRST AND LAST MEALS OF THE DAY    predniSONE (DELTASONE) 10 MG tablet Take  4 each am x 2 days,   2 each am x 2 days,  1 each am x 2 days and stop     rosuvastatin (CRESTOR) 10 MG tablet Take 1 tablet (10 mg total) by mouth daily. For high cholesterol,avoid taking with cochicine    venlafaxine XR (EFFEXOR XR) 75 MG 24 hr capsule Take 1 capsule (75 mg total) by mouth daily with breakfast.    No facility-administered encounter medications on file as of 07/25/2020.    Reviewed chart for medication changes ahead of medication coordination call.  No OVs, Consults, or hospital visits since last care coordination call/Pharmacist visit. (If appropriate, list visit date, provider name)  No medication changes indicated OR if recent visit, treatment plan here.  BP Readings from Last 3 Encounters:  07/14/20 140/86  06/08/20 (!) 142/92  05/09/20 (!) 158/80    Lab Results  Component Value Date   HGBA1C 6.2 (H) 07/14/2020     Patient obtains medications through Adherence Packaging  30 Days   Patient is due for next adherence delivery on: 08/03/20. Called patient and reviewed medications and coordinated delivery.  This delivery to include: Gabapentin 100mg  - take 1 tablet in the am and 1 tablet with dinner.  Gabapentin 300mg  - take 1 tablet at bedtime.  Prednisone 10mg  - take as needed for flare ups with breathing.  Albuterol PROAIR inhaler - take 2 puffs every 4 hours as needed.  loratidine 10mg  - take daily in the morning  Venlafaxine 75mg  -  take 1 tablet in the morning Omeprazole 40mg   - take 1 capsule twice daily in the morning and with dinner Rosuvastatin 10mg  - take 1 tablet with dinner Allopurinol 100mg  - take 1 tablet twice daily in the morning and with dinner bisoprolol- HCTZ 5-6.25MG  - take 1 tablet twice daily in the morning and with dinner Breztri inhaler - 2 puffs twice daily Clonidine 0.1mg  - take 1 tablet twice daily in the morning and with dinner  Patient declined the following medications (meds) due to (reason) Albuterol solution vials -  As needed for nebulizer treatments.   Confirmed delivery date of 08/03/20, advised  patient that pharmacy will contact them the morning of delivery.  Star Rating Drugs:  Rosuvastatin 10mg  - last filled on 07/04/20 30DS at [place] Pharmacist Assistant [phone]

## 2020-07-26 NOTE — Progress Notes (Signed)
Dear Dr. Regis Bill,   Thank you for referring Samantha Hinton to our clinic. The following note includes my evaluation and treatment recommendations.  Chief Complaint:   OBESITY Samantha Hinton (MR# [id number]) is a 65 y.o. female who presents for evaluation and treatment of obesity and related comorbidities. Current BMI is Body mass index is 39.48 kg/m. Samantha Hinton has been struggling with her weight for many years and has been unsuccessful in either losing weight, maintaining weight loss, or reaching her healthy weight goal.  Samantha Hinton is currently in the action stage of change and ready to dedicate time achieving and maintaining a healthier weight. Samantha Hinton is interested in becoming our patient and working on intensive lifestyle modifications including (but not limited to) diet and exercise for weight loss.  Samantha Hinton is retired.  She lives with her life partner, Samantha Hinton.  Craves potatoes, bread, and donuts.  Skips breakfast and lunch most days.  Worst habit is eating only 1 large meal per day.  Samantha Hinton's habits were reviewed today and are as follows: Her family eats meals together, she thinks her family will eat healthier with her, her desired weight loss is 65 pounds, she started gaining weight over the last 5 years, she craves potatoes, bread, and donuts, she skips breakfast and lunch frequently, she frequently makes poor food choices and she frequently eats larger portions than normal.  Depression Screen Samantha Hinton's Food and Mood (modified PHQ-9) score was 16.  Depression screen Samantha Hinton 2/9 07/14/2020  Decreased Interest 3  Down, Depressed, Hopeless 2  PHQ - 2 Score 5  Altered sleeping 3  Tired, decreased energy 3  Change in appetite 1  Feeling bad or failure about yourself  2  Trouble concentrating 1  Moving slowly or fidgety/restless 1  Suicidal thoughts 0  PHQ-9 Score 16  Difficult doing work/chores Somewhat difficult   Assessment/Plan:   Orders Placed This Encounter  Procedures    Vitamin B12   Folate   Hemoglobin A1c   Insulin, random   VITAMIN D 25 Hydroxy (Vit-D Deficiency, Fractures)   EKG 12-Lead    Medications Discontinued During This Encounter  Medication Reason   Budeson-Glycopyrrol-Formoterol (BREZTRI AEROSPHERE) 160-9-4.8 MCG/ACT AERO    Budeson-Glycopyrrol-Formoterol (BREZTRI AEROSPHERE) 160-9-4.8 MCG/ACT AERO    ondansetron (ZOFRAN-ODT) 4 MG disintegrating tablet     1. Other fatigue Samantha Hinton denies daytime somnolence and admits to waking up still tired. Patent has a history of symptoms of morning fatigue, morning headache and snoring. Samantha Hinton generally gets 4 or 5 hours of sleep per night, and states that she has poor quality sleep. Snoring is present. Apneic episodes are present. Epworth Sleepiness Score is 4.  Samantha Hinton does feel that her weight is causing her energy to be lower than it should be. Fatigue may be related to obesity, depression or many other causes. Labs will be ordered, and in the meanwhile, Samantha Hinton will focus on self care including making healthy food choices, increasing physical activity and focusing on stress reduction.  Will check EKG and labs today.  - EKG 12-Lead - Vitamin B12 - Folate - Hemoglobin A1c - Insulin, random - VITAMIN D 25 Hydroxy (Vit-D Deficiency, Fractures)  2. SOBOE (shortness of breath on exertion) Samantha Hinton notes increasing shortness of breath with exercising and seems to be worsening over time with weight gain. She notes getting out of breath sooner with activity than she used to. This has gotten worse recently. Samantha Hinton denies shortness of breath at rest or orthopnea.  Samantha Hinton does feel that  she gets out of breath more easily that she used to when she exercises. Samantha Hinton's shortness of breath appears to be obesity related and exercise induced. She has agreed to work on weight loss and gradually increase exercise to treat her exercise induced shortness of breath. Will continue to monitor closely.  Check IC  today.  3. Essential hypertension At goal. Medications: Ziac 5-6.25 mg twice daily, clonidine 0.1 mg twice daily.   Plan: Avoid buying foods that are: processed, frozen, or prepackaged to avoid excess salt. We will continue to monitor closely alongside her PCP and/or Specialist.  Regular follow up with PCP and specialists was also encouraged.  Will check labs today, as per below.  BP Readings from Last 3 Encounters:  07/14/20 140/86  06/08/20 (!) 142/92  05/09/20 (!) 158/80   Lab Results  Component Value Date   CREATININE 1.01 05/09/2020   - Vitamin B12 - Folate - Hemoglobin A1c - Insulin, random - VITAMIN D 25 Hydroxy (Vit-D Deficiency, Fractures)  4. Other hyperlipidemia Course: Not at goal. Lipid-lowering medications: Crestor 10 mg daily.   Plan: Dietary changes: Increase soluble fiber, decrease simple carbohydrates, decrease saturated fat. Exercise changes: Moderate to vigorous-intensity aerobic activity 150 minutes per week or as tolerated. We will continue to monitor along with PCP/specialists as it pertains to her weight loss journey.  Lab Results  Component Value Date   CHOL 330 (H) 05/09/2020   HDL 63.00 05/09/2020   LDLCALC 116 (H) 05/24/2014   LDLDIRECT 242.0 05/09/2020   TRIG 255.0 (H) 05/09/2020   CHOLHDL 5 05/09/2020   Lab Results  Component Value Date   ALT 35 05/09/2020   AST 58 (H) 05/09/2020   ALKPHOS 79 05/09/2020   BILITOT 1.1 05/09/2020   5. Chronic obstructive pulmonary disease, unspecified COPD type ([place]) Smoked for over 40 years and quit in 2017.  She is followed by Dr. Melvyn Novas of Pulmonology.  Taking Breztri and albuterol.  Plan:  Continue to follow with Pulmonology.   6. NAFLD (nonalcoholic fatty liver disease) NAFLD is an umbrella term that encompasses a disease spectrum that includes steatosis (fat) without inflammation, steatohepatitis (NASH; fat + inflammation in a characteristic pattern), and cirrhosis. Bland steatosis is felt to be a  benign condition, with extremely low to no risk of progression to cirrhosis, whereas NASH can progress to cirrhosis. The mainstay of treatment of NAFLD includes lifestyle modification to achieve weight loss, at least 7% of current body weight. Low carbohydrate diets can be beneficial in improving NAFLD liver histology. Additionally, exercise, even the absence of weight loss can have beneficial effects on the patient's metabolic profile and liver health.   7. Other depression, with emotional eating Not optimized. Medication: Effexor XR 75 mg daily, Buspar 10 mg three times daily.  She says that her GAD is worse than her depression.  PHQ-9 is 16.  Plan:  Continue medications. Behavior modification techniques were discussed today to help deal with emotional/non-hunger eating behaviors.  8. Obesity, current BMI 39.5  Samantha Hinton is currently in the action stage of change and her goal is to continue with weight loss efforts. I recommend Mayelin begin the structured treatment plan as follows:  She has agreed to the Category 3 Plan.  Exercise goals:  As is.    Behavioral modification strategies: meal planning and cooking strategies, keeping healthy foods in the home and planning for success.  She was informed of the importance of frequent follow-up visits to maximize her success with intensive lifestyle modifications for her  multiple health conditions. She was informed we would discuss her lab results at her next visit unless there is a critical issue that needs to be addressed sooner. Samantha Hinton agreed to keep her next visit at the agreed upon time to discuss these results.  Objective:   Blood pressure 140/86, pulse 87, temperature 98.4 F (36.9 C), height 5\' 4"  (1.626 m), weight 230 lb (104.3 kg), SpO2 95 %. Body mass index is 39.48 kg/m.  EKG: Normal sinus rhythm, rate 88 bpm.  Indirect Calorimeter completed today shows a VO2 of 344 and a REE of 2395.  Her calculated basal metabolic rate is 8676 thus  her basal metabolic rate is better than expected.  General: Cooperative, alert, well developed, in no acute distress. HEENT: Conjunctivae and lids unremarkable. Cardiovascular: Regular rhythm.  Lungs: Normal work of breathing. Neurologic: No focal deficits.   Lab Results  Component Value Date   CREATININE 1.01 05/09/2020   BUN 22 05/09/2020   NA 137 05/09/2020   K 4.5 05/09/2020   CL 93 (L) 05/09/2020   CO2 33 (H) 05/09/2020   Lab Results  Component Value Date   ALT 35 05/09/2020   AST 58 (H) 05/09/2020   ALKPHOS 79 05/09/2020   BILITOT 1.1 05/09/2020   Lab Results  Component Value Date   HGBA1C 6.2 (H) 07/14/2020   HGBA1C 6.0 05/09/2020   HGBA1C 6.9 (H) 08/04/2019   Lab Results  Component Value Date   INSULIN 6.1 07/14/2020   Lab Results  Component Value Date   TSH 3.06 05/09/2020   Lab Results  Component Value Date   CHOL 330 (H) 05/09/2020   HDL 63.00 05/09/2020   LDLCALC 116 (H) 05/24/2014   LDLDIRECT 242.0 05/09/2020   TRIG 255.0 (H) 05/09/2020   CHOLHDL 5 05/09/2020   Lab Results  Component Value Date   WBC 6.2 05/09/2020   HGB 13.6 05/09/2020   HCT 41.2 05/09/2020   MCV 94.1 05/09/2020   PLT 195.0 05/09/2020   Lab Results  Component Value Date   IRON 112 04/10/2019   FERRITIN 160.8 04/10/2019   Obesity Behavioral Intervention:   Approximately 15 minutes were spent on the discussion below.  ASK: We discussed the diagnosis of obesity with Samantha Hinton today and Samantha Hinton agreed to give [place] permission to discuss obesity behavioral modification therapy today.  ASSESS: Samantha Hinton has the diagnosis of obesity and her BMI today is 39.5. Aaleeyah is in the action stage of change.   ADVISE: [place] was educated on the multiple health risks of obesity as well as the benefit of weight loss to improve her health. She was advised of the need for long term treatment and the importance of lifestyle modifications to improve her current health and to decrease her risk  of future health problems.  AGREE: Multiple dietary modification options and treatment options were discussed and Raylen agreed to follow the recommendations documented in the above note.  ARRANGE: Susi was educated on the importance of frequent visits to treat obesity as outlined per CMS and USPSTF guidelines and agreed to schedule her next follow up appointment today.  Attestation Statements:   This is the patient's first visit at [facility]. The patient's NEW PATIENT PACKET was reviewed at length. Included in the packet: current and past health history, medications, allergies, ROS, gynecologic history (women only), surgical history, family history, social history, weight history, weight loss surgery history (for those that have had weight loss surgery), nutritional evaluation, mood and food questionnaire, PHQ9, Epworth  questionnaire, sleep habits questionnaire, patient life and health improvement goals questionnaire. These will all be scanned into the patient's chart under media.   During the visit, I independently reviewed the patient's EKG, bioimpedance scale results, and indirect calorimeter results. I used this information to tailor a meal plan for the patient that will help her to lose weight and will improve her obesity-related conditions going forward. I performed a medically necessary appropriate examination and/or evaluation. I discussed the assessment and treatment plan with the patient. The patient was provided an opportunity to ask questions and all were answered. The patient agreed with the plan and demonstrated an understanding of the instructions. Labs were ordered at this visit and will be reviewed at the next visit unless more critical results need to be addressed immediately. Clinical information was updated and documented in the EMR.   I, Water quality scientist, CMA, am acting as Location manager for [employer], DO.  I have reviewed the above documentation for  accuracy and completeness, and I agree with the above. Marjory Sneddon, D.O.  The [place] was signed into law in 2016 which includes the topic of electronic health records.  This provides immediate access to information in MyChart.  This includes consultation notes, operative notes, office notes, lab results and pathology reports.  If you have any questions about what you read please let us know at your next visit so we can discuss your concerns and take corrective action if need be.  We are right here with you.

## 2020-07-28 ENCOUNTER — Other Ambulatory Visit: Payer: Self-pay

## 2020-07-28 ENCOUNTER — Ambulatory Visit (INDEPENDENT_AMBULATORY_CARE_PROVIDER_SITE_OTHER): Payer: Medicare Other | Admitting: Family Medicine

## 2020-07-28 ENCOUNTER — Encounter (INDEPENDENT_AMBULATORY_CARE_PROVIDER_SITE_OTHER): Payer: Self-pay | Admitting: Family Medicine

## 2020-07-28 VITALS — BP 136/79 | HR 102 | Temp 98.1°F | Ht 64.0 in | Wt 220.0 lb

## 2020-07-28 DIAGNOSIS — E785 Hyperlipidemia, unspecified: Secondary | ICD-10-CM

## 2020-07-28 DIAGNOSIS — E1159 Type 2 diabetes mellitus with other circulatory complications: Secondary | ICD-10-CM

## 2020-07-28 DIAGNOSIS — I152 Hypertension secondary to endocrine disorders: Secondary | ICD-10-CM

## 2020-07-28 DIAGNOSIS — J449 Chronic obstructive pulmonary disease, unspecified: Secondary | ICD-10-CM

## 2020-07-28 DIAGNOSIS — E1169 Type 2 diabetes mellitus with other specified complication: Secondary | ICD-10-CM

## 2020-07-28 DIAGNOSIS — K760 Fatty (change of) liver, not elsewhere classified: Secondary | ICD-10-CM | POA: Diagnosis not present

## 2020-07-28 DIAGNOSIS — E559 Vitamin D deficiency, unspecified: Secondary | ICD-10-CM

## 2020-07-28 DIAGNOSIS — Z6839 Body mass index (BMI) 39.0-39.9, adult: Secondary | ICD-10-CM | POA: Diagnosis not present

## 2020-07-28 MED ORDER — OZEMPIC (0.25 OR 0.5 MG/DOSE) 2 MG/1.5ML ~~LOC~~ SOPN: 0.2500 mg | PEN_INJECTOR | SUBCUTANEOUS | 0 refills | Status: DC

## 2020-07-28 MED ORDER — VITAMIN D (ERGOCALCIFEROL) 1.25 MG (50000 UNIT) PO CAPS: 50000.0000 [IU] | ORAL_CAPSULE | ORAL | 0 refills | Status: DC

## 2020-08-01 ENCOUNTER — Telehealth: Payer: Self-pay | Admitting: Pharmacist

## 2020-08-01 DIAGNOSIS — E559 Vitamin D deficiency, unspecified: Secondary | ICD-10-CM | POA: Insufficient documentation

## 2020-08-01 DIAGNOSIS — E785 Hyperlipidemia, unspecified: Secondary | ICD-10-CM | POA: Insufficient documentation

## 2020-08-01 DIAGNOSIS — E1159 Type 2 diabetes mellitus with other circulatory complications: Secondary | ICD-10-CM | POA: Insufficient documentation

## 2020-08-01 DIAGNOSIS — E1169 Type 2 diabetes mellitus with other specified complication: Secondary | ICD-10-CM | POA: Insufficient documentation

## 2020-08-01 DIAGNOSIS — E119 Type 2 diabetes mellitus without complications: Secondary | ICD-10-CM | POA: Insufficient documentation

## 2020-08-01 DIAGNOSIS — J449 Chronic obstructive pulmonary disease, unspecified: Secondary | ICD-10-CM | POA: Insufficient documentation

## 2020-08-01 DIAGNOSIS — Z9189 Other specified personal risk factors, not elsewhere classified: Secondary | ICD-10-CM | POA: Insufficient documentation

## 2020-08-01 NOTE — Chronic Care Management (AMB) (Signed)
    Chronic Care Management Pharmacy Assistant   Name: Samantha Hinton  MRN: [id number] DOB: 1955/05/24   Reason for Encounter: Patient Assistance Application completion for Ozempic per Jeni Salles.  Spoke with patient upon her call back and informed her to keep an eye out for the application in the mail and when she receives to complete her portion of the application as far as income information, signing at appropriate locations and and to make sure to fill in her social security number in appropriate places. Patient verbalized understanding.  Sent application to [employer] with Jeni Salles instructions to print and mail to patient. Okay per Pattricia Boss PTM.  [place]  Clinical Pharmacist Assistant [phone]

## 2020-08-04 NOTE — Telephone Encounter (Cosign Needed)
2nd attempt

## 2020-08-04 NOTE — Progress Notes (Signed)
Chief Complaint:   OBESITY Samantha Hinton is here to discuss her progress with her obesity treatment plan along with follow-up of her obesity related diagnoses.   Today's visit was #: 2 Starting weight: 230 lbs Starting date: 07/14/2020 Today's weight: 220 lbs Today's date: 07/28/2020 Weight change since last visit: 10 lbs Total lbs lost to date: 10 lbs Body mass index is 37.76 kg/m.  Total weight loss percentage to date: -4.35%  Interim History: Samantha Hinton is here today for her first follow-up office visit since starting the program with [place].  All blood work/ lab tests that were recently ordered by myself or an outside provider were reviewed with patient today per their request.   Extended time was spent counseling her on all new disease processes that were discovered or preexisting ones that are worsening.  she understands that many of these abnormalities will need to monitored regularly along with the current treatment plan of prudent dietary changes, in which we are making each and every office visit, to improve these health parameters.  We reviewed her new meal plan in detail and questions were answered.  Patient's food recall appears to be accurate and consistent with what is on plan when she is following it.   When eating on plan, her hunger and cravings are well controlled.    Samantha Hinton says that it is, "too much food".  She is not using her snack calories.  Current Meal Plan: the Category 3 Plan for 85-90% of the time.  Current Exercise Plan: None.  Assessment/Plan:   Medications Discontinued During This Encounter  Medication Reason   busPIRone (BUSPAR) 10 MG tablet    Meds ordered this encounter  Medications   Semaglutide,0.25 or 0.5MG /DOS, (OZEMPIC, 0.25 OR 0.5 MG/DOSE,) 2 MG/1.5ML SOPN    Sig: Inject 0.25 mg into the skin once a week.    Dispense:  1.5 mL    Refill:  0    Ov for RF   Vitamin D, Ergocalciferol, (DRISDOL) 1.25 MG (50000 UNIT) CAPS capsule    Sig:  Take 1 capsule (50,000 Units total) by mouth every 7 (seven) days.    Dispense:  4 capsule    Refill:  0    Ov for rf   1. Hyperlipidemia associated with type 2 diabetes mellitus (Samantha Hinton) Course: Not at goal. Lipid-lowering medications: Crestor 10 mg daily.   Plan: Dietary changes: Increase soluble fiber, decrease simple carbohydrates, decrease saturated fat. Exercise changes: Moderate to vigorous-intensity aerobic activity 150 minutes per week or as tolerated. We will continue to monitor along with PCP/specialists as it pertains to her weight loss journey.  Lab Results  Component Value Date   CHOL 330 (H) 05/09/2020   HDL 63.00 05/09/2020   LDLCALC 116 (H) 05/24/2014   LDLDIRECT 242.0 05/09/2020   TRIG 255.0 (H) 05/09/2020   CHOLHDL 5 05/09/2020   Lab Results  Component Value Date   ALT 35 05/09/2020   AST 58 (H) 05/09/2020   ALKPHOS 79 05/09/2020   BILITOT 1.1 05/09/2020   2. Hypertension associated with type 2 diabetes mellitus (Samantha Hinton) At goal. Medications: Ziac 5-6.25 mg twice daily, clonidine 0.1 mg twice daily.   Plan: Avoid buying foods that are: processed, frozen, or prepackaged to avoid excess salt. We will watch for signs of hypotension as she continues lifestyle modifications. We will continue to monitor closely alongside her PCP and/or Specialist.  Regular follow up with PCP and specialists was also encouraged.   BP Readings from Last  3 Encounters:  07/28/20 136/79  07/14/20 140/86  06/08/20 (!) 142/92   Lab Results  Component Value Date   CREATININE 1.01 05/09/2020   3. NAFLD (nonalcoholic fatty liver disease) NAFLD is an umbrella term that encompasses a disease spectrum that includes steatosis (fat) without inflammation, steatohepatitis (NASH; fat + inflammation in a characteristic pattern), and cirrhosis. Bland steatosis is felt to be a benign condition, with extremely low to no risk of progression to cirrhosis, whereas NASH can progress to cirrhosis. The mainstay  of treatment of NAFLD includes lifestyle modification to achieve weight loss, at least 7% of current body weight. Low carbohydrate diets can be beneficial in improving NAFLD liver histology. Additionally, exercise, even the absence of weight loss can have beneficial effects on the patient's metabolic profile and liver health.   4. Chronic obstructive pulmonary disease, unspecified COPD type (Samantha Hinton) She feels she can breathe better, even with just a 10 pound weight loss.  Plan:  Continue prudent nutritional plan and weight loss.  5. Type 2 diabetes mellitus with other specified complication, without long-term current use of insulin (Samantha Hinton) Diabetes Mellitus: Not at goal. Medication: None. Issues reviewed: blood sugar goals, complications of diabetes mellitus, hypoglycemia prevention and treatment, exercise, and nutrition.  A1c 6.9 around 11 months ago.  She had no idea she was in the diabetic range.  She would like medication.  Plan: New.  Discussed labs with patient today.  Start Ozempic 0.25 mg subcutaneously weekly.  Risks and benefits of medication discussed wit her today.  Ozempic will be used in addition to diet and lifestyle changes.  The importance of regular follow up with PCP and all other specialists as scheduled was stressed to patient today. The patient will continue to focus on protein-rich, low simple carbohydrate foods. We reviewed the importance of hydration, regular exercise for stress reduction, and restorative sleep.   Lab Results  Component Value Date   HGBA1C 6.2 (H) 07/14/2020   HGBA1C 6.0 05/09/2020   HGBA1C 6.9 (H) 08/04/2019   Lab Results  Component Value Date   MICROALBUR 5.2 (H) 05/09/2020   LDLCALC 116 (H) 05/24/2014   CREATININE 1.01 05/09/2020   - Start Semaglutide,0.25 or 0.5MG /DOS, (OZEMPIC, 0.25 OR 0.5 MG/DOSE,) 2 MG/1.5ML SOPN; Inject 0.25 mg into the skin once a week.  Dispense: 1.5 mL; Refill: 0  6. Vitamin D deficiency Not at goal. Current vitamin D is  24.6, tested on 07/14/2020. Optimal goal > 50 ng/dL.   Plan: Discussed labs with patient today.  - Discussed importance of vitamin D to their health and well-being.  - possible symptoms of low Vitamin D can be low energy, depressed mood, muscle aches, joint aches, osteoporosis etc. - low Vitamin D levels may be linked to an increased risk of cardiovascular events and even increased risk of cancers- such as colon and breast.  - I recommend pt take weekly prescription vit D - see script below   - Informed patient this may be a lifelong thing, and she was encouraged to continue to take the medicine until told otherwise.   - we will need to monitor levels regularly (every 3-4 mo on average) to keep levels within normal limits.  - weight loss will likely improve availability of vitamin D, thus encouraged Samantha Hinton to continue with meal plan and their weight loss efforts to further improve this condition - pt's questions and concerns regarding this condition addressed.  - Start Vitamin D, Ergocalciferol, (DRISDOL) 1.25 MG (50000 UNIT) CAPS capsule; Take 1 capsule (  50,000 Units total) by mouth every 7 (seven) days.  Dispense: 4 capsule; Refill: 0  7. Obesity, current BMI 37.9  Course: Samantha Hinton is currently in the action stage of change. As such, her goal is to continue with weight loss efforts.   Nutrition goals: She has agreed to the Category 3 Plan.   Exercise goals:  As is.  Behavioral modification strategies: increasing lean protein intake, decreasing simple carbohydrates, meal planning and cooking strategies, keeping healthy foods in the home, and planning for success.  Samantha Hinton has agreed to follow-up with our clinic in 2 weeks. She was informed of the importance of frequent follow-up visits to maximize her success with intensive lifestyle modifications for her multiple health conditions.   Objective:   Blood pressure 136/79, pulse (!) 102, temperature 98.1 F (36.7 C), height 5\' 4"  (1.626 m),  weight 220 lb (99.8 kg), SpO2 94 %. Body mass index is 37.76 kg/m.  General: Cooperative, alert, well developed, in no acute distress. HEENT: Conjunctivae and lids unremarkable. Cardiovascular: Regular rhythm.  Lungs: Normal work of breathing. Neurologic: No focal deficits.   Lab Results  Component Value Date   CREATININE 1.01 05/09/2020   BUN 22 05/09/2020   NA 137 05/09/2020   K 4.5 05/09/2020   CL 93 (L) 05/09/2020   CO2 33 (H) 05/09/2020   Lab Results  Component Value Date   ALT 35 05/09/2020   AST 58 (H) 05/09/2020   ALKPHOS 79 05/09/2020   BILITOT 1.1 05/09/2020   Lab Results  Component Value Date   HGBA1C 6.2 (H) 07/14/2020   HGBA1C 6.0 05/09/2020   HGBA1C 6.9 (H) 08/04/2019   Lab Results  Component Value Date   INSULIN 6.1 07/14/2020   Lab Results  Component Value Date   TSH 3.06 05/09/2020   Lab Results  Component Value Date   CHOL 330 (H) 05/09/2020   HDL 63.00 05/09/2020   LDLCALC 116 (H) 05/24/2014   LDLDIRECT 242.0 05/09/2020   TRIG 255.0 (H) 05/09/2020   CHOLHDL 5 05/09/2020   Lab Results  Component Value Date   WBC 6.2 05/09/2020   HGB 13.6 05/09/2020   HCT 41.2 05/09/2020   MCV 94.1 05/09/2020   PLT 195.0 05/09/2020   Lab Results  Component Value Date   IRON 112 04/10/2019   FERRITIN 160.8 04/10/2019   Obesity Behavioral Intervention:   Approximately 15 minutes were spent on the discussion below.  ASK: We discussed the diagnosis of obesity with Samantha Hinton today and Samantha Hinton agreed to give [place] permission to discuss obesity behavioral modification therapy today.  ASSESS: Samantha Hinton has the diagnosis of obesity and her BMI today is 37.9. Mya is in the action stage of change.   ADVISE: Samantha Hinton was educated on the multiple health risks of obesity as well as the benefit of weight loss to improve her health. She was advised of the need for long term treatment and the importance of lifestyle modifications to improve her current health and  to decrease her risk of future health problems.  AGREE: Multiple dietary modification options and treatment options were discussed and Samantha Hinton agreed to follow the recommendations documented in the above note.  ARRANGE: Brady was educated on the importance of frequent visits to treat obesity as outlined per CMS and USPSTF guidelines and agreed to schedule her next follow up appointment today.  Attestation Statements:   Reviewed by clinician on day of visit: allergies, medications, problem list, medical history, surgical history, family history, social history, and previous encounter notes.  I, Water quality scientist, CMA, am acting as Location manager for [employer], DO.  I have reviewed the above documentation for accuracy and completeness, and I agree with the above. Samantha Hinton, D.O.  The [place] was signed into law in 2016 which includes the topic of electronic health records.  This provides immediate access to information in MyChart.  This includes consultation notes, operative notes, office notes, lab results and pathology reports.  If you have any questions about what you read please let us know at your next visit so we can discuss your concerns and take corrective action if need be.  We are right here with you.

## 2020-08-04 NOTE — Telephone Encounter (Cosign Needed)
3rd attempt

## 2020-08-24 ENCOUNTER — Other Ambulatory Visit: Payer: Self-pay | Admitting: Internal Medicine

## 2020-08-24 ENCOUNTER — Other Ambulatory Visit (INDEPENDENT_AMBULATORY_CARE_PROVIDER_SITE_OTHER): Payer: Self-pay | Admitting: Family Medicine

## 2020-08-24 ENCOUNTER — Other Ambulatory Visit: Payer: Self-pay | Admitting: Family Medicine

## 2020-08-24 DIAGNOSIS — E559 Vitamin D deficiency, unspecified: Secondary | ICD-10-CM

## 2020-08-24 DIAGNOSIS — E1169 Type 2 diabetes mellitus with other specified complication: Secondary | ICD-10-CM

## 2020-08-24 NOTE — Telephone Encounter (Signed)
Last OV with Dr Opalski 

## 2020-08-25 ENCOUNTER — Telehealth: Payer: Self-pay | Admitting: Pharmacist

## 2020-08-25 NOTE — Chronic Care Management (AMB) (Signed)
Chronic Care Management Pharmacy Assistant   Name: SAADIA DEWITT  MRN: [id number] DOB: July 28, 1955  Reason for Encounter: Medication Review-Medication Coordination Call   Recent office visits:  06.09.2022 [name], DO weight management seen for follow-up. Medication prescribed Ergocalciferol 50,000 Units Oral Every 7 days, Semaglutide 0.25 mg Subcutaneous Weekly and discontinued buspirone HCl 10 mg.  Recent consult visits:  None  Hospital visits:  None in previous 6 months  Medications: Outpatient Encounter Medications as of 08/25/2020  Medication Sig Note   albuterol (PROAIR HFA) 108 (90 Base) MCG/ACT inhaler 2 puffs every 4 hours as needed only  if your can't catch your breath    albuterol (PROVENTIL) (2.5 MG/3ML) 0.083% nebulizer solution Take 3 mLs (2.5 mg total) by nebulization every 6 (six) hours as needed for wheezing or shortness of breath.    allopurinol (ZYLOPRIM) 100 MG tablet TAKE ONE TABLET BY MOUTH TWICE DAILY    bisoprolol-hydrochlorothiazide (ZIAC) 5-6.25 MG tablet Take 1 tablet by mouth 2 (two) times daily.    BREZTRI AEROSPHERE 160-9-4.8 MCG/ACT AERO INHALE TWO PUFFS BY MOUTH INTO LUNGS TWICE DAILY    cloNIDine (CATAPRES) 0.1 MG tablet Take 1 tablet (0.1 mg total) by mouth 2 (two) times daily.    Colchicine 0.6 MG CAPS Take 0.6 mg by mouth 2 (two) times daily as needed. 07/14/2020: Using prn   gabapentin (NEURONTIN) 100 MG capsule TAKE ONE CAPSULE BY MOUTH TWICE DAILY    gabapentin (NEURONTIN) 300 MG capsule 1 pill nightly    loratadine (CLARITIN) 10 MG tablet Take 10 mg by mouth daily.    omeprazole (PRILOSEC) 40 MG capsule TAKE 1 CAPSULE BY MOUTH 30 TO 60 MINUTES BEFORE YOUR FIRST AND LAST MEALS OF THE DAY    predniSONE (DELTASONE) 10 MG tablet Take  4 each am x 2 days,   2 each am x 2 days,  1 each am x 2 days and stop    rosuvastatin (CRESTOR) 10 MG tablet Take 1 tablet (10 mg total) by mouth daily. For high cholesterol,avoid taking with cochicine     Semaglutide,0.25 or 0.5MG /DOS, (OZEMPIC, 0.25 OR 0.5 MG/DOSE,) 2 MG/1.5ML SOPN Inject 0.25 mg into the skin once a week.    venlafaxine XR (EFFEXOR XR) 75 MG 24 hr capsule Take 1 capsule (75 mg total) by mouth daily with breakfast.    Vitamin D, Ergocalciferol, (DRISDOL) 1.25 MG (50000 UNIT) CAPS capsule Take 1 capsule (50,000 Units total) by mouth every 7 (seven) days.    No facility-administered encounter medications on file as of 08/25/2020.   Reviewed chart for medication changes ahead of medication coordination call.  BP Readings from Last 3 Encounters:  07/28/20 136/79  07/14/20 140/86  06/08/20 (!) 142/92    Lab Results  Component Value Date   HGBA1C 6.2 (H) 07/14/2020     Patient obtains medications through Adherence Packaging  30 Days  Last adherence delivery included:  Gabapentin 100 mg - take 1 tablet in the am and 1 tablet with dinner. Gabapentin 300 mg - take 1 tablet at bedtime. Prednisone 10 mg - take as needed for flare ups with breathing. Albuterol PROAIR inhaler - take 2 puffs every 4 hours as needed. Loratadine 10 mg - take daily in the morning  Venlafaxine 75 mg - take 1 tablet in the morning Omeprazole 40 mg - take 1 capsule twice daily in the morning and with dinner Rosuvastatin 10 mg - take 1 tablet with dinner Allopurinol 100 mg - take 1 tablet  twice daily in the morning and with dinner bisoprolol- HCTZ 5-6.25 MG - take 1 tablet twice daily in the morning and with dinner Breztri inhaler - 2 puffs twice daily Clonidine 0.1mg  - take 1 tablet twice daily in the morning and with dinner  Patient declined the following last month due to PRN use/additional supply on hand. Albuterol (PROAIR HFA) 108 (90 Base) MCG/ACT inhaler  Patient is due for next adherence delivery on: 07.14.2022. Called patient and reviewed medications and coordinated delivery. This delivery to include: Gabapentin 100 mg - take 1 tablet in the am and 1 tablet with dinner. Gabapentin 300 mg -  take 1 tablet at bedtime. Prednisone 10 mg - take as needed for flare ups with breathing. Albuterol PROAIR inhaler - take 2 puffs every 4 hours as needed. Loratadine 10 mg - take daily in the morning  Venlafaxine 75 mg - take 1 tablet in the morning Omeprazole 40 mg - take 1 capsule twice daily in the morning and with dinner Rosuvastatin 10 mg - take 1 tablet with dinner Allopurinol 100 mg - take 1 tablet twice daily in the morning and with dinner Bisoprolol- HCTZ 5-6.25 MG - take 1 tablet twice daily in the morning and with dinner Breztri inhaler - 2 puffs twice daily Clonidine 0.1mg  - take 1 tablet twice daily in the morning and with dinner   Patient declined the following medications due to PRN use. Clonidine 0.1mg  - take 1 tablet twice daily in the morning and with dinner Albuterol (PROAIR HFA) 108 (90 Base) MCG/ACT inhaler  She currently does not need refills Confirmed delivery date of 07.14.2022, advised patient that pharmacy will contact them the morning of delivery.   Care Gaps: Foot Exam Eye Exam Zoster Vaccines PAP Smear Mammogram Covid-19 4th Booster  Star Rating Drugs: Medication Dispensed Quantity Pharmacy  Rosuvastatin 10 mg 06.16.2022 [facility]   Amilia Revonda Standard, [place] Pharmacist Assistant [phone]

## 2020-08-26 ENCOUNTER — Other Ambulatory Visit (INDEPENDENT_AMBULATORY_CARE_PROVIDER_SITE_OTHER): Payer: Self-pay | Admitting: Family Medicine

## 2020-08-26 DIAGNOSIS — E1169 Type 2 diabetes mellitus with other specified complication: Secondary | ICD-10-CM

## 2020-08-26 DIAGNOSIS — E559 Vitamin D deficiency, unspecified: Secondary | ICD-10-CM

## 2020-08-29 ENCOUNTER — Encounter (INDEPENDENT_AMBULATORY_CARE_PROVIDER_SITE_OTHER): Payer: Self-pay | Admitting: Family Medicine

## 2020-08-29 ENCOUNTER — Ambulatory Visit (INDEPENDENT_AMBULATORY_CARE_PROVIDER_SITE_OTHER): Payer: Medicare Other | Admitting: Family Medicine

## 2020-08-29 ENCOUNTER — Ambulatory Visit: Payer: Medicare Other

## 2020-08-29 ENCOUNTER — Other Ambulatory Visit: Payer: Self-pay

## 2020-08-29 VITALS — BP 131/84 | HR 92 | Temp 97.7°F | Ht 64.0 in | Wt 213.0 lb

## 2020-08-29 DIAGNOSIS — E1169 Type 2 diabetes mellitus with other specified complication: Secondary | ICD-10-CM

## 2020-08-29 DIAGNOSIS — Z6839 Body mass index (BMI) 39.0-39.9, adult: Secondary | ICD-10-CM

## 2020-08-29 DIAGNOSIS — E559 Vitamin D deficiency, unspecified: Secondary | ICD-10-CM | POA: Diagnosis not present

## 2020-08-29 NOTE — Telephone Encounter (Signed)
Last OV with Dr Opalski 

## 2020-08-30 ENCOUNTER — Ambulatory Visit (INDEPENDENT_AMBULATORY_CARE_PROVIDER_SITE_OTHER): Payer: Medicare Other | Admitting: Internal Medicine

## 2020-08-30 ENCOUNTER — Encounter: Payer: Self-pay | Admitting: Internal Medicine

## 2020-08-30 VITALS — BP 146/86 | HR 96 | Temp 98.0°F | Ht 64.0 in | Wt 218.0 lb

## 2020-08-30 DIAGNOSIS — T887XXA Unspecified adverse effect of drug or medicament, initial encounter: Secondary | ICD-10-CM | POA: Diagnosis not present

## 2020-08-30 DIAGNOSIS — I10 Essential (primary) hypertension: Secondary | ICD-10-CM | POA: Diagnosis not present

## 2020-08-30 DIAGNOSIS — E785 Hyperlipidemia, unspecified: Secondary | ICD-10-CM

## 2020-08-30 DIAGNOSIS — I152 Hypertension secondary to endocrine disorders: Secondary | ICD-10-CM

## 2020-08-30 DIAGNOSIS — E1159 Type 2 diabetes mellitus with other circulatory complications: Secondary | ICD-10-CM | POA: Diagnosis not present

## 2020-08-30 DIAGNOSIS — K760 Fatty (change of) liver, not elsewhere classified: Secondary | ICD-10-CM | POA: Diagnosis not present

## 2020-08-30 DIAGNOSIS — E790 Hyperuricemia without signs of inflammatory arthritis and tophaceous disease: Secondary | ICD-10-CM

## 2020-08-30 DIAGNOSIS — E1169 Type 2 diabetes mellitus with other specified complication: Secondary | ICD-10-CM | POA: Diagnosis not present

## 2020-08-30 MED ORDER — ONDANSETRON 4 MG PO TBDP: 4.0000 mg | ORAL_TABLET | Freq: Three times a day (TID) | ORAL | 0 refills | Status: DC | PRN

## 2020-08-30 NOTE — Patient Instructions (Signed)
Lipids fu today   Continue weight loss .  Sent in zofran if needed.   Plan fu depending   4-6 months?   Bp goal is 130/80 or thereabouts.

## 2020-08-30 NOTE — Progress Notes (Signed)
Chief Complaint  Patient presents with   Follow-up    HPI: GRACEANNE GUIN 65 y.o. come in for Chronic disease management   She has lost 17 pounds since been in weight management and has recently been put on Ozempic which seems to be helpful but is causing great nausea today and asks for an antinausea medicine for today. Blood pressure is some better 136 138 / 70s  range although it is up today because of the way she feels Breathing about the same Dr. Melvyn Novas is given her as needed prednisone burst has had to use 2 times in the last 6 months No gout flares is taking allopurinol but has not as needed medicines. HLD taking medication. Last A1c was pretty good 6.3. She is hoping that back pain will be more manageable intervention with his current weight loss. Skin is really dry lower extremity no lesions at this time.  Scaly white patches ROS: See pertinent positives and negatives per HPI.  Past Medical History:  Diagnosis Date   Anxiety    Asthma    Chronic back pain    buldging disc and tumor.Spondylolisthesis   COPD (chronic obstructive pulmonary disease) (HCC)    Symbicort daily and ProAir as needed   Depression    Emphysema    Emphysema of lung ([place])    Fatty liver 04/13/2019   Fatty liver    Fibromyalgia    Gallbladder problem    Genital warts    GERD (gastroesophageal reflux disease)    takes Omeprazole daily   Heartburn    History of bronchitis 01/2014   History of colon polyps    Hyperlipidemia    has been off of meds d/t getting sick from them.Not been addressed again.    Hypertension    takes Ziac daily   Nocturia    Pneumonia    hx of-8+yrs ago   PONV (postoperative nausea and vomiting)    Shortness of breath dyspnea    thinks d/t pain meds and notices with exertion   SOBOE (shortness of breath on exertion)    Stroke ([place])    16 yrs ago--left eye    Swallowing difficulty     Family History  Problem Relation Age of Onset   CAD Father        tumor  between heart and lung    Hypertension Father    Cancer Father    CAD Sister        ? dx    Hypertension Mother    Lung cancer Mother        died 37    Diabetes Maternal Grandmother        late 2s    Colon cancer Neg Hx    Colon polyps Neg Hx    Esophageal cancer Neg Hx    Rectal cancer Neg Hx    Stomach cancer Neg Hx     Social History   Socioeconomic History   Marital status: Significant Other    Spouse name: [name]   Number of children: 2   Years of education: Not on file   Highest education level: Not on file  Occupational History   Occupation: retired    Fish farm manager: Scientist, research (physical sciences)  Tobacco Use   Smoking status: Former    Packs/day: 0.25    Years: 44.00    Pack years: 11.00    Types: Cigarettes    Quit date: 02/04/2014    Years since quitting: 6.5   Smokeless tobacco: Never  Tobacco comments:    quit smoking in Dec 2015  Vaping Use   Vaping Use: Former  Substance and Sexual Activity   Alcohol use: Yes    Alcohol/week: 4.0 standard drinks    Types: 4 Standard drinks or equivalent per week    Comment: couple times a week   Drug use: No   Sexual activity: Not Currently    Birth control/protection: Surgical    Comment: 1st intercourse 65 yo-Fewer than 5 partners  Other Topics Concern   Not on file  Social History Narrative   6-8 hours of sleep per night   Lives with her fiance hh of 2 no pets    1 dog in the home   Retired no ets  But tob 8 per day   etoh ocass 1-2    On disability from her neck surgery predicaments .   orig from [employer] in [place] area.    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 2   Last Pap 2006 last mammogram 2000 and   Has dentures   FA    Social Determinants of Health   Financial Resource Strain: Medium Risk   Difficulty of Paying Living Expenses: Somewhat hard  Food Insecurity: Not on file  Transportation Needs: No Transportation Needs   Lack of Transportation (Medical): No   Lack of  Transportation (Non-Medical): No  Physical Activity: Not on file  Stress: Not on file  Social Connections: Not on file    Outpatient Medications Prior to Visit  Medication Sig Dispense Refill   albuterol (PROAIR HFA) 108 (90 Base) MCG/ACT inhaler 2 puffs every 4 hours as needed only  if your can't catch your breath 18 g 11   albuterol (PROVENTIL) (2.5 MG/3ML) 0.083% nebulizer solution Take 3 mLs (2.5 mg total) by nebulization every 6 (six) hours as needed for wheezing or shortness of breath. 75 mL 12   allopurinol (ZYLOPRIM) 100 MG tablet TAKE ONE TABLET BY MOUTH TWICE DAILY 60 tablet 3   bisoprolol-hydrochlorothiazide (ZIAC) 5-6.25 MG tablet Take 1 tablet by mouth 2 (two) times daily. 180 tablet 1   BREZTRI AEROSPHERE 160-9-4.8 MCG/ACT AERO INHALE TWO PUFFS BY MOUTH INTO LUNGS TWICE DAILY 10.7 g 11   cloNIDine (CATAPRES) 0.1 MG tablet Take 1 tablet (0.1 mg total) by mouth 2 (two) times daily. 60 tablet 11   Colchicine 0.6 MG CAPS Take 0.6 mg by mouth 2 (two) times daily as needed. 60 capsule 1   gabapentin (NEURONTIN) 100 MG capsule TAKE ONE CAPSULE BY MOUTH TWICE DAILY 60 capsule 0   gabapentin (NEURONTIN) 300 MG capsule 1 pill nightly 90 capsule 3   loratadine (CLARITIN) 10 MG tablet Take 10 mg by mouth daily.     omeprazole (PRILOSEC) 40 MG capsule TAKE 1 CAPSULE BY MOUTH 30 TO 60 MINUTES BEFORE YOUR FIRST AND LAST MEALS OF THE DAY 60 capsule 5   predniSONE (DELTASONE) 10 MG tablet Take  4 each am x 2 days,   2 each am x 2 days,  1 each am x 2 days and stop 14 tablet 11   rosuvastatin (CRESTOR) 10 MG tablet Take 1 tablet (10 mg total) by mouth daily. For high cholesterol,avoid taking with cochicine 90 tablet 1   Semaglutide,0.25 or 0.5MG /DOS, (OZEMPIC, 0.25 OR 0.5 MG/DOSE,) 2 MG/1.5ML SOPN Inject 0.25 mg into the skin once a week. 1.5 mL 0   venlafaxine XR (EFFEXOR XR) 75 MG 24 hr capsule Take 1 capsule (75  mg total) by mouth daily with breakfast. 90 capsule 3   Vitamin D, Ergocalciferol,  (DRISDOL) 1.25 MG (50000 UNIT) CAPS capsule Take 1 capsule (50,000 Units total) by mouth every 7 (seven) days. 4 capsule 0   No facility-administered medications prior to visit.     EXAM:  BP (!) 146/86 (BP Location: Left Arm, Patient Position: Sitting, Cuff Size: Normal)   Pulse 96   Temp 98 F (36.7 C) (Oral)   Ht 5\' 4"  (1.626 m)   Wt 218 lb (98.9 kg)   SpO2 93%   BMI 37.42 kg/m   Body mass index is 37.42 kg/m. Wt Readings from Last 3 Encounters:  08/30/20 218 lb (98.9 kg)  08/29/20 213 lb (96.6 kg)  07/28/20 220 lb (99.8 kg)    GENERAL: vitals reviewed and listed above, alert, oriented, appears well hydrated and in no acute distress HEENT: atraumatic, conjunctiva  clear, no obvious abnormalities on inspection of external nose and ears OP : Masked NECK: no obvious masses on inspection palpation  LUNGS: clear to auscultation bilaterally, no wheezes, rales or rhonchi, CV: HRRR, no clubbing cyanosis or  peripheral edema nl cap refill  MS: moves all extremities without noticeable focal  abnormality skin lower extremity dry white scaly areas fading bruise no ulcer noted PSYCH: pleasant and cooperative, no obvious depression or anxiety Lab Results  Component Value Date   WBC 6.2 05/09/2020   HGB 13.6 05/09/2020   HCT 41.2 05/09/2020   PLT 195.0 05/09/2020   GLUCOSE 114 (H) 05/09/2020   CHOL 330 (H) 05/09/2020   TRIG 255.0 (H) 05/09/2020   HDL 63.00 05/09/2020   LDLDIRECT 242.0 05/09/2020   LDLCALC 116 (H) 05/24/2014   ALT 35 05/09/2020   AST 58 (H) 05/09/2020   NA 137 05/09/2020   K 4.5 05/09/2020   CL 93 (L) 05/09/2020   CREATININE 1.01 05/09/2020   BUN 22 05/09/2020   CO2 33 (H) 05/09/2020   TSH 3.06 05/09/2020   HGBA1C 6.2 (H) 07/14/2020   MICROALBUR 5.2 (H) 05/09/2020   BP Readings from Last 3 Encounters:  08/30/20 (!) 146/86  08/29/20 131/84  07/28/20 136/79    ASSESSMENT AND PLAN:  Discussed the following assessment and plan:  Hyperlipidemia  associated with type 2 diabetes mellitus ([place]) - Plan: Lipid panel, Hepatic function panel  NAFLD (nonalcoholic fatty liver disease) - Plan: Lipid panel, Hepatic function panel  Essential hypertension  Hyperuricemia - On allopurinol follow-up uric acid today - Plan: Uric acid  Medication side effect - Extreme nausea today.  Hypertension associated with type 2 diabetes mellitus ([place]) Record review continue with healthy weight loss discussed management of nausea and how she eats We will send in some Zofran today for short-term Check lipids LFTs and uric acid today is due  Plan follow-up depending. Not changing blood pressure medicine at this time because she reports reasonable readings at home we will follow and continue weight loss. -Patient advised to return or notify health care team  if  new concerns arise.  Patient Instructions  Lipids fu today   Continue weight loss .  Sent in zofran if needed.   Plan fu depending   4-6 months?   Bp goal is 130/80 or thereabouts.    Standley Brooking. Tomasa Dobransky M.D.

## 2020-09-06 ENCOUNTER — Telehealth: Payer: Self-pay | Admitting: Internal Medicine

## 2020-09-06 NOTE — Telephone Encounter (Signed)
Left message for patient to call back and schedule Medicare Annual Wellness Visit (AWV) either virtually or in office.   Last AWV 07/06/15  please schedule at anytime with [facility] Nurse Health Advisor 1 or 2   This should be a 45 minute visit.

## 2020-09-06 NOTE — Telephone Encounter (Signed)
Patient is scheduled 09/28/2020

## 2020-09-08 NOTE — Progress Notes (Signed)
Chief Complaint:   OBESITY Samantha Hinton is here to discuss her progress with her obesity treatment plan along with follow-up of her obesity related diagnoses.   Today's visit was #: 3 Starting weight: 230 lbs Starting date: 07/14/2020 Today's weight: 213 lbs Today's date: 08/29/2020 Weight change since last visit: 7 lbs Total lbs lost to date: 17 lbs Body mass index is 36.56 kg/m.  Total weight loss percentage to date: -7.39%  Interim History:  Samantha Hinton is doing better with getting all meats and vegetables in.  Tolerating Ozempic and vitamin D well.  Current Meal Plan: the Category 3 Plan for 85-95% of the time.  Current Exercise Plan: None. Current Anti-Obesity Medications: Ozempic 0.25 mg subcutaneously weekly. Side effects: None.  Assessment/Plan:   1. Type 2 diabetes mellitus with other specified complication, without long-term current use of insulin (HCC) Diabetes Mellitus: Not at goal. Medication: Ozempic 0.25 mg subcutaneously weekly. Issues reviewed: blood sugar goals, complications of diabetes mellitus, hypoglycemia prevention and treatment, exercise, and nutrition. She took her first dose of Ozempic without issues.  Tolerating well.  Second dose today.  She is not checking her blood sugar.  Plan:  After second dose today, increase Ozempic to 0.5 mg weekly on 3rd and 4th doses. The importance of regular follow up with PCP and all other specialists as scheduled was stressed to patient today. The patient will continue to focus on protein-rich, low simple carbohydrate foods. We reviewed the importance of hydration, regular exercise for stress reduction, and restorative sleep.   Lab Results  Component Value Date   HGBA1C 6.2 (H) 07/14/2020   HGBA1C 6.0 05/09/2020   HGBA1C 6.9 (H) 08/04/2019   Lab Results  Component Value Date   MICROALBUR 5.2 (H) 05/09/2020   LDLCALC 116 (H) 05/24/2014   CREATININE 1.01 05/09/2020   2. Vitamin D deficiency Not at goal.  Tolerating well  without issues.  Plan: Continue to take prescription Vitamin D @50 ,000 IU every week as prescribed.  Follow-up for routine testing of Vitamin D, at least 2-3 times per year to avoid over-replacement.  Lab Results  Component Value Date   VD25OH 24.6 (L) 07/14/2020   3. Class 2 severe obesity with serious comorbidity and body mass index (BMI) of 39.0 to 39.9 in adult, unspecified obesity type ([place])  Course: Samantha Hinton is currently in the action stage of change. As such, her goal is to continue with weight loss efforts.   Nutrition goals: She has agreed to the Category 3 Plan with breakfast options.   Exercise goals:  5-10 minutes per day.  Behavioral modification strategies: increasing lean protein intake, decreasing simple carbohydrates, and planning for success.  Samantha Hinton has agreed to follow-up with our clinic in 2-3 weeks. She was informed of the importance of frequent follow-up visits to maximize her success with intensive lifestyle modifications for her multiple health conditions.   Objective:   Blood pressure 131/84, pulse 92, temperature 97.7 F (36.5 C), height 5\' 4"  (1.626 m), weight 213 lb (96.6 kg), SpO2 94 %. Body mass index is 36.56 kg/m.  General: Cooperative, alert, well developed, in no acute distress. HEENT: Conjunctivae and lids unremarkable. Cardiovascular: Regular rhythm.  Lungs: Normal work of breathing. Neurologic: No focal deficits.   Lab Results  Component Value Date   CREATININE 1.01 05/09/2020   BUN 22 05/09/2020   NA 137 05/09/2020   K 4.5 05/09/2020   CL 93 (L) 05/09/2020   CO2 33 (H) 05/09/2020   Lab Results  Component  Value Date   ALT 35 05/09/2020   AST 58 (H) 05/09/2020   ALKPHOS 79 05/09/2020   BILITOT 1.1 05/09/2020   Lab Results  Component Value Date   HGBA1C 6.2 (H) 07/14/2020   HGBA1C 6.0 05/09/2020   HGBA1C 6.9 (H) 08/04/2019   Lab Results  Component Value Date   INSULIN 6.1 07/14/2020   Lab Results  Component Value Date    TSH 3.06 05/09/2020   Lab Results  Component Value Date   CHOL 330 (H) 05/09/2020   HDL 63.00 05/09/2020   LDLCALC 116 (H) 05/24/2014   LDLDIRECT 242.0 05/09/2020   TRIG 255.0 (H) 05/09/2020   CHOLHDL 5 05/09/2020   Lab Results  Component Value Date   VD25OH 24.6 (L) 07/14/2020   Lab Results  Component Value Date   WBC 6.2 05/09/2020   HGB 13.6 05/09/2020   HCT 41.2 05/09/2020   MCV 94.1 05/09/2020   PLT 195.0 05/09/2020   Lab Results  Component Value Date   IRON 112 04/10/2019   FERRITIN 160.8 04/10/2019   Obesity Behavioral Intervention:   Approximately 15 minutes were spent on the discussion below.  ASK: We discussed the diagnosis of obesity with Samantha Hinton today and Samantha Hinton agreed to give [place] permission to discuss obesity behavioral modification therapy today.  ASSESS: Samantha Hinton has the diagnosis of obesity and her BMI today is 36.7. Samantha Hinton is in the action stage of change.   ADVISE: Samantha Hinton was educated on the multiple health risks of obesity as well as the benefit of weight loss to improve her health. She was advised of the need for long term treatment and the importance of lifestyle modifications to improve her current health and to decrease her risk of future health problems.  AGREE: Multiple dietary modification options and treatment options were discussed and Samantha Hinton agreed to follow the recommendations documented in the above note.  ARRANGE: Samantha Hinton was educated on the importance of frequent visits to treat obesity as outlined per CMS and USPSTF guidelines and agreed to schedule her next follow up appointment today.  Attestation Statements:   Reviewed by clinician on day of visit: allergies, medications, problem list, medical history, surgical history, family history, social history, and previous encounter notes.  I, Water quality scientist, CMA, am acting as Location manager for [employer], DO.  I have reviewed the above documentation for accuracy and  completeness, and I agree with the above. Marjory Sneddon, D.O.  The [place] was signed into law in 2016 which includes the topic of electronic health records.  This provides immediate access to information in MyChart.  This includes consultation notes, operative notes, office notes, lab results and pathology reports.  If you have any questions about what you read please let us know at your next visit so we can discuss your concerns and take corrective action if need be.  We are right here with you.

## 2020-09-12 ENCOUNTER — Encounter (INDEPENDENT_AMBULATORY_CARE_PROVIDER_SITE_OTHER): Payer: Self-pay | Admitting: Family Medicine

## 2020-09-12 ENCOUNTER — Ambulatory Visit (INDEPENDENT_AMBULATORY_CARE_PROVIDER_SITE_OTHER): Payer: Medicare Other | Admitting: Family Medicine

## 2020-09-12 ENCOUNTER — Other Ambulatory Visit: Payer: Self-pay

## 2020-09-12 VITALS — BP 128/82 | HR 86 | Temp 98.0°F | Ht 64.0 in | Wt 215.0 lb

## 2020-09-12 DIAGNOSIS — Z6839 Body mass index (BMI) 39.0-39.9, adult: Secondary | ICD-10-CM | POA: Diagnosis not present

## 2020-09-12 DIAGNOSIS — E1169 Type 2 diabetes mellitus with other specified complication: Secondary | ICD-10-CM | POA: Diagnosis not present

## 2020-09-12 DIAGNOSIS — K5909 Other constipation: Secondary | ICD-10-CM | POA: Diagnosis not present

## 2020-09-12 DIAGNOSIS — E559 Vitamin D deficiency, unspecified: Secondary | ICD-10-CM | POA: Diagnosis not present

## 2020-09-12 MED ORDER — POLYETHYLENE GLYCOL 3350 17 G PO PACK: 17.0000 g | PACK | Freq: Two times a day (BID) | ORAL | 0 refills | Status: DC

## 2020-09-12 MED ORDER — OZEMPIC (0.25 OR 0.5 MG/DOSE) 2 MG/1.5ML ~~LOC~~ SOPN: 0.5000 mg | PEN_INJECTOR | SUBCUTANEOUS | 0 refills | Status: DC

## 2020-09-12 MED ORDER — VITAMIN D (ERGOCALCIFEROL) 1.25 MG (50000 UNIT) PO CAPS: 50000.0000 [IU] | ORAL_CAPSULE | ORAL | 0 refills | Status: DC

## 2020-09-13 MED ORDER — VITAMIN D (ERGOCALCIFEROL) 1.25 MG (50000 UNIT) PO CAPS: 50000.0000 [IU] | ORAL_CAPSULE | ORAL | 0 refills | Status: DC

## 2020-09-13 MED ORDER — OZEMPIC (0.25 OR 0.5 MG/DOSE) 2 MG/1.5ML ~~LOC~~ SOPN: 0.2500 mg | PEN_INJECTOR | SUBCUTANEOUS | 0 refills | Status: DC

## 2020-09-15 ENCOUNTER — Other Ambulatory Visit: Payer: Self-pay | Admitting: Internal Medicine

## 2020-09-18 NOTE — Progress Notes (Signed)
Chief Complaint:   OBESITY Samantha Hinton is here to discuss her progress with her obesity treatment plan along with follow-up of her obesity related diagnoses. Samantha Hinton is on the Category 3 Plan with breakfast options and states she is following her eating plan approximately 75% of the time. Rhen states she is not currently exercising.  Today's visit was #: 4 Starting weight: 230 lbs Starting date: 07/14/2020 Today's weight: 215 lbs Today's date: 09/12/2020 Total lbs lost to date: 15 Total lbs lost since last in-office visit: 3  Interim History: Samantha Hinton has increased movement and been more intentional with activity. She is drinking 6-7 bottles of water per day.  Subjective:   1. Type 2 diabetes mellitus with other specified complication, without long-term current use of insulin (HCC) Samantha Hinton is taking Ozempic 0.5 mg.  Lab Results  Component Value Date   HGBA1C 6.2 (H) 07/14/2020   HGBA1C 6.0 05/09/2020   HGBA1C 6.9 (H) 08/04/2019   Lab Results  Component Value Date   MICROALBUR 5.2 (H) 05/09/2020   LDLCALC 116 (H) 05/24/2014   CREATININE 1.01 05/09/2020   Lab Results  Component Value Date   INSULIN 6.1 07/14/2020   2. Other constipation Pt reports constipation is worse since starting Ozempic. She is still going everyday, but with smaller caliber.  3. Vitamin D deficiency She is currently taking prescription vitamin D 50,000 IU each week. She denies nausea, vomiting or muscle weakness.  Lab Results  Component Value Date   VD25OH 24.6 (L) 07/14/2020   Assessment/Plan:  No orders of the defined types were placed in this encounter.   Medications Discontinued During This Encounter  Medication Reason   Semaglutide,0.25 or 0.'5MG'$ /DOS, (OZEMPIC, 0.25 OR 0.5 MG/DOSE,) 2 MG/1.5ML SOPN Reorder   Vitamin D, Ergocalciferol, (DRISDOL) 1.25 MG (50000 UNIT) CAPS capsule Reorder     Meds ordered this encounter  Medications   Semaglutide,0.25 or 0.'5MG'$ /DOS, (OZEMPIC, 0.25 OR  0.5 MG/DOSE,) 2 MG/1.5ML SOPN    Sig: Inject 0.5 mg into the skin once a week.    Dispense:  1.5 mL    Refill:  0    Ov for RF   Vitamin D, Ergocalciferol, (DRISDOL) 1.25 MG (50000 UNIT) CAPS capsule    Sig: Take 1 capsule (50,000 Units total) by mouth every 7 (seven) days.    Dispense:  4 capsule    Refill:  0    Ov for rf   polyethylene glycol (MIRALAX / GLYCOLAX) 17 g packet    Sig: Take 17 g by mouth 2 (two) times daily. Until stooling regularly    Dispense:  60 packet    Refill:  0     1. Type 2 diabetes mellitus with other specified complication, without long-term current use of insulin (HCC) Good blood sugar control is important to decrease the likelihood of diabetic complications such as nephropathy, neuropathy, limb loss, blindness, coronary artery disease, and death. Intensive lifestyle modification including diet, exercise and weight loss are the first line of treatment for diabetes.   Refill- Semaglutide,0.25 or 0.'5MG'$ /DOS, (OZEMPIC, 0.25 OR 0.5 MG/DOSE,) 2 MG/1.5ML SOPN; Inject 0.5 mg into the skin once a week.  Dispense: 1.5 mL; Refill: 0  2. Other constipation Samantha Hinton will start Miralax, as prescribed below. She was informed that a decrease in bowel movement frequency is normal while losing weight, but stools should not be hard or painful. Orders and follow up as documented in patient record.   Counseling Getting to Good Bowel Health: Your goal is to  have one soft bowel movement each day. Drink at least 8 glasses of water each day. Eat plenty of fiber (goal is over 25 grams each day). It is best to get most of your fiber from dietary sources which includes leafy green vegetables, fresh fruit, and whole grains. You may need to add fiber with the help of OTC fiber supplements. These include Metamucil, Citrucel, and Flaxseed. If you are still having trouble, try adding Miralax or Magnesium Citrate. If all of these changes do not work, Cabin crew.  Start- polyethylene  glycol (MIRALAX / GLYCOLAX) 17 g packet; Take 17 g by mouth 2 (two) times daily. Until stooling regularly  Dispense: 60 packet; Refill: 0  3. Vitamin D deficiency Low Vitamin D level contributes to fatigue and are associated with obesity, breast, and colon cancer. She agrees to continue to take prescription Vitamin D '@50'$ ,000 IU every week and will follow-up for routine testing of Vitamin D, at least 2-3 times per year to avoid over-replacement.  Refill- Vitamin D, Ergocalciferol, (DRISDOL) 1.25 MG (50000 UNIT) CAPS capsule; Take 1 capsule (50,000 Units total) by mouth every 7 (seven) days.  Dispense: 4 capsule; Refill: 0  4. Obesity with current BMI of 36.9  Samantha Hinton is currently in the action stage of change. As such, her goal is to continue with weight loss efforts. She has agreed to the Category 3 Plan with breakfast options.   Exercise goals: All adults should avoid inactivity. Some physical activity is better than none, and adults who participate in any amount of physical activity gain some health benefits. Increase activity as tolerated.  Behavioral modification strategies: increasing lean protein intake, decreasing simple carbohydrates, and avoiding temptations.  Samantha Hinton has agreed to follow-up with our clinic in 2 weeks. She was informed of the importance of frequent follow-up visits to maximize her success with intensive lifestyle modifications for her multiple health conditions.   Objective:   Blood pressure 128/82, pulse 86, temperature 98 F (36.7 C), height '5\' 4"'$  (1.626 m), weight 215 lb (97.5 kg), SpO2 95 %. Body mass index is 36.9 kg/m.  General: Cooperative, alert, well developed, in no acute distress. HEENT: Conjunctivae and lids unremarkable. Cardiovascular: Regular rhythm.  Lungs: Normal work of breathing. Neurologic: No focal deficits.   Lab Results  Component Value Date   CREATININE 1.01 05/09/2020   BUN 22 05/09/2020   NA 137 05/09/2020   K 4.5 05/09/2020   CL  93 (L) 05/09/2020   CO2 33 (H) 05/09/2020   Lab Results  Component Value Date   ALT 35 05/09/2020   AST 58 (H) 05/09/2020   ALKPHOS 79 05/09/2020   BILITOT 1.1 05/09/2020   Lab Results  Component Value Date   HGBA1C 6.2 (H) 07/14/2020   HGBA1C 6.0 05/09/2020   HGBA1C 6.9 (H) 08/04/2019   Lab Results  Component Value Date   INSULIN 6.1 07/14/2020   Lab Results  Component Value Date   TSH 3.06 05/09/2020   Lab Results  Component Value Date   CHOL 330 (H) 05/09/2020   HDL 63.00 05/09/2020   LDLCALC 116 (H) 05/24/2014   LDLDIRECT 242.0 05/09/2020   TRIG 255.0 (H) 05/09/2020   CHOLHDL 5 05/09/2020   Lab Results  Component Value Date   VD25OH 24.6 (L) 07/14/2020   Lab Results  Component Value Date   WBC 6.2 05/09/2020   HGB 13.6 05/09/2020   HCT 41.2 05/09/2020   MCV 94.1 05/09/2020   PLT 195.0 05/09/2020   Lab Results  Component Value Date   IRON 112 04/10/2019   FERRITIN 160.8 04/10/2019    Obesity Behavioral Intervention:   Approximately 15 minutes were spent on the discussion below.  ASK: We discussed the diagnosis of obesity with Samantha Hinton today and Samantha Hinton agreed to give [place] permission to discuss obesity behavioral modification therapy today.  ASSESS: Samantha Hinton has the diagnosis of obesity and her BMI today is 36.9. Samantha Hinton is in the action stage of change.   ADVISE: Samantha Hinton was educated on the multiple health risks of obesity as well as the benefit of weight loss to improve her health. She was advised of the need for long term treatment and the importance of lifestyle modifications to improve her current health and to decrease her risk of future health problems.  AGREE: Multiple dietary modification options and treatment options were discussed and Samantha Hinton agreed to follow the recommendations documented in the above note.  ARRANGE: Samantha Hinton was educated on the importance of frequent visits to treat obesity as outlined per CMS and USPSTF guidelines and  agreed to schedule her next follow up appointment today.  Attestation Statements:   Reviewed by clinician on day of visit: allergies, medications, problem list, medical history, surgical history, family history, social history, and previous encounter notes.  Coral Ceo, CMA, am acting as transcriptionist for [employer], DO.  I have reviewed the above documentation for accuracy and completeness, and I agree with the above. Marjory Sneddon, D.O.  The [place] was signed into law in 2016 which includes the topic of electronic health records.  This provides immediate access to information in MyChart.  This includes consultation notes, operative notes, office notes, lab results and pathology reports.  If you have any questions about what you read please let us know at your next visit so we can discuss your concerns and take corrective action if need be.  We are right here with you.

## 2020-09-26 ENCOUNTER — Ambulatory Visit (INDEPENDENT_AMBULATORY_CARE_PROVIDER_SITE_OTHER): Payer: Medicare Other | Admitting: Family Medicine

## 2020-09-27 ENCOUNTER — Telehealth: Payer: Self-pay | Admitting: Internal Medicine

## 2020-09-27 NOTE — Telephone Encounter (Signed)
Left message asking pt to call I have office number and my jabber number [phone]  Please r/s 09/28/20 AWV appointment laura will be out of office

## 2020-09-28 ENCOUNTER — Ambulatory Visit: Payer: Medicare Other

## 2020-09-28 ENCOUNTER — Telehealth: Payer: Self-pay | Admitting: Pharmacist

## 2020-09-28 ENCOUNTER — Other Ambulatory Visit: Payer: Self-pay | Admitting: Family Medicine

## 2020-09-28 NOTE — Chronic Care Management (AMB) (Addendum)
    Chronic Care Management Pharmacy Assistant   Name: PHUNG CHILDRESS  MRN: [id number] DOB: October 30, 1955  09/28/20-  called patient to remind of appointment with Jeni Salles) on (09/29/20 at [place] in office.)   No answer, left message of appointment date, time and type of appointment (either telephone or in person). Left message to have all medications, supplements, blood pressure and/or blood sugar logs available during appointment and to return call if need to reschedule.  Patient called back to be rescheduled to 10/14/20 at 10am in person. Patient scheduled and aware.   Care Gaps:  AWV - scheduled for 10/06/20 Foot exam - never done Ophthalmology exam - never done Zoster vaccines - never done Pap smear - overdue since 02/20/07 Covid - 19 vaccine booster 4 - overdue since 05/08/20 Influenza vaccine -  due  Star Rating Drug:  Rosuvastatin '10mg'$  - last filled on 08/29/20 30DS at [facility] Semaglutide '2mg'$  - last filled on 08/04/20 30DS at [facility]  Any gaps in medications fill history? No.  [place]  Clinical Pharmacist Assistant [phone]

## 2020-09-28 NOTE — Chronic Care Management (AMB) (Signed)
Chronic Care Management Pharmacy Assistant   Name: Samantha Hinton  MRN: [id number] DOB: [birth year]  Reason for Encounter: Medication Review/ Medication Coordination Call.    Recent office visits:  09/12/20 [name] DO [facility]) -  seen for type 2 diabetes and other issues. Patient started on polyethylene glycol 2 times daily. Increased ozempic to 0.'5mg'$  weekly. Follow up in 2 weeks.   08/30/20 Shanon Ace MD (PCP) - seen for hyperlipidemia and other chronic conditions. Patient started on ondansetron '4mg'$  every 8 hours as needed. Follow up in 4-6 months.   08/29/20 [name] DO [facility]) -  seen for type 2 diabetes and other issues. No medication changes. Follow up in 2 weeks.   Recent consult visits:  None.   Hospital visits:  None in previous 6 months  Medications: Outpatient Encounter Medications as of 09/28/2020  Medication Sig Note   albuterol (PROAIR HFA) 108 (90 Base) MCG/ACT inhaler 2 puffs every 4 hours as needed only  if your can't catch your breath    albuterol (PROVENTIL) (2.5 MG/3ML) 0.083% nebulizer solution Take 3 mLs (2.5 mg total) by nebulization every 6 (six) hours as needed for wheezing or shortness of breath.    allopurinol (ZYLOPRIM) 100 MG tablet TAKE ONE TABLET BY MOUTH TWICE DAILY    bisoprolol-hydrochlorothiazide (ZIAC) 5-6.25 MG tablet Take 1 tablet by mouth 2 (two) times daily.    BREZTRI AEROSPHERE 160-9-4.8 MCG/ACT AERO INHALE TWO PUFFS BY MOUTH INTO LUNGS TWICE DAILY    cloNIDine (CATAPRES) 0.1 MG tablet Take 1 tablet (0.1 mg total) by mouth 2 (two) times daily.    Colchicine 0.6 MG CAPS Take 0.6 mg by mouth 2 (two) times daily as needed. 07/14/2020: Using prn   gabapentin (NEURONTIN) 100 MG capsule TAKE ONE TABLET BY MOUTH TWICE DAILY    gabapentin (NEURONTIN) 300 MG capsule 1 pill nightly    loratadine (CLARITIN) 10 MG tablet Take 10 mg by mouth daily.    omeprazole (PRILOSEC) 40 MG capsule TAKE ONE CAPSULE BY MOUTH TWICE  DAILY    ondansetron (ZOFRAN ODT) 4 MG disintegrating tablet Take 1 tablet (4 mg total) by mouth every 8 (eight) hours as needed for nausea or vomiting.    polyethylene glycol (MIRALAX / GLYCOLAX) 17 g packet Take 17 g by mouth 2 (two) times daily. Until stooling regularly    predniSONE (DELTASONE) 10 MG tablet Take  4 each am x 2 days,   2 each am x 2 days,  1 each am x 2 days and stop    rosuvastatin (CRESTOR) 10 MG tablet Take 1 tablet (10 mg total) by mouth daily. For high cholesterol,avoid taking with cochicine    Semaglutide,0.25 or 0.'5MG'$ /DOS, (OZEMPIC, 0.25 OR 0.5 MG/DOSE,) 2 MG/1.5ML SOPN Inject 0.25 mg into the skin once a week.    Semaglutide,0.25 or 0.'5MG'$ /DOS, (OZEMPIC, 0.25 OR 0.5 MG/DOSE,) 2 MG/1.5ML SOPN Inject 0.5 mg into the skin once a week.    venlafaxine XR (EFFEXOR XR) 75 MG 24 hr capsule Take 1 capsule (75 mg total) by mouth daily with breakfast.    Vitamin D, Ergocalciferol, (DRISDOL) 1.25 MG (50000 UNIT) CAPS capsule Take 1 capsule (50,000 Units total) by mouth every 7 (seven) days.    Vitamin D, Ergocalciferol, (DRISDOL) 1.25 MG (50000 UNIT) CAPS capsule Take 1 capsule (50,000 Units total) by mouth every 7 (seven) days.    No facility-administered encounter medications on file as of 09/28/2020.   Fill History: albuterol sulfate HFA 90 mcg/actuation  aerosol inhaler 08/04/2020 25   allopurinol 100 mg tablet 08/29/2020 30   bisoprolol 5 mg-hydrochlorothiazide 6.25 mg tablet 08/29/2020 30   Breztri Aerosphere 160 mcg-41mg-4.8mcg/actuation HFA aerosol inhaler 08/29/2020 30   Vitamin D2 1,250 mcg (50,000 unit) capsule 09/26/2020 28   gabapentin 100 mg capsule 08/29/2020 30   omeprazole 40 mg capsule,delayed release 08/29/2020 30   ONDANSETRON ODT '4MG'$  TAB 08/30/2020 7   rosuvastatin 10 mg tablet 08/29/2020 30   Ozempic 0.25 mg or 0.5 mg (2 mg/1.5 mL) subcutaneous pen injector 08/04/2020 30   venlafaxine ER 75 mg capsule,extended release 24 hr 08/29/2020 30    buspirone 10 mg tablet 05/23/2020 30   clonidine HCl 0.1 mg tablet 08/29/2020 30   prednisone 10 mg tablet 08/04/2020 6   Reviewed chart for medication changes ahead of medication coordination call.  No OVs, Consults, or hospital visits since last care coordination call/Pharmacist visit. (If appropriate, list visit date, provider name)  No medication changes indicated OR if recent visit, treatment plan here.  BP Readings from Last 3 Encounters:  09/12/20 128/82  08/30/20 (!) 146/86  08/29/20 131/84    Lab Results  Component Value Date   HGBA1C 6.2 (H) 07/14/2020     Patient obtains medications through Adherence Packaging  30 Days   Last adherence delivery included: (medication name and frequency) Gabapentin 100 mg - take 1 tablet in the am and 1 tablet with dinner. Gabapentin 300 mg - take 1 tablet at bedtime. Prednisone 10 mg - take as needed for flare ups with breathing. Albuterol PROAIR inhaler - take 2 puffs every 4 hours as needed. Loratadine 10 mg - take daily in the morning  Venlafaxine 75 mg - take 1 tablet in the morning Omeprazole 40 mg - take 1 capsule twice daily in the morning and with dinner Rosuvastatin 10 mg - take 1 tablet with dinner Allopurinol 100 mg - take 1 tablet twice daily in the morning and with dinner Bisoprolol- HCTZ 5-6.25 MG - take 1 tablet twice daily in the morning and with dinner Breztri inhaler - 2 puffs twice daily Clonidine 0.'1mg'$  - take 1 tablet twice daily in the morning and with dinner  Patient declined medications below last month due to PRN use. Clonidine 0.'1mg'$  - take 1 tablet twice daily in the morning and with dinner Albuterol (PROAIR HFA) 108 (90 Base) MCG/ACT inhaler    Patient is due for next adherence delivery on: 09/30/20. Called patient and reviewed medications and coordinated delivery.  This delivery to include: Gabapentin 100 mg - take 1 tablet in the am and 1 tablet with dinner. Gabapentin 300 mg - take 1 tablet at  bedtime. Albuterol PROAIR inhaler - take 2 puffs every 4 hours as needed. Loratadine 10 mg - take daily in the morning  Venlafaxine 75 mg - take 1 tablet in the morning Omeprazole 40 mg - take 1 capsule twice daily in the morning and with dinner Rosuvastatin 10 mg - take 1 tablet with dinner Allopurinol 100 mg - take 1 tablet twice daily in the morning and with dinner Bisoprolol- HCTZ 5-6.25 MG - take 1 tablet twice daily in the morning and with dinner Breztri inhaler - 2 puffs twice daily Clonidine 0.'1mg'$  - take 1 tablet twice daily in the morning and with dinner  Patient declined the following medications due to PRN use.  Prednisone 10 mg - take as needed for flare ups with breathing.  Confirmed delivery date of 09/30/20, advised patient that pharmacy will contact  them the morning of delivery.   Care Gaps:    AWV - patient contacted to schedule. Foot Exam Eye Exam Zoster Vaccines PAP Smear Mammogram Covid-19 4th Booster   Star Rating Drugs:  Rosuvastatin '10mg'$  - last filled on 08/29/20 30DS at [place] Pharmacist Assistant [phone]

## 2020-09-29 ENCOUNTER — Ambulatory Visit: Payer: Medicare Other

## 2020-10-06 ENCOUNTER — Ambulatory Visit: Payer: Medicare Other

## 2020-10-10 ENCOUNTER — Ambulatory Visit (INDEPENDENT_AMBULATORY_CARE_PROVIDER_SITE_OTHER): Payer: Medicare Other | Admitting: Family Medicine

## 2020-10-10 ENCOUNTER — Other Ambulatory Visit: Payer: Self-pay

## 2020-10-10 ENCOUNTER — Encounter (INDEPENDENT_AMBULATORY_CARE_PROVIDER_SITE_OTHER): Payer: Self-pay | Admitting: Family Medicine

## 2020-10-10 VITALS — BP 131/67 | HR 81 | Temp 98.1°F | Ht 64.0 in | Wt 211.0 lb

## 2020-10-10 DIAGNOSIS — E1169 Type 2 diabetes mellitus with other specified complication: Secondary | ICD-10-CM | POA: Diagnosis not present

## 2020-10-10 DIAGNOSIS — E559 Vitamin D deficiency, unspecified: Secondary | ICD-10-CM

## 2020-10-10 DIAGNOSIS — Z6839 Body mass index (BMI) 39.0-39.9, adult: Secondary | ICD-10-CM | POA: Diagnosis not present

## 2020-10-10 DIAGNOSIS — K5909 Other constipation: Secondary | ICD-10-CM | POA: Diagnosis not present

## 2020-10-10 MED ORDER — VITAMIN D (ERGOCALCIFEROL) 1.25 MG (50000 UNIT) PO CAPS: 50000.0000 [IU] | ORAL_CAPSULE | ORAL | 0 refills | Status: DC

## 2020-10-10 MED ORDER — SEMAGLUTIDE (1 MG/DOSE) 4 MG/3ML ~~LOC~~ SOPN: 1.0000 mg | PEN_INJECTOR | SUBCUTANEOUS | 0 refills | Status: DC

## 2020-10-10 NOTE — Progress Notes (Signed)
Chief Complaint:   OBESITY Samantha Hinton is here to discuss her progress with her obesity treatment plan along with follow-up of her obesity related diagnoses. Samantha Hinton is on the Category 3 Plan with breakfast options and states she is following her eating plan approximately 75% of the time. Samantha Hinton states she is not currently exercising.  Today's visit was #: 5 Starting weight: 230 lbs Starting date: 07/14/2020 Today's weight: 211 lbs Today's date: 10/10/2020 Total lbs lost to date: 19 Total lbs lost since last in-office visit: 4  Interim History: Samantha Hinton was in [place] for a week and didn't eat on plan, but she did [place]. She is avoiding sweets/carbs and drinking water.  Assessment/Plan:   Orders Placed This Encounter  Procedures   Hemoglobin A1c   VITAMIN D 25 Hydroxy (Vit-D Deficiency, Fractures)    Medications Discontinued During This Encounter  Medication Reason   Semaglutide,0.25 or 0.'5MG'$ /DOS, (OZEMPIC, 0.25 OR 0.5 MG/DOSE,) 2 MG/1.5ML SOPN    Vitamin D, Ergocalciferol, (DRISDOL) 1.25 MG (50000 UNIT) CAPS capsule    Semaglutide,0.25 or 0.'5MG'$ /DOS, (OZEMPIC, 0.25 OR 0.5 MG/DOSE,) 2 MG/1.5ML SOPN    Vitamin D, Ergocalciferol, (DRISDOL) 1.25 MG (50000 UNIT) CAPS capsule Reorder   gabapentin (NEURONTIN) 300 MG capsule Error     Meds ordered this encounter  Medications   Vitamin D, Ergocalciferol, (DRISDOL) 1.25 MG (50000 UNIT) CAPS capsule    Sig: Take 1 capsule (50,000 Units total) by mouth every 7 (seven) days.    Dispense:  4 capsule    Refill:  0    Ov for rf   Semaglutide, 1 MG/DOSE, 4 MG/3ML SOPN    Sig: Inject 1 mg as directed once a week.    Dispense:  3 mL    Refill:  0    One mo supply     1. Type 2 diabetes mellitus with other specified complication, without long-term current use of insulin (Anza) Diabetes Mellitus: Not at goal. Medication: Ozempic. Issues reviewed: blood sugar goals, complications of diabetes mellitus, hypoglycemia prevention and treatment,  exercise, and nutrition.   Plan: Refill and increase Semaglutide from 0.5 mg to 1.0 mg as tolerated. Check labs at next OV.  Lab Results  Component Value Date   HGBA1C 6.2 (H) 07/14/2020   HGBA1C 6.0 05/09/2020   HGBA1C 6.9 (H) 08/04/2019   Lab Results  Component Value Date   MICROALBUR 5.2 (H) 05/09/2020   LDLCALC 116 (H) 05/24/2014   CREATININE 1.01 05/09/2020    Refill and Increase- Semaglutide, 1 MG/DOSE, 4 MG/3ML SOPN; Inject 1 mg as directed once a week.  Dispense: 3 mL; Refill: 0 - Hemoglobin A1c  2. Vitamin D deficiency Not at goal.   Plan: Continue to take prescription Vitamin D '@50'$ ,000 IU every week as prescribed.  Follow-up for routine testing of Vitamin D, at least 2-3 times per year to avoid over-replacement. Check labs at next OV.  Lab Results  Component Value Date   VD25OH 24.6 (L) 07/14/2020   Refill- Vitamin D, Ergocalciferol, (DRISDOL) 1.25 MG (50000 UNIT) CAPS capsule; Take 1 capsule (50,000 Units total) by mouth every 7 (seven) days.  Dispense: 4 capsule; Refill: 0 - VITAMIN D 25 Hydroxy (Vit-D Deficiency, Fractures)  3. Other constipation Constipation has now resolved with Miralax QD. Samantha Hinton has no issues.  Plan: Continue to increase water intake- 7 bottles per day is the goal. Continue Miralax and high fiber foods.  4. Obesity with current BMI of 36.4  Samantha Hinton is currently in the action stage  of change. As such, her goal is to continue with weight loss efforts. She has agreed to the Category 3 Plan with breakfast options.   Check labs at next OV.  Exercise goals:  As is  Behavioral modification strategies: decreasing simple carbohydrates, increasing water intake, and increasing high fiber foods.  Samantha Hinton has agreed to follow-up with our clinic in 2-3 weeks. She was informed of the importance of frequent follow-up visits to maximize her success with intensive lifestyle modifications for her multiple health conditions.   Objective:   Blood  pressure 131/67, pulse 81, temperature 98.1 F (36.7 C), height '5\' 4"'$  (1.626 m), weight 211 lb (95.7 kg), SpO2 94 %. Body mass index is 36.22 kg/m.  General: Cooperative, alert, well developed, in no acute distress. HEENT: Conjunctivae and lids unremarkable. Cardiovascular: Regular rhythm.  Lungs: Normal work of breathing. Neurologic: No focal deficits.   Lab Results  Component Value Date   CREATININE 1.01 05/09/2020   BUN 22 05/09/2020   NA 137 05/09/2020   K 4.5 05/09/2020   CL 93 (L) 05/09/2020   CO2 33 (H) 05/09/2020   Lab Results  Component Value Date   ALT 35 05/09/2020   AST 58 (H) 05/09/2020   ALKPHOS 79 05/09/2020   BILITOT 1.1 05/09/2020   Lab Results  Component Value Date   HGBA1C 6.2 (H) 07/14/2020   HGBA1C 6.0 05/09/2020   HGBA1C 6.9 (H) 08/04/2019   Lab Results  Component Value Date   INSULIN 6.1 07/14/2020   Lab Results  Component Value Date   TSH 3.06 05/09/2020   Lab Results  Component Value Date   CHOL 330 (H) 05/09/2020   HDL 63.00 05/09/2020   LDLCALC 116 (H) 05/24/2014   LDLDIRECT 242.0 05/09/2020   TRIG 255.0 (H) 05/09/2020   CHOLHDL 5 05/09/2020   Lab Results  Component Value Date   VD25OH 24.6 (L) 07/14/2020   Lab Results  Component Value Date   WBC 6.2 05/09/2020   HGB 13.6 05/09/2020   HCT 41.2 05/09/2020   MCV 94.1 05/09/2020   PLT 195.0 05/09/2020   Lab Results  Component Value Date   IRON 112 04/10/2019   FERRITIN 160.8 04/10/2019    Obesity Behavioral Intervention:   Approximately 15 minutes were spent on the discussion below.  ASK: We discussed the diagnosis of obesity with Samantha Hinton today and Samantha Hinton agreed to give [place] permission to discuss obesity behavioral modification therapy today.  ASSESS: Samantha Hinton has the diagnosis of obesity and her BMI today is 36.4. Samantha Hinton is in the action stage of change.   ADVISE: Samantha Hinton was educated on the multiple health risks of obesity as well as the benefit of weight loss to  improve her health. She was advised of the need for long term treatment and the importance of lifestyle modifications to improve her current health and to decrease her risk of future health problems.  AGREE: Multiple dietary modification options and treatment options were discussed and Samantha Hinton agreed to follow the recommendations documented in the above note.  ARRANGE: Samantha Hinton was educated on the importance of frequent visits to treat obesity as outlined per CMS and USPSTF guidelines and agreed to schedule her next follow up appointment today.  Attestation Statements:   Reviewed by clinician on day of visit: allergies, medications, problem list, medical history, surgical history, family history, social history, and previous encounter notes.  Samantha Hinton, CMA, am acting as transcriptionist for [employer], DO.  I have reviewed the above documentation for accuracy  and completeness, and I agree with the above. Samantha Hinton, D.O.  The [place] was signed into law in 2016 which includes the topic of electronic health records.  This provides immediate access to information in MyChart.  This includes consultation notes, operative notes, office notes, lab results and pathology reports.  If you have any questions about what you read please let us know at your next visit so we can discuss your concerns and take corrective action if need be.  We are right here with you.

## 2020-10-13 ENCOUNTER — Telehealth: Payer: Self-pay | Admitting: Pharmacist

## 2020-10-13 NOTE — Chronic Care Management (AMB) (Signed)
    Chronic Care Management Pharmacy Assistant   Name: Samantha Hinton  MRN: [id number] DOB: 1955-03-04  10/14/20 APPOINTMENT REMINDER   Called [name], No answer, left message of appointment on 10/14/20 at 10am via office visit with Jeni Salles, Pharm D. Notified to have all medications, supplements, blood pressure and/or blood sugar logs available during appointment and to return call if need to reschedule.   Care Gaps:  AWV - canceled on 10/06/20 and to be rescheduled. Foot exam - never done Ophthalmology exam - never done Zoster vaccines - never done Pap mere - overdue since 2009 Covid - 19 vaccine booster 4 - overdue since 05/08/20  Flu vaccine - due  Star Rating Drug:  Rosuvastatin '10mg'$  - last filled on 09/28/20 30DS at [facility] Semaglutide '1mg'$  - last filled on 09/29/20 28DS at [facility]  Any gaps in medications fill history? No.  [place]  Clinical Pharmacist Assistant [phone]

## 2020-10-14 ENCOUNTER — Telehealth: Payer: Self-pay | Admitting: Internal Medicine

## 2020-10-14 ENCOUNTER — Ambulatory Visit: Payer: Medicare Other

## 2020-10-14 NOTE — Telephone Encounter (Signed)
Left message for patient to call back and schedule Medicare Annual Wellness Visit (AWV) either virtually or in office. Left  my Samantha Hinton number [phone]   Last AWV ;07/06/15  please schedule at anytime with [facility] Nurse Health Advisor 1 or 2   This should be a 45 minute visit.

## 2020-10-20 IMAGING — DX CHEST - 2 VIEW
2 series · 2 of 2 positions shown · non-contrast
Comparison: February 11, 2014

CLINICAL DATA: Shortness of breath

EXAM:
CHEST - 2 VIEW

[chest pa]
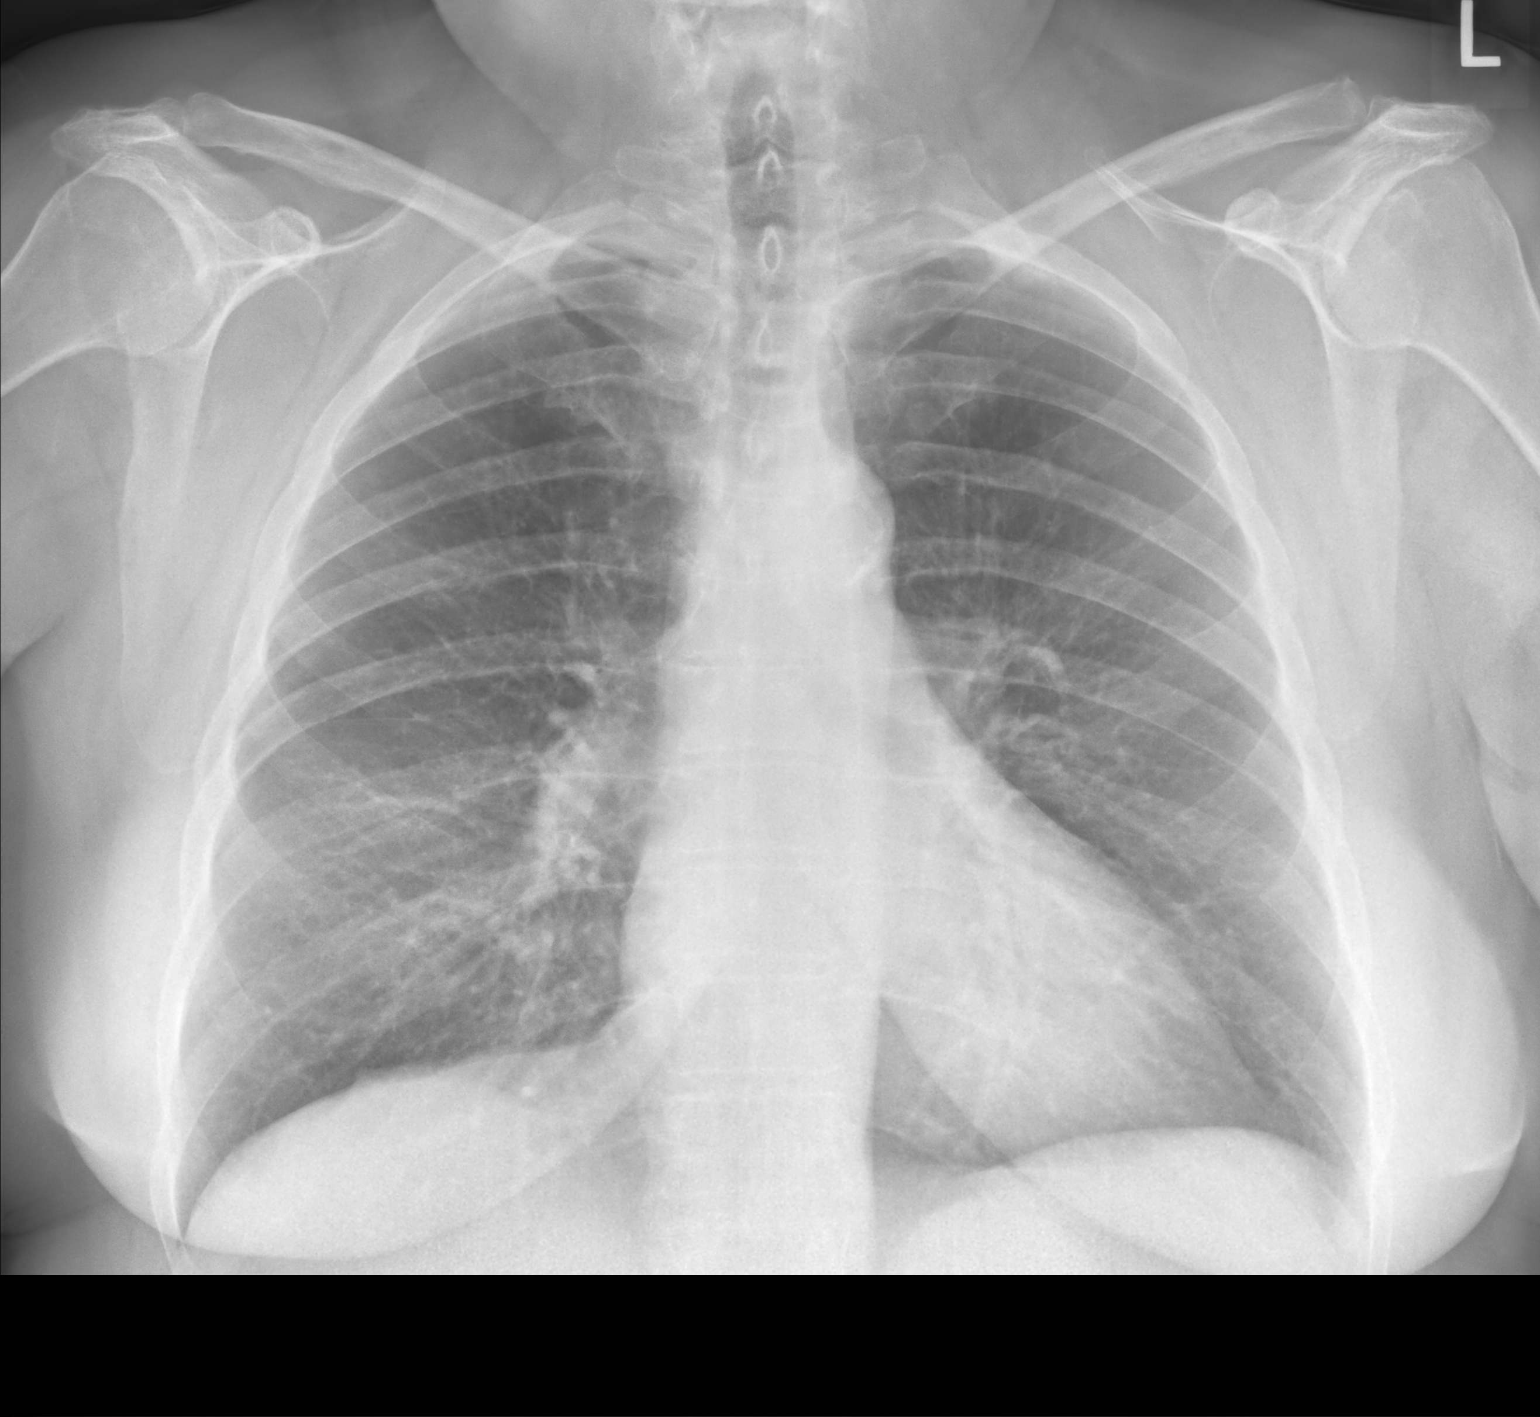

[chest lat]
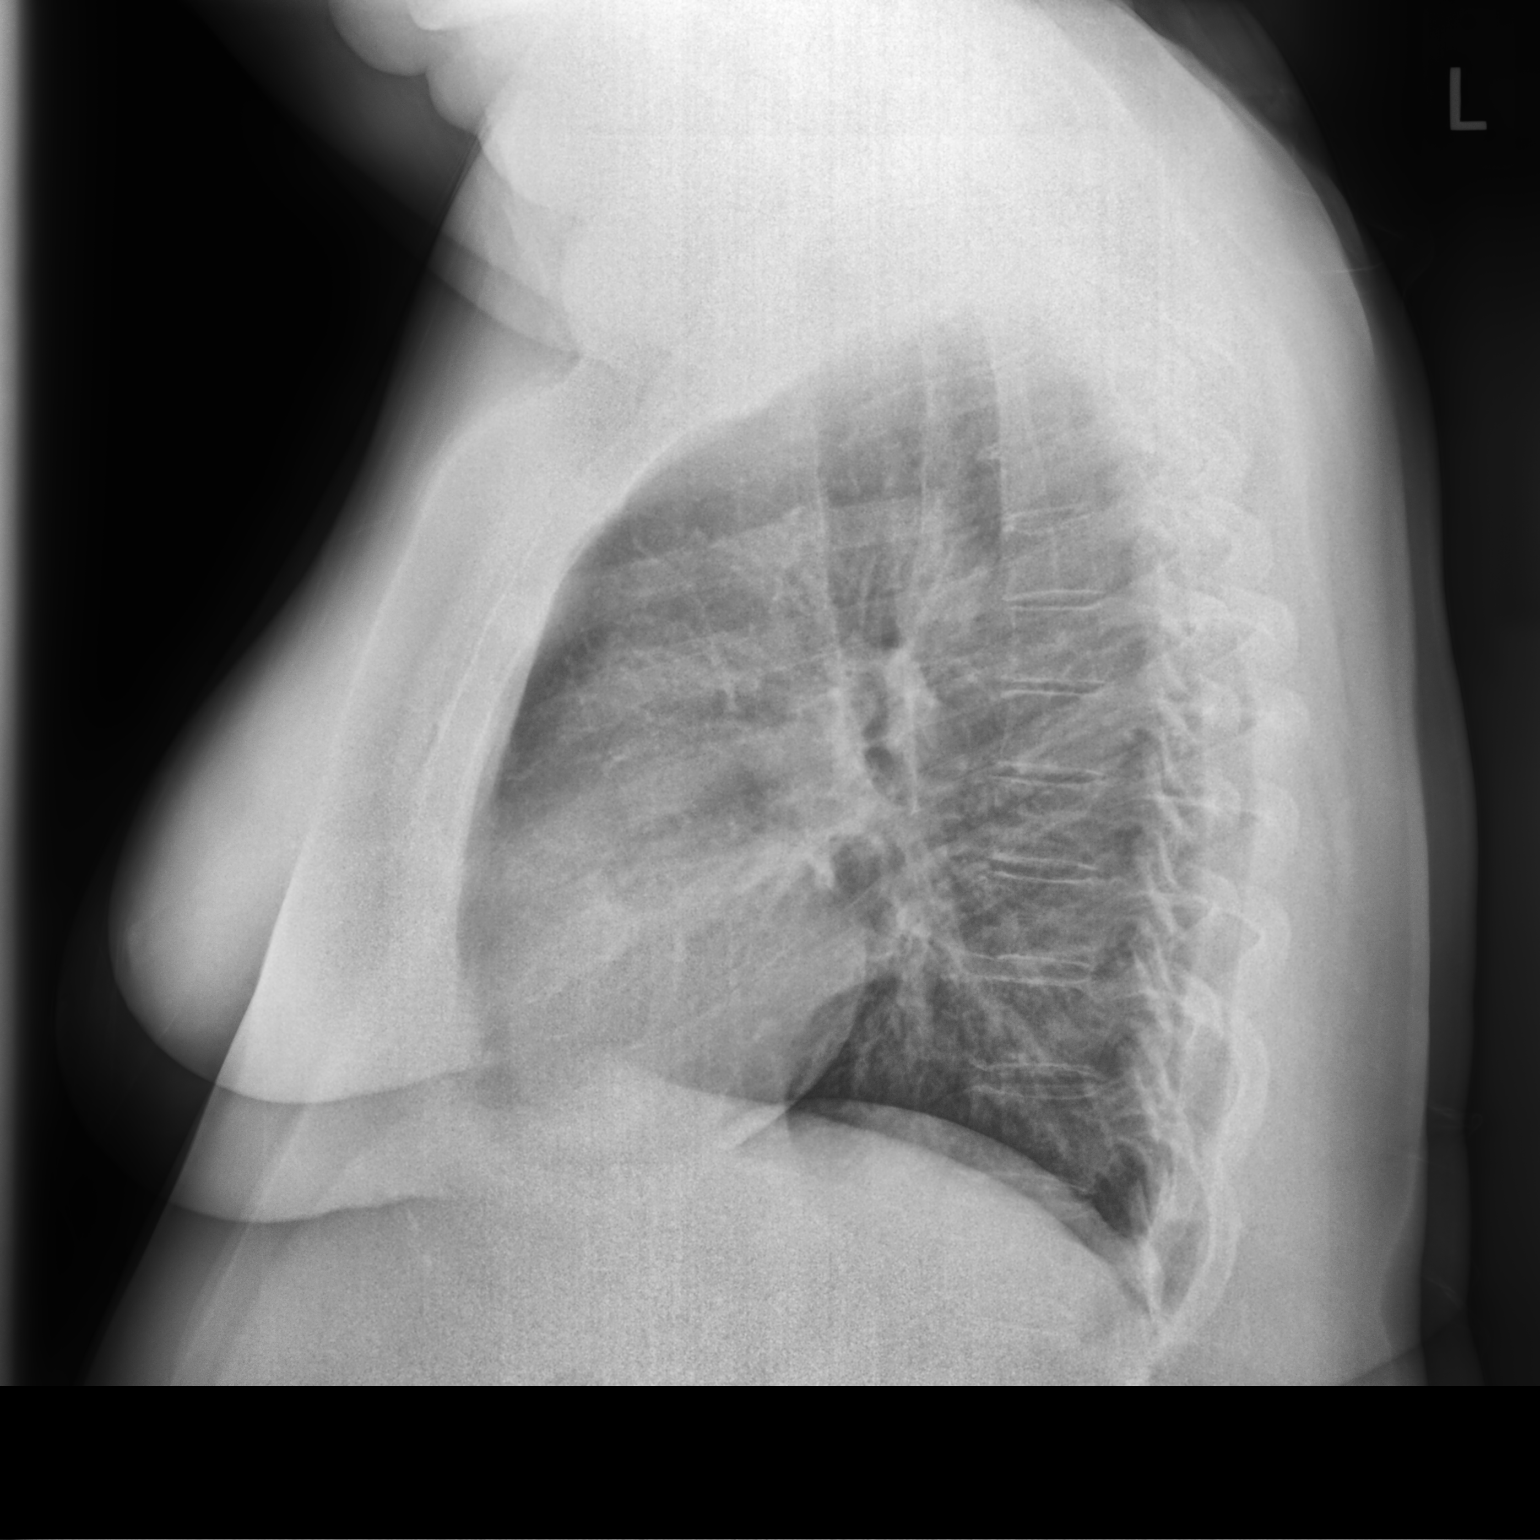

[2 of 2 positions shown; findings below may reference images not displayed]

FINDINGS: There is no edema or consolidation. Heart size and pulmonary
vascularity are normal. No adenopathy. No bone lesions.
IMPRESSION: No edema or consolidation.

## 2020-10-21 ENCOUNTER — Telehealth: Payer: Self-pay | Admitting: Pharmacist

## 2020-10-21 NOTE — Chronic Care Management (AMB) (Signed)
Chronic Care Management Pharmacy Assistant   Name: Samantha Hinton  MRN: [id number] DOB: [birth year]  Reason for Encounter: Disease State and Medication Review/ General Assessment and Medication Coordination Call.   Conditions to be addressed/monitored: HTN and COPD   Recent office visits:  None.  Recent consult visits:  10/10/20 [name] DO [facility]) - seen for weight management clinic follow up. Changed gabapentin from '300mg'$  nightly to '100mg'$  one tablet twice daily. Increased ozempic to '1mg'$  weekly. Follow up in 2-3 weeks.  Hospital visits:  None in previous 6 months  Medications: Outpatient Encounter Medications as of 10/21/2020  Medication Sig Note   albuterol (PROAIR HFA) 108 (90 Base) MCG/ACT inhaler 2 puffs every 4 hours as needed only  if your can't catch your breath    albuterol (PROVENTIL) (2.5 MG/3ML) 0.083% nebulizer solution Take 3 mLs (2.5 mg total) by nebulization every 6 (six) hours as needed for wheezing or shortness of breath.    allopurinol (ZYLOPRIM) 100 MG tablet TAKE ONE TABLET BY MOUTH TWICE DAILY    bisoprolol-hydrochlorothiazide (ZIAC) 5-6.25 MG tablet Take 1 tablet by mouth 2 (two) times daily.    BREZTRI AEROSPHERE 160-9-4.8 MCG/ACT AERO INHALE TWO PUFFS BY MOUTH INTO LUNGS TWICE DAILY    cloNIDine (CATAPRES) 0.1 MG tablet Take 1 tablet (0.1 mg total) by mouth 2 (two) times daily.    Colchicine 0.6 MG CAPS Take 0.6 mg by mouth 2 (two) times daily as needed. 07/14/2020: Using prn   gabapentin (NEURONTIN) 100 MG capsule TAKE ONE TABLET BY MOUTH TWICE DAILY    loratadine (CLARITIN) 10 MG tablet Take 10 mg by mouth daily.    omeprazole (PRILOSEC) 40 MG capsule TAKE ONE CAPSULE BY MOUTH TWICE DAILY    ondansetron (ZOFRAN ODT) 4 MG disintegrating tablet Take 1 tablet (4 mg total) by mouth every 8 (eight) hours as needed for nausea or vomiting.    polyethylene glycol (MIRALAX / GLYCOLAX) 17 g packet Take 17 g by mouth 2 (two) times daily. Until  stooling regularly    predniSONE (DELTASONE) 10 MG tablet Take  4 each am x 2 days,   2 each am x 2 days,  1 each am x 2 days and stop    rosuvastatin (CRESTOR) 10 MG tablet Take 1 tablet (10 mg total) by mouth daily. For high cholesterol,avoid taking with cochicine    Semaglutide, 1 MG/DOSE, 4 MG/3ML SOPN Inject 1 mg as directed once a week.    venlafaxine XR (EFFEXOR XR) 75 MG 24 hr capsule Take 1 capsule (75 mg total) by mouth daily with breakfast.    Vitamin D, Ergocalciferol, (DRISDOL) 1.25 MG (50000 UNIT) CAPS capsule Take 1 capsule (50,000 Units total) by mouth every 7 (seven) days.    No facility-administered encounter medications on file as of 10/21/2020.   Fill History: albuterol sulfate HFA 90 mcg/actuation aerosol inhaler 08/04/2020 25   allopurinol 100 mg tablet 09/28/2020 30   bisoprolol 5 mg-hydrochlorothiazide 6.25 mg tablet 09/28/2020 30   Breztri Aerosphere 160 mcg-68mg-4.8mcg/actuation HFA aerosol inhaler 09/28/2020 30   Vitamin D2 1,250 mcg (50,000 unit) capsule 09/26/2020 28   gabapentin 100 mg capsule 09/28/2020 30   omeprazole 40 mg capsule,delayed release 09/28/2020 30   ONDANSETRON ODT '4MG'$  TAB 08/30/2020 7   rosuvastatin 10 mg tablet 09/28/2020 30   OZEMPIC '2MG'$ /1.5ML 0.25/0.5 PEN 09/29/2020 28   venlafaxine ER 75 mg capsule,extended release 24 hr 09/28/2020 30   buspirone 10 mg tablet 05/23/2020 30   clonidine  HCl 0.1 mg tablet 09/28/2020 30   prednisone 10 mg tablet 08/04/2020 6   Current COPD regimen:    Albuterol (PROAIR) 108 - take 2 puffs every 4 hours as needed.       Albuterol (PROVENTIL) Solution - breathing treatments as               needed.      Breztri 160-9-4.8 - inhale two puffs twice daily.    Any recent hospitalizations or ED visits since last visit with CPP? No Reports denies COPD symptoms, including  What recent interventions/DTPs have been made by any provider to improve breathing since last visit: Have you had  exacerbation/flare-up since last visit?  What do you do when you are short of breath?    Respiratory Devices/Equipment Do you have a nebulizer? Yes Do you use a Peak Flow Meter? No Do you use a maintenance inhaler? Yes How often do you forget to use your daily inhaler?  Do you use a rescue inhaler? Yes How often do you use your rescue inhaler?   Do you use a spacer with your inhaler? No  Adherence Review: Does the patient have >5 day gap between last estimated fill date for maintenance inhaler medications? No   Reviewed chart prior to disease state call. Spoke with patient regarding BP  Recent Office Vitals: BP Readings from Last 3 Encounters:  10/10/20 131/67  09/12/20 128/82  08/30/20 (!) 146/86   Pulse Readings from Last 3 Encounters:  10/10/20 81  09/12/20 86  08/30/20 96    Wt Readings from Last 3 Encounters:  10/10/20 211 lb (95.7 kg)  09/12/20 215 lb (97.5 kg)  08/30/20 218 lb (98.9 kg)     Kidney Function Lab Results  Component Value Date/Time   CREATININE 1.01 05/09/2020 10:22 AM   CREATININE 0.86 06/30/2019 09:09 AM   GFR 58.85 (L) 05/09/2020 10:22 AM   GFRNONAA >60 12/27/2014 07:57 AM   GFRAA >60 12/27/2014 07:57 AM    BMP Latest Ref Rng & Units 05/09/2020 06/30/2019 04/10/2019  Glucose 70 - 99 mg/dL 114(H) 127(H) 122(H)  BUN 6 - 23 mg/dL 22 14 -  Creatinine 0.40 - 1.20 mg/dL 1.01 0.86 -  Sodium 135 - 145 mEq/L 137 137 -  Potassium 3.5 - 5.1 mEq/L 4.5 3.9 -  Chloride 96 - 112 mEq/L 93(L) 96 -  CO2 19 - 32 mEq/L 33(H) 28 -  Calcium 8.4 - 10.5 mg/dL 9.5 9.0 -    Current antihypertensive regimen:  Bisoprolol-HCTZ 5-6.25 mg 1 tablet twice daily Clonidine 0.1 mg 1 tablet twice daily  How often are you checking your Blood Pressure?  Current home BP readings:  What recent interventions/DTPs have been made by any provider to improve Blood Pressure control since last CPP Visit: None. Any recent hospitalizations or ED visits since last visit with CPP?  No What diet changes have been made to improve Blood Pressure Control?  What exercise is being done to improve your Blood Pressure Control?    Adherence Review: Is the patient currently on ACE/ARB medication? No Does the patient have >5 day gap between last estimated fill dates? No   Reviewed chart for medication changes ahead of medication coordination call.  No OVs, Consults, or hospital visits since last care coordination call/Pharmacist visit. (If appropriate, list visit date, provider name)  No medication changes indicated OR if recent visit, treatment plan here.  BP Readings from Last 3 Encounters:  10/10/20 131/67  09/12/20 128/82  08/30/20 Marland Kitchen)  146/86    Lab Results  Component Value Date   HGBA1C 6.2 (H) 07/14/2020     Patient obtains medications through Adherence Packaging  30 Days   Last adherence delivery included:  Gabapentin 100 mg - take 1 tablet in the am and 1 tablet with dinner. Gabapentin 300 mg - take 1 tablet at bedtime. Albuterol PROAIR inhaler - take 2 puffs every 4 hours as needed. Loratadine 10 mg - take daily in the morning  Venlafaxine 75 mg - take 1 tablet in the morning Omeprazole 40 mg - take 1 capsule twice daily in the morning and with dinner Rosuvastatin 10 mg - take 1 tablet with dinner Allopurinol 100 mg - take 1 tablet twice daily in the morning and with dinner Bisoprolol- HCTZ 5-6.25 MG - take 1 tablet twice daily in the morning and with dinner Breztri inhaler - 2 puffs twice daily Clonidine 0.'1mg'$  - take 1 tablet twice daily in the morning and with dinner Patient declined medication due to PRN USE.  Prednisone 10 mg - take as needed for flare ups with breathing.  Patient is due for next adherence delivery on: 10/31/20. Called patient and reviewed medications and coordinated delivery.  This delivery to include: Gabapentin 100 mg - take 1 tablet in the am and 1 tablet with dinner. Gabapentin 300 mg - take 1 tablet at bedtime. Albuterol  PROAIR inhaler - take 2 puffs every 4 hours as needed. Loratadine 10 mg - take daily in the morning  Venlafaxine 75 mg - take 1 tablet in the morning Omeprazole 40 mg - take 1 capsule twice daily in the morning and with dinner Rosuvastatin 10 mg - take 1 tablet with dinner Allopurinol 100 mg - take 1 tablet twice daily in the morning and with dinner Bisoprolol- HCTZ 5-6.25 MG - take 1 tablet twice daily in the morning and with dinner Breztri inhaler - 2 puffs twice daily Clonidine 0.'1mg'$  - take 1 tablet twice daily in the morning and with dinner  Patient will need a short fill of (med), prior to adherence delivery. (To align with sync date or if PRN med)  Coordinated acute fill for (med) to be delivered (date).  Patient declined the following medication due to PRN USE.  Prednisone 10 mg - take as needed for flare ups with breathing.  Patient needs refills for   Confirmed delivery date of 10/31/20, advised patient that pharmacy will contact them the morning of delivery.   Multiple unsuccessful attempt to reach patient by phone.   Care Gaps:  AWV - patient contacted to schedule. Foot Exam - never done Eye Exam - never done Zoster Vaccines - never done PAP Smear -  overdue since 2009 Covid-19 4th Booster - overdue since 05/08/20 Flu vaccine - due   Star Rating Drugs:  Rosuvastatin '10mg'$  - last filled on 09/28/20 30DS at [place] Pharmacist Assistant [phone]

## 2020-10-27 NOTE — Telephone Encounter (Cosign Needed)
2nd attempt

## 2020-10-31 ENCOUNTER — Ambulatory Visit (INDEPENDENT_AMBULATORY_CARE_PROVIDER_SITE_OTHER): Payer: Medicare Other | Admitting: Family Medicine

## 2020-11-07 ENCOUNTER — Ambulatory Visit: Payer: Medicare Other | Admitting: Internal Medicine

## 2020-11-14 ENCOUNTER — Other Ambulatory Visit: Payer: Self-pay

## 2020-11-14 ENCOUNTER — Encounter: Payer: Self-pay | Admitting: Internal Medicine

## 2020-11-14 ENCOUNTER — Ambulatory Visit (INDEPENDENT_AMBULATORY_CARE_PROVIDER_SITE_OTHER): Payer: Medicare Other | Admitting: Internal Medicine

## 2020-11-14 VITALS — BP 106/68 | HR 97 | Temp 98.4°F | Ht 65.0 in | Wt 204.0 lb

## 2020-11-14 DIAGNOSIS — Z23 Encounter for immunization: Secondary | ICD-10-CM | POA: Diagnosis not present

## 2020-11-14 DIAGNOSIS — J449 Chronic obstructive pulmonary disease, unspecified: Secondary | ICD-10-CM

## 2020-11-14 NOTE — Assessment & Plan Note (Addendum)
Quit smoking 01/2014   - PFT's 06/12/2012 FEV1  1.41 (60%) ratio 64 and no better p B2  And DLCO 51 corrects to 94 % - 06/12/2012 refer to Rehab - 08/02/2012 started tudorza > improved - 06/19/2013  >  Started symbicort 2 bid  - 08/11/2018    change symb to bevespi 2 bid And max gerd rx   Alpha one AT screen 08/11/2018  MM  Level  145  - 09/10/2018   continue bevespi - 06/29/2019  After extensive coaching inhaler device,  effectiveness =   90% change to breztri  - 05/05/2020 added pred x 6 days as Plan D    Group D in terms of symptom/risk and laba/lama/ICS  therefore appropriate rx at this point >>>  Continue breztri and prn saba  Re SABA :  I spent extra time with pt today reviewing appropriate use of albuterol for prn use on exertion with the following points: 1) saba is for relief of sob that does not improve by walking a slower pace or resting but rather if the pt does not improve after trying this first. 2) If the pt is convinced, as many are, that saba helps recover from activity faster then it's easy to tell if this is the case by re-challenging : ie stop, take the inhaler, then p 5 minutes try the exact same activity (intensity of workload) that just caused the symptoms and see if they are substantially diminished or not after saba 3) if there is an activity that reproducibly causes the symptoms, try the saba 15 min before the activity on alternate days   If in fact the saba really does help, then fine to continue to use it prn but advised may need to look closer at the maintenance regimen being used to achieve better control of airways disease with exertion.   Rec also High dose flu covid 19 omicron variant booster   >>> f/u here in 12 m, sooner if needed          Each maintenance medication was reviewed in detail including emphasizing most importantly the difference between maintenance and prns and under what circumstances the prns are to be triggered using an action plan format where  appropriate.  Total time for H and P, chart review, counseling, reviewing hfa device(s) and generating customized AVS unique to this office visit / same day charting = 22 min

## 2020-11-14 NOTE — Progress Notes (Signed)
Subjective:   Patient ID: Samantha Hinton, female    DOB: 15-Aug-1955 MRN: [id number]    Brief patient profile:  70 yowf MM quit smoking 01/2014  with GOLD II COPD  baseline doe x best days able to do extensive shopping s 02 better with albuterol when had access to it then admitted [facility]:   Admit Date: 02/17/2012  PRIMARY DISCHARGE DIAGNOSIS:  URI (upper respiratory infection)  COPD exacerbation  SOB (shortness of breath)  Hypoxemia  UTI (lower urinary tract infection)  Diarrhea  Tobacco abuse    04/10/2012 1st pulmonary ov  On ACEI cc doe x 5 steps on neb and albuterol 7 x daily with persistent sense of chest tightness no better since discharge rec Stop lisinopril and start bystolic 5 mg one daily Start dulera 100 Take 2 puffs first thing in am and then another 2 puffs about 12 hours later.  Plan B  Only use your albuterol inhaler, plan C is your nebulizer) as a rescue medication   GERD diet   05/08/2012 f/u ov/Sherran Margolis cc breathing much better on dulera 878 and bystolic no need for saba or neb despite head and chest cold since last ov. rec Start bisoprolol 5 mg daily in place of bystolic - break in half daily if too strong dulera 200 Take 2 puffs first thing in am and then another 2 puffs about 12 hours later.  06/12/2012 f/u ov/Jamarrius Salay re copd Chief Complaint  Patient presents with   Follow-up    Breathing some worse for the past 2 wks, relates to allergy season. Cough has improve some.    takes zyrtec year round, very nervous using saba 1-2 x daily but typically not at rest or hs.  Minimal impact on activity tolerance though. rec Zyrtec should be used one daily as needed for itching sneezing runny nose Blow out through through the nose Please see patient coordinator before you leave today  to schedule pulmonary rehab > placed on wt list  Prednisone 10 mg take  4 each am x 2 days,   2 each am x 2 days,  1 each am x2days and stop   08/01/2012 f/u ov/Radley Teston still smoking Chief Complaint   Patient presents with   Follow-up    Pt states that her breathing is unchanged since last visit. Cough is some better.   still using hfa albuterol before  And after dulera but  not needing nebulizer alb and not limited from desired activities - still on wt list for rehab. Not convinced dulera really helping her or reducing perceived need for saba and no real change since ran out x > 2 weeks prior to OV   rec tudorza one twice daily on a trial basis Only use your albuterol (ventolin 1st, nebulizer is second)     08/13/2014 f/u ov/Areyana Leoni re: GOLD II copd/ quit smoking Dec 2015 / not using symb regularly  Chief Complaint  Patient presents with   Follow-up    Pt here for medication refill. She states her breathing hsa been doing well. She uses albuterol inhaler 1 x daily on average. She rarely uses nebs.   no real change off symbicort up to a month at a time  Only sob overdoes/ sleeps fine  rec Ok to change symbicort to take  as needed Take 2 puffs first thing in am and then another 2 puffs about 12 hours later.  Only use your albuterol as a rescue medication     08/11/2018 acute extended ov/Tyheem Boughner re:  Re-establish GOLD II copd/ worse sob still on symb 160 2bid  Chief Complaint  Patient presents with   Pulmonary Consult    Self referral. Pt c/o increased SOB since Spring 2020. She gets winded walking room to room at home.   MMRC3 = can't walk 100 yards even at a slow pace at a flat grade s stopping due to sob  Progressive worse no variability since December 2019 Hs = gurgly on back / overt hb on just prilosec 20 mg not ac No cough  Mild leg swelling   Neb but never before ex  rec Plan A = Automatic = Bevespi Take 2 puffs first thing in am and then another 2 puffs about 12 hours later.  Work on inhaler technique:   Plan B = Backup Only use your albuterol inhaler as a rescue medication to be used if you can't catch your breath Plan C = Crisis - only use your albuterol nebulizer if you  first try Plan B and it fails to help > ok to use the nebulizer up to every 4 hours but if start needing it regularly call for immediate appointment Omeprazole 40 mg Take 30- 60 min before your first and last meals of the day  GERD diet    09/10/2018  f/u ov/Alverta Caccamo re:  GOLD II maint on bevespi 2 bid improved  Chief Complaint  Patient presents with   Follow-up    Breathing has improved some. She does not have a rescue inhaler.    Dyspnea:  MMRC2 = can't walk a nl pace on a flat grade s sob but does fine slow and flat  Cough: none  Sleeping: ok flat/ 1-2 pillows  SABA use: none 02: none  rec Plan A = Automatic = Bevespi Take 2 puffs first thing in am and then another 2 puffs about 12 hours later.  Increase ziac to one twice daily  Plan B = Backup Only use your albuterol inhaler as a rescue medication Plan C = Crisis - only use your albuterol nebulizer if you first try Plan B and it fails to help > ok to use the nebulizer up to every 4 hours but if start needing it regularly call for immediate appointment Please schedule a follow up visit in 3 months but call sooner if needed     12/29/2018  f/u ov/Paighton Godette re: GOLD II/ bevespi Chief Complaint  Patient presents with   Follow-up    Breathing is some better since the last visit. She is using her albuterol inhaler about 2 x per day.   Dyspnea:  Limited by back / MMRC2 = can't walk a nl pace on a flat grade s sob but does fine slow and flat   Cough: minimal / none noct  Sleeping: ok flat/ 1-2 pillows  SABA use: as above  02: none  Nausea and abd pain w/u in UC dx diverticulitis some better p cipro/flagyl but still needing phenergan and pain only 50% better > has not had f/u  rec Start clonidine 0.1 mg twice daily  - can make mouth dry but should help your back pain (use jolly ranchers)  Make appt to see Dr Regis Bill about your abdominal pain     06/29/2019  f/u ov/Markavious Micco re: GOLD II / bevespi  Both shots for covid 42 completed Chief Complaint   Patient presents with   Follow-up    6 month f/u for COPD. States her breathing has gotten worse over the past 2 weeks. Increased  SOB with exertion.   Dyspnea:  At onset of spring weather noted gradual reduction in ex tol, no better with nebs /assoc with subj wheeze Cough: none/ sneezing water eyes some pnds but no real cough Sleeping: feels choking on back x years, better on side/on 2 big pillows  SABA use: no better with inhaler 02: says sats drops  when walking into 80s (stopped prior to desats with walking study on day of ov)   Assoc overt HB rec Prednisone 10 mg take  4 each am x 2 days,   2 each am x 2 days,  1 each am x 2 days and stop  Change bevespi to brezri Take 2 puffs first thing in am and then another 2 puffs about 12 hours later.  Work on inhaler technique   11/14/2020  f/u ov/Guliana Weyandt re: copd gold II / breztri and rarely saba Chief Complaint  Patient presents with   Follow-up    Doing well  Dyspnea:  HT does fine leaning on cart whole store  Cough: none  Sleeping: chokes if flat on back/ better on side  SABA use: none  02: none  Covid status:   X 3  Rec Plan A = Automatic = Always=   Breztri Take 2 puffs first thing in am and then another 2 puffs about 12 hours later.  Plan B = Backup (to supplement plan A, not to replace it) Only use your albuterol inhaler as a rescue medication to be used if you can't catch your breath Plan C = Crisis (instead of Plan B but only if Plan B stops working) - only use your albuterol nebulizer if you first try Plan B and it fails to help > ok to use the nebulizer up to every 4 hours but if start needing it regularly call for immediate appointment Plan D = Deltasone  If getting to plan C and not doing better  >  Prednisone 10 mg take  4 each am x 2 days,   2 each am x 2 days,  1 each am x 2 days and stop  Please schedule a follow up visit in  6  months but call sooner if needed    11/14/2020  f/u ov/Idamae Coccia re: GOLD 2 COPD    maint on  breztri and one course of pred since rx   Chief Complaint  Patient presents with   Follow-up    Doing well   Dyspnea:  better, uses [employer] parking , ok wm shopping, not doing any housekeeping One flight of steps stops half was  Cough: none  Sleeping: 30 degrees with flat bed/ pilllows, better overall SABA use: occ hfa p ex never pretreats  02: none Covid status:   x  3    No obvious day to day or daytime variability or assoc excess/ purulent sputum or mucus plugs or hemoptysis or cp or chest tightness, subjective wheeze or overt sinus or hb symptoms.   Sleeping ok  without nocturnal  or early am exacerbation  of respiratory  c/o's or need for noct saba. Also denies any obvious fluctuation of symptoms with weather or environmental changes or other aggravating or alleviating factors except as outlined above   No unusual exposure hx or h/o childhood pna/ asthma or knowledge of premature birth.  Current Allergies, Complete Past Medical History, Past Surgical History, Family History, and Social History were reviewed in [employer] record.  ROS  The following are not active complaints unless bolded  Hoarseness, sore throat, dysphagia, dental problems, itching, sneezing,  nasal congestion or discharge of excess mucus or purulent secretions, ear ache,   fever, chills, sweats, unintended wt loss or wt gain, classically pleuritic or exertional cp,  orthopnea pnd or arm/hand swelling  or leg swelling, presyncope, palpitations, abdominal pain, anorexia, nausea, vomiting, diarrhea  or change in bowel habits or change in bladder habits, change in stools or change in urine, dysuria, hematuria,  rash, arthralgias, visual complaints, headache, numbness, weakness or ataxia or problems with walking or coordination,  change in mood or  memory.        Current Meds  Medication Sig   albuterol (PROAIR HFA) 108 (90 Base) MCG/ACT inhaler 2 puffs every 4 hours as needed only  if your can't catch  your breath   albuterol (PROVENTIL) (2.5 MG/3ML) 0.083% nebulizer solution Take 3 mLs (2.5 mg total) by nebulization every 6 (six) hours as needed for wheezing or shortness of breath.   allopurinol (ZYLOPRIM) 100 MG tablet TAKE ONE TABLET BY MOUTH TWICE DAILY   bisoprolol-hydrochlorothiazide (ZIAC) 5-6.25 MG tablet Take 1 tablet by mouth 2 (two) times daily.   BREZTRI AEROSPHERE 160-9-4.8 MCG/ACT AERO INHALE TWO PUFFS BY MOUTH INTO LUNGS TWICE DAILY   cloNIDine (CATAPRES) 0.1 MG tablet Take 1 tablet (0.1 mg total) by mouth 2 (two) times daily.   Colchicine 0.6 MG CAPS Take 0.6 mg by mouth 2 (two) times daily as needed.   gabapentin (NEURONTIN) 100 MG capsule TAKE ONE TABLET BY MOUTH TWICE DAILY   loratadine (CLARITIN) 10 MG tablet Take 10 mg by mouth daily.   omeprazole (PRILOSEC) 40 MG capsule TAKE ONE CAPSULE BY MOUTH TWICE DAILY   ondansetron (ZOFRAN ODT) 4 MG disintegrating tablet Take 1 tablet (4 mg total) by mouth every 8 (eight) hours as needed for nausea or vomiting.   polyethylene glycol (MIRALAX / GLYCOLAX) 17 g packet Take 17 g by mouth 2 (two) times daily. Until stooling regularly   predniSONE (DELTASONE) 10 MG tablet Take  4 each am x 2 days,   2 each am x 2 days,  1 each am x 2 days and stop   rosuvastatin (CRESTOR) 10 MG tablet Take 1 tablet (10 mg total) by mouth daily. For high cholesterol,avoid taking with cochicine   Semaglutide, 1 MG/DOSE, 4 MG/3ML SOPN Inject 1 mg as directed once a week.   venlafaxine XR (EFFEXOR XR) 75 MG 24 hr capsule Take 1 capsule (75 mg total) by mouth daily with breakfast.   Vitamin D, Ergocalciferol, (DRISDOL) 1.25 MG (50000 UNIT) CAPS capsule Take 1 capsule (50,000 Units total) by mouth every 7 (seven) days.                              Objective:   Physical Exam   11/14/2020   204  11/14/2020   232 06/29/2019  224  12/29/2018  226  09/10/2018  227  08/11/2018  226  05/08/2012  Wt 195  > 199 06/12/2012 > 201 08/01/2012 > 09/23/2012  204 >  06/19/2013  199 > 07/01/2013  195 > 08/13/2014 183     04/10/12 194 lb 9.6 oz (88.27 kg)  02/20/12 190 lb 12.8 oz (86.546 kg)     Vital signs reviewed  11/14/2020  - Note at rest 02 sats  95% on RA   General appearance:    somber amb wf nad  Reports no uppper/ 4 lower teeth  HEENT :  pt wearing mask not removed for exam due to covid -19 concerns.    NECK :  without JVD/Nodes/TM/ nl carotid upstrokes bilaterally   LUNGS: no acc muscle use,  Mod barrel  contour chest wall with bilateral  Distant bs s audible wheeze and  without cough on insp or exp maneuvers and mod  Hyperresonant  to  percussion bilaterally     CV:  RRR  no s3 or murmur or increase in P2, and no edema   ABD:  soft and nontender with pos mid insp Hoover's  in the supine position. No bruits or organomegaly appreciated, bowel sounds nl  MS:     ext warm without deformities, calf tenderness, cyanosis or clubbing No obvious joint restrictions   SKIN: warm and dry without lesions    NEURO:  alert, approp, nl sensorium with  no motor or cerebellar deficits apparent.               Assessment & Plan:

## 2020-11-14 NOTE — Patient Instructions (Addendum)
Flu shot today and check with your pharmacy for the new covid shot that covers omicron variant   Ok to try albuterol 15 min before an activity (on alternating days)  that you know would usually make you short of breath and see if it makes any difference and if makes none then don't take albuterol after activity unless you can't catch your breath as this means it's the resting that helps, not the albuterol.      Please schedule a follow up visit in 12 months but call sooner if needed

## 2020-11-15 ENCOUNTER — Ambulatory Visit (INDEPENDENT_AMBULATORY_CARE_PROVIDER_SITE_OTHER): Payer: Medicare Other | Admitting: Family Medicine

## 2020-11-15 ENCOUNTER — Encounter (INDEPENDENT_AMBULATORY_CARE_PROVIDER_SITE_OTHER): Payer: Self-pay | Admitting: Family Medicine

## 2020-11-15 ENCOUNTER — Other Ambulatory Visit (HOSPITAL_COMMUNITY): Payer: Self-pay

## 2020-11-15 ENCOUNTER — Other Ambulatory Visit: Payer: Self-pay | Admitting: Internal Medicine

## 2020-11-15 VITALS — BP 99/70 | HR 93 | Temp 97.7°F | Ht 65.0 in | Wt 201.0 lb

## 2020-11-15 DIAGNOSIS — Z6839 Body mass index (BMI) 39.0-39.9, adult: Secondary | ICD-10-CM

## 2020-11-15 DIAGNOSIS — E785 Hyperlipidemia, unspecified: Secondary | ICD-10-CM

## 2020-11-15 DIAGNOSIS — E559 Vitamin D deficiency, unspecified: Secondary | ICD-10-CM | POA: Diagnosis not present

## 2020-11-15 DIAGNOSIS — M1A9XX0 Chronic gout, unspecified, without tophus (tophi): Secondary | ICD-10-CM | POA: Diagnosis not present

## 2020-11-15 DIAGNOSIS — E1169 Type 2 diabetes mellitus with other specified complication: Secondary | ICD-10-CM | POA: Diagnosis not present

## 2020-11-15 MED ORDER — VITAMIN D (ERGOCALCIFEROL) 1.25 MG (50000 UNIT) PO CAPS
50000.0000 [IU] | ORAL_CAPSULE | ORAL | 0 refills | Status: DC
  Filled 2020-11-15: qty 4, 28d supply, fill #0

## 2020-11-15 MED ORDER — TIRZEPATIDE 5 MG/0.5ML ~~LOC~~ SOAJ
5.0000 mg | SUBCUTANEOUS | 0 refills | Status: DC
  Filled 2020-11-15: qty 6, 84d supply, fill #0

## 2020-11-15 NOTE — Progress Notes (Addendum)
Chief Complaint:   OBESITY Darsi is here to discuss her progress with her obesity treatment plan along with follow-up of her obesity related diagnoses. Beata is on the Category 3 Plan with breakfast options and states she is following her eating plan approximately 90% of the time. Lyriq states she is walking around the house.  Today's visit was #: 6 Starting weight: 230 lbs Starting date: 07/14/2020 Today's weight: 201 lbs Today's date: 11/15/2020 Total lbs lost to date: 29 Total lbs lost since last in-office visit: 10  Interim History: Miliyah was sick with a UTI for 3 days and had nausea, vomiting, and diarrhea.  Her last OV was over 30 days ago.  Plan:  For pt's blood draw, we will add on lab orders for pt's PCP Dr Regis Bill as well today. (She has pending labs ordered on pt already.)    Subjective:   1. Type 2 diabetes mellitus with other specified complication, without long-term current use of insulin (HCC) Ozempic works well but is too expensive due to pt being in the donut hole- co-pay is $237/monthly. Hyun is tolerating medication(s) well without side effects.  Medication compliance is good and patient appears to be taking it as prescribed.  Denies additional concerns or complaints   2. Hyperlipidemia associated with type 2 diabetes mellitus ([place]) Cerena has hyperlipidemia and has been trying to improve her cholesterol levels with intensive lifestyle modification including a low saturated fat diet, exercise and weight loss. She denies any chest pain, claudication or myalgias. Medication: Crestor  Lab Results  Component Value Date   ALT 35 05/09/2020   AST 58 (H) 05/09/2020   ALKPHOS 79 05/09/2020   BILITOT 1.1 05/09/2020   Lab Results  Component Value Date   CHOL 330 (H) 05/09/2020   HDL 63.00 05/09/2020   LDLCALC 116 (H) 05/24/2014   LDLDIRECT 242.0 05/09/2020   TRIG 255.0 (H) 05/09/2020   CHOLHDL 5 05/09/2020   3. Vitamin D deficiency She is  currently taking prescription vitamin D 50,000 IU each week.  Compliance appears to be good. Tolerating well. No concerns.  She denies nausea, vomiting or muscle weakness.  Lab Results  Component Value Date   VD25OH 24.6 (L) 07/14/2020   4. Chronic gout without tophus, unspecified cause, unspecified site Syreeta is prescribed allopurinol daily and colchicine prn. She is tolerating medication(s) given to her by PCP well without side effects.  Medication compliance is good and patient appears to be taking it as prescribed.  Denies additional concerns regarding this condition.      Assessment/Plan:   Orders Placed This Encounter  Procedures   Comprehensive metabolic panel   Hemoglobin A1c   Lipid panel   VITAMIN D 25 Hydroxy (Vit-D Deficiency, Fractures)   Uric acid   Uric acid    Medications Discontinued During This Encounter  Medication Reason   Semaglutide, 1 MG/DOSE, 4 MG/3ML SOPN Change in therapy   Vitamin D, Ergocalciferol, (DRISDOL) 1.25 MG (50000 UNIT) CAPS capsule Reorder     Meds ordered this encounter  Medications   Vitamin D, Ergocalciferol, (DRISDOL) 1.25 MG (50000 UNIT) CAPS capsule    Sig: Take 1 capsule by mouth every 7 days.    Dispense:  4 capsule    Refill:  0    Ov for rf   tirzepatide (MOUNJARO) 5 MG/0.5ML Pen    Sig: Inject 5 mg into the skin once a week.    Dispense:  6 mL    Refill:  0  Please have pt use mounjaro coupon     1. Type 2 diabetes mellitus with other specified complication, without long-term current use of insulin ([place]) Discontinue Ozempic. Iylah will start [facility] after long discussion had with pt on risks and benefits of this medication.  Good blood sugar control is important to decrease the likelihood of diabetic complications such as nephropathy, neuropathy, limb loss, blindness, coronary artery disease, and death. Intensive lifestyle modification including diet, exercise and weight loss are the first line of treatment for  diabetes. Check labs today.   Start- tirzepatide [facility]) 5 MG/0.5ML Pen; Inject 5 mg into the skin once a week.  Dispense: 6 mL; Refill: 0  - Hemoglobin A1c    2. Hyperlipidemia associated with type 2 diabetes mellitus ([place]) Cardiovascular risk and specific lipid/LDL goals reviewed.  We discussed several lifestyle modifications today and Tejal will continue to work on diet, exercise and weight loss efforts. Orders and follow up as documented in patient record. Check labs today.  Counseling Intensive lifestyle modifications are the first line treatment for this issue. Dietary changes: Increase soluble fiber. Decrease simple carbohydrates. Exercise changes: Moderate to vigorous-intensity aerobic activity 150 minutes per week if tolerated. Lipid-lowering medications: see documented in medical record. Check labs today.  - Comprehensive metabolic panel - Lipid panel    3. Vitamin D deficiency Low Vitamin D level contributes to fatigue and are associated with obesity, breast, and colon cancer. She agrees to continue to take prescription Vitamin D 50,000 IU every week and will follow-up for routine testing of Vitamin D, at least 2-3 times per year to avoid over-replacement. Check labs today.  Refill- Vitamin D, Ergocalciferol, (DRISDOL) 1.25 MG (50000 UNIT) CAPS capsule; Take 1 capsule by mouth every 7 days.  Dispense: 4 capsule; Refill: 0 - VITAMIN D 25 Hydroxy (Vit-D Deficiency, Fractures)     4. Chronic gout without tophus, unspecified cause, unspecified site Asymptomatic, no concerns.  Continue current treatment plan. Check labs today.  - Uric acid     5. Obesity with current BMI of 33.5  Ainhoa is currently in the action stage of change. As such, her goal is to continue with weight loss efforts. She has agreed to the Category 3 Plan with breakfast options.   Exercise goals: Older adults should follow the adult guidelines. When older adults cannot meet the adult  guidelines, they should be as physically active as their abilities and conditions will allow.   Behavioral modification strategies: increasing lean protein intake, decreasing simple carbohydrates, keeping healthy foods in the home, and planning for success.  Chalice has agreed to follow-up with our clinic in 3 weeks. She was informed of the importance of frequent follow-up visits to maximize her success with intensive lifestyle modifications for her multiple health conditions.   Vici was informed we would discuss her lab results at her next visit unless there is a critical issue that needs to be addressed sooner. Patrici agreed to keep her next visit at the agreed upon time to discuss these results. Labs for Dr. Regis Bill to review as well.   Objective:   Blood pressure 99/70, pulse 93, temperature 97.7 F (36.5 C), height 5\' 5"  (1.651 m), weight 201 lb (91.2 kg), SpO2 95 %. Body mass index is 33.45 kg/m.  General: Cooperative, alert, well developed, in no acute distress. HEENT: Conjunctivae and lids unremarkable. Cardiovascular: Regular rhythm.  Lungs: Normal work of breathing. Neurologic: No focal deficits.   Lab Results  Component Value Date   CREATININE 1.01 05/09/2020  BUN 22 05/09/2020   NA 137 05/09/2020   K 4.5 05/09/2020   CL 93 (L) 05/09/2020   CO2 33 (H) 05/09/2020   Lab Results  Component Value Date   ALT 35 05/09/2020   AST 58 (H) 05/09/2020   ALKPHOS 79 05/09/2020   BILITOT 1.1 05/09/2020   Lab Results  Component Value Date   HGBA1C 6.2 (H) 07/14/2020   HGBA1C 6.0 05/09/2020   HGBA1C 6.9 (H) 08/04/2019   Lab Results  Component Value Date   INSULIN 6.1 07/14/2020   Lab Results  Component Value Date   TSH 3.06 05/09/2020   Lab Results  Component Value Date   CHOL 330 (H) 05/09/2020   HDL 63.00 05/09/2020   LDLCALC 116 (H) 05/24/2014   LDLDIRECT 242.0 05/09/2020   TRIG 255.0 (H) 05/09/2020   CHOLHDL 5 05/09/2020   Lab Results  Component Value  Date   VD25OH 24.6 (L) 07/14/2020   Lab Results  Component Value Date   WBC 6.2 05/09/2020   HGB 13.6 05/09/2020   HCT 41.2 05/09/2020   MCV 94.1 05/09/2020   PLT 195.0 05/09/2020   Lab Results  Component Value Date   IRON 112 04/10/2019   FERRITIN 160.8 04/10/2019    Obesity Behavioral Intervention:   Approximately 15 minutes were spent on the discussion below.  ASK: We discussed the diagnosis of obesity with [name] today and Bless agreed to give [place] permission to discuss obesity behavioral modification therapy today.  ASSESS: Lachina has the diagnosis of obesity and her BMI today is 33.5. Noella is in the action stage of change.   ADVISE: Lucina was educated on the multiple health risks of obesity as well as the benefit of weight loss to improve her health. She was advised of the need for long term treatment and the importance of lifestyle modifications to improve her current health and to decrease her risk of future health problems.  AGREE: Multiple dietary modification options and treatment options were discussed and Michalle agreed to follow the recommendations documented in the above note.  ARRANGE: Reniah was educated on the importance of frequent visits to treat obesity as outlined per CMS and USPSTF guidelines and agreed to schedule her next follow up appointment today.   Attestation Statements:   Reviewed by clinician on day of visit: allergies, medications, problem list, medical history, surgical history, family history, social history, and previous encounter notes.  Coral Ceo, CMA, am acting as transcriptionist for [employer], DO.  I have reviewed the above documentation for accuracy and completeness, and I agree with the above. Marjory Sneddon, D.O.  The [place] was signed into law in 2016 which includes the topic of electronic health records.  This provides immediate access to information in MyChart.  This includes  consultation notes, operative notes, office notes, lab results and pathology reports.  If you have any questions about what you read please let us know at your next visit so we can discuss your concerns and take corrective action if need be.  We are right here with you.

## 2020-11-16 ENCOUNTER — Telehealth: Payer: Self-pay | Admitting: Internal Medicine

## 2020-11-16 LAB — COMPREHENSIVE METABOLIC PANEL
ALT: 30 IU/L (ref 0–32)
AST: 35 IU/L (ref 0–40)
Albumin/Globulin Ratio: 1.8 (ref 1.2–2.2)
Albumin: 4.4 g/dL (ref 3.8–4.8)
Alkaline Phosphatase: 78 IU/L (ref 44–121)
BUN/Creatinine Ratio: 18 (ref 12–28)
BUN: 26 mg/dL (ref 8–27)
Bilirubin Total: 0.6 mg/dL (ref 0.0–1.2)
CO2: 31 mmol/L — ABNORMAL HIGH (ref 20–29)
Calcium: 10.2 mg/dL (ref 8.7–10.3)
Chloride: 89 mmol/L — ABNORMAL LOW (ref 96–106)
Creatinine, Ser: 1.44 mg/dL — ABNORMAL HIGH (ref 0.57–1.00)
Globulin, Total: 2.5 g/dL (ref 1.5–4.5)
Glucose: 106 mg/dL — ABNORMAL HIGH (ref 70–99)
Potassium: 3.9 mmol/L (ref 3.5–5.2)
Sodium: 140 mmol/L (ref 134–144)
Total Protein: 6.9 g/dL (ref 6.0–8.5)
eGFR: 41 mL/min/{1.73_m2} — ABNORMAL LOW (ref >59)

## 2020-11-16 LAB — LIPID PANEL
Chol/HDL Ratio: 3.1 ratio (ref 0.0–4.4)
Cholesterol, Total: 121 mg/dL (ref 100–199)
HDL: 39 mg/dL — ABNORMAL LOW (ref >39)
LDL Chol Calc (NIH): 48 mg/dL (ref 0–99)
Triglycerides: 215 mg/dL — ABNORMAL HIGH (ref 0–149)
VLDL Cholesterol Cal: 34 mg/dL (ref 5–40)

## 2020-11-16 LAB — HEMOGLOBIN A1C
Est. average glucose Bld gHb Est-mCnc: 126 mg/dL
Hgb A1c MFr Bld: 6 % — ABNORMAL HIGH (ref 4.8–5.6)

## 2020-11-16 LAB — VITAMIN D 25 HYDROXY (VIT D DEFICIENCY, FRACTURES): Vit D, 25-Hydroxy: 57.1 ng/mL (ref 30.0–100.0)

## 2020-11-16 LAB — URIC ACID: Uric Acid: 5.4 mg/dL (ref 3.0–7.2)

## 2020-11-16 MED ORDER — BREZTRI AEROSPHERE 160-9-4.8 MCG/ACT IN AERO: 2.0000 | INHALATION_SPRAY | Freq: Two times a day (BID) | RESPIRATORY_TRACT | Status: DC

## 2020-11-16 NOTE — Telephone Encounter (Signed)
Called and spoke with patient. She states that she is in the donut hole. She is paying $170 a month for inhaler. States Dr. Melvyn Novas said he would give her samples. Samples put up front. Patient aware.  Nothing further needed at this time.

## 2020-11-17 ENCOUNTER — Telehealth (INDEPENDENT_AMBULATORY_CARE_PROVIDER_SITE_OTHER): Payer: Self-pay

## 2020-11-17 ENCOUNTER — Telehealth (INDEPENDENT_AMBULATORY_CARE_PROVIDER_SITE_OTHER): Payer: Self-pay | Admitting: Family Medicine

## 2020-11-17 NOTE — Telephone Encounter (Signed)
I called and left message on patient voicemail since patient mychart is not active, Mounjaro denied and to discuss with provider at next office visit. Patient has medicare so patient is not eligible for saving cards information.

## 2020-11-17 NOTE — Telephone Encounter (Signed)
FAXED [place]: [employer] (Faxed on 11/17/20) Document: Patient Assistance application, filled out, and completed in full. Other records requested: Financial documents to show that patient does qualify.  All above requested information has been faxed successfully to [employer] listed above. Documents and fax confirmation have been placed in the faxed file for future reference.

## 2020-11-18 ENCOUNTER — Other Ambulatory Visit: Payer: Self-pay | Admitting: Family Medicine

## 2020-11-18 ENCOUNTER — Other Ambulatory Visit (HOSPITAL_COMMUNITY): Payer: Self-pay

## 2020-11-18 ENCOUNTER — Other Ambulatory Visit (INDEPENDENT_AMBULATORY_CARE_PROVIDER_SITE_OTHER): Payer: Self-pay | Admitting: Family Medicine

## 2020-11-18 DIAGNOSIS — E1169 Type 2 diabetes mellitus with other specified complication: Secondary | ICD-10-CM

## 2020-11-18 NOTE — Addendum Note (Signed)
Addended by: Marjory Sneddon on: 11/18/2020 04:57 PM   Modules accepted: Level of Service

## 2020-11-21 ENCOUNTER — Telehealth: Payer: Self-pay | Admitting: Pharmacist

## 2020-11-21 NOTE — Telephone Encounter (Signed)
This patient filled out a patient application for [employer] Patient Assistance for [employer]. Since this had been previously filled out, it was faxed to the patient assistance company last week. We are currently awaiting an approval or denial from the company.

## 2020-11-21 NOTE — Chronic Care Management (AMB) (Signed)
Chronic Care Management Pharmacy Assistant   Name: Samantha Hinton  MRN: [id number] DOB: 01-Oct-1955  Reason for Encounter: Disease State and Medication Review   Conditions to be addressed/monitored: HTN and COPD.   Recent office visits:  Dborah Opalski DO (Family Medicine) - seen for type 2 diabetes mellitus with other specified complication without long term current use of insulin. Patient started on tirzepatide 5mg  weekly.discontinued semaglutide 1mg  weekly.   Recent consult visits:  11/14/20 Christinia Gully MD (Pulmonary Disease) - seen for need for immunization against influenza. No medication changes. Follow up in 12 months.   Hospital visits:  None in previous 6 months  Medications: Outpatient Encounter Medications as of 11/21/2020  Medication Sig Note   albuterol (PROAIR HFA) 108 (90 Base) MCG/ACT inhaler 2 puffs every 4 hours as needed only  if your can't catch your breath    albuterol (PROVENTIL) (2.5 MG/3ML) 0.083% nebulizer solution Take 3 mLs (2.5 mg total) by nebulization every 6 (six) hours as needed for wheezing or shortness of breath.    allopurinol (ZYLOPRIM) 100 MG tablet TAKE ONE TABLET BY MOUTH TWICE DAILY    bisoprolol-hydrochlorothiazide (ZIAC) 5-6.25 MG tablet TAKE ONE TABLET BY MOUTH TWICE DAILY    BREZTRI AEROSPHERE 160-9-4.8 MCG/ACT AERO INHALE TWO PUFFS BY MOUTH INTO LUNGS TWICE DAILY    Budeson-Glycopyrrol-Formoterol (BREZTRI AEROSPHERE) 160-9-4.8 MCG/ACT AERO Inhale 2 puffs into the lungs in the morning and at bedtime.    cloNIDine (CATAPRES) 0.1 MG tablet Take 1 tablet (0.1 mg total) by mouth 2 (two) times daily.    Colchicine 0.6 MG CAPS Take 0.6 mg by mouth 2 (two) times daily as needed. 07/14/2020: Using prn   gabapentin (NEURONTIN) 100 MG capsule TAKE ONE TABLET BY MOUTH TWICE DAILY    loratadine (CLARITIN) 10 MG tablet Take 10 mg by mouth daily.    omeprazole (PRILOSEC) 40 MG capsule TAKE ONE CAPSULE BY MOUTH TWICE DAILY    ondansetron (ZOFRAN ODT)  4 MG disintegrating tablet Take 1 tablet (4 mg total) by mouth every 8 (eight) hours as needed for nausea or vomiting.    polyethylene glycol (MIRALAX / GLYCOLAX) 17 g packet Take 17 g by mouth 2 (two) times daily. Until stooling regularly    predniSONE (DELTASONE) 10 MG tablet Take  4 each am x 2 days,   2 each am x 2 days,  1 each am x 2 days and stop    rosuvastatin (CRESTOR) 10 MG tablet Take 1 tablet (10 mg total) by mouth daily. For high cholesterol,avoid taking with cochicine    tirzepatide (MOUNJARO) 5 MG/0.5ML Pen Inject 5 mg into the skin once a week.    venlafaxine XR (EFFEXOR XR) 75 MG 24 hr capsule Take 1 capsule (75 mg total) by mouth daily with breakfast.    Vitamin D, Ergocalciferol, (DRISDOL) 1.25 MG (50000 UNIT) CAPS capsule Take 1 capsule by mouth every 7 days.    No facility-administered encounter medications on file as of 11/21/2020.   Fill History: albuterol sulfate HFA 90 mcg/actuation aerosol inhaler 08/04/2020 25   allopurinol 100 mg tablet 10/26/2020 30   bisoprolol 5 mg-hydrochlorothiazide 6.25 mg tablet 10/26/2020 30   Breztri Aerosphere 160 mcg-96mcg-4.8mcg/actuation HFA aerosol inhaler 10/26/2020 30   Vitamin D2 1,250 mcg (50,000 unit) capsule 10/25/2020 28   gabapentin 100 mg capsule 10/26/2020 30   omeprazole 40 mg capsule,delayed release 10/26/2020 30   rosuvastatin 10 mg tablet 10/26/2020 30   Ozempic 1 mg/dose (4 mg/3 mL) subcutaneous pen  injector 10/25/2020 30   venlafaxine ER 75 mg capsule,extended release 24 hr 10/26/2020 30   buspirone 10 mg tablet 05/23/2020 30   clonidine HCl 0.1 mg tablet 10/26/2020 30   Reviewed chart for medication changes ahead of medication coordination call.  No OVs, Consults, or hospital visits since last care coordination call/Pharmacist visit. (If appropriate, list visit date, provider name)  No medication changes indicated OR if recent visit, treatment plan here.  BP Readings from Last 3 Encounters:  11/15/20  99/70  11/14/20 106/68  10/10/20 131/67    Lab Results  Component Value Date   HGBA1C 6.0 (H) 11/15/2020     Patient obtains medications through Adherence Packaging  30 Days   Last adherence delivery included:  Gabapentin 100 mg - take 1 tablet in the am and 1 tablet with dinner. Gabapentin 300 mg - take 1 tablet at bedtime. Albuterol PROAIR inhaler - take 2 puffs every 4 hours as needed. Loratadine 10 mg - take daily in the morning  Venlafaxine 75 mg - take 1 tablet in the morning Omeprazole 40 mg - take 1 capsule twice daily in the morning and with dinner Rosuvastatin 10 mg - take 1 tablet with dinner Allopurinol 100 mg - take 1 tablet twice daily in the morning and with dinner Bisoprolol- HCTZ 5-6.25 MG - take 1 tablet twice daily in the morning and with dinner Breztri inhaler - 2 puffs twice daily Clonidine 0.1mg  - take 1 tablet twice daily in the morning and with dinner  Patient declined medication below last month due to PRN use.  Prednisone 10 mg - take as needed for flare ups with breathing.  Patient is due for next adherence delivery on: 11/30/20. Called patient and reviewed medications and coordinated delivery.  This delivery to include: Gabapentin 100 mg - take 1 tablet in the am and 1 tablet with dinner. Gabapentin 300 mg - take 1 tablet at bedtime. Albuterol PROAIR inhaler - take 2 puffs every 4 hours as needed. Loratadine 10 mg - take daily in the morning  Venlafaxine 75 mg - take 1 tablet in the morning Omeprazole 40 mg - take 1 capsule twice daily in the morning and with dinner Rosuvastatin 10 mg - take 1 tablet with dinner Allopurinol 100 mg - take 1 tablet twice daily in the morning and with dinner Bisoprolol- HCTZ 5-6.25 MG - take 1 tablet twice daily in the morning and with dinner Breztri inhaler - 2 puffs twice daily Clonidine 0.1mg  - take 1 tablet twice daily in the morning and with dinner  Patient will need a short fill of (med), prior to adherence  delivery. (To align with sync date or if PRN med)  Coordinated acute fill for (med) to be delivered (date).  Patient declined the following medication due to PRN USE.  Prednisone 10 mg - take as needed for flare ups with breathing.   Patient needs refills for .  Confirmed delivery date of 11/30/20, advised patient that pharmacy will contact them the morning of delivery.   Current COPD regimen:   Albuterol (PROAIR) 108 - take 2 puffs every 4 hours as needed.       Albuterol (PROVENTIL) Solution - breathing treatments as               needed.      Breztri 160-9-4.8 - inhale two puffs twice daily.     No flowsheet data found. Any recent hospitalizations or ED visits since last visit with CPP? No Reports  denies COPD symptoms, including  What recent interventions/DTPs have been made by any provider to improve breathing since last visit: Have you had exacerbation/flare-up since last visit?  What do you do when you are short of breath?    Respiratory Devices/Equipment Do you have a nebulizer? Yes Do you use a Peak Flow Meter? No Do you use a maintenance inhaler? Yes How often do you forget to use your daily inhaler?  Do you use a rescue inhaler? Yes How often do you use your rescue inhaler?   Do you use a spacer with your inhaler? No  Adherence Review: Does the patient have >5 day gap between last estimated fill date for maintenance inhaler medications?   Reviewed chart prior to disease state call. Spoke with patient regarding BP  Recent Office Vitals: BP Readings from Last 3 Encounters:  11/15/20 99/70  11/14/20 106/68  10/10/20 131/67   Pulse Readings from Last 3 Encounters:  11/15/20 93  11/14/20 97  10/10/20 81    Wt Readings from Last 3 Encounters:  11/15/20 201 lb (91.2 kg)  11/14/20 204 lb (92.5 kg)  10/10/20 211 lb (95.7 kg)     Kidney Function Lab Results  Component Value Date/Time   CREATININE 1.44 (H) 11/15/2020 09:43 AM   CREATININE 1.01 05/09/2020 10:22  AM   GFR 58.85 (L) 05/09/2020 10:22 AM   GFRNONAA >60 12/27/2014 07:57 AM   GFRAA >60 12/27/2014 07:57 AM    BMP Latest Ref Rng & Units 11/15/2020 05/09/2020 06/30/2019  Glucose 70 - 99 mg/dL 106(H) 114(H) 127(H)  BUN 8 - 27 mg/dL 26 22 14   Creatinine 0.57 - 1.00 mg/dL 1.44(H) 1.01 0.86  BUN/Creat Ratio 12 - 28 18 - -  Sodium 134 - 144 mmol/L 140 137 137  Potassium 3.5 - 5.2 mmol/L 3.9 4.5 3.9  Chloride 96 - 106 mmol/L 89(L) 93(L) 96  CO2 20 - 29 mmol/L 31(H) 33(H) 28  Calcium 8.7 - 10.3 mg/dL 10.2 9.5 9.0    Current antihypertensive regimen:  Bisoprolol-HCTZ 5-6.25 mg 1 tablet twice daily Clonidine 0.1 mg 1 tablet twice daily How often are you checking your Blood Pressure?  Current home BP readings:  What recent interventions/DTPs have been made by any provider to improve Blood Pressure control since last CPP Visit: None. Any recent hospitalizations or ED visits since last visit with CPP? No  Adherence Review: Is the patient currently on ACE/ARB medication? No Does the patient have >5 day gap between last estimated fill dates? No  Unsuccessful at reaching patient. Patient moved to step 4.  Care Gaps:  AWV - patient contacted to schedule. Foot Exam - never done Eye Exam - never done Zoster Vaccines - never done PAP Smear -  overdue since 2009 Covid-19 4th Booster - overdue since 05/08/20 Flu vaccine - due  Star Rating Drugs:  Rosuvastatin 10mg  - last filled on 09/28/20 30DS at [facility] Pharmacist Assistant [phone]

## 2020-11-22 ENCOUNTER — Telehealth (INDEPENDENT_AMBULATORY_CARE_PROVIDER_SITE_OTHER): Payer: Self-pay

## 2020-11-22 ENCOUNTER — Other Ambulatory Visit (INDEPENDENT_AMBULATORY_CARE_PROVIDER_SITE_OTHER): Payer: Self-pay

## 2020-11-22 DIAGNOSIS — E1169 Type 2 diabetes mellitus with other specified complication: Secondary | ICD-10-CM

## 2020-11-22 MED ORDER — SEMAGLUTIDE (1 MG/DOSE) 4 MG/3ML ~~LOC~~ SOPN: 1.0000 mg | PEN_INJECTOR | SUBCUTANEOUS | 0 refills | Status: DC

## 2020-11-22 NOTE — Telephone Encounter (Signed)
Pt called in and stated that her insurance will  not cover Monujaro. The pt stated she would like to go back to taking the Ozempic sent to [facility] in [place]. Please advise

## 2020-11-22 NOTE — Telephone Encounter (Signed)
Called and spoke with pt and informed her that the application for Ozempic via patient assistance was sent already and asked if she would like to wait on this process. The patient voiced her concerns with waiting, and she requested that a prescriptions be sent to the pharmacy for a one month supply. Pt stated that she would be able to get this one month supply, and hopefully sustain her until [employer] PAP approves or denies her application.  Would you like for an Rx to be sent for Ozempic 1mg  SQ QW for this pt?

## 2020-11-23 ENCOUNTER — Other Ambulatory Visit (HOSPITAL_COMMUNITY): Payer: Self-pay

## 2020-11-23 NOTE — Telephone Encounter (Signed)
Received fax confirmation from [employer] Patient Assistance Program stating that the pt has been approved for assistance. Patient has only been approved through January 18, 2021, as long as patient continues to meet program guidelines.  A four month's supply of the medication requested (Ozempic) is being shipped to this office, which should arrive within 10-14 business days. The prescribing phsycian will receive a fax reminder to reorder medication, prior to next refill.  However, on the application submitted, it was requested that the patient receive automatic refills by NOT checking the box to omit automatic refills. Checking the box on the application indicated that a refill form would be needed prior to each refill.   Pt will be notified, and MD notified as well.

## 2020-11-24 NOTE — Telephone Encounter (Signed)
Contacted pt via telephone to inform her about the patient assistance approval. Patient did not answer, but a voicemail was left informing her that her application for patient assistance was approved, and that if she would like additional information she can contact this office for further information.

## 2020-12-08 ENCOUNTER — Encounter (INDEPENDENT_AMBULATORY_CARE_PROVIDER_SITE_OTHER): Payer: Self-pay | Admitting: Family Medicine

## 2020-12-08 ENCOUNTER — Ambulatory Visit (INDEPENDENT_AMBULATORY_CARE_PROVIDER_SITE_OTHER): Payer: Medicare Other | Admitting: Family Medicine

## 2020-12-08 ENCOUNTER — Other Ambulatory Visit: Payer: Self-pay

## 2020-12-08 VITALS — BP 106/74 | HR 95 | Ht 65.0 in | Wt 196.0 lb

## 2020-12-08 DIAGNOSIS — E1159 Type 2 diabetes mellitus with other circulatory complications: Secondary | ICD-10-CM

## 2020-12-08 DIAGNOSIS — E1169 Type 2 diabetes mellitus with other specified complication: Secondary | ICD-10-CM | POA: Diagnosis not present

## 2020-12-08 DIAGNOSIS — E559 Vitamin D deficiency, unspecified: Secondary | ICD-10-CM

## 2020-12-08 DIAGNOSIS — I152 Hypertension secondary to endocrine disorders: Secondary | ICD-10-CM | POA: Diagnosis not present

## 2020-12-08 DIAGNOSIS — Z6839 Body mass index (BMI) 39.0-39.9, adult: Secondary | ICD-10-CM | POA: Diagnosis not present

## 2020-12-08 DIAGNOSIS — E785 Hyperlipidemia, unspecified: Secondary | ICD-10-CM | POA: Diagnosis not present

## 2020-12-08 DIAGNOSIS — R748 Abnormal levels of other serum enzymes: Secondary | ICD-10-CM

## 2020-12-08 DIAGNOSIS — N390 Urinary tract infection, site not specified: Secondary | ICD-10-CM

## 2020-12-08 MED ORDER — VITAMIN D (ERGOCALCIFEROL) 1.25 MG (50000 UNIT) PO CAPS: 50000.0000 [IU] | ORAL_CAPSULE | ORAL | 0 refills | Status: DC

## 2020-12-13 NOTE — Progress Notes (Signed)
Chief Complaint:   OBESITY Samantha Hinton is here to discuss her progress with her obesity treatment plan along with follow-up of her obesity related diagnoses. Samantha Hinton is on the Category 3 Plan with breakfast options and states she is following her eating plan approximately 80% of the time. Samantha Hinton states she is walking for 15 minutes 5-7 times per week.  Today's visit was #: 7 Starting weight: 230 lbs Starting date: 07/14/2020 Today's weight: 196 lbs Today's date: 12/08/2020 Total lbs lost to date: 34 Total lbs lost since last in-office visit: 5  Interim History: Samantha Hinton is here for a follow up office visit. We reviewed her meal plan and questions were answered. Patient's food recall appears to be accurate and consistent with what is on plan when she is following it. When eating on plan, her hunger and cravings are well controlled. She is here to review all recent labs as well.   Subjective:   1. Type 2 diabetes mellitus with other specified complication, without long-term current use of insulin (HCC) Samantha Hinton is not checking her BGs at home, but she has no concerns. Her A1c has improved from prior. I discussed labs with the patient today.  2. Urinary tract infection without hematuria, site unspecified Samantha Hinton was diagnosed with a urinary tract infection approximately 1 month ago. Her CMP, serum creatinine is elevated, likely secondary to recent UTI. I discussed labs with the patient today.  3. Elevated liver enzymes Samantha Hinton's liver enzymes have much improved with weight loss and she stopped drinking ETOH when starting the program. I discussed labs with the patient today.  4. Hyperlipidemia associated with type 2 diabetes mellitus (Samantha Hinton) Samantha Hinton has hyperlipidemia and she is taking Crestor. Her last LDL is at goal, and her triglycerides are improving from prior. Her HDL is low due to recent inactivity. She has been trying to improve her cholesterol levels with intensive lifestyle  modification including a low saturated fat diet, exercise and weight loss. She denies any chest pain, claudication or myalgias. I discussed labs with the patient today.  5. Hypertension associated with type 2 diabetes mellitus (Samantha Hinton) Samantha Hinton's blood pressure is controlled, and she denies dizziness or lightheadedness. I discussed labs with the patient today.  6. Vitamin D deficiency Samantha Hinton is currently taking prescription vitamin D 50,000 IU each week. Her Vit D level is at goal. She denies nausea, vomiting or muscle weakness. I discussed labs with the patient today.  Assessment/Plan:  No orders of the defined types were placed in this encounter.   Medications Discontinued During This Encounter  Medication Reason   Vitamin D, Ergocalciferol, (DRISDOL) 1.25 MG (50000 UNIT) CAPS capsule Reorder     Meds ordered this encounter  Medications   Vitamin D, Ergocalciferol, (DRISDOL) 1.25 MG (50000 UNIT) CAPS capsule    Sig: Take 1 capsule by mouth every 7 days.    Dispense:  4 capsule    Refill:  0    Ov for rf     1. Type 2 diabetes mellitus with other specified complication, without long-term current use of insulin (HCC) Samantha Hinton will continue Ozempic, and she will call a company in regards to her assistance with cost. Good blood sugar control is important to decrease the likelihood of diabetic complications such as nephropathy, neuropathy, limb loss, blindness, coronary artery disease, and death. Intensive lifestyle modification including diet, exercise and weight loss are the first line of treatment for diabetes.   2. Urinary tract infection without hematuria, site unspecified We will  repeat BMP with GFR in 1-2 months.  3. Elevated liver enzymes Samantha Hinton was educated the importance of weight loss. Samantha Hinton agreed to continue with her weight loss efforts with healthier diet and exercise as an essential part of her treatment plan.  4. Hyperlipidemia associated with type 2 diabetes mellitus  (Samantha Hinton) Cardiovascular risk and specific lipid/LDL goals reviewed. We discussed several lifestyle modifications today. Samantha Hinton will continue to work on diet, exercise and weight loss efforts. Orders and follow up as documented in patient record.   Counseling Intensive lifestyle modifications are the first line treatment for this issue. Dietary changes: Increase soluble fiber. Decrease simple carbohydrates. Exercise changes: Moderate to vigorous-intensity aerobic activity 150 minutes per week if tolerated. Lipid-lowering medications: see documented in medical record.  5. Hypertension associated with type 2 diabetes mellitus (Samantha Hinton) Samantha Hinton is to check her blood pressure at home, and notify us sooner if any issues or symptoms arise. She will continue working on healthy weight loss and exercise to improve blood pressure control. She will watch for signs of hypotension as she continues her lifestyle modifications.  6. Vitamin D deficiency Low Vitamin D level contributes to fatigue and are associated with obesity, breast, and colon cancer. We will refill prescription Vitamin D for 1 month. Samantha Hinton will follow-up for routine testing of Vitamin D, at least 2-3 times per year to avoid over-replacement.  - Vitamin D, Ergocalciferol, (DRISDOL) 1.25 MG (50000 UNIT) CAPS capsule; Take 1 capsule by mouth every 7 days.  Dispense: 4 capsule; Refill: 0  7. Obesity with current BMI of 32.7 Samantha Hinton is currently in the action stage of change. As such, her goal is to continue with weight loss efforts. She has agreed to the Category 3 Plan.   Exercise goals: As is, and increase as tolerated.  Behavioral modification strategies: decreasing simple carbohydrates and increasing water intake.  Samantha Hinton has agreed to follow-up with our clinic in 2 to 3 weeks. She was informed of the importance of frequent follow-up visits to maximize her success with intensive lifestyle modifications for her multiple health conditions.    Objective:   Blood pressure 106/74, pulse 95, height 5\' 5"  (1.651 m), weight 196 lb (88.9 kg), SpO2 96 %. Body mass index is 32.62 kg/m.  General: Cooperative, alert, well developed, in no acute distress. HEENT: Conjunctivae and lids unremarkable. Cardiovascular: Regular rhythm.  Lungs: Normal work of breathing. Neurologic: No focal deficits.   Lab Results  Component Value Date   CREATININE 1.44 (H) 11/15/2020   BUN 26 11/15/2020   NA 140 11/15/2020   K 3.9 11/15/2020   CL 89 (L) 11/15/2020   CO2 31 (H) 11/15/2020   Lab Results  Component Value Date   ALT 30 11/15/2020   AST 35 11/15/2020   ALKPHOS 78 11/15/2020   BILITOT 0.6 11/15/2020   Lab Results  Component Value Date   HGBA1C 6.0 (H) 11/15/2020   HGBA1C 6.2 (H) 07/14/2020   HGBA1C 6.0 05/09/2020   HGBA1C 6.9 (H) 08/04/2019   Lab Results  Component Value Date   INSULIN 6.1 07/14/2020   Lab Results  Component Value Date   TSH 3.06 05/09/2020   Lab Results  Component Value Date   CHOL 121 11/15/2020   HDL 39 (L) 11/15/2020   LDLCALC 48 11/15/2020   LDLDIRECT 242.0 05/09/2020   TRIG 215 (H) 11/15/2020   CHOLHDL 3.1 11/15/2020   Lab Results  Component Value Date   VD25OH 57.1 11/15/2020   VD25OH 24.6 (L) 07/14/2020   Lab  Results  Component Value Date   WBC 6.2 05/09/2020   HGB 13.6 05/09/2020   HCT 41.2 05/09/2020   MCV 94.1 05/09/2020   PLT 195.0 05/09/2020   Lab Results  Component Value Date   IRON 112 04/10/2019   FERRITIN 160.8 04/10/2019    Obesity Behavioral Intervention:   Approximately 15 minutes were spent on the discussion below.  ASK: We discussed the diagnosis of obesity with Samantha Hinton today and Samantha Hinton agreed to give [place] permission to discuss obesity behavioral modification therapy today.  ASSESS: Samantha Hinton has the diagnosis of obesity and her BMI today is 32.7. Samantha Hinton is in the action stage of change.   ADVISE: Samantha Hinton was educated on the multiple health risks of obesity  as well as the benefit of weight loss to improve her health. She was advised of the need for long term treatment and the importance of lifestyle modifications to improve her current health and to decrease her risk of future health problems.  AGREE: Multiple dietary modification options and treatment options were discussed and Samantha Hinton agreed to follow the recommendations documented in the above note.  ARRANGE: Samantha Hinton was educated on the importance of frequent visits to treat obesity as outlined per CMS and USPSTF guidelines and agreed to schedule her next follow up appointment today.  Attestation Statements:   Reviewed by clinician on day of visit: allergies, medications, problem list, medical history, surgical history, family history, social history, and previous encounter notes.   Wilhemena Durie, am acting as transcriptionist for [employer], DO.  I have reviewed the above documentation for accuracy and completeness, and I agree with the above. Marjory Sneddon, D.O.  The [place] was signed into law in 2016 which includes the topic of electronic health records.  This provides immediate access to information in MyChart.  This includes consultation notes, operative notes, office notes, lab results and pathology reports.  If you have any questions about what you read please let us know at your next visit so we can discuss your concerns and take corrective action if need be.  We are right here with you.

## 2020-12-15 ENCOUNTER — Telehealth: Payer: Self-pay | Admitting: Pharmacist

## 2020-12-15 NOTE — Telephone Encounter (Signed)
A shipment of medication was received by the [employer] for pt. Inside shipment was two boxes of Ozempic 2mg /1.79mL prefilled syringes. Each box contains 1 pen of medication.  Pt was contacted and informed that this medication was available for pick up, that it should be picked up today, and pt verbalized understanding of this. Medication will be kept in insulated and cooled shipping box it was received it until pt arrives.  MD also made aware.   Lot #: [id number] Expiration Date: 07/20/2023 .[employer] #: [id number]

## 2020-12-15 NOTE — Chronic Care Management (AMB) (Addendum)
Chronic Care Management Pharmacy Assistant   Name: Samantha Hinton  MRN: [id number] DOB: 1955/10/26  Reason for Encounter: Medication Review/ Medication Coordination Call and PAP follow up on Ozempic.     Recent office visits:  None.  Recent consult visits:  12/08/20 [name] DO [facility] weight management clinic) - seen for type 2 diabetes and other chronic conditions. No medication changes. Follow up in 2-3 weeks.   Hospital visits:  None in previous 6 months  Medications: Outpatient Encounter Medications as of 12/15/2020  Medication Sig Note   albuterol (PROAIR HFA) 108 (90 Base) MCG/ACT inhaler 2 puffs every 4 hours as needed only  if your can't catch your breath    albuterol (PROVENTIL) (2.5 MG/3ML) 0.083% nebulizer solution Take 3 mLs (2.5 mg total) by nebulization every 6 (six) hours as needed for wheezing or shortness of breath.    allopurinol (ZYLOPRIM) 100 MG tablet TAKE ONE TABLET BY MOUTH TWICE DAILY    bisoprolol-hydrochlorothiazide (ZIAC) 5-6.25 MG tablet TAKE ONE TABLET BY MOUTH TWICE DAILY    BREZTRI AEROSPHERE 160-9-4.8 MCG/ACT AERO INHALE TWO PUFFS BY MOUTH INTO LUNGS TWICE DAILY    Budeson-Glycopyrrol-Formoterol (BREZTRI AEROSPHERE) 160-9-4.8 MCG/ACT AERO Inhale 2 puffs into the lungs in the morning and at bedtime.    cloNIDine (CATAPRES) 0.1 MG tablet Take 1 tablet (0.1 mg total) by mouth 2 (two) times daily.    Colchicine 0.6 MG CAPS Take 0.6 mg by mouth 2 (two) times daily as needed. 07/14/2020: Using prn   loratadine (CLARITIN) 10 MG tablet Take 10 mg by mouth daily.    omeprazole (PRILOSEC) 40 MG capsule TAKE ONE CAPSULE BY MOUTH TWICE DAILY    ondansetron (ZOFRAN ODT) 4 MG disintegrating tablet Take 1 tablet (4 mg total) by mouth every 8 (eight) hours as needed for nausea or vomiting.    polyethylene glycol (MIRALAX / GLYCOLAX) 17 g packet Take 17 g by mouth 2 (two) times daily. Until stooling regularly    predniSONE (DELTASONE) 10 MG tablet Take  4  each am x 2 days,   2 each am x 2 days,  1 each am x 2 days and stop    rosuvastatin (CRESTOR) 10 MG tablet Take 1 tablet (10 mg total) by mouth daily. For high cholesterol,avoid taking with cochicine    Semaglutide, 1 MG/DOSE, 4 MG/3ML SOPN Inject 1 mg as directed once a week.    venlafaxine XR (EFFEXOR XR) 75 MG 24 hr capsule Take 1 capsule (75 mg total) by mouth daily with breakfast.    Vitamin D, Ergocalciferol, (DRISDOL) 1.25 MG (50000 UNIT) CAPS capsule Take 1 capsule by mouth every 7 days.    [DISCONTINUED] gabapentin (NEURONTIN) 100 MG capsule TAKE ONE TABLET BY MOUTH TWICE DAILY    No facility-administered encounter medications on file as of 12/15/2020.   Fill History: allopurinol 100 mg tablet 11/23/2020 30   bisoprolol 5 mg-hydrochlorothiazide 6.25 mg tablet 11/23/2020 30   Breztri Aerosphere 160 mcg-73mcg-4.8mcg/actuation HFA aerosol inhaler 10/26/2020 30   Vitamin D2 1,250 mcg (50,000 unit) capsule 12/09/2020 28   gabapentin 100 mg capsule 11/23/2020 30   omeprazole 40 mg capsule,delayed release 11/23/2020 30   rosuvastatin 10 mg tablet 11/23/2020 30   Ozempic 1 mg/dose (4 mg/3 mL) subcutaneous pen injector 11/23/2020 30   venlafaxine ER 75 mg capsule,extended release 24 hr 11/23/2020 30   clonidine HCl 0.1 mg tablet 11/23/2020 30   Reviewed chart for medication changes ahead of medication coordination call.  BP Readings from Last 3 Encounters:  12/08/20 106/74  11/15/20 99/70  11/14/20 106/68    Lab Results  Component Value Date   HGBA1C 6.0 (H) 11/15/2020     Patient obtains medications through Adherence Packaging  30 Days   Last adherence delivery included:  Gabapentin 100 mg - take 1 tablet in the am and 1 tablet with dinner. Gabapentin 300 mg - take 1 tablet at bedtime. Albuterol PROAIR inhaler - take 2 puffs every 4 hours as needed. Loratadine 10 mg - take daily in the morning  Venlafaxine 75 mg - take 1 tablet in the morning Omeprazole 40 mg -  take 1 capsule twice daily in the morning and with dinner Rosuvastatin 10 mg - take 1 tablet with dinner Allopurinol 100 mg - take 1 tablet twice daily in the morning and with dinner Bisoprolol- HCTZ 5-6.25 MG - take 1 tablet twice daily in the morning and with dinner Breztri inhaler - 2 puffs twice daily Clonidine 0.1mg  - take 1 tablet twice daily in the morning and with dinner  Patient declined medication below last month due to PRN use.  Prednisone 10 mg - take as needed for flare ups with breathing.   Patient is due for next adherence delivery on: 12/29/20. Called patient and reviewed medications and coordinated delivery.  This delivery to include: Gabapentin 100 mg - take 1 tablet in the am and 1 tablet with dinner. Gabapentin 300 mg - take 1 tablet at bedtime. Albuterol PROAIR inhaler - take 2 puffs every 4 hours as needed. Loratadine 10 mg - take daily in the morning  Venlafaxine 75 mg - take 1 tablet in the morning Omeprazole 40 mg - take 1 capsule twice daily in the morning and with dinner Rosuvastatin 10 mg - take 1 tablet with dinner Allopurinol 100 mg - take 1 tablet twice daily in the morning and with dinner Bisoprolol- HCTZ 5-6.25 MG - take 1 tablet twice daily in the morning and with dinner Vitamin D 50,000 units - take 1 tablet once weekly Clonidine 0.1mg  - take 1 tablet twice daily in the morning and with dinner  Patient declined the following medications (meds) due to (reason) Breztri inhaler - 2 puffs twice daily  Prednisone 10 mg - take as needed for flare ups with breathing.  Confirmed delivery date of 12/29/20, advised patient that pharmacy will contact them the morning of delivery.   Notes: Patients assistance with [employer]O NORDISK approved through 01/18/21.  Patient informed that she will need to renew her ozempic patient assistance next month. She stated that her prescribing physician office is aware and will be handling the renewal process.   Care  Gaps:  AWV - patient contacted to schedule. Foot Exam - never done Eye Exam - never done Zoster Vaccines - never done PAP Smear -  overdue since 2009 Covid-19 4th Booster - overdue since 05/08/20 Flu vaccine - due  Star Rating Drugs:  Rosuvastatin 10mg  - last filled on 11/23/20 30DS at [facility] [phone]

## 2020-12-16 ENCOUNTER — Other Ambulatory Visit: Payer: Self-pay | Admitting: Family Medicine

## 2020-12-16 ENCOUNTER — Other Ambulatory Visit: Payer: Self-pay | Admitting: Internal Medicine

## 2020-12-16 ENCOUNTER — Other Ambulatory Visit (INDEPENDENT_AMBULATORY_CARE_PROVIDER_SITE_OTHER): Payer: Self-pay | Admitting: Family Medicine

## 2020-12-16 DIAGNOSIS — E1169 Type 2 diabetes mellitus with other specified complication: Secondary | ICD-10-CM

## 2020-12-19 NOTE — Telephone Encounter (Signed)
LATE ENTRY for 12/15/20:  Pt picked up medication at around 12pm.

## 2020-12-19 NOTE — Telephone Encounter (Signed)
Dr. O

## 2020-12-20 ENCOUNTER — Ambulatory Visit: Payer: Self-pay

## 2020-12-21 ENCOUNTER — Ambulatory Visit (INDEPENDENT_AMBULATORY_CARE_PROVIDER_SITE_OTHER): Payer: Medicare Other

## 2020-12-21 ENCOUNTER — Other Ambulatory Visit: Payer: Self-pay

## 2020-12-21 DIAGNOSIS — E2839 Other primary ovarian failure: Secondary | ICD-10-CM

## 2020-12-21 DIAGNOSIS — Z0000 Encounter for general adult medical examination without abnormal findings: Secondary | ICD-10-CM | POA: Diagnosis not present

## 2020-12-21 DIAGNOSIS — Z1231 Encounter for screening mammogram for malignant neoplasm of breast: Secondary | ICD-10-CM | POA: Diagnosis not present

## 2020-12-21 NOTE — Progress Notes (Addendum)
Virtual Visit via Telephone Note  I connected with  Samantha Hinton on 12/21/20 at  8:00 AM EDT by telephone and verified that I am speaking with the correct person using two identifiers.  Medicare Annual Wellness visit completed telephonically due to Covid-19 pandemic.   Persons participating in this call: This Health Coach and this patient.   Location: Patient: Home Provider: Office   I discussed the limitations, risks, security and privacy concerns of performing an evaluation and management service by telephone and the availability of in person appointments. The patient expressed understanding and agreed to proceed.  Unable to perform video visit due to video visit attempted and failed and/or patient does not have video capability.   Some vital signs may be absent or patient reported.   Willette Brace, LPN   Subjective:   Samantha Hinton is a 65 y.o. female who presents for Medicare Annual (Subsequent) preventive examination.  Review of Systems     Cardiac Risk Factors include: advanced age (>90men, >17 women);hypertension;diabetes mellitus;dyslipidemia;obesity (BMI >30kg/m2)     Objective:    There were no vitals filed for this visit. There is no height or weight on file to calculate BMI.  Advanced Directives 12/21/2020 07/06/2015 02/14/2015 12/27/2014 02/11/2014 02/17/2012  Does Patient Have a Medical Advance Directive? No No No No No Patient does not have advance directive;Patient would not like information  Would patient like information on creating a medical advance directive? No - Patient declined No - patient declined information - No - patient declined information No - patient declined information -  Pre-existing out of facility DNR order (yellow form or pink MOST form) - - - - - No    Current Medications (verified) Outpatient Encounter Medications as of 12/21/2020  Medication Sig   albuterol (PROAIR HFA) 108 (90 Base) MCG/ACT inhaler 2 puffs every 4 hours as  needed only  if your can't catch your breath   albuterol (PROVENTIL) (2.5 MG/3ML) 0.083% nebulizer solution Take 3 mLs (2.5 mg total) by nebulization every 6 (six) hours as needed for wheezing or shortness of breath.   allopurinol (ZYLOPRIM) 100 MG tablet TAKE ONE TABLET BY MOUTH TWICE DAILY   bisoprolol-hydrochlorothiazide (ZIAC) 5-6.25 MG tablet TAKE ONE TABLET BY MOUTH TWICE DAILY   BREZTRI AEROSPHERE 160-9-4.8 MCG/ACT AERO INHALE TWO PUFFS BY MOUTH INTO LUNGS TWICE DAILY   Budeson-Glycopyrrol-Formoterol (BREZTRI AEROSPHERE) 160-9-4.8 MCG/ACT AERO Inhale 2 puffs into the lungs in the morning and at bedtime.   cloNIDine (CATAPRES) 0.1 MG tablet Take 1 tablet (0.1 mg total) by mouth 2 (two) times daily.   Colchicine 0.6 MG CAPS Take 0.6 mg by mouth 2 (two) times daily as needed.   gabapentin (NEURONTIN) 100 MG capsule TAKE ONE TABLET BY MOUTH TWICE DAILY   loratadine (CLARITIN) 10 MG tablet Take 10 mg by mouth daily.   omeprazole (PRILOSEC) 40 MG capsule TAKE ONE CAPSULE BY MOUTH TWICE DAILY   ondansetron (ZOFRAN ODT) 4 MG disintegrating tablet Take 1 tablet (4 mg total) by mouth every 8 (eight) hours as needed for nausea or vomiting.   polyethylene glycol (MIRALAX / GLYCOLAX) 17 g packet Take 17 g by mouth 2 (two) times daily. Until stooling regularly   rosuvastatin (CRESTOR) 10 MG tablet TAKE ONE TABLET BY MOUTH EVERY EVENING   Semaglutide, 1 MG/DOSE, 4 MG/3ML SOPN Inject 1 mg as directed once a week.   venlafaxine XR (EFFEXOR XR) 75 MG 24 hr capsule Take 1 capsule (75 mg total) by mouth daily with  breakfast.   Vitamin D, Ergocalciferol, (DRISDOL) 1.25 MG (50000 UNIT) CAPS capsule Take 1 capsule by mouth every 7 days.   predniSONE (DELTASONE) 10 MG tablet Take  4 each am x 2 days,   2 each am x 2 days,  1 each am x 2 days and stop (Patient not taking: Reported on 12/21/2020)   No facility-administered encounter medications on file as of 12/21/2020.    Allergies (verified) Wellbutrin  [bupropion]   History: Past Medical History:  Diagnosis Date   Anxiety    Asthma    Chronic back pain    buldging disc and tumor.Spondylolisthesis   COPD (chronic obstructive pulmonary disease) (HCC)    Symbicort daily and ProAir as needed   Depression    Emphysema    Emphysema of lung ([place])    Fatty liver 04/13/2019   Fatty liver    Fibromyalgia    Gallbladder problem    Genital warts    GERD (gastroesophageal reflux disease)    takes Omeprazole daily   Heartburn    History of bronchitis 01/2014   History of colon polyps    Hyperlipidemia    has been off of meds d/t getting sick from them.Not been addressed again.    Hypertension    takes Ziac daily   Nocturia    Pneumonia    hx of-8+yrs ago   PONV (postoperative nausea and vomiting)    Shortness of breath dyspnea    thinks d/t pain meds and notices with exertion   SOBOE (shortness of breath on exertion)    Stroke ([place])    16 yrs ago--left eye    Swallowing difficulty    Past Surgical History:  Procedure Laterality Date   ABDOMINAL HYSTERECTOMY  1983   partial    ADENOIDECTOMY     ANTERIOR (CYSTOCELE) AND POSTERIOR REPAIR (RECTOCELE) WITH XENFORM GRAFT AND SACROSPINOUS FIXATION     APPENDECTOMY  1967   BACK SURGERY  2017   lower back   BREAST SURGERY Right    diseased milk glands   CHOLECYSTECTOMY  1983   COLONOSCOPY  04/13/2019   DIAGNOSTIC LAPAROSCOPY     cyst removed from ovaries    ESOPHAGOGASTRODUODENOSCOPY     lypoma  2011   NECK SURGERY  [phone]   c spine ant and post hx fusion and removal or harcdware and shavings   POLYPECTOMY     rods and screws in lumbar     TONSILLECTOMY     UPPER GASTROINTESTINAL ENDOSCOPY  04/13/2019   Family History  Problem Relation Age of Onset   CAD Father        tumor between heart and lung    Hypertension Father    Cancer Father    CAD Sister        ? dx    Hypertension Mother    Lung cancer Mother        died 36    Diabetes Maternal Grandmother         late 53s    Colon cancer Neg Hx    Colon polyps Neg Hx    Esophageal cancer Neg Hx    Rectal cancer Neg Hx    Stomach cancer Neg Hx    Social History   Socioeconomic History   Marital status: Significant Other    Spouse name: [name]   Number of children: 2   Years of education: Not on file   Highest education level: Not on file  Occupational History  Occupation: retired    Fish farm manager: Scientist, research (physical sciences)  Tobacco Use   Smoking status: Former    Packs/day: 0.25    Years: 44.00    Pack years: 11.00    Types: Cigarettes    Quit date: 02/04/2014    Years since quitting: 6.8   Smokeless tobacco: Never   Tobacco comments:    quit smoking in Dec 2015  Vaping Use   Vaping Use: Former  Substance and Sexual Activity   Alcohol use: Yes    Alcohol/week: 4.0 standard drinks    Types: 4 Standard drinks or equivalent per week    Comment: couple times a week   Drug use: No   Sexual activity: Not Currently    Birth control/protection: Surgical    Comment: 1st intercourse 65 yo-Fewer than 5 partners  Other Topics Concern   Not on file  Social History Narrative   6-8 hours of sleep per night   Lives with her fiance hh of 2 no pets    1 dog in the home   Retired no ets  But tob 8 per day   etoh ocass 1-2    On disability from her neck surgery predicaments .   orig from [employer] in [place] area.    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 2   Last Pap 2006 last mammogram 2000 and   Has dentures   FA    Social Determinants of Health   Financial Resource Strain: Medium Risk   Difficulty of Paying Living Expenses: Somewhat hard  Food Insecurity: No Food Insecurity   Worried About Charity fundraiser in the Last Year: Never true   Ran Out of Food in the Last Year: Never true  Transportation Needs: No Transportation Needs   Lack of Transportation (Medical): No   Lack of Transportation (Non-Medical): No  Physical Activity: Inactive   Days of Exercise per  Week: 0 days   Minutes of Exercise per Session: 0 min  Stress: No Stress Concern Present   Feeling of Stress : Only a little  Social Connections: Socially Isolated   Frequency of Communication with Friends and Family: Twice a week   Frequency of Social Gatherings with Friends and Family: Three times a week   Attends Religious Services: Never   Active Member of Clubs or Organizations: No   Attends Archivist Meetings: Never   Marital Status: Divorced    Tobacco Counseling Counseling given: Not Answered Tobacco comments: quit smoking in Dec 2015   Clinical Intake:  Pre-visit preparation completed: Yes  Pain : No/denies pain     BMI - recorded: 32.62 Nutritional Status: BMI > 30  Obese Nutritional Risks: None Diabetes: Yes CBG done?: No Did pt. bring in CBG monitor from home?: No  How often do you need to have someone help you when you read instructions, pamphlets, or other written materials from your doctor or pharmacy?: 1 - Never  Diabetic? Nutrition Risk Assessment:  Has the patient had any N/V/D within the last 2 months?  No  Does the patient have any non-healing wounds?  No  Has the patient had any unintentional weight loss or weight gain?  No   Diabetes:  Is the patient diabetic?  Yes  If diabetic, was a CBG obtained today?  No  Did the patient bring in their glucometer from home?  No  How often do you monitor your CBG's? N/A.   Financial Strains and Diabetes Management:  Are you having any financial strains with the device, your supplies or your medication? No .  Does the patient want to be seen by Chronic Care Management for management of their diabetes?  No  Would the patient like to be referred to a Nutritionist or for Diabetic Management?  No   Diabetic Exams:  Diabetic Eye Exam: Overdue for diabetic eye exam. Pt has been advised about the importance in completing this exam. Patient advised to call and schedule an eye exam. Diabetic Foot  Exam: Overdue, Pt has been advised about the importance in completing this exam. Pt is scheduled for diabetic foot exam on next appt.   Interpreter Needed?: No  Information entered by :: Charlott Rakes, LPN   Activities of Daily Living In your present state of health, do you have any difficulty performing the following activities: 12/21/2020  Hearing? N  Vision? N  Difficulty concentrating or making decisions? N  Walking or climbing stairs? N  Dressing or bathing? N  Doing errands, shopping? N  Preparing Food and eating ? N  Using the Toilet? N  In the past six months, have you accidently leaked urine? Y  Comment wears pads for incontient episodes  Do you have problems with loss of bowel control? N  Managing your Medications? N  Managing your Finances? N  Housekeeping or managing your Housekeeping? N  Some recent data might be hidden    Patient Care Team: Panosh, Standley Brooking, MD as PCP - General (Internal Medicine) Tanda Rockers, MD as Consulting Physician (Pulmonary Disease) Karie Chimera, MD as Consulting Physician (Neurosurgery) Viona Gilmore, [facility] as Pharmacist (Pharmacist) Thornton Park, MD as Consulting Physician (Gastroenterology)  Indicate any recent Medical Services you may have received from other than [facility] providers in the past year (date may be approximate).     Assessment:   This is a routine wellness examination for [place].  Hearing/Vision screen Hearing Screening - Comments:: Pt denies any hearing issues Vision Screening - Comments:: Pt follows up with [facility] for annual eye exams   Dietary issues and exercise activities discussed: Current Exercise Habits: The patient does not participate in regular exercise at present   Goals Addressed             This Visit's Progress    Patient Stated       Lose weight       Depression Screen PHQ 2/9 Scores 12/21/2020 07/14/2020 08/12/2017 07/25/2016 07/06/2015 05/24/2014  PHQ - 2 Score 1 5 0 0 0 0  PHQ-  9 Score - 16 - - - -    Fall Risk Fall Risk  12/21/2020 08/12/2017 07/25/2016 07/06/2015 05/24/2014  Falls in the past year? 1 No No No No  Number falls in past yr: 1 - - - -  Injury with Fall? 0 - - - -  Risk for fall due to : Impaired vision;Impaired balance/gait - - - -  Follow up Falls prevention discussed - - - -    FALL RISK PREVENTION PERTAINING TO THE HOME:  Any stairs in or around the home? Yes  If so, are there any without handrails? No  Home free of loose throw rugs in walkways, pet beds, electrical cords, etc? Yes  Adequate lighting in your home to reduce risk of falls? Yes   ASSISTIVE DEVICES UTILIZED TO PREVENT FALLS:  Life alert? No  Use of a cane, walker or w/c? No  Grab bars in the bathroom? No  Shower chair or bench in shower?  No  Elevated toilet seat or a handicapped toilet? No   TIMED UP AND GO:  Was the test performed? No .  Cognitive Function:     6CIT Screen 12/21/2020  What Year? 0 points  What month? 0 points  What time? 0 points  Count back from 20 0 points  Months in reverse 0 points  Repeat phrase 0 points  Total Score 0    Immunizations Immunization History  Administered Date(s) Administered   Fluad Quad(high Dose 65+) 11/14/2020   Influenza Split 03/19/2012   Influenza,inj,Quad PF,6+ Mos 12/01/2013, 10/19/2014, 03/13/2016, 05/03/2017, 10/14/2017   PFIZER(Purple Top)SARS-COV-2 Vaccination 05/14/2019, 06/04/2019, 02/08/2020   Pneumococcal Conjugate-13 07/16/2013   Pneumococcal Polysaccharide-23 07/25/2016   Tdap 05/24/2014    TDAP status: Up to date  Flu Vaccine status: Up to date  Pneumococcal vaccine status: Due, Education has been provided regarding the importance of this vaccine. Advised may receive this vaccine at local pharmacy or Health Dept. Aware to provide a copy of the vaccination record if obtained from local pharmacy or Health Dept. Verbalized acceptance and understanding.  Covid-19 vaccine status: Completed  vaccines  Qualifies for Shingles Vaccine? Yes   Zostavax completed No   Shingrix Completed?: No.    Education has been provided regarding the importance of this vaccine. Patient has been advised to call insurance company to determine out of pocket expense if they have not yet received this vaccine. Advised may also receive vaccine at local pharmacy or Health Dept. Verbalized acceptance and understanding.  Screening Tests Health Maintenance  Topic Date Due   FOOT EXAM  Never done   OPHTHALMOLOGY EXAM  Never done   Zoster Vaccines- Shingrix (1 of 2) Never done   PAP SMEAR-Modifier  02/20/2007   MAMMOGRAM  07/02/2015   COVID-19 Vaccine (4 - Booster for Pfizer series) 04/04/2020   DEXA SCAN  Never done   HEMOGLOBIN A1C  05/15/2021   Pneumonia Vaccine 30+ Years old (3 - PPSV23 if available, else PCV20) 07/25/2021   COLONOSCOPY (Pts 45-2yrs Insurance coverage will need to be confirmed)  04/12/2022   TETANUS/TDAP  05/23/2024   INFLUENZA VACCINE  Completed   Hepatitis C Screening  Completed   HIV Screening  Completed   HPV VACCINES  Aged Out    Health Maintenance  Health Maintenance Due  Topic Date Due   FOOT EXAM  Never done   OPHTHALMOLOGY EXAM  Never done   Zoster Vaccines- Shingrix (1 of 2) Never done   PAP SMEAR-Modifier  02/20/2007   MAMMOGRAM  07/02/2015   COVID-19 Vaccine (4 - Booster for [place] series) 04/04/2020   DEXA SCAN  Never done    Colorectal cancer screening: Type of screening: Colonoscopy. Completed 04/13/19. Repeat every 3 years  Mammogram status: Ordered 12/21/20. Pt provided with contact info and advised to call to schedule appt.   Bone Density status: Ordered 12/21/20. Pt provided with contact info and advised to call to schedule appt.  Additional Screening:  Hepatitis C Screening:  Completed 04/10/19  Vision Screening: Recommended annual ophthalmology exams for early detection of glaucoma and other disorders of the eye. Is the patient up to date with  their annual eye exam?  No  Who is the provider or what is the name of the office in which the patient attends annual eye exams? Encouraged to follow up with provider If pt is not established with a provider, would they like to be referred to a provider to establish care? No .   Dental Screening:  Recommended annual dental exams for proper oral hygiene  Community Resource Referral / Chronic Care Management: CRR required this visit?  No   CCM required this visit?  No      Plan:     I have personally reviewed and noted the following in the patient's chart:   Medical and social history Use of alcohol, tobacco or illicit drugs  Current medications and supplements including opioid prescriptions.  Functional ability and status Nutritional status Physical activity Advanced directives List of other physicians Hospitalizations, surgeries, and ER visits in previous 12 months Vitals Screenings to include cognitive, depression, and falls Referrals and appointments  In addition, I have reviewed and discussed with patient certain preventive protocols, quality metrics, and best practice recommendations. A written personalized care plan for preventive services as well as general preventive health recommendations were provided to patient.     Willette Brace, LPN   01/0/2725   Nurse Notes: None

## 2020-12-21 NOTE — Patient Instructions (Addendum)
Samantha Hinton , Thank you for taking time to come for your Medicare Wellness Visit. I appreciate your ongoing commitment to your health goals. Please review the following plan we discussed and let me know if I can assist you in the future.   Screening recommendations/referrals: Colonoscopy: Done 04/13/19 repeat every 3 years  Mammogram: order placed 12/21/20 repeat every year Bone Density: Order placed 12/21/20 Recommended yearly ophthalmology/optometry visit for glaucoma screening and checkup Recommended yearly dental visit for hygiene and checkup  Vaccinations: Influenza vaccine: Done 11/14/20  Pneumococcal vaccine: Due Tdap vaccine: Done 05/23/13 repeat every 10 years  Shingles vaccine: Shingrix discussed. Please contact your pharmacy for coverage information.    Covid-19:Completed 3/25, 4/15, 02/08/20  Advanced directives: Advance directive discussed with you today. Even though you declined this today please call our office should you change your mind and we can give you the proper paperwork for you to fill out.  Conditions/risks identified: Lose weight   Next appointment: Follow up in one year for your annual wellness visit    Preventive Care 65 Years and Older, Female Preventive care refers to lifestyle choices and visits with your health care provider that can promote health and wellness. What does preventive care include? A yearly physical exam. This is also called an annual well check. Dental exams once or twice a year. Routine eye exams. Ask your health care provider how often you should have your eyes checked. Personal lifestyle choices, including: Daily care of your teeth and gums. Regular physical activity. Eating a healthy diet. Avoiding tobacco and drug use. Limiting alcohol use. Practicing safe sex. Taking low-dose aspirin every day. Taking vitamin and mineral supplements as recommended by your health care provider. What happens during an annual well check? The services  and screenings done by your health care provider during your annual well check will depend on your age, overall health, lifestyle risk factors, and family history of disease. Counseling  Your health care provider may ask you questions about your: Alcohol use. Tobacco use. Drug use. Emotional well-being. Home and relationship well-being. Sexual activity. Eating habits. History of falls. Memory and ability to understand (cognition). Work and work Statistician. Reproductive health. Screening  You may have the following tests or measurements: Height, weight, and BMI. Blood pressure. Lipid and cholesterol levels. These may be checked every 5 years, or more frequently if you are over 20 years old. Skin check. Lung cancer screening. You may have this screening every year starting at age 50 if you have a 30-pack-year history of smoking and currently smoke or have quit within the past 15 years. Fecal occult blood test (FOBT) of the stool. You may have this test every year starting at age 73. Flexible sigmoidoscopy or colonoscopy. You may have a sigmoidoscopy every 5 years or a colonoscopy every 10 years starting at age 71. Hepatitis C blood test. Hepatitis B blood test. Sexually transmitted disease (STD) testing. Diabetes screening. This is done by checking your blood sugar (glucose) after you have not eaten for a while (fasting). You may have this done every 1-3 years. Bone density scan. This is done to screen for osteoporosis. You may have this done starting at age 60. Mammogram. This may be done every 1-2 years. Talk to your health care provider about how often you should have regular mammograms. Talk with your health care provider about your test results, treatment options, and if necessary, the need for more tests. Vaccines  Your health care provider may recommend certain vaccines, such as:  Influenza vaccine. This is recommended every year. Tetanus, diphtheria, and acellular pertussis  (Tdap, Td) vaccine. You may need a Td booster every 10 years. Zoster vaccine. You may need this after age 14. Pneumococcal 13-valent conjugate (PCV13) vaccine. One dose is recommended after age 14. Pneumococcal polysaccharide (PPSV23) vaccine. One dose is recommended after age 39. Talk to your health care provider about which screenings and vaccines you need and how often you need them. This information is not intended to replace advice given to you by your health care provider. Make sure you discuss any questions you have with your health care provider. Document Released: 03/04/2015 Document Revised: 10/26/2015 Document Reviewed: 12/07/2014 Elsevier Interactive Patient Education  2017 [employer] Prevention in the Home Falls can cause injuries. They can happen to people of all ages. There are many things you can do to make your home safe and to help prevent falls. What can I do on the outside of my home? Regularly fix the edges of walkways and driveways and fix any cracks. Remove anything that might make you trip as you walk through a door, such as a raised step or threshold. Trim any bushes or trees on the path to your home. Use bright outdoor lighting. Clear any walking paths of anything that might make someone trip, such as rocks or tools. Regularly check to see if handrails are loose or broken. Make sure that both sides of any steps have handrails. Any raised decks and porches should have guardrails on the edges. Have any leaves, snow, or ice cleared regularly. Use sand or salt on walking paths during winter. Clean up any spills in your garage right away. This includes oil or grease spills. What can I do in the bathroom? Use night lights. Install grab bars by the toilet and in the tub and shower. Do not use towel bars as grab bars. Use non-skid mats or decals in the tub or shower. If you need to sit down in the shower, use a plastic, non-slip stool. Keep the floor dry. Clean  up any water that spills on the floor as soon as it happens. Remove soap buildup in the tub or shower regularly. Attach bath mats securely with double-sided non-slip rug tape. Do not have throw rugs and other things on the floor that can make you trip. What can I do in the bedroom? Use night lights. Make sure that you have a light by your bed that is easy to reach. Do not use any sheets or blankets that are too big for your bed. They should not hang down onto the floor. Have a firm chair that has side arms. You can use this for support while you get dressed. Do not have throw rugs and other things on the floor that can make you trip. What can I do in the kitchen? Clean up any spills right away. Avoid walking on wet floors. Keep items that you use a lot in easy-to-reach places. If you need to reach something above you, use a strong step stool that has a grab bar. Keep electrical cords out of the way. Do not use floor polish or wax that makes floors slippery. If you must use wax, use non-skid floor wax. Do not have throw rugs and other things on the floor that can make you trip. What can I do with my stairs? Do not leave any items on the stairs. Make sure that there are handrails on both sides of the stairs and use them.  Fix handrails that are broken or loose. Make sure that handrails are as long as the stairways. Check any carpeting to make sure that it is firmly attached to the stairs. Fix any carpet that is loose or worn. Avoid having throw rugs at the top or bottom of the stairs. If you do have throw rugs, attach them to the floor with carpet tape. Make sure that you have a light switch at the top of the stairs and the bottom of the stairs. If you do not have them, ask someone to add them for you. What else can I do to help prevent falls? Wear shoes that: Do not have high heels. Have rubber bottoms. Are comfortable and fit you well. Are closed at the toe. Do not wear sandals. If you  use a stepladder: Make sure that it is fully opened. Do not climb a closed stepladder. Make sure that both sides of the stepladder are locked into place. Ask someone to hold it for you, if possible. Clearly mark and make sure that you can see: Any grab bars or handrails. First and last steps. Where the edge of each step is. Use tools that help you move around (mobility aids) if they are needed. These include: Canes. Walkers. Scooters. Crutches. Turn on the lights when you go into a dark area. Replace any light bulbs as soon as they burn out. Set up your furniture so you have a clear path. Avoid moving your furniture around. If any of your floors are uneven, fix them. If there are any pets around you, be aware of where they are. Review your medicines with your doctor. Some medicines can make you feel dizzy. This can increase your chance of falling. Ask your doctor what other things that you can do to help prevent falls. This information is not intended to replace advice given to you by your health care provider. Make sure you discuss any questions you have with your health care provider. Document Released: 12/02/2008 Document Revised: 07/14/2015 Document Reviewed: 03/12/2014 Elsevier Interactive Patient Education  2017 [employer].

## 2020-12-22 ENCOUNTER — Ambulatory Visit (INDEPENDENT_AMBULATORY_CARE_PROVIDER_SITE_OTHER): Payer: Medicare Other | Admitting: Family Medicine

## 2020-12-22 ENCOUNTER — Other Ambulatory Visit: Payer: Self-pay

## 2020-12-22 ENCOUNTER — Encounter (INDEPENDENT_AMBULATORY_CARE_PROVIDER_SITE_OTHER): Payer: Self-pay | Admitting: Family Medicine

## 2020-12-22 VITALS — BP 130/82 | HR 94 | Temp 97.7°F | Ht 65.0 in | Wt 197.0 lb

## 2020-12-22 DIAGNOSIS — E1169 Type 2 diabetes mellitus with other specified complication: Secondary | ICD-10-CM | POA: Diagnosis not present

## 2020-12-22 DIAGNOSIS — E559 Vitamin D deficiency, unspecified: Secondary | ICD-10-CM | POA: Diagnosis not present

## 2020-12-22 DIAGNOSIS — Z6839 Body mass index (BMI) 39.0-39.9, adult: Secondary | ICD-10-CM

## 2020-12-22 MED ORDER — VITAMIN D (ERGOCALCIFEROL) 1.25 MG (50000 UNIT) PO CAPS: 50000.0000 [IU] | ORAL_CAPSULE | ORAL | 0 refills | Status: DC

## 2020-12-22 MED ORDER — SEMAGLUTIDE (1 MG/DOSE) 4 MG/3ML ~~LOC~~ SOPN: 1.0000 mg | PEN_INJECTOR | SUBCUTANEOUS | 0 refills | Status: DC

## 2020-12-26 ENCOUNTER — Other Ambulatory Visit (INDEPENDENT_AMBULATORY_CARE_PROVIDER_SITE_OTHER): Payer: Self-pay | Admitting: Family Medicine

## 2020-12-26 DIAGNOSIS — E1169 Type 2 diabetes mellitus with other specified complication: Secondary | ICD-10-CM

## 2020-12-26 NOTE — Progress Notes (Signed)
Chief Complaint:   OBESITY Samantha Hinton is here to discuss her progress with her obesity treatment plan along with follow-up of her obesity related diagnoses. Samantha Hinton is on the Category 3 Plan and states she is following her eating plan approximately 80% of the time. Samantha Hinton states she is walking 10 minutes 3 times per week.  Today's visit was #: 8 Starting weight: 230 lbs Starting date: 07/14/2020 Today's weight: 197 lbs Today's date: 12/22/2020 Total lbs lost to date: 33 Total lbs lost since last in-office visit: +1  Interim History: Samantha Hinton notes she fell of the wagon and didn't meal prep well. She is eating too many carbs like pasta and potatoes. She is not getting in enough water- 30-40 oz per day. Will review labs.   Subjective:   1. Type 2 diabetes mellitus with other specified complication, without long-term current use of insulin (Samantha Hinton) Samantha Hinton took 1 mg Ozempic for the first time yesterday and tolerated it well thus far. She denies issues with her blood sugar. Pt is asymptomatic and has no concerns.  2. Vitamin D deficiency She is currently taking prescription vitamin D 50,000 IU each week. She denies nausea, vomiting or muscle weakness.  Assessment/Plan:  No orders of the defined types were placed in this encounter.   Medications Discontinued During This Encounter  Medication Reason   Vitamin D, Ergocalciferol, (DRISDOL) 1.25 MG (50000 UNIT) CAPS capsule Reorder   Semaglutide, 1 MG/DOSE, 4 MG/3ML SOPN Reorder     Meds ordered this encounter  Medications   Vitamin D, Ergocalciferol, (DRISDOL) 1.25 MG (50000 UNIT) CAPS capsule    Sig: Take 1 capsule by mouth every 7 days.    Dispense:  4 capsule    Refill:  0    Ov for rf   Semaglutide, 1 MG/DOSE, 4 MG/3ML SOPN    Sig: Inject 1 mg as directed once a week.    Dispense:  3 mL    Refill:  0    OV for rf.     1. Type 2 diabetes mellitus with other specified complication, without long-term current use of insulin  (HCC) Good blood sugar control is important to decrease the likelihood of diabetic complications such as nephropathy, neuropathy, limb loss, blindness, coronary artery disease, and death. Intensive lifestyle modification including diet, exercise and weight loss are the first line of treatment for diabetes.  Samantha Hinton will continue Ozempic at same dose. I discussed the importance of hydration to prevent constipation and meal plan adherence to prevent side effects of medication.  Refill- Semaglutide, 1 MG/DOSE, 4 MG/3ML SOPN; Inject 1 mg as directed once a week.  Dispense: 3 mL; Refill: 0  2. Vitamin D deficiency Low Vitamin D level contributes to fatigue and are associated with obesity, breast, and colon cancer. She agrees to continue to take prescription Vitamin D 50,000 IU every week and will follow-up for routine testing of Vitamin D, at least 2-3 times per year to avoid over-replacement.  Refill- Vitamin D, Ergocalciferol, (DRISDOL) 1.25 MG (50000 UNIT) CAPS capsule; Take 1 capsule by mouth every 7 days.  Dispense: 4 capsule; Refill: 0  3. Obesity with current BMI of 32.8  Samantha Hinton is currently in the action stage of change. As such, her goal is to continue with weight loss efforts. She has agreed to the Category 3 Plan.   Focus on increasing and measuring proteins and measuring snacks.  Exercise goals: Older adults should follow the adult guidelines. When older adults cannot meet the adult guidelines,  they should be as physically active as their abilities and conditions will allow.   Behavioral modification strategies: increasing lean protein intake, no skipping meals, keeping healthy foods in the home, and avoiding temptations.  Samantha Hinton has agreed to follow-up with our clinic in 2-3 weeks. She was informed of the importance of frequent follow-up visits to maximize her success with intensive lifestyle modifications for her multiple health conditions.   Objective:   Blood pressure 130/82,  pulse 94, temperature 97.7 F (36.5 C), height 5\' 5"  (1.651 m), weight 197 lb (89.4 kg), SpO2 95 %. Body mass index is 32.78 kg/m.  General: Cooperative, alert, well developed, in no acute distress. HEENT: Conjunctivae and lids unremarkable. Cardiovascular: Regular rhythm.  Lungs: Normal work of breathing. Neurologic: No focal deficits.   Lab Results  Component Value Date   CREATININE 1.44 (H) 11/15/2020   BUN 26 11/15/2020   NA 140 11/15/2020   K 3.9 11/15/2020   CL 89 (L) 11/15/2020   CO2 31 (H) 11/15/2020   Lab Results  Component Value Date   ALT 30 11/15/2020   AST 35 11/15/2020   ALKPHOS 78 11/15/2020   BILITOT 0.6 11/15/2020   Lab Results  Component Value Date   HGBA1C 6.0 (H) 11/15/2020   HGBA1C 6.2 (H) 07/14/2020   HGBA1C 6.0 05/09/2020   HGBA1C 6.9 (H) 08/04/2019   Lab Results  Component Value Date   INSULIN 6.1 07/14/2020   Lab Results  Component Value Date   TSH 3.06 05/09/2020   Lab Results  Component Value Date   CHOL 121 11/15/2020   HDL 39 (L) 11/15/2020   LDLCALC 48 11/15/2020   LDLDIRECT 242.0 05/09/2020   TRIG 215 (H) 11/15/2020   CHOLHDL 3.1 11/15/2020   Lab Results  Component Value Date   VD25OH 57.1 11/15/2020   VD25OH 24.6 (L) 07/14/2020   Lab Results  Component Value Date   WBC 6.2 05/09/2020   HGB 13.6 05/09/2020   HCT 41.2 05/09/2020   MCV 94.1 05/09/2020   PLT 195.0 05/09/2020   Lab Results  Component Value Date   IRON 112 04/10/2019   FERRITIN 160.8 04/10/2019    Obesity Behavioral Intervention:   Approximately 15 minutes were spent on the discussion below.  ASK: We discussed the diagnosis of obesity with Samantha Hinton today and Samantha Hinton agreed to give [place] permission to discuss obesity behavioral modification therapy today.  ASSESS: Samantha Hinton has the diagnosis of obesity and her BMI today is 32.8. Samantha Hinton is in the action stage of change.   ADVISE: Samantha Hinton was educated on the multiple health risks of obesity as well as  the benefit of weight loss to improve her health. She was advised of the need for long term treatment and the importance of lifestyle modifications to improve her current health and to decrease her risk of future health problems.  AGREE: Multiple dietary modification options and treatment options were discussed and Samantha Hinton agreed to follow the recommendations documented in the above note.  ARRANGE: Samantha Hinton was educated on the importance of frequent visits to treat obesity as outlined per CMS and USPSTF guidelines and agreed to schedule her next follow up appointment today.  Attestation Statements:   Reviewed by clinician on day of visit: allergies, medications, problem list, medical history, surgical history, family history, social history, and previous encounter notes.  Coral Ceo, CMA, am acting as transcriptionist for [employer], DO.  I have reviewed the above documentation for accuracy and completeness, and I agree with the above. -  Marjory Sneddon, D.O.  The [place] was signed into law in 2016 which includes the topic of electronic health records.  This provides immediate access to information in MyChart.  This includes consultation notes, operative notes, office notes, lab results and pathology reports.  If you have any questions about what you read please let us know at your next visit so we can discuss your concerns and take corrective action if need be.  We are right here with you.

## 2020-12-27 ENCOUNTER — Telehealth (INDEPENDENT_AMBULATORY_CARE_PROVIDER_SITE_OTHER): Payer: Self-pay

## 2020-12-27 NOTE — Telephone Encounter (Signed)
New PAP application sent to [employer] for Ozempic 1mg .

## 2020-12-27 NOTE — Telephone Encounter (Signed)
Fax confirmation was received from [employer] acknowledging that the fax was received (all 7 pages) without error.

## 2021-01-04 ENCOUNTER — Ambulatory Visit (INDEPENDENT_AMBULATORY_CARE_PROVIDER_SITE_OTHER): Payer: Medicare Other | Admitting: Family Medicine

## 2021-01-17 ENCOUNTER — Other Ambulatory Visit: Payer: Self-pay | Admitting: Family Medicine

## 2021-01-18 ENCOUNTER — Telehealth: Payer: Self-pay | Admitting: Pharmacist

## 2021-01-18 NOTE — Chronic Care Management (AMB) (Signed)
Chronic Care Management Pharmacy Assistant   Name: Samantha Hinton  MRN: [id number] DOB: [birth year]   Reason for Encounter: Medication Review   Conditions to be addressed/monitored: HTN   Recent office visits:  12/21/20 Willette Brace, LPN - Patient presented for Medicare annual wellness exam and other concerns.  Recent consult visits:  12/22/20 [name], DO ([facility]) - Patient presented for type 2 diabetes and other concerns. No medication changes,  Hospital visits:  None in previous 6 months  Medications: Outpatient Encounter Medications as of 01/18/2021  Medication Sig Note   albuterol (PROAIR HFA) 108 (90 Base) MCG/ACT inhaler 2 puffs every 4 hours as needed only  if your can't catch your breath    albuterol (PROVENTIL) (2.5 MG/3ML) 0.083% nebulizer solution Take 3 mLs (2.5 mg total) by nebulization every 6 (six) hours as needed for wheezing or shortness of breath.    allopurinol (ZYLOPRIM) 100 MG tablet TAKE ONE TABLET BY MOUTH TWICE DAILY    bisoprolol-hydrochlorothiazide (ZIAC) 5-6.25 MG tablet TAKE ONE TABLET BY MOUTH TWICE DAILY    BREZTRI AEROSPHERE 160-9-4.8 MCG/ACT AERO INHALE TWO PUFFS BY MOUTH INTO LUNGS TWICE DAILY    Budeson-Glycopyrrol-Formoterol (BREZTRI AEROSPHERE) 160-9-4.8 MCG/ACT AERO Inhale 2 puffs into the lungs in the morning and at bedtime.    cloNIDine (CATAPRES) 0.1 MG tablet Take 1 tablet (0.1 mg total) by mouth 2 (two) times daily.    Colchicine 0.6 MG CAPS Take 0.6 mg by mouth 2 (two) times daily as needed. 07/14/2020: Using prn   gabapentin (NEURONTIN) 100 MG capsule TAKE ONE CAPSULE BY MOUTH TWICE DAILY    loratadine (CLARITIN) 10 MG tablet Take 10 mg by mouth daily.    omeprazole (PRILOSEC) 40 MG capsule TAKE ONE CAPSULE BY MOUTH TWICE DAILY    ondansetron (ZOFRAN ODT) 4 MG disintegrating tablet Take 1 tablet (4 mg total) by mouth every 8 (eight) hours as needed for nausea or vomiting.    polyethylene glycol (MIRALAX / GLYCOLAX) 17  g packet Take 17 g by mouth 2 (two) times daily. Until stooling regularly    predniSONE (DELTASONE) 10 MG tablet Take  4 each am x 2 days,   2 each am x 2 days,  1 each am x 2 days and stop (Patient taking differently: Take  4 each am x 2 days,   2 each am x 2 days,  1 each am x 2 days and stop)    rosuvastatin (CRESTOR) 10 MG tablet TAKE ONE TABLET BY MOUTH EVERY EVENING    Semaglutide, 1 MG/DOSE, 4 MG/3ML SOPN Inject 1 mg as directed once a week.    venlafaxine XR (EFFEXOR XR) 75 MG 24 hr capsule Take 1 capsule (75 mg total) by mouth daily with breakfast.    Vitamin D, Ergocalciferol, (DRISDOL) 1.25 MG (50000 UNIT) CAPS capsule Take 1 capsule by mouth every 7 days.    No facility-administered encounter medications on file as of 01/18/2021.  Reviewed chart prior to disease state call. Spoke with patient regarding BP  Recent Office Vitals: BP Readings from Last 3 Encounters:  12/22/20 130/82  12/08/20 106/74  11/15/20 99/70   Pulse Readings from Last 3 Encounters:  12/22/20 94  12/08/20 95  11/15/20 93    Wt Readings from Last 3 Encounters:  12/22/20 197 lb (89.4 kg)  12/08/20 196 lb (88.9 kg)  11/15/20 201 lb (91.2 kg)     Kidney Function Lab Results  Component Value Date/Time   CREATININE 1.44 (H)  11/15/2020 09:43 AM   CREATININE 1.01 05/09/2020 10:22 AM   GFR 58.85 (L) 05/09/2020 10:22 AM   GFRNONAA >60 12/27/2014 07:57 AM   GFRAA >60 12/27/2014 07:57 AM    BMP Latest Ref Rng & Units 11/15/2020 05/09/2020 06/30/2019  Glucose 70 - 99 mg/dL 106(H) 114(H) 127(H)  BUN 8 - 27 mg/dL 26 22 14   Creatinine 0.57 - 1.00 mg/dL 1.44(H) 1.01 0.86  BUN/Creat Ratio 12 - 28 18 - -  Sodium 134 - 144 mmol/L 140 137 137  Potassium 3.5 - 5.2 mmol/L 3.9 4.5 3.9  Chloride 96 - 106 mmol/L 89(L) 93(L) 96  CO2 20 - 29 mmol/L 31(H) 33(H) 28  Calcium 8.7 - 10.3 mg/dL 10.2 9.5 9.0    Current antihypertensive regimen:  Bisoprolol- HCTZ 5-6.25 MG - take 1 tablet twice daily in the morning and with  dinner   BP Readings from Last 3 Encounters:  12/22/20 130/82  12/08/20 106/74  11/15/20 99/70    Lab Results  Component Value Date   HGBA1C 6.0 (H) 11/15/2020     Patient obtains medications through Adherence Packaging  30 Days   Last adherence delivery included:  Gabapentin 100 mg - take 1 tablet in the am and 1 tablet with dinner. Gabapentin 300 mg - take 1 tablet at bedtime. Albuterol PROAIR inhaler - take 2 puffs every 4 hours as needed. Loratadine 10 mg - take daily in the morning  Venlafaxine 75 mg - take 1 tablet in the morning Omeprazole 40 mg - take 1 capsule twice daily in the morning and with dinner Rosuvastatin 10 mg - take 1 tablet with dinner Allopurinol 100 mg - take 1 tablet twice daily in the morning and with dinner Bisoprolol- HCTZ 5-6.25 MG - take 1 tablet twice daily in the morning and with dinner Vitamin D 50,000 units - take 1 tablet once weekly Clonidine 0.1mg  - take 1 tablet twice daily in the morning and with dinner   Patient declined the following medications (meds) due to (reason) Breztri inhaler - 2 puffs twice daily  Prednisone 10 mg - take as needed for flare ups with breathing.    Patient is due for next adherence delivery on: 01/30/21. Called patient and reviewed medications and coordinated delivery. Confirmed Packaging for 30 DS  This delivery to include: Rosuvastatin 10 mg - take 1 tablet with dinner Bisoprolol- HCTZ 5-6.25 MG - take 1 tablet twice daily at breakfast and dinner Omeprazole 40 mg - take 1 capsule twice daily at breakfast and dinner Gabapentin 100 mg - take 1 tablet  twice daily at breakfast and dinner Allopurinol 100 mg - take 1 tablet twice daily at breakfast and dinner Vitamin D 50,000 units - take 1 tablet once weekly (Wednesday) Venlafaxine 75 mg - take 1 tablet daily at breakfast Loratadine 10 mg - take 1 tablet daily at breakfast Clonidine 0.1mg  - take 1 tablet twice daily at breakfast and dinner    ** Unable to  reach to coordinate delivery Pharmacist advised left msg with pt to advise they will attempt delivery on 01/30/21   Care Gaps: CCM- Need AWV-11/23 BP- 130/82 (12/22/20) Mammogram - Scheduled DEXA - Overdue Foot Exam - Overdue Eye Exam- Overdue Zoster Vaccine- Overdue PAP Smear- Overdue COVID Booster - Overdue Lab Results  Component Value Date   HGBA1C 6.0 (H) 11/15/2020    Star Rating Drugs: Rosuvastatin (Crestor) 10 mg - Last filled 12/26/20 30 DS at [facility] Semaglutide (Ozempic) - Patient Assistance  Patient Assistance: [place]-  Renewal App completed and faxed with confirmation by weight mgmt on 12/27/20    Ned Clines [facility] Clinical Pharmacist Assistant [phone]

## 2021-01-24 ENCOUNTER — Ambulatory Visit (INDEPENDENT_AMBULATORY_CARE_PROVIDER_SITE_OTHER): Payer: Medicare Other | Admitting: Family Medicine

## 2021-02-01 ENCOUNTER — Telehealth: Payer: Self-pay | Admitting: Pharmacist

## 2021-02-01 ENCOUNTER — Telehealth: Payer: Self-pay | Admitting: Family Medicine

## 2021-02-01 NOTE — Telephone Encounter (Signed)
Spoke with pharmacy and dosage clarified.

## 2021-02-01 NOTE — Telephone Encounter (Signed)
Larisa from [facility] pharmacy called to verify Gabapentin dosage. PT has requested a refill of 300 mg gabapentin but pharmacy thinks this was discontinued by another provider and pt should be on the 100 mg.  Dow Adolph [phone]

## 2021-02-01 NOTE — Chronic Care Management (AMB) (Signed)
Per [facility] patient is requesting 300 mg of Gabapentin be added in her packs, from chart appears that it was discontinued on 8/22 call to the prescribing office to confirm spoke to [place] whom advised she is only showing the 100 mg 2x a day as active and will speak with provider and get back with me to confirm.  Call Back from Dr Darliss Ridgel office they confirm she should be only on 100 mg twice daily. Call to patient left voicemail to advise her of the above  [place] Pharmacist Assistant [phone]

## 2021-02-02 ENCOUNTER — Other Ambulatory Visit: Payer: Self-pay

## 2021-02-02 ENCOUNTER — Ambulatory Visit (INDEPENDENT_AMBULATORY_CARE_PROVIDER_SITE_OTHER): Payer: Medicare Other | Admitting: Family Medicine

## 2021-02-02 ENCOUNTER — Encounter (INDEPENDENT_AMBULATORY_CARE_PROVIDER_SITE_OTHER): Payer: Self-pay | Admitting: Family Medicine

## 2021-02-02 VITALS — BP 147/85 | HR 64 | Temp 97.1°F | Ht 65.0 in | Wt 196.0 lb

## 2021-02-02 DIAGNOSIS — Z6832 Body mass index (BMI) 32.0-32.9, adult: Secondary | ICD-10-CM | POA: Diagnosis not present

## 2021-02-02 DIAGNOSIS — E559 Vitamin D deficiency, unspecified: Secondary | ICD-10-CM | POA: Diagnosis not present

## 2021-02-02 DIAGNOSIS — E1169 Type 2 diabetes mellitus with other specified complication: Secondary | ICD-10-CM | POA: Diagnosis not present

## 2021-02-02 DIAGNOSIS — K5900 Constipation, unspecified: Secondary | ICD-10-CM | POA: Diagnosis not present

## 2021-02-02 DIAGNOSIS — Z6839 Body mass index (BMI) 39.0-39.9, adult: Secondary | ICD-10-CM

## 2021-02-02 MED ORDER — SEMAGLUTIDE (1 MG/DOSE) 4 MG/3ML ~~LOC~~ SOPN
1.0000 mg | PEN_INJECTOR | SUBCUTANEOUS | 0 refills | Status: DC
Start: 2021-02-02 — End: 2021-04-13

## 2021-02-02 MED ORDER — VITAMIN D (ERGOCALCIFEROL) 1.25 MG (50000 UNIT) PO CAPS: 50000.0000 [IU] | ORAL_CAPSULE | ORAL | 0 refills | Status: DC

## 2021-02-02 MED ORDER — LINACLOTIDE 145 MCG PO CAPS: 145.0000 ug | ORAL_CAPSULE | Freq: Every day | ORAL | 0 refills | Status: DC

## 2021-02-06 NOTE — Progress Notes (Signed)
Chief Complaint:   OBESITY Samantha Hinton is here to discuss her progress with her obesity treatment plan along with follow-up of her obesity related diagnoses. Samantha Hinton is on the Category 3 Plan and states she is following her eating plan approximately 70% of the time. Samantha Hinton states she is not currently exercising.  Today's visit was #: 9 Starting weight: 230 lbs Starting date: 07/14/2020 Today's weight: 196 lbs Today's date: 02/02/2021 Total lbs lost to date: 34 Total lbs lost since last in-office visit: 1  Interim History: Samantha Hinton has been sick with RSV and other viral illnesses. She was recently on prednisone for 1 week or so. She is very happy that she lost weight despite these challenges.  Subjective:   1. Type 2 diabetes mellitus with other specified complication, without long-term current use of insulin (HCC) Samantha Hinton is not checking her blood sugars at home, but she is feeling well otherwise. She is taking 1 mg of Ozempic weekly and she is tolerating it well.  2. Constipation, unspecified constipation type Samantha Hinton's constipation actually worsened with recent illness and not drinking enough water.  3. Vitamin D deficiency Samantha Hinton is currently taking prescription vitamin D 50,000 IU each week. She denies nausea, vomiting or muscle weakness.  Assessment/Plan:   Orders Placed This Encounter  Procedures   Hemoglobin A1c   Comprehensive metabolic panel    Medications Discontinued During This Encounter  Medication Reason   Budeson-Glycopyrrol-Formoterol (BREZTRI AEROSPHERE) 160-9-4.8 MCG/ACT AERO Duplicate   Vitamin D, Ergocalciferol, (DRISDOL) 1.25 MG (50000 UNIT) CAPS capsule Reorder   Semaglutide, 1 MG/DOSE, 4 MG/3ML SOPN Reorder     Meds ordered this encounter  Medications   Semaglutide, 1 MG/DOSE, 4 MG/3ML SOPN    Sig: Inject 1 mg as directed once a week.    Dispense:  3 mL    Refill:  0    OV for rf.   Vitamin D, Ergocalciferol, (DRISDOL) 1.25 MG (50000 UNIT)  CAPS capsule    Sig: Take 1 capsule by mouth every 7 days.    Dispense:  4 capsule    Refill:  0    Ov for rf   linaclotide (LINZESS) 145 MCG CAPS capsule    Sig: Take 1 capsule (145 mcg total) by mouth daily.    Dispense:  30 capsule    Refill:  0     1. Type 2 diabetes mellitus with other specified complication, without long-term current use of insulin ([place]) Samantha Hinton will continue Ozempic 1 mg weekly and we will refill for 1 month. We will recheck A1c and CMP at her next office visit. Good blood sugar control is important to decrease the likelihood of diabetic complications such as nephropathy, neuropathy, limb loss, blindness, coronary artery disease, and death. Intensive lifestyle modification including diet, exercise and weight loss are the first line of treatment for diabetes.   - Semaglutide, 1 MG/DOSE, 4 MG/3ML SOPN; Inject 1 mg as directed once a week.  Dispense: 3 mL; Refill: 0 - Hemoglobin A1c - Comprehensive metabolic panel  2. Constipation, unspecified constipation type Samantha Hinton agreed to start Linzess 145 mcg daily with no refills. Healthy bowel regimen and habits were discussed with the patient today.  - linaclotide (LINZESS) 145 MCG CAPS capsule; Take 1 capsule (145 mcg total) by mouth daily.  Dispense: 30 capsule; Refill: 0  3. Vitamin D deficiency We will refill prescription Vitamin D 50,000 IU every week for 1 month. Samantha Hinton will follow-up for routine testing of Vitamin D, at least 2-3 times  per year to avoid over-replacement.  - Vitamin D, Ergocalciferol, (DRISDOL) 1.25 MG (50000 UNIT) CAPS capsule; Take 1 capsule by mouth every 7 days.  Dispense: 4 capsule; Refill: 0  4. Obesity with current BMI of 32.6 Samantha Hinton is currently in the action stage of change. As such, her goal is to continue with weight loss efforts. She has agreed to the Category 3 Plan.   We will obtain labs at her next office visit, non-fasting. Orders were placed today.  Exercise goals: All adults  should avoid inactivity. Some physical activity is better than none, and adults who participate in any amount of physical activity gain some health benefits.  Behavioral modification strategies: holiday eating strategies .  Samantha Hinton has agreed to follow-up with our clinic in 3 weeks. She was informed of the importance of frequent follow-up visits to maximize her success with intensive lifestyle modifications for her multiple health conditions.   Objective:   Blood pressure (!) 147/85, pulse 64, temperature (!) 97.1 F (36.2 C), height 5\' 5"  (1.651 m), weight 196 lb (88.9 kg), SpO2 96 %. Body mass index is 32.62 kg/m.  General: Cooperative, alert, well developed, in no acute distress. HEENT: Conjunctivae and lids unremarkable. Cardiovascular: Regular rhythm.  Lungs: Normal work of breathing. Neurologic: No focal deficits.   Lab Results  Component Value Date   CREATININE 1.44 (H) 11/15/2020   BUN 26 11/15/2020   NA 140 11/15/2020   K 3.9 11/15/2020   CL 89 (L) 11/15/2020   CO2 31 (H) 11/15/2020   Lab Results  Component Value Date   ALT 30 11/15/2020   AST 35 11/15/2020   ALKPHOS 78 11/15/2020   BILITOT 0.6 11/15/2020   Lab Results  Component Value Date   HGBA1C 6.0 (H) 11/15/2020   HGBA1C 6.2 (H) 07/14/2020   HGBA1C 6.0 05/09/2020   HGBA1C 6.9 (H) 08/04/2019   Lab Results  Component Value Date   INSULIN 6.1 07/14/2020   Lab Results  Component Value Date   TSH 3.06 05/09/2020   Lab Results  Component Value Date   CHOL 121 11/15/2020   HDL 39 (L) 11/15/2020   LDLCALC 48 11/15/2020   LDLDIRECT 242.0 05/09/2020   TRIG 215 (H) 11/15/2020   CHOLHDL 3.1 11/15/2020   Lab Results  Component Value Date   VD25OH 57.1 11/15/2020   VD25OH 24.6 (L) 07/14/2020   Lab Results  Component Value Date   WBC 6.2 05/09/2020   HGB 13.6 05/09/2020   HCT 41.2 05/09/2020   MCV 94.1 05/09/2020   PLT 195.0 05/09/2020   Lab Results  Component Value Date   IRON 112 04/10/2019    FERRITIN 160.8 04/10/2019    Obesity Behavioral Intervention:   Approximately 15 minutes were spent on the discussion below.  ASK: We discussed the diagnosis of obesity with Samantha Hinton today and Samantha Hinton agreed to give [place] permission to discuss obesity behavioral modification therapy today.  ASSESS: Samantha Hinton has the diagnosis of obesity and her BMI today is 32.6. Samantha Hinton is in the action stage of change.   ADVISE: Samantha Hinton was educated on the multiple health risks of obesity as well as the benefit of weight loss to improve her health. She was advised of the need for long term treatment and the importance of lifestyle modifications to improve her current health and to decrease her risk of future health problems.  AGREE: Multiple dietary modification options and treatment options were discussed and Samantha Hinton agreed to follow the recommendations documented in the above note.  ARRANGE: Samantha Hinton was educated on the importance of frequent visits to treat obesity as outlined per CMS and USPSTF guidelines and agreed to schedule her next follow up appointment today.  Attestation Statements:   Reviewed by clinician on day of visit: allergies, medications, problem list, medical history, surgical history, family history, social history, and previous encounter notes.   Wilhemena Durie, am acting as transcriptionist for [employer], DO.  I have reviewed the above documentation for accuracy and completeness, and I agree with the above. Samantha Hinton, D.O.  The [place] was signed into law in 2016 which includes the topic of electronic health records.  This provides immediate access to information in MyChart.  This includes consultation notes, operative notes, office notes, lab results and pathology reports.  If you have any questions about what you read please let us know at your next visit so we can discuss your concerns and take corrective action if need be.  We are right here  with you.

## 2021-02-08 ENCOUNTER — Ambulatory Visit: Payer: Medicare Other

## 2021-02-16 ENCOUNTER — Other Ambulatory Visit: Payer: Self-pay | Admitting: Family Medicine

## 2021-02-16 ENCOUNTER — Telehealth: Payer: Self-pay | Admitting: Pharmacist

## 2021-02-16 ENCOUNTER — Other Ambulatory Visit: Payer: Self-pay | Admitting: Internal Medicine

## 2021-02-16 NOTE — Chronic Care Management (AMB) (Signed)
Chronic Care Management Pharmacy Assistant   Name: Samantha Hinton  MRN: [id number] DOB: [birth year]  Reason for Encounter: Medication Review   Conditions to be addressed/monitored: HTN  Recent office visits:  None  Recent consult visits:  02/02/21 [name], DO ([facility]) - Patient presented for Type 2 diabetes with other specified complication and other concerns. Prescribed Linaclotide 145 mcg.  Hospital visits:  None in previous 6 months  Medications: Outpatient Encounter Medications as of 02/16/2021  Medication Sig Note   albuterol (PROAIR HFA) 108 (90 Base) MCG/ACT inhaler 2 puffs every 4 hours as needed only  if your can't catch your breath    albuterol (PROVENTIL) (2.5 MG/3ML) 0.083% nebulizer solution Take 3 mLs (2.5 mg total) by nebulization every 6 (six) hours as needed for wheezing or shortness of breath.    allopurinol (ZYLOPRIM) 100 MG tablet TAKE ONE TABLET BY MOUTH TWICE DAILY    bisoprolol-hydrochlorothiazide (ZIAC) 5-6.25 MG tablet TAKE ONE TABLET BY MOUTH TWICE DAILY    BREZTRI AEROSPHERE 160-9-4.8 MCG/ACT AERO INHALE TWO PUFFS BY MOUTH INTO LUNGS TWICE DAILY    cloNIDine (CATAPRES) 0.1 MG tablet Take 1 tablet (0.1 mg total) by mouth 2 (two) times daily.    Colchicine 0.6 MG CAPS Take 0.6 mg by mouth 2 (two) times daily as needed. 07/14/2020: Using prn   gabapentin (NEURONTIN) 100 MG capsule TAKE ONE CAPSULE BY MOUTH TWICE DAILY    linaclotide (LINZESS) 145 MCG CAPS capsule Take 1 capsule (145 mcg total) by mouth daily.    loratadine (CLARITIN) 10 MG tablet Take 10 mg by mouth daily.    omeprazole (PRILOSEC) 40 MG capsule TAKE ONE CAPSULE BY MOUTH TWICE DAILY    ondansetron (ZOFRAN ODT) 4 MG disintegrating tablet Take 1 tablet (4 mg total) by mouth every 8 (eight) hours as needed for nausea or vomiting.    polyethylene glycol (MIRALAX / GLYCOLAX) 17 g packet Take 17 g by mouth 2 (two) times daily. Until stooling regularly    predniSONE (DELTASONE) 10 MG  tablet Take  4 each am x 2 days,   2 each am x 2 days,  1 each am x 2 days and stop (Patient taking differently: Take  4 each am x 2 days,   2 each am x 2 days,  1 each am x 2 days and stop)    rosuvastatin (CRESTOR) 10 MG tablet TAKE ONE TABLET BY MOUTH EVERY EVENING    Semaglutide, 1 MG/DOSE, 4 MG/3ML SOPN Inject 1 mg as directed once a week.    venlafaxine XR (EFFEXOR XR) 75 MG 24 hr capsule Take 1 capsule (75 mg total) by mouth daily with breakfast.    Vitamin D, Ergocalciferol, (DRISDOL) 1.25 MG (50000 UNIT) CAPS capsule Take 1 capsule by mouth every 7 days.    No facility-administered encounter medications on file as of 02/16/2021.  Reviewed chart prior to disease state call. Spoke with patient regarding BP  Recent Office Vitals: BP Readings from Last 3 Encounters:  02/02/21 (!) 147/85  12/22/20 130/82  12/08/20 106/74   Pulse Readings from Last 3 Encounters:  02/02/21 64  12/22/20 94  12/08/20 95    Wt Readings from Last 3 Encounters:  02/02/21 196 lb (88.9 kg)  12/22/20 197 lb (89.4 kg)  12/08/20 196 lb (88.9 kg)     Kidney Function Lab Results  Component Value Date/Time   CREATININE 1.44 (H) 11/15/2020 09:43 AM   CREATININE 1.01 05/09/2020 10:22 AM   GFR 58.85 (  L) 05/09/2020 10:22 AM   GFRNONAA >60 12/27/2014 07:57 AM   GFRAA >60 12/27/2014 07:57 AM    BMP Latest Ref Rng & Units 11/15/2020 05/09/2020 06/30/2019  Glucose 70 - 99 mg/dL 106(H) 114(H) 127(H)  BUN 8 - 27 mg/dL 26 22 14   Creatinine 0.57 - 1.00 mg/dL 1.44(H) 1.01 0.86  BUN/Creat Ratio 12 - 28 18 - -  Sodium 134 - 144 mmol/L 140 137 137  Potassium 3.5 - 5.2 mmol/L 3.9 4.5 3.9  Chloride 96 - 106 mmol/L 89(L) 93(L) 96  CO2 20 - 29 mmol/L 31(H) 33(H) 28  Calcium 8.7 - 10.3 mg/dL 10.2 9.5 9.0    Current antihypertensive regimen:  Bisoprolol- HCTZ 5-6.25 MG - take 1 tablet twice daily in the morning and with dinner   Reviewed chart for medication changes ahead of medication coordination call.  No OVs,  Consults, or hospital visits since last care coordination call/Pharmacist visit.  No medication changes indicated.  BP Readings from Last 3 Encounters:  02/02/21 (!) 147/85  12/22/20 130/82  12/08/20 106/74    Lab Results  Component Value Date   HGBA1C 6.0 (H) 11/15/2020     Patient obtains medications through Adherence Packaging  30 Days   Last adherence delivery included:  Rosuvastatin 10 mg - take 1 tablet with dinner Bisoprolol- HCTZ 5-6.25 MG - take 1 tablet twice daily at breakfast and dinner Omeprazole 40 mg - take 1 capsule twice daily at breakfast and dinner Gabapentin 100 mg - take 1 tablet  twice daily at breakfast and dinner Allopurinol 100 mg - take 1 tablet twice daily at breakfast and dinner Vitamin D 50,000 units - take 1 tablet once weekly (Wednesday) Venlafaxine 75 mg - take 1 tablet daily at breakfast Loratadine 10 mg - take 1 tablet daily at breakfast Clonidine 0.1mg  - take 1 tablet twice daily at breakfast and dinner   Patient is due for next adherence delivery on: 02/28/21. Called patient and reviewed medications and coordinated delivery. Confirmed Packs for 30 DS  This delivery to include: Rosuvastatin 10 mg - take 1 tablet with dinner Bisoprolol- HCTZ 5-6.25 MG - take 1 tablet twice daily at breakfast and dinner Omeprazole 40 mg - take 1 capsule twice daily at breakfast and dinner Gabapentin 100 mg - take 1 tablet  twice daily at breakfast and dinner Allopurinol 100 mg - take 1 tablet twice daily at breakfast and dinner Vitamin D 50,000 units - take 1 tablet once weekly (Wednesday) Venlafaxine 75 mg - take 1 tablet daily at breakfast Loratadine 10 mg - take 1 tablet daily at breakfast Clonidine 0.1mg  - take 1 tablet twice daily at breakfast and dinner Albuterol Inhaler   Unable to reach Attempted to Confirm delivery date of 02/28/21, advised patient that pharmacy will contact them the morning she is scheduled for delivery  Care Gaps: CCM-  Need AWV-11/23 BP- 130/82 (12/22/20) Mammogram - Scheduled DEXA - Overdue Foot Exam - Overdue Eye Exam- Overdue Zoster Vaccine- Overdue PAP Smear- Overdue COVID Booster - Overdue Lab Results  Component Value Date   HGBA1C 6.0 (H) 11/15/2020  Notes:  Call to [employer] to follow up on status of Patient assistance application for Ozempic, was advised that they received the application but are unable to process as the copy of the Proof of income( social security benefit letter) is too light to read. Call to [facility] to ask that they re-fax that information and the office is not open on Fridays,  will send a message through the computer and follow up on next week to confirm receipt.  02/21/21 Call to Dr. Colman Cater office per the above left message to be forwarded to her nurse.  Star Rating Drugs: Rosuvastatin (Crestor) 10 mg - Last filled 01/23/21 30 DS at [facility] Semaglutide (Ozempic) - Patient Assistance    Patient Assistance: Ozempic- Re-fax of financials requested and completed on 02/21/21  Unable to reach CCM Deadline 02/22/21 @ 5 pm   [place] Pharmacist Assistant [phone]

## 2021-02-21 ENCOUNTER — Telehealth (INDEPENDENT_AMBULATORY_CARE_PROVIDER_SITE_OTHER): Payer: Self-pay | Admitting: Family Medicine

## 2021-02-21 NOTE — Telephone Encounter (Signed)
Received message from pharmacy assistant at pt's PCP office stating that [employer] contacted their office and advised that a new copy of pt's SS award letter needed to be resubmitted, because the previous copy could not be read. A new copy was faxed to [employer] patient assistance foundation today.

## 2021-02-21 NOTE — Telephone Encounter (Signed)
Samantha Hinton called regarding the patient assistance application for Ozempic. They received a message that it needs to be refaxed from this office. Their number is [phone].

## 2021-03-01 ENCOUNTER — Ambulatory Visit: Payer: Medicare Other

## 2021-03-02 ENCOUNTER — Ambulatory Visit (INDEPENDENT_AMBULATORY_CARE_PROVIDER_SITE_OTHER): Payer: Medicare Other | Admitting: Family Medicine

## 2021-03-16 ENCOUNTER — Ambulatory Visit (INDEPENDENT_AMBULATORY_CARE_PROVIDER_SITE_OTHER): Payer: Medicare Other | Admitting: Family Medicine

## 2021-03-17 ENCOUNTER — Other Ambulatory Visit (INDEPENDENT_AMBULATORY_CARE_PROVIDER_SITE_OTHER): Payer: Self-pay | Admitting: Family Medicine

## 2021-03-17 ENCOUNTER — Other Ambulatory Visit: Payer: Self-pay | Admitting: Internal Medicine

## 2021-03-17 ENCOUNTER — Other Ambulatory Visit: Payer: Self-pay | Admitting: Family Medicine

## 2021-03-17 DIAGNOSIS — E559 Vitamin D deficiency, unspecified: Secondary | ICD-10-CM

## 2021-03-20 ENCOUNTER — Ambulatory Visit (INDEPENDENT_AMBULATORY_CARE_PROVIDER_SITE_OTHER): Payer: Medicare Other | Admitting: Family Medicine

## 2021-03-20 ENCOUNTER — Telehealth: Payer: Self-pay | Admitting: Pharmacist

## 2021-03-20 DIAGNOSIS — Z0289 Encounter for other administrative examinations: Secondary | ICD-10-CM

## 2021-03-20 NOTE — Telephone Encounter (Signed)
Last OV with Dr Opalski 

## 2021-03-20 NOTE — Chronic Care Management (AMB) (Signed)
Chronic Care Management Pharmacy Assistant   Name: Samantha Hinton  MRN: [id number] DOB: August 12, 1955  Reason for Encounter: Medication Review Medication Coordination   Conditions to be addressed/monitored: HTN  Recent office visits:  None  Recent consult visits:  None  Hospital visits:  None in previous 6 months  Medications: Outpatient Encounter Medications as of 03/20/2021  Medication Sig Note   albuterol (PROAIR HFA) 108 (90 Base) MCG/ACT inhaler 2 puffs every 4 hours as needed only  if your can't catch your breath    albuterol (PROVENTIL) (2.5 MG/3ML) 0.083% nebulizer solution Take 3 mLs (2.5 mg total) by nebulization every 6 (six) hours as needed for wheezing or shortness of breath.    allopurinol (ZYLOPRIM) 100 MG tablet TAKE ONE TABLET BY MOUTH TWICE DAILY    bisoprolol-hydrochlorothiazide (ZIAC) 5-6.25 MG tablet TAKE ONE TABLET BY MOUTH TWICE DAILY    BREZTRI AEROSPHERE 160-9-4.8 MCG/ACT AERO INHALE TWO PUFFS BY MOUTH INTO LUNGS TWICE DAILY    cloNIDine (CATAPRES) 0.1 MG tablet Take 1 tablet (0.1 mg total) by mouth 2 (two) times daily.    Colchicine 0.6 MG CAPS Take 0.6 mg by mouth 2 (two) times daily as needed. 07/14/2020: Using prn   gabapentin (NEURONTIN) 100 MG capsule TAKE ONE CAPSULE BY MOUTH TWICE DAILY    linaclotide (LINZESS) 145 MCG CAPS capsule Take 1 capsule (145 mcg total) by mouth daily.    loratadine (CLARITIN) 10 MG tablet Take 10 mg by mouth daily.    omeprazole (PRILOSEC) 40 MG capsule TAKE ONE TABLET BY MOUTH TWICE DAILY    ondansetron (ZOFRAN ODT) 4 MG disintegrating tablet Take 1 tablet (4 mg total) by mouth every 8 (eight) hours as needed for nausea or vomiting.    polyethylene glycol (MIRALAX / GLYCOLAX) 17 g packet Take 17 g by mouth 2 (two) times daily. Until stooling regularly    predniSONE (DELTASONE) 10 MG tablet Take  4 each am x 2 days,   2 each am x 2 days,  1 each am x 2 days and stop (Patient taking differently: Take  4 each am x 2 days,    2 each am x 2 days,  1 each am x 2 days and stop)    rosuvastatin (CRESTOR) 10 MG tablet TAKE ONE TABLET BY MOUTH EVERY EVENING    Semaglutide, 1 MG/DOSE, 4 MG/3ML SOPN Inject 1 mg as directed once a week.    venlafaxine XR (EFFEXOR XR) 75 MG 24 hr capsule Take 1 capsule (75 mg total) by mouth daily with breakfast.    Vitamin D, Ergocalciferol, (DRISDOL) 1.25 MG (50000 UNIT) CAPS capsule Take 1 capsule by mouth every 7 days.    No facility-administered encounter medications on file as of 03/20/2021.  Reviewed chart prior to disease state call. Spoke with patient regarding BP  Recent Office Vitals: BP Readings from Last 3 Encounters:  02/02/21 (!) 147/85  12/22/20 130/82  12/08/20 106/74   Pulse Readings from Last 3 Encounters:  02/02/21 64  12/22/20 94  12/08/20 95    Wt Readings from Last 3 Encounters:  02/02/21 196 lb (88.9 kg)  12/22/20 197 lb (89.4 kg)  12/08/20 196 lb (88.9 kg)     Kidney Function Lab Results  Component Value Date/Time   CREATININE 1.44 (H) 11/15/2020 09:43 AM   CREATININE 1.01 05/09/2020 10:22 AM   GFR 58.85 (L) 05/09/2020 10:22 AM   GFRNONAA >60 12/27/2014 07:57 AM   GFRAA >60 12/27/2014 07:57 AM  BMP Latest Ref Rng & Units 11/15/2020 05/09/2020 06/30/2019  Glucose 70 - 99 mg/dL 106(H) 114(H) 127(H)  BUN 8 - 27 mg/dL 26 22 14   Creatinine 0.57 - 1.00 mg/dL 1.44(H) 1.01 0.86  BUN/Creat Ratio 12 - 28 18 - -  Sodium 134 - 144 mmol/L 140 137 137  Potassium 3.5 - 5.2 mmol/L 3.9 4.5 3.9  Chloride 96 - 106 mmol/L 89(L) 93(L) 96  CO2 20 - 29 mmol/L 31(H) 33(H) 28  Calcium 8.7 - 10.3 mg/dL 10.2 9.5 9.0    Current antihypertensive regimen:  Bisoprolol- HCTZ 5-6.25 MG - take 1 tablet twice daily in the morning and with dinner   No OVs, Consults, or hospital visits since last care coordination call/Pharmacist visit.  No medication changes indicated BP Readings from Last 3 Encounters:  02/02/21 (!) 147/85  12/22/20 130/82  12/08/20 106/74    Lab  Results  Component Value Date   HGBA1C 6.0 (H) 11/15/2020     Patient obtains medications through Adherence Packaging  30 Days   Last adherence delivery included:  Rosuvastatin 10 mg - take 1 tablet with dinner Bisoprolol- HCTZ 5-6.25 MG - take 1 tablet twice daily at breakfast and dinner Omeprazole 40 mg - take 1 capsule twice daily at breakfast and dinner Gabapentin 100 mg - take 1 tablet  twice daily at breakfast and dinner Allopurinol 100 mg - take 1 tablet twice daily at breakfast and dinner Vitamin D 50,000 units - take 1 tablet once weekly (Wednesday) Venlafaxine 75 mg - take 1 tablet daily at breakfast Loratadine 10 mg - take 1 tablet daily at breakfast Clonidine 0.1mg  - take 1 tablet twice daily at breakfast and dinner Albuterol Inhaler  Was unable to reach patient to coordinate delivery  Patient is due for next adherence delivery on: 03/30/21. Called patient and reviewed medications and coordinated delivery. Confirmed Packaging for 30 DS  This delivery to include: Rosuvastatin 10 mg - take 1 tablet with dinner Bisoprolol- HCTZ 5-6.25 MG - take 1 tablet twice daily at breakfast and dinner Omeprazole 40 mg - take 1 capsule twice daily at breakfast and dinner Gabapentin 100 mg - take 1 tablet  twice daily at breakfast and dinner Allopurinol 100 mg - take 1 tablet twice daily at breakfast and dinner Vitamin D 50,000 units - take 1 tablet once weekly (Wednesday) Venlafaxine 75 mg - take 1 tablet daily at breakfast Loratadine 10 mg - take 1 tablet daily at breakfast Clonidine 0.1mg  - take 1 tablet twice daily at breakfast and dinner Breztri Inhaler    Confirmed delivery date of 03/30/21, advised patient that pharmacy will contact them the morning of delivery.    Several Attempts to coordinate medications made, unable to reach patient and have not received call back, message left that the pharmacy will attempt to deliver to her on 03/30/21 CCM Deadline 03/22/21 5 pm MP  advised.  Care Gaps: CCM- Need AWV-11/23 BP- 130/82 (12/22/20) Mammogram - Scheduled DEXA - Overdue Foot Exam - Overdue Eye Exam- Overdue Zoster Vaccine- Overdue PAP Smear- Overdue COVID Booster - Overdue Lab Results  Component Value Date   HGBA1C 6.0 (H) 11/15/2020    Star Rating Drugs: Rosuvastatin (Crestor) 10 mg - Last filled 02/24/21 30 DS at [facility] (Ozempic) - Patient Assistance   Patient Assistance: Ozempic- Re-fax completed on 02/21/21  [place] Pharmacist Assistant [phone]

## 2021-03-27 ENCOUNTER — Other Ambulatory Visit (INDEPENDENT_AMBULATORY_CARE_PROVIDER_SITE_OTHER): Payer: Self-pay | Admitting: Family Medicine

## 2021-03-27 DIAGNOSIS — E559 Vitamin D deficiency, unspecified: Secondary | ICD-10-CM

## 2021-04-13 ENCOUNTER — Other Ambulatory Visit: Payer: Self-pay

## 2021-04-13 ENCOUNTER — Ambulatory Visit (INDEPENDENT_AMBULATORY_CARE_PROVIDER_SITE_OTHER): Payer: Medicare Other | Admitting: Family Medicine

## 2021-04-13 ENCOUNTER — Encounter (INDEPENDENT_AMBULATORY_CARE_PROVIDER_SITE_OTHER): Payer: Self-pay | Admitting: Family Medicine

## 2021-04-13 VITALS — BP 135/84 | HR 79 | Temp 98.3°F | Ht 65.0 in | Wt 193.0 lb

## 2021-04-13 DIAGNOSIS — J3089 Other allergic rhinitis: Secondary | ICD-10-CM | POA: Diagnosis not present

## 2021-04-13 DIAGNOSIS — E559 Vitamin D deficiency, unspecified: Secondary | ICD-10-CM

## 2021-04-13 DIAGNOSIS — E669 Obesity, unspecified: Secondary | ICD-10-CM | POA: Diagnosis not present

## 2021-04-13 DIAGNOSIS — E1169 Type 2 diabetes mellitus with other specified complication: Secondary | ICD-10-CM | POA: Diagnosis not present

## 2021-04-13 DIAGNOSIS — Z6832 Body mass index (BMI) 32.0-32.9, adult: Secondary | ICD-10-CM

## 2021-04-13 MED ORDER — VITAMIN D (ERGOCALCIFEROL) 1.25 MG (50000 UNIT) PO CAPS: 50000.0000 [IU] | ORAL_CAPSULE | ORAL | 0 refills | Status: DC

## 2021-04-13 MED ORDER — LEVOCETIRIZINE DIHYDROCHLORIDE 5 MG PO TABS: 5.0000 mg | ORAL_TABLET | Freq: Every evening | ORAL | 0 refills | Status: DC

## 2021-04-13 MED ORDER — MONTELUKAST SODIUM 10 MG PO TABS: 10.0000 mg | ORAL_TABLET | Freq: Every day | ORAL | 0 refills | Status: DC

## 2021-04-13 MED ORDER — OZEMPIC (0.25 OR 0.5 MG/DOSE) 2 MG/1.5ML ~~LOC~~ SOPN: PEN_INJECTOR | SUBCUTANEOUS | 0 refills | Status: DC

## 2021-04-14 ENCOUNTER — Telehealth: Payer: Self-pay | Admitting: Pharmacist

## 2021-04-14 NOTE — Chronic Care Management (AMB) (Addendum)
Chronic Care Management Pharmacy Assistant   Name: Samantha Hinton  MRN: [id number] DOB: 02/02/1956  Reason for Encounter: Medication Review Medication Coordination   Recent office visits:  None  Recent consult visits:  04/14/21 Samantha Dance, DO (Fam [facility]) - Patient presented for Type 2 diabetes with other complication without long term use of insulin. Prescribed Levocetrizine Dihydrochloride 5 mg & Montelukast Sodium 10 mg. Increased Semaglutide to 2 mg/1.5 ml.  Hospital visits:  None in previous 6 months  Medications: Outpatient Encounter Medications as of 04/14/2021  Medication Sig Note   albuterol (PROAIR HFA) 108 (90 Base) MCG/ACT inhaler 2 puffs every 4 hours as needed only  if your can't catch your breath    albuterol (PROVENTIL) (2.5 MG/3ML) 0.083% nebulizer solution Take 3 mLs (2.5 mg total) by nebulization every 6 (six) hours as needed for wheezing or shortness of breath.    allopurinol (ZYLOPRIM) 100 MG tablet TAKE ONE TABLET BY MOUTH TWICE DAILY    bisoprolol-hydrochlorothiazide (ZIAC) 5-6.25 MG tablet TAKE ONE TABLET BY MOUTH TWICE DAILY    BREZTRI AEROSPHERE 160-9-4.8 MCG/ACT AERO INHALE TWO PUFFS BY MOUTH INTO LUNGS TWICE DAILY    cloNIDine (CATAPRES) 0.1 MG tablet Take 1 tablet (0.1 mg total) by mouth 2 (two) times daily.    Colchicine 0.6 MG CAPS Take 0.6 mg by mouth 2 (two) times daily as needed. 07/14/2020: Using prn   gabapentin (NEURONTIN) 100 MG capsule TAKE ONE CAPSULE BY MOUTH TWICE DAILY    levocetirizine (XYZAL) 5 MG tablet Take 1 tablet (5 mg total) by mouth every evening.    linaclotide (LINZESS) 145 MCG CAPS capsule Take 1 capsule (145 mcg total) by mouth daily.    montelukast (SINGULAIR) 10 MG tablet Take 1 tablet (10 mg total) by mouth at bedtime.    omeprazole (PRILOSEC) 40 MG capsule TAKE ONE TABLET BY MOUTH TWICE DAILY    ondansetron (ZOFRAN ODT) 4 MG disintegrating tablet Take 1 tablet (4 mg total) by mouth every 8 (eight) hours as needed for  nausea or vomiting.    polyethylene glycol (MIRALAX / GLYCOLAX) 17 g packet Take 17 g by mouth 2 (two) times daily. Until stooling regularly    predniSONE (DELTASONE) 10 MG tablet Take  4 each am x 2 days,   2 each am x 2 days,  1 each am x 2 days and stop (Patient taking differently: Take  4 each am x 2 days,   2 each am x 2 days,  1 each am x 2 days and stop)    rosuvastatin (CRESTOR) 10 MG tablet TAKE ONE TABLET BY MOUTH EVERY EVENING    Semaglutide,0.25 or 0.5MG /DOS, (OZEMPIC, 0.25 OR 0.5 MG/DOSE,) 2 MG/1.5ML SOPN Inject 0.25 mg into the skin once a week for 14 days, THEN 0.5 mg once a week.    venlafaxine XR (EFFEXOR XR) 75 MG 24 hr capsule Take 1 capsule (75 mg total) by mouth daily with breakfast.    Vitamin D, Ergocalciferol, (DRISDOL) 1.25 MG (50000 UNIT) CAPS capsule Take 1 capsule by mouth every 7 days.    No facility-administered encounter medications on file as of 04/14/2021.  Notes: Per [facility] there is a delay in obtaining specified dose of Ozempic. Call to patient to see if she wanted to wait for it to come in or if she has another pharmacy she would like to use instead. Did not reach patient left voicemail with my contact information for return call in reference to the above.  Patient returned call advises she is ok waiting on the Ozempic from [facility] and that she has also submitted an application and financial documents for Patient assistance for Ozempic for 2023 and received a notice that her application was still missing information.  Call to [place] they state that her application and resubmitted documents were received but they copy of her Social sec benefits letter is blurry and missing her husbands name on his form. Representative reports she may submit her 2023 letter or 2022 tax forms in place of what she tried to use prior. She reports she will also contact the patient via phone to advise of the above.  Care Gaps: Foot Exam - Overdue Eye Exam - Overdue Zoster  Vaccine - Overdue PAP SM - Overdue COVID Booster - Overdue DEXA - Overdue BP- 134/84 ( 04/13/21) AWV- 11/22 Lab Results  Component Value Date   HGBA1C 6.0 (H) 11/15/2020    Star Rating Drugs: Rosuvastatin (Crestor) 10 mg - Last filled 02/24/21 30 DS at [facility] Semaglutide [facility]) - Patient [place] Clinical Pharmacist Assistant [phone]

## 2021-04-17 ENCOUNTER — Other Ambulatory Visit: Payer: Self-pay | Admitting: Internal Medicine

## 2021-04-17 ENCOUNTER — Other Ambulatory Visit: Payer: Self-pay | Admitting: Family Medicine

## 2021-04-18 ENCOUNTER — Telehealth: Payer: Self-pay | Admitting: Pharmacist

## 2021-04-18 NOTE — Chronic Care Management (AMB) (Addendum)
Chronic Care Management Pharmacy Assistant   Name: Samantha Hinton  MRN: [id number] DOB: May 26, 1955  Reason for Encounter: Medication Review / Medication Coordination   Recent office visits:  None  Recent consult visits:  None   Hospital visits:  None in previous 6 months  Medications: Outpatient Encounter Medications as of 04/14/2021  Medication Sig Note   albuterol (PROAIR HFA) 108 (90 Base) MCG/ACT inhaler 2 puffs every 4 hours as needed only  if your can't catch your breath    albuterol (PROVENTIL) (2.5 MG/3ML) 0.083% nebulizer solution Take 3 mLs (2.5 mg total) by nebulization every 6 (six) hours as needed for wheezing or shortness of breath.    bisoprolol-hydrochlorothiazide (ZIAC) 5-6.25 MG tablet TAKE ONE TABLET BY MOUTH TWICE DAILY    BREZTRI AEROSPHERE 160-9-4.8 MCG/ACT AERO INHALE TWO PUFFS BY MOUTH INTO LUNGS TWICE DAILY    cloNIDine (CATAPRES) 0.1 MG tablet Take 1 tablet (0.1 mg total) by mouth 2 (two) times daily.    Colchicine 0.6 MG CAPS Take 0.6 mg by mouth 2 (two) times daily as needed. 07/14/2020: Using prn   levocetirizine (XYZAL) 5 MG tablet Take 1 tablet (5 mg total) by mouth every evening.    linaclotide (LINZESS) 145 MCG CAPS capsule Take 1 capsule (145 mcg total) by mouth daily.    montelukast (SINGULAIR) 10 MG tablet Take 1 tablet (10 mg total) by mouth at bedtime.    omeprazole (PRILOSEC) 40 MG capsule TAKE ONE TABLET BY MOUTH TWICE DAILY    ondansetron (ZOFRAN ODT) 4 MG disintegrating tablet Take 1 tablet (4 mg total) by mouth every 8 (eight) hours as needed for nausea or vomiting.    polyethylene glycol (MIRALAX / GLYCOLAX) 17 g packet Take 17 g by mouth 2 (two) times daily. Until stooling regularly    predniSONE (DELTASONE) 10 MG tablet Take  4 each am x 2 days,   2 each am x 2 days,  1 each am x 2 days and stop (Patient taking differently: Take  4 each am x 2 days,   2 each am x 2 days,  1 each am x 2 days and stop)    rosuvastatin (CRESTOR) 10 MG  tablet TAKE ONE TABLET BY MOUTH EVERY EVENING    Semaglutide,0.25 or 0.5MG /DOS, (OZEMPIC, 0.25 OR 0.5 MG/DOSE,) 2 MG/1.5ML SOPN Inject 0.25 mg into the skin once a week for 14 days, THEN 0.5 mg once a week.    venlafaxine XR (EFFEXOR XR) 75 MG 24 hr capsule Take 1 capsule (75 mg total) by mouth daily with breakfast.    Vitamin D, Ergocalciferol, (DRISDOL) 1.25 MG (50000 UNIT) CAPS capsule Take 1 capsule by mouth every 7 days.    [DISCONTINUED] allopurinol (ZYLOPRIM) 100 MG tablet TAKE ONE TABLET BY MOUTH TWICE DAILY    [DISCONTINUED] gabapentin (NEURONTIN) 100 MG capsule TAKE ONE CAPSULE BY MOUTH TWICE DAILY    No facility-administered encounter medications on file as of 04/14/2021.  Reviewed chart for medication changes ahead of medication coordination call.  No OVs, Consults, or hospital visits since last care coordination call/Pharmacist visit.  No medication changes indicated.  BP Readings from Last 3 Encounters:  04/13/21 135/84  02/02/21 (!) 147/85  12/22/20 130/82    Lab Results  Component Value Date   HGBA1C 6.0 (H) 11/15/2020     Patient obtains medications through Adherence Packaging  30 Days   Last adherence delivery included:  Rosuvastatin 10 mg - take 1 tablet with dinner Bisoprolol- HCTZ 5-6.25 MG -  take 1 tablet twice daily at breakfast and dinner Omeprazole 40 mg - take 1 capsule twice daily at breakfast and dinner Gabapentin 100 mg - take 1 capsule twice daily at breakfast and dinner Allopurinol 100 mg - take 1 tablet twice daily at breakfast and dinner Vitamin D 1250 mcg - take 1 capsule once weekly (Wednesday) Venlafaxine 75 mg - take 1 capsule daily at breakfast Loratadine 10 mg - take 1 tablet daily at breakfast Clonidine 0.1mg  - take 1 tablet twice daily at breakfast and dinner Breztri Inhaler   Patient is due for next adherence delivery on: 04/28/21. Called patient and reviewed medications and coordinated delivery. Confirmed 30 DS Packaging  This  delivery to include: Rosuvastatin 10 mg - take 1 tablet with dinner Bisoprolol- HCTZ 5-6.25 MG - take 1 tablet twice daily at breakfast and dinner Omeprazole 40 mg - take 1 capsule twice daily at breakfast and dinner Gabapentin 100 mg - take 1 capsule twice daily at breakfast and dinner Allopurinol 100 mg - take 1 tablet twice daily at breakfast and dinner Venlafaxine 75 mg - take 1 capsule daily at breakfast Levocetirizine 10 mg - take 1 tablet daily (unable to confirm timing - replaced loratadine) Clonidine 0.1mg  - take 1 tablet twice daily at breakfast and dinner Vitamin D 1250 mcg - take 1 capsule once weekly (Wednesday) Breztri Inhaler Montelukast 10 mg - take 1 tablet at bedtime  Unable to reach patient for coordination left message of delivery date of 04/28/21, advised patient that pharmacy will contact them the morning of delivery.   Care Gaps: Foot Exam - Overdue Eye Exam - Overdue Zoster Vaccine - Overdue PAP SM - Overdue COVID Booster - Overdue DEXA - Overdue BP- 134/84 ( 04/13/21) AWV- 11/22 Lab Results  Component Value Date   HGBA1C 6.0 (H) 11/15/2020    Star Rating Drugs: Rosuvastatin (Crestor) 10 mg - Last filled 02/24/21 30 DS at [facility] Semaglutide (Ozempic) - PAP in process Sent to pt 04/14/21 35 DS at [place] Pharmacist Assistant [phone]

## 2021-04-18 NOTE — Progress Notes (Signed)
Chief Complaint:   OBESITY Samantha Hinton is here to discuss her progress with her obesity treatment plan along with follow-up of her obesity related diagnoses. Samantha Hinton is on the Category 3 Plan and states she is following her eating plan approximately 75-80% of the time. Samantha Hinton states she is walking for 10 minutes 3 times per week.  Today's visit was #: 10 Starting weight: 230 lbs Starting date: 07/14/2020 Today's weight: 193 lbs Today's date: 04/13/2021 Total lbs lost to date: 37 Total lbs lost since last in-office visit: 3  Interim History: Samantha Hinton's last office visit was 02/02/2021. Had been ill and dealing with personal issues. She plans to become more active in the near future. Allergies have been bad lately, only taking 1 OTC medication Claritin daily for several years now. She notes watery eyes.  Subjective:   1. Type 2 diabetes mellitus with other specified complication, without long-term current use of insulin (Samantha Hinton) Samantha Hinton has been out of Ozempic for 30 days. She has been eating a ton. 75% following her meal plan. Not checking blood sugars, but no concerns.  2. Vitamin D deficiency Samantha Hinton is tolerating medication(s) well without side effects.  Medication compliance is good and patient appears to be taking it as prescribed.  The patient denies additional concerns regarding this condition.   3. Environmental and seasonal allergies Samantha Hinton notes increase in allergy symptoms lately with change in weather.  Assessment/Plan:  No orders of the defined types were placed in this encounter.   Medications Discontinued During This Encounter  Medication Reason   loratadine (CLARITIN) 10 MG tablet    Semaglutide, 1 MG/DOSE, 4 MG/3ML SOPN Dose change   Vitamin D, Ergocalciferol, (DRISDOL) 1.25 MG (50000 UNIT) CAPS capsule Reorder     Meds ordered this encounter  Medications   Vitamin D, Ergocalciferol, (DRISDOL) 1.25 MG (50000 UNIT) CAPS capsule    Sig: Take 1 capsule by mouth  every 7 days.    Dispense:  4 capsule    Refill:  0    Ov for rf   levocetirizine (XYZAL) 5 MG tablet    Sig: Take 1 tablet (5 mg total) by mouth every evening.    Dispense:  30 tablet    Refill:  0   montelukast (SINGULAIR) 10 MG tablet    Sig: Take 1 tablet (10 mg total) by mouth at bedtime.    Dispense:  30 tablet    Refill:  0   Semaglutide,0.25 or 0.5MG /DOS, (OZEMPIC, 0.25 OR 0.5 MG/DOSE,) 2 MG/1.5ML SOPN    Sig: Inject 0.25 mg into the skin once a week for 14 days, THEN 0.5 mg once a week.    Dispense:  1.5 mL    Refill:  0     1. Type 2 diabetes mellitus with other specified complication, without long-term current use of insulin ([facility]) We will refill Ozempic but at a lower dose than prior, 0.25 mg then 0.5 mg after 2 weeks. Slowly taper up as tolerated. I recommended she obtain BGM from her PCP if she receive it. Samantha Hinton will continue her prudent nutritional plan with decreased simple carbohydrates and increase protein.  - Semaglutide,0.25 or 0.5MG /DOS, (OZEMPIC, 0.25 OR 0.5 MG/DOSE,) 2 MG/1.5ML SOPN; Inject 0.25 mg into the skin once a week for 14 days, THEN 0.5 mg once a week.  Dispense: 1.5 mL; Refill: 0  2. Vitamin D deficiency - I again reiterated the importance of vitamin D (as well as calcium) to their health and wellbeing.  - I  reviewed possible symptoms of low Vitamin D:  low energy, depressed mood, muscle aches, joint aches, osteoporosis etc. - low Vitamin D levels may be linked to an increased risk of cardiovascular events and even increased risk of cancers- such as colon and breast.  - ideal vitamin D levels reviewed with patient  - I recommend pt take a 50,000 IU weekly prescription vit D - see script below   - Informed patient this may be a lifelong thing, and she was encouraged to continue to take the medicine until told otherwise.    - weight loss will likely improve availability of vitamin D, thus encouraged Samantha Hinton to continue with meal plan and their weight loss  efforts to further improve this condition.  Thus, we will need to monitor levels regularly (every 3-4 mo on average) to keep levels within normal limits and prevent over supplementation. - pt's questions and concerns regarding this condition addressed.  - Vitamin D, Ergocalciferol, (DRISDOL) 1.25 MG (50000 UNIT) CAPS capsule; Take 1 capsule by mouth every 7 days.  Dispense: 4 capsule; Refill: 0  3. Environmental and seasonal allergies Samantha Hinton will discontinue Claritin, and start Xyzal and Singulair especially due to her COPD and breathing issues as it may help.  - levocetirizine (XYZAL) 5 MG tablet; Take 1 tablet (5 mg total) by mouth every evening.  Dispense: 30 tablet; Refill: 0 - montelukast (SINGULAIR) 10 MG tablet; Take 1 tablet (10 mg total) by mouth at bedtime.  Dispense: 30 tablet; Refill: 0  4. Obesity with current BMI of 32.1 Samantha Hinton is currently in the action stage of change. As such, her goal is to continue with weight loss efforts. She has agreed to the Category 3 Plan.   Come in fasting for blood work at next office visit.  Exercise goals: As is.  Behavioral modification strategies: increasing lean protein intake, decreasing simple carbohydrates, meal planning and cooking strategies, and planning for success.  Samantha Hinton has agreed to follow-up with our clinic in 3 weeks. She was informed of the importance of frequent follow-up visits to maximize her success with intensive lifestyle modifications for her multiple health conditions.   Objective:   Blood pressure 135/84, pulse 79, temperature 98.3 F (36.8 C), height 5\' 5"  (1.651 m), weight 193 lb (87.5 kg), SpO2 96 %. Body mass index is 32.12 kg/m.  General: Cooperative, alert, well developed, in no acute distress. HEENT: Conjunctivae and lids unremarkable. Cardiovascular: Regular rhythm.  Lungs: Normal work of breathing. Neurologic: No focal deficits.   Lab Results  Component Value Date   CREATININE 1.44 (H) 11/15/2020    BUN 26 11/15/2020   NA 140 11/15/2020   K 3.9 11/15/2020   CL 89 (L) 11/15/2020   CO2 31 (H) 11/15/2020   Lab Results  Component Value Date   ALT 30 11/15/2020   AST 35 11/15/2020   ALKPHOS 78 11/15/2020   BILITOT 0.6 11/15/2020   Lab Results  Component Value Date   HGBA1C 6.0 (H) 11/15/2020   HGBA1C 6.2 (H) 07/14/2020   HGBA1C 6.0 05/09/2020   HGBA1C 6.9 (H) 08/04/2019   Lab Results  Component Value Date   INSULIN 6.1 07/14/2020   Lab Results  Component Value Date   TSH 3.06 05/09/2020   Lab Results  Component Value Date   CHOL 121 11/15/2020   HDL 39 (L) 11/15/2020   LDLCALC 48 11/15/2020   LDLDIRECT 242.0 05/09/2020   TRIG 215 (H) 11/15/2020   CHOLHDL 3.1 11/15/2020   Lab Results  Component  Value Date   VD25OH 57.1 11/15/2020   VD25OH 24.6 (L) 07/14/2020   Lab Results  Component Value Date   WBC 6.2 05/09/2020   HGB 13.6 05/09/2020   HCT 41.2 05/09/2020   MCV 94.1 05/09/2020   PLT 195.0 05/09/2020   Lab Results  Component Value Date   IRON 112 04/10/2019   FERRITIN 160.8 04/10/2019    Obesity Behavioral Intervention:   Approximately 15 minutes were spent on the discussion below.  ASK: We discussed the diagnosis of obesity with Samantha Hinton today and Samantha Hinton agreed to give [place] permission to discuss obesity behavioral modification therapy today.  ASSESS: Samantha Hinton has the diagnosis of obesity and her BMI today is 32.1. Samantha Hinton is in the action stage of change.   ADVISE: Samantha Hinton was educated on the multiple health risks of obesity as well as the benefit of weight loss to improve her health. She was advised of the need for long term treatment and the importance of lifestyle modifications to improve her current health and to decrease her risk of future health problems.  AGREE: Multiple dietary modification options and treatment options were discussed and Samantha Hinton agreed to follow the recommendations documented in the above note.  ARRANGE: Samantha Hinton was  educated on the importance of frequent visits to treat obesity as outlined per CMS and USPSTF guidelines and agreed to schedule her next follow up appointment today.  Attestation Statements:   Reviewed by clinician on day of visit: allergies, medications, problem list, medical history, surgical history, family history, social history, and previous encounter notes.   Wilhemena Durie, am acting as transcriptionist for [employer], DO.  I have reviewed the above documentation for accuracy and completeness, and I agree with the above. Marjory Sneddon, D.O.  The [place] was signed into law in 2016 which includes the topic of electronic health records.  This provides immediate access to information in MyChart.  This includes consultation notes, operative notes, office notes, lab results and pathology reports.  If you have any questions about what you read please let us know at your next visit so we can discuss your concerns and take corrective action if need be.  We are right here with you.

## 2021-04-24 ENCOUNTER — Telehealth (INDEPENDENT_AMBULATORY_CARE_PROVIDER_SITE_OTHER): Payer: Self-pay | Admitting: Family Medicine

## 2021-04-24 NOTE — Telephone Encounter (Signed)
Patient called regarding assistance paperwork for Ozempic 0.5 mg.  ?

## 2021-04-26 NOTE — Telephone Encounter (Signed)
Application resubmitted to [employer] PAP which special instructions to provide information about what is missing. From reviewing paperwork, it does not appear that any information is missing from the application.  ?Patient assistance program already has pertinent copies of financial income, which is why this is not attached to the application at this time.  ?Patient is aware of the steps that have been taken and has no further questions at this time.  ?

## 2021-04-27 ENCOUNTER — Telehealth (INDEPENDENT_AMBULATORY_CARE_PROVIDER_SITE_OTHER): Payer: Self-pay

## 2021-04-27 NOTE — Telephone Encounter (Signed)
Last seen by Dr Raliegh Scarlet ? ?Chasity with [facility] states that patient's prescription received is for a 35 day supply.  They need the prescription to state 30 day supply due to patients copay cost.  Please resend or advise. ? ?Thank you ?

## 2021-05-01 NOTE — Telephone Encounter (Signed)
Contacted pt's pharmacy and gave V.O. per Dr. Hershal Coria instructions to change sig to 0.'5mg'$  SQ QW, but contacted pt and instructed her to still take 0.'25mg'$  SQ QW for the first 2 weeks. Pt verbalized understanding. ?

## 2021-05-11 ENCOUNTER — Encounter (INDEPENDENT_AMBULATORY_CARE_PROVIDER_SITE_OTHER): Payer: Self-pay | Admitting: Family Medicine

## 2021-05-11 ENCOUNTER — Other Ambulatory Visit: Payer: Self-pay

## 2021-05-11 ENCOUNTER — Ambulatory Visit (INDEPENDENT_AMBULATORY_CARE_PROVIDER_SITE_OTHER): Payer: Medicare Other | Admitting: Family Medicine

## 2021-05-11 VITALS — BP 133/78 | HR 95 | Temp 97.4°F | Ht 65.0 in | Wt 203.0 lb

## 2021-05-11 DIAGNOSIS — E1169 Type 2 diabetes mellitus with other specified complication: Secondary | ICD-10-CM | POA: Diagnosis not present

## 2021-05-11 DIAGNOSIS — E785 Hyperlipidemia, unspecified: Secondary | ICD-10-CM | POA: Diagnosis not present

## 2021-05-11 DIAGNOSIS — Z7985 Long-term (current) use of injectable non-insulin antidiabetic drugs: Secondary | ICD-10-CM

## 2021-05-11 DIAGNOSIS — E559 Vitamin D deficiency, unspecified: Secondary | ICD-10-CM | POA: Diagnosis not present

## 2021-05-11 DIAGNOSIS — J3089 Other allergic rhinitis: Secondary | ICD-10-CM | POA: Diagnosis not present

## 2021-05-11 DIAGNOSIS — Z6833 Body mass index (BMI) 33.0-33.9, adult: Secondary | ICD-10-CM

## 2021-05-11 DIAGNOSIS — E669 Obesity, unspecified: Secondary | ICD-10-CM

## 2021-05-11 MED ORDER — MONTELUKAST SODIUM 10 MG PO TABS: 10.0000 mg | ORAL_TABLET | Freq: Every day | ORAL | 0 refills | Status: DC

## 2021-05-11 MED ORDER — VITAMIN D (ERGOCALCIFEROL) 1.25 MG (50000 UNIT) PO CAPS: 50000.0000 [IU] | ORAL_CAPSULE | ORAL | 0 refills | Status: DC

## 2021-05-11 MED ORDER — LEVOCETIRIZINE DIHYDROCHLORIDE 5 MG PO TABS: 5.0000 mg | ORAL_TABLET | Freq: Every evening | ORAL | 0 refills | Status: DC

## 2021-05-12 LAB — COMPREHENSIVE METABOLIC PANEL
ALT: 15 IU/L (ref 0–32)
AST: 21 IU/L (ref 0–40)
Albumin/Globulin Ratio: 1.6 (ref 1.2–2.2)
Albumin: 4.3 g/dL (ref 3.8–4.8)
Alkaline Phosphatase: 112 IU/L (ref 44–121)
BUN/Creatinine Ratio: 27 (ref 12–28)
BUN: 31 mg/dL — ABNORMAL HIGH (ref 8–27)
Bilirubin Total: 0.3 mg/dL (ref 0.0–1.2)
CO2: 27 mmol/L (ref 20–29)
Calcium: 9.9 mg/dL (ref 8.7–10.3)
Chloride: 95 mmol/L — ABNORMAL LOW (ref 96–106)
Creatinine, Ser: 1.16 mg/dL — ABNORMAL HIGH (ref 0.57–1.00)
Globulin, Total: 2.7 g/dL (ref 1.5–4.5)
Glucose: 106 mg/dL — ABNORMAL HIGH (ref 70–99)
Potassium: 4.4 mmol/L (ref 3.5–5.2)
Sodium: 141 mmol/L (ref 134–144)
Total Protein: 7 g/dL (ref 6.0–8.5)
eGFR: 52 mL/min/{1.73_m2} — ABNORMAL LOW (ref >59)

## 2021-05-12 LAB — LIPID PANEL
Chol/HDL Ratio: 3.2 ratio (ref 0.0–4.4)
Cholesterol, Total: 184 mg/dL (ref 100–199)
HDL: 58 mg/dL (ref >39)
LDL Chol Calc (NIH): 87 mg/dL (ref 0–99)
Triglycerides: 238 mg/dL — ABNORMAL HIGH (ref 0–149)
VLDL Cholesterol Cal: 39 mg/dL (ref 5–40)

## 2021-05-12 LAB — HEMOGLOBIN A1C
Est. average glucose Bld gHb Est-mCnc: 120 mg/dL
Hgb A1c MFr Bld: 5.8 % — ABNORMAL HIGH (ref 4.8–5.6)

## 2021-05-12 LAB — VITAMIN D 25 HYDROXY (VIT D DEFICIENCY, FRACTURES): Vit D, 25-Hydroxy: 63.4 ng/mL (ref 30.0–100.0)

## 2021-05-12 LAB — INSULIN, RANDOM: INSULIN: 51.4 u[IU]/mL — ABNORMAL HIGH (ref 2.6–24.9)

## 2021-05-15 IMAGING — CT CT ABD-PELV W/ CM
2 of 5 series · 14 of 46 positions shown, 16 images · IV contrast (iopamidol)
Comparison: 03/13/2016

CLINICAL DATA: Nausea, vomiting, mid to lower abdominal pain for 6
months, history of cholecystectomy, appendectomy, hysterectomy

EXAM:
CT ABDOMEN AND PELVIS WITH CONTRAST
TECHNIQUE: Multidetector CT imaging of the abdomen and pelvis was performed
using the standard protocol following bolus administration of
intravenous contrast.
CONTRAST:  125mL [id number] IOPAMIDOL ([id number]-H55) INJECTION 61%,
additional oral enteric contrast

[Series 2: abd pelvis 5.00 br40 s3 axial (person_name) · axial · 0.68mm/px · z∈[+1254,+1634]mm · 11 of 86 slices shown, 13 images]
[im 5/86  soft-tissue]
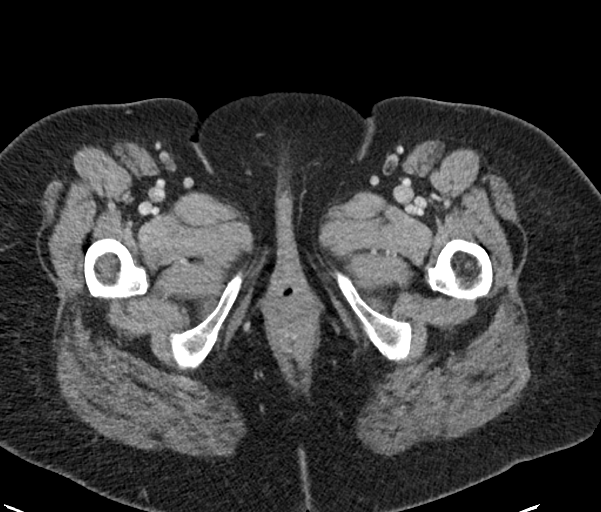
[im 5/86  bone]
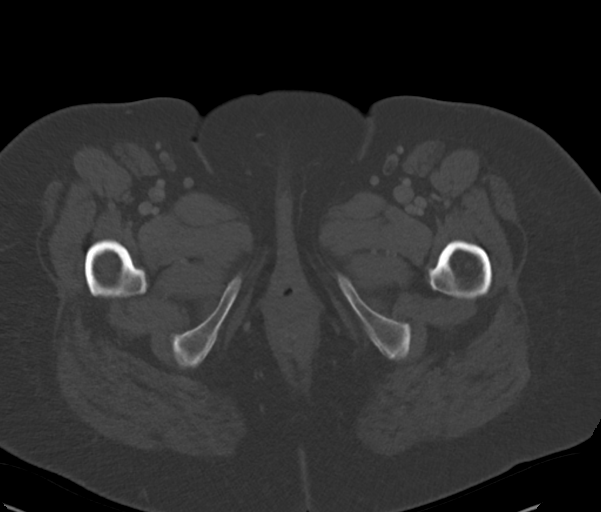
[im 14/86  soft-tissue]
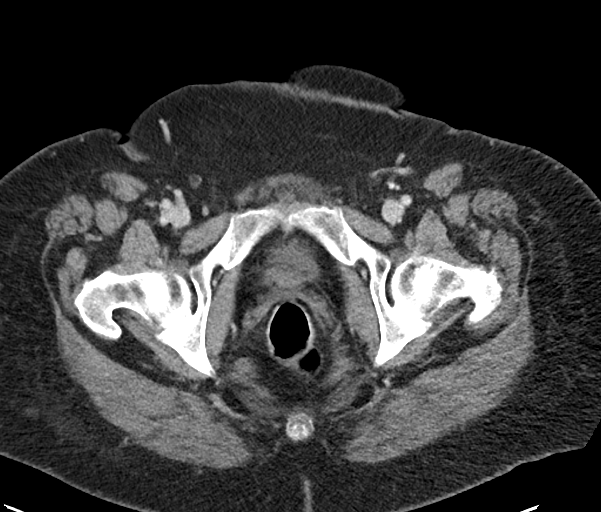
[im 23/86  soft-tissue]
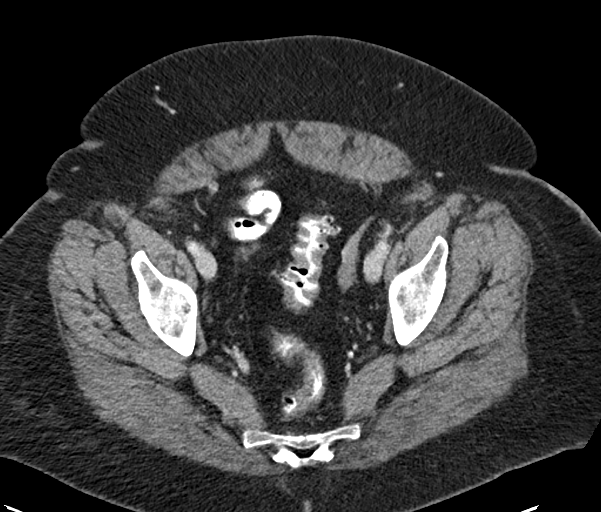
[im 27/86  soft-tissue]
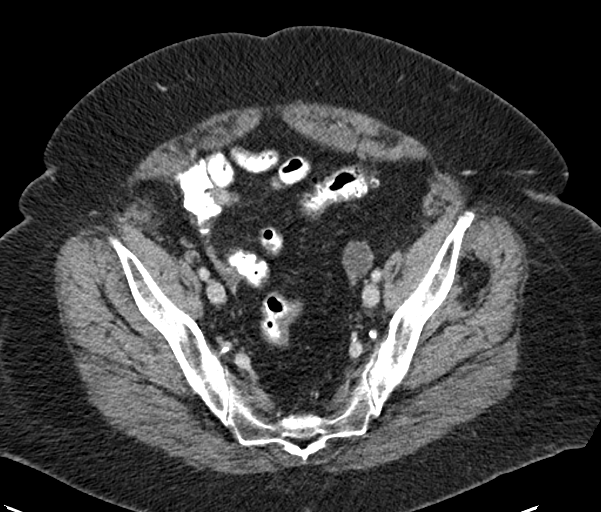
[im 36/86  soft-tissue]
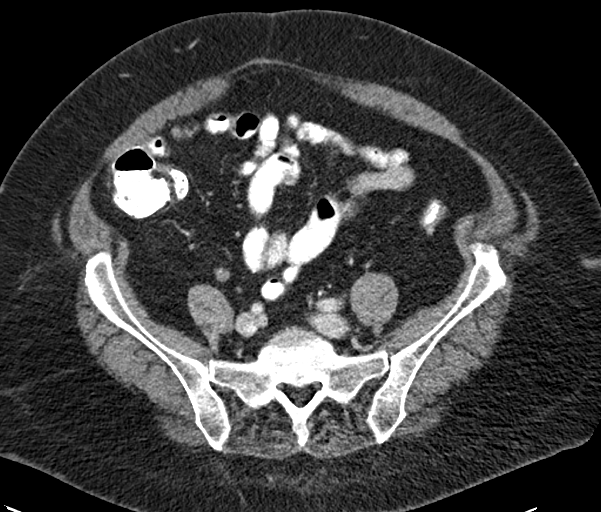
[im 45/86  soft-tissue]
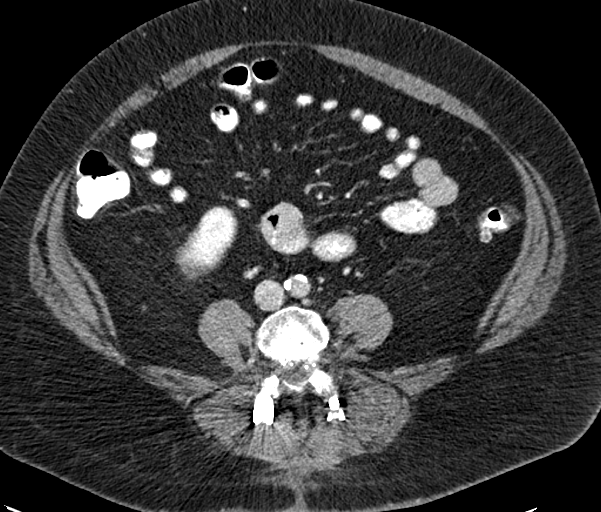
[im 50/86  soft-tissue]
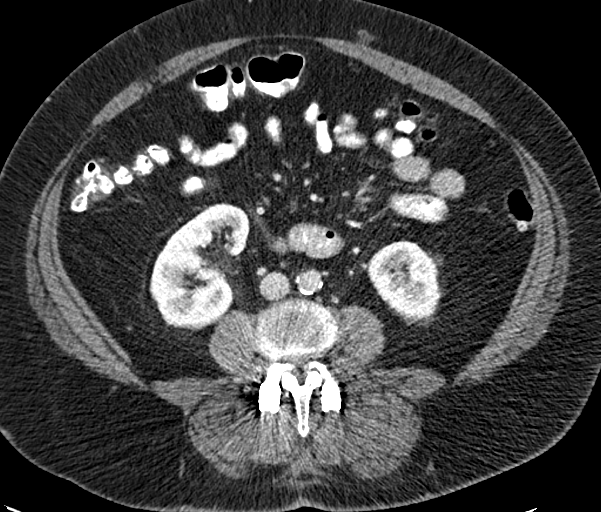
[im 59/86  soft-tissue]
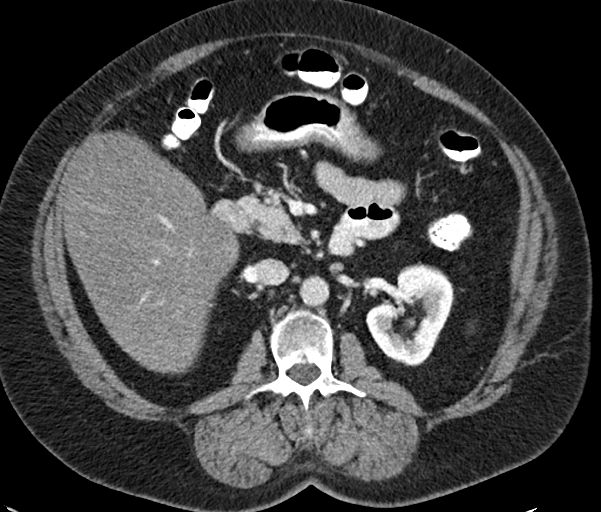
[im 63/86  soft-tissue]
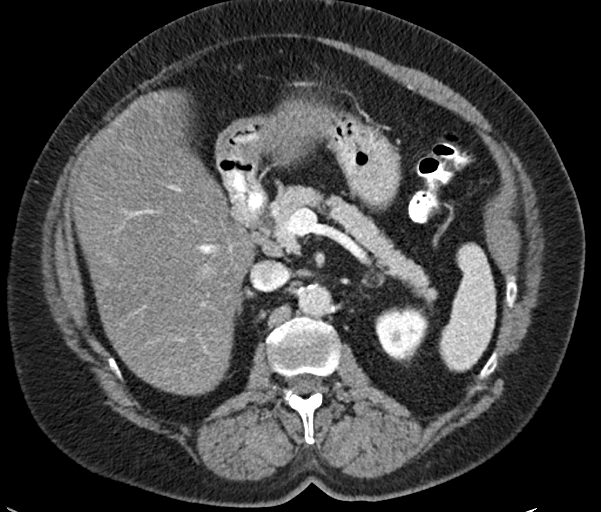
[im 63/86  bone]
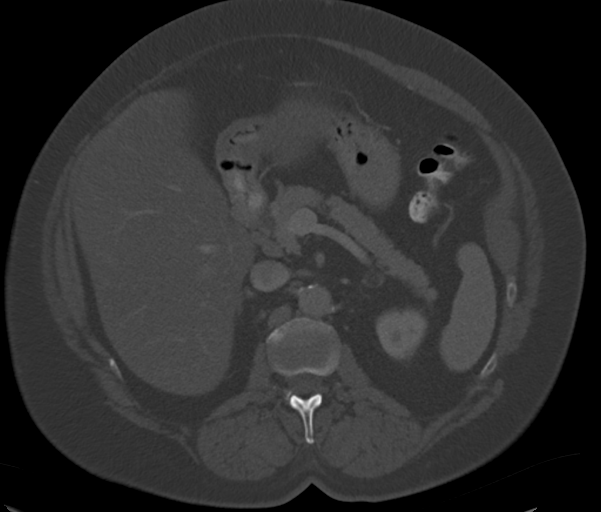
[im 72/86  soft-tissue]
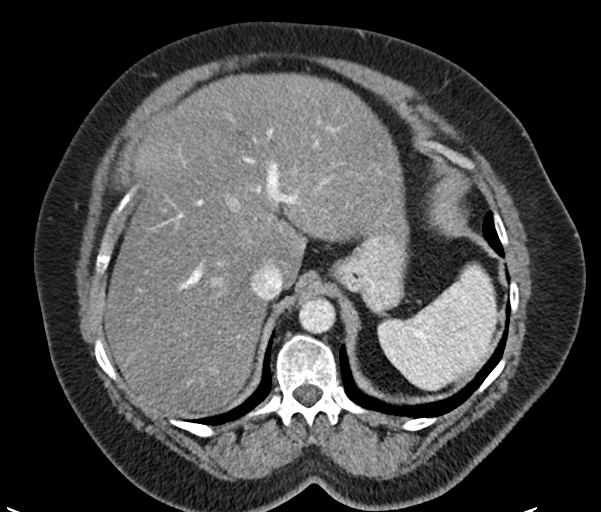
[im 81/86  soft-tissue]
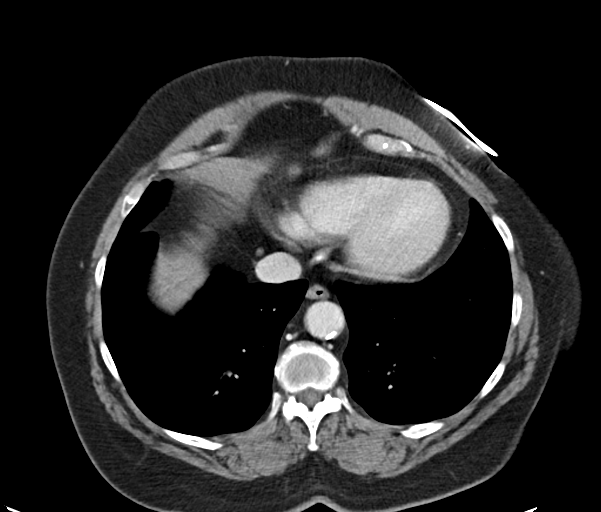

[Series 6: abd pelvis 2.00 br40 s3 (person_name) · coronal · 0.80mm/px · 3 of 173 slices shown]
[im 58/173  soft-tissue]
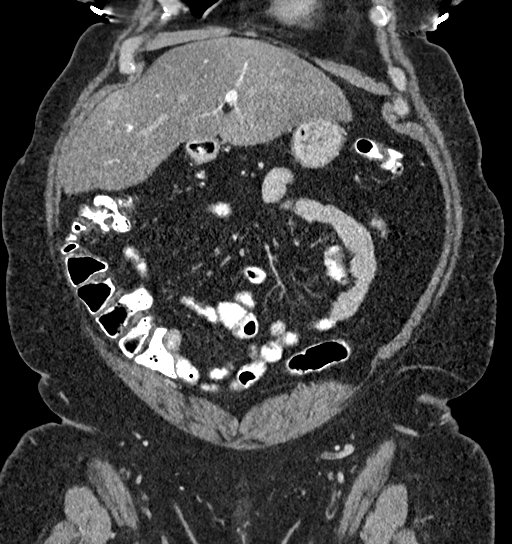
[im 77/173  soft-tissue]
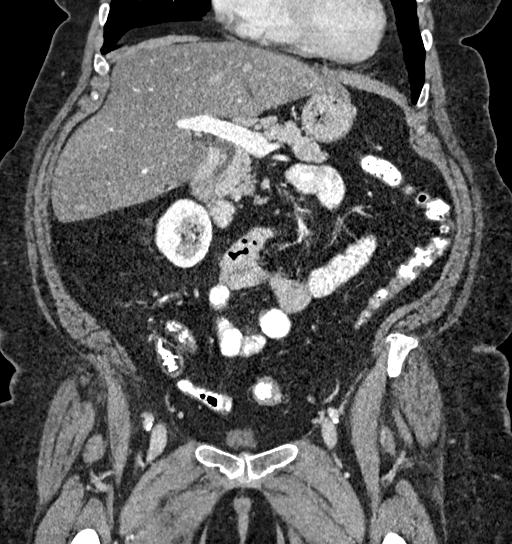
[im 96/173  soft-tissue]
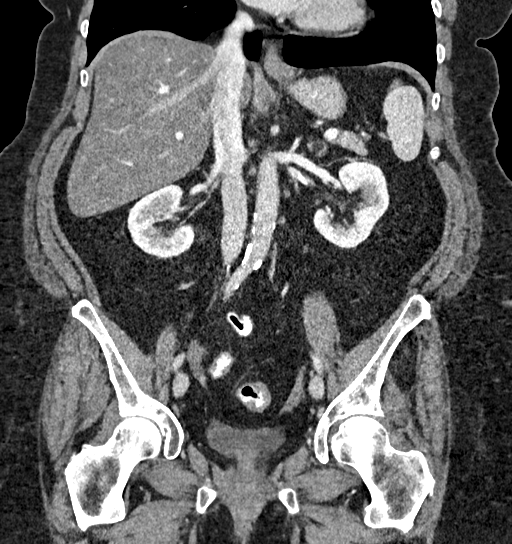

[14 of 46 positions shown; findings below may reference images not displayed]

FINDINGS: Lower chest: No acute abnormality.

Hepatobiliary: Hepatic steatosis. Status post cholecystectomy. No
biliary dilatation.

Pancreas: Unremarkable. No pancreatic ductal dilatation or
surrounding inflammatory changes.

Spleen: Normal in size without significant abnormality.

Adrenals/Urinary Tract: Small fat containing adenoma of the lateral
limb left adrenal gland. Kidneys are normal, without renal calculi,
solid lesion, or hydronephrosis. Bladder is unremarkable.

Stomach/Bowel: Stomach is within normal limits. Appendix is
surgically absent. No evidence of bowel wall thickening, distention,
or inflammatory changes. Descending and sigmoid diverticulosis.

Vascular/Lymphatic: Aortic atherosclerosis. No enlarged abdominal or
pelvic lymph nodes.

Reproductive: Status post hysterectomy. There is a 2.2 cm fluid
attenuation cyst of the left ovary, which has enlarged in comparison
to prior examination dated 03/13/2016 (series 2, image 61).

Other: No abdominal wall hernia or abnormality. No abdominopelvic
ascites.

Musculoskeletal: No acute or significant osseous findings.
IMPRESSION: 1. No acute CT findings of the abdomen or pelvis to explain pain.
2. Hepatic steatosis.
3. Descending and sigmoid diverticulosis without evidence of
diverticulitis.
4. Status post cholecystectomy, appendectomy, and hysterectomy.
5. There is a 2.2 cm fluid attenuation cyst of the left ovary, which
has enlarged in comparison to prior examination dated 03/13/2016.
Although this is very likely to be benign simple cyst, recommend
pelvic ultrasound to exclude solid component given interval
enlargement in the postmenopausal setting.
6. Aortic Atherosclerosis ([id number]-[id number].X).

## 2021-05-18 ENCOUNTER — Other Ambulatory Visit: Payer: Self-pay | Admitting: Family Medicine

## 2021-05-18 ENCOUNTER — Other Ambulatory Visit: Payer: Self-pay | Admitting: Internal Medicine

## 2021-05-18 ENCOUNTER — Telehealth: Payer: Self-pay | Admitting: Pharmacist

## 2021-05-18 NOTE — Progress Notes (Signed)
? ? ?Chronic Care Management ?Pharmacy Assistant  ? ?Name: Samantha Hinton  MRN: [id number] DOB: 04-Nov-1955 ? ?Reason for Encounter: Medication Review Medication Coordination ?  ?Recent office visits:  ?None  ? ?Recent consult visits:  ?05/11/21 Samantha Dance, DO - Patient presented for Type 2 diabetes mellitus with other specified complication without long term current use of insulin.  ? ?Hospital visits:  ?None in previous 6 months ? ?Medications: ?Outpatient Encounter Medications as of 05/18/2021  ?Medication Sig Note  ? albuterol (PROAIR HFA) 108 (90 Base) MCG/ACT inhaler 2 puffs every 4 hours as needed only  if your can't catch your breath   ? albuterol (PROVENTIL) (2.5 MG/3ML) 0.083% nebulizer solution Take 3 mLs (2.5 mg total) by nebulization every 6 (six) hours as needed for wheezing or shortness of breath.   ? allopurinol (ZYLOPRIM) 100 MG tablet TAKE ONE TABLET BY MOUTH TWICE DAILY   ? bisoprolol-hydrochlorothiazide (ZIAC) 5-6.25 MG tablet TAKE ONE TABLET BY MOUTH TWICE DAILY   ? BREZTRI AEROSPHERE 160-9-4.8 MCG/ACT AERO INHALE TWO PUFFS BY MOUTH INTO LUNGS TWICE DAILY   ? cloNIDine (CATAPRES) 0.1 MG tablet Take 1 tablet (0.1 mg total) by mouth 2 (two) times daily.   ? Colchicine 0.6 MG CAPS Take 0.6 mg by mouth 2 (two) times daily as needed. 07/14/2020: Using prn  ? gabapentin (NEURONTIN) 100 MG capsule TAKE ONE CAPSULE BY MOUTH TWICE DAILY   ? levocetirizine (XYZAL) 5 MG tablet Take 1 tablet (5 mg total) by mouth every evening.   ? linaclotide (LINZESS) 145 MCG CAPS capsule Take 1 capsule (145 mcg total) by mouth daily.   ? montelukast (SINGULAIR) 10 MG tablet Take 1 tablet (10 mg total) by mouth at bedtime.   ? omeprazole (PRILOSEC) 40 MG capsule TAKE ONE TABLET BY MOUTH TWICE DAILY   ? ondansetron (ZOFRAN ODT) 4 MG disintegrating tablet Take 1 tablet (4 mg total) by mouth every 8 (eight) hours as needed for nausea or vomiting.   ? polyethylene glycol (MIRALAX / GLYCOLAX) 17 g packet Take 17 g by mouth  2 (two) times daily. Until stooling regularly   ? predniSONE (DELTASONE) 10 MG tablet Take  4 each am x 2 days,   2 each am x 2 days,  1 each am x 2 days and stop (Patient taking differently: Take  4 each am x 2 days,   2 each am x 2 days,  1 each am x 2 days and stop)   ? rosuvastatin (CRESTOR) 10 MG tablet TAKE ONE TABLET BY MOUTH EVERY EVENING   ? venlafaxine XR (EFFEXOR XR) 75 MG 24 hr capsule Take 1 capsule (75 mg total) by mouth daily with breakfast.   ? Vitamin D, Ergocalciferol, (DRISDOL) 1.25 MG (50000 UNIT) CAPS capsule Take 1 capsule by mouth every 7 days.   ? ?No facility-administered encounter medications on file as of 05/18/2021.  ? ?Reviewed chart for medication changes ahead of medication coordination call. ? ?No OVs, Consults, or hospital visits since last care coordination call/Pharmacist visit.  ? ?No medication changes indicated  ?BP Readings from Last 3 Encounters:  ?05/11/21 133/78  ?04/13/21 135/84  ?02/02/21 (!) 147/85  ?  ?Lab Results  ?Component Value Date  ? HGBA1C 5.8 (H) 05/11/2021  ?  ? ?Patient obtains medications through Adherence Packaging  30 Days  ? ?Last adherence delivery included:  ?Rosuvastatin 10 mg - take 1 tablet with dinner ?Bisoprolol- HCTZ 5-6.25 MG - take 1 tablet twice daily at breakfast and dinner ?  Omeprazole 40 mg - take 1 capsule twice daily at breakfast and dinner ?Gabapentin 100 mg - take 1 capsule twice daily at breakfast and dinner ?Allopurinol 100 mg - take 1 tablet twice daily at breakfast and dinner ?Venlafaxine 75 mg - take 1 capsule daily at breakfast ?Levocetirizine 10 mg - take 1 tablet daily (unable to confirm timing - replaced loratadine) ?Clonidine 0.'1mg'$  - take 1 tablet twice daily at breakfast and dinner ?Vitamin D 1250 mcg - take 1 capsule once weekly (Wednesday) ?Breztri Inhaler ?Montelukast 10 mg - take 1 tablet at bedtime ?  ?Patient is due for next adherence delivery on: 05/30/21. ?Called patient and reviewed medications and coordinated  delivery. ? ?This delivery to include: ?Levocetirizine 10 mg - take 1 tablet daily at dinner ?Montelukast 10 mg - take 1 tablet at bedtime ? Omeprazole 40 mg - take 1 capsule twice daily at breakfast and dinner ?Clonidine 0.'1mg'$  - take 1 tablet twice daily at breakfast and dinner ?Allopurinol 100 mg - take 1 tablet twice daily at breakfast and dinner ?Rosuvastatin 10 mg - take 1 tablet with dinner ?Vitamin D 1250 mcg - take 1 capsule once weekly (Wednesday ?Ozempic 0.5 mg : Inject 0.5 mg once weekly ?Breztri Inhaler ?Venlafaxine 75 mg - take 1 capsule daily at breakfast ?Bisoprolol- HCTZ 5-6.25 MG - take 1 tablet twice daily at breakfast and dinner ?Gabapentin 100 mg - take 1 capsule twice daily at breakfast and dinner ? ? ?Unable to reach to Confirm delivery date of 05/30/21, left voicemail to advise patient that pharmacy will contact them the morning of delivery.  ? ?Care Gaps: ?Foot Exam - Overdue ?Eye Exam - Overdue ?Zoster Vaccine - Overdue ?PAP SM - Overdue ?Mammogram - Overdue ?COVID Booster - Overdue ?DEXA - Overdue ?BP- 133/78 (05/11/21) ?AWV- 11/22 ?Lab Results  ?Component Value Date  ? HGBA1C 5.8 (H) 05/11/2021  ? ? ?Star Rating Drugs: ?Rosuvastatin (Crestor) 10 mg - Last filled 04/24/21 30 DS at [facility] ?Semaglutide (Ozempic) - Sent to pt 04/28/21 35 DS at [facility] ? ? ?Samantha Hinton CMA ?Clinical Pharmacist Assistant ?[phone] ? ?

## 2021-05-22 NOTE — Progress Notes (Signed)
? ? ? ?Chief Complaint:  ? ?OBESITY ?Samantha Hinton is here to discuss her progress with her obesity treatment plan along with follow-up of her obesity related diagnoses. Samantha Hinton is on the Category 3 Plan and states she is following her eating plan approximately 80% of the time. Samantha Hinton states she is not exercising regularly at this time. ? ?Today's visit was #: 67 ?Starting weight: 230 lbs ?Starting date: 07/14/2020 ?Today's weight: 203 lbs ?Today's date: 05/11/2021 ?Total lbs lost to date: 27 lbs ?Total lbs lost since last in-office visit: 0 ? ?Interim History: Samantha Hinton has been eating off plan "a lot".  She has been back on Ozempic for 2 weeks, which is helping.  She is worried about the cost of medications for the future (once she hits the donut hole, she cannot afford it).  No issues with meal plan, "I just need to follow it". ? ?Subjective:  ? ?1. Type 2 diabetes mellitus with other specified complication, without long-term current use of insulin ([place]) ?Tolerating Ozempic well.  No symptoms or concerns.  She is not checking her blood sugars. ? ?2. Vitamin D deficiency ?She is currently taking prescription vitamin D 50,000 IU each week. She denies nausea, vomiting or muscle weakness. ? ?3. Environmental and seasonal allergies ?Started Xyzal and Singulair at last office visit.  Working great and breathing and allergies much better now. ? ?4. Hyperlipidemia associated with type 2 diabetes mellitus ([place]) ?Samantha Hinton is taking Crestor.  Samantha Hinton is tolerating medication(s) well without side effects.  Medication compliance is good and patient appears to be taking it as prescribed.  Denies additional concerns regarding this condition.  ? ?Assessment/Plan:  ? ?Orders Placed This Encounter  ?Procedures  ? Comprehensive metabolic panel  ? Hemoglobin A1c  ? Insulin, random  ? VITAMIN D 25 Hydroxy (Vit-D Deficiency, Fractures)  ? Lipid panel  ? ? ?Medications Discontinued During This Encounter  ?Medication Reason  ?  Semaglutide,0.25 or 0.'5MG'$ /DOS, (OZEMPIC, 0.25 OR 0.5 MG/DOSE,) 2 MG/1.5ML SOPN   ? Vitamin D, Ergocalciferol, (DRISDOL) 1.25 MG (50000 UNIT) CAPS capsule Reorder  ? levocetirizine (XYZAL) 5 MG tablet Reorder  ? montelukast (SINGULAIR) 10 MG tablet Reorder  ?  ? ?Meds ordered this encounter  ?Medications  ? levocetirizine (XYZAL) 5 MG tablet  ?  Sig: Take 1 tablet (5 mg total) by mouth every evening.  ?  Dispense:  30 tablet  ?  Refill:  0  ? montelukast (SINGULAIR) 10 MG tablet  ?  Sig: Take 1 tablet (10 mg total) by mouth at bedtime.  ?  Dispense:  30 tablet  ?  Refill:  0  ? Vitamin D, Ergocalciferol, (DRISDOL) 1.25 MG (50000 UNIT) CAPS capsule  ?  Sig: Take 1 capsule by mouth every 7 days.  ?  Dispense:  4 capsule  ?  Refill:  0  ?  Ov for rf  ?  ? ?1. Type 2 diabetes mellitus with other specified complication, without long-term current use of insulin ([place]) ?Continue Ozempic - she will start 0.5 mg next week.  Decrease simple carbs and follow meal plan.  Check labs - check CMP, FLP, A1c, IC. ? ?- Comprehensive metabolic panel ?- Hemoglobin A1c ?- Insulin, random ? ?2. Vitamin D deficiency ?Check vitamin D level today and refill ergocalciferol. ? ?- Refill Vitamin D, Ergocalciferol, (DRISDOL) 1.25 MG (50000 UNIT) CAPS capsule; Take 1 capsule by mouth every 7 days.  Dispense: 4 capsule; Refill: 0 ?- VITAMIN D 25 Hydroxy (Vit-D Deficiency, Fractures) ? ?  3. Environmental and seasonal allergies ?Refill Singulair. ?Refill Xyzal. ? ?- Refill levocetirizine (XYZAL) 5 MG tablet; Take 1 tablet (5 mg total) by mouth every evening.  Dispense: 30 tablet; Refill: 0 ?- Refill montelukast (SINGULAIR) 10 MG tablet; Take 1 tablet (10 mg total) by mouth at bedtime.  Dispense: 30 tablet; Refill: 0 ? ?4. Hyperlipidemia associated with type 2 diabetes mellitus ([place]) ?Check FLP today. ? ?- Lipid panel ? ?5. Obesity with current BMI of 33.8 ? ?Samantha Hinton is currently in the action stage of change. As such, her goal is to continue with  weight loss efforts. She has agreed to the Category 3 Plan.  ? ?Exercise goals: Older adults should follow the adult guidelines. When older adults cannot meet the adult guidelines, they should be as physically active as their abilities and conditions will allow.  ? ?Behavioral modification strategies: meal planning and cooking strategies. ? ?Samantha Hinton has agreed to follow-up with our clinic in 3 weeks. She was informed of the importance of frequent follow-up visits to maximize her success with intensive lifestyle modifications for her multiple health conditions.  ? ?Samantha Hinton was informed we would discuss her lab results at her next visit unless there is a critical issue that needs to be addressed sooner. Samantha Hinton agreed to keep her next visit at the agreed upon time to discuss these results. ? ?Objective:  ? ?Blood pressure 133/78, pulse 95, temperature (!) 97.4 ?F (36.3 ?C), height '5\' 5"'$  (1.651 m), weight 203 lb (92.1 kg), SpO2 96 %. ?Body mass index is 33.78 kg/m?. ? ?General: Cooperative, alert, well developed, in no acute distress. ?HEENT: Conjunctivae and lids unremarkable. ?Cardiovascular: Regular rhythm.  ?Lungs: Normal work of breathing. ?Neurologic: No focal deficits.  ? ?Lab Results  ?Component Value Date  ? CREATININE 1.16 (H) 05/11/2021  ? BUN 31 (H) 05/11/2021  ? NA 141 05/11/2021  ? K 4.4 05/11/2021  ? CL 95 (L) 05/11/2021  ? CO2 27 05/11/2021  ? ?Lab Results  ?Component Value Date  ? ALT 15 05/11/2021  ? AST 21 05/11/2021  ? ALKPHOS 112 05/11/2021  ? BILITOT 0.3 05/11/2021  ? ?Lab Results  ?Component Value Date  ? HGBA1C 5.8 (H) 05/11/2021  ? HGBA1C 6.0 (H) 11/15/2020  ? HGBA1C 6.2 (H) 07/14/2020  ? HGBA1C 6.0 05/09/2020  ? HGBA1C 6.9 (H) 08/04/2019  ? ?Lab Results  ?Component Value Date  ? INSULIN 51.4 (H) 05/11/2021  ? INSULIN 6.1 07/14/2020  ? ?Lab Results  ?Component Value Date  ? TSH 3.06 05/09/2020  ? ?Lab Results  ?Component Value Date  ? CHOL 184 05/11/2021  ? HDL 58 05/11/2021  ? [place] 87  05/11/2021  ? LDLDIRECT 242.0 05/09/2020  ? TRIG 238 (H) 05/11/2021  ? CHOLHDL 3.2 05/11/2021  ? ?Lab Results  ?Component Value Date  ? VD25OH 63.4 05/11/2021  ? VD25OH 57.1 11/15/2020  ? VD25OH 24.6 (L) 07/14/2020  ? ?Lab Results  ?Component Value Date  ? WBC 6.2 05/09/2020  ? HGB 13.6 05/09/2020  ? HCT 41.2 05/09/2020  ? MCV 94.1 05/09/2020  ? PLT 195.0 05/09/2020  ? ?Lab Results  ?Component Value Date  ? IRON 112 04/10/2019  ? FERRITIN 160.8 04/10/2019  ? ?Obesity Behavioral Intervention:  ? ?Approximately 15 minutes were spent on the discussion below. ? ?ASK: ?We discussed the diagnosis of obesity with Samantha Hinton today and Samantha Hinton agreed to give [place] permission to discuss obesity behavioral modification therapy today. ? ?ASSESS: ?Samantha Hinton has the diagnosis of obesity and her BMI today is  33.8. Samantha Hinton is in the action stage of change.  ? ?ADVISE: ?Samantha Hinton was educated on the multiple health risks of obesity as well as the benefit of weight loss to improve her health. She was advised of the need for long term treatment and the importance of lifestyle modifications to improve her current health and to decrease her risk of future health problems. ? ?AGREE: ?Multiple dietary modification options and treatment options were discussed and Samantha Hinton agreed to follow the recommendations documented in the above note. ? ?ARRANGE: ?Samantha Hinton was educated on the importance of frequent visits to treat obesity as outlined per CMS and USPSTF guidelines and agreed to schedule her next follow up appointment today. ? ?Attestation Statements:  ? ?Reviewed by clinician on day of visit: allergies, medications, problem list, medical history, surgical history, family history, social history, and previous encounter notes. ? ?I, Water quality scientist, CMA, am acting as transcriptionist for [employer], DO. ? ?I have reviewed the above documentation for accuracy and completeness, and I agree with the above. Samantha Hinton, D.O. ? ?The [place] was signed into law in 2016 which includes the topic of electronic health records.  This provides immediate access to information in MyChart.  This includes consultation notes, operative notes, office notes,

## 2021-05-29 ENCOUNTER — Other Ambulatory Visit: Payer: Self-pay | Admitting: Internal Medicine

## 2021-05-29 MED ORDER — BISOPROLOL-HYDROCHLOROTHIAZIDE 5-6.25 MG PO TABS
1.0000 | ORAL_TABLET | Freq: Two times a day (BID) | ORAL | 0 refills | Status: DC
Start: 2021-05-29 — End: 2021-07-04

## 2021-05-31 ENCOUNTER — Telehealth: Payer: Self-pay | Admitting: Family Medicine

## 2021-05-31 ENCOUNTER — Other Ambulatory Visit: Payer: Self-pay

## 2021-05-31 MED ORDER — GABAPENTIN 100 MG PO CAPS: 200.0000 mg | ORAL_CAPSULE | Freq: Every day | ORAL | 0 refills | Status: DC

## 2021-05-31 MED ORDER — VENLAFAXINE HCL ER 75 MG PO CP24: 75.0000 mg | ORAL_CAPSULE | Freq: Every day | ORAL | 0 refills | Status: DC

## 2021-05-31 NOTE — Telephone Encounter (Signed)
Pt needs refills for Effexor and Gabapentin, has been without for 5 days. ? ?[facility]. ? ?Appt scheduled for 4/25, informed pt we may only provide 1 month refill until appt. ?

## 2021-05-31 NOTE — Telephone Encounter (Signed)
Pt informed of refill. °

## 2021-06-01 ENCOUNTER — Ambulatory Visit (INDEPENDENT_AMBULATORY_CARE_PROVIDER_SITE_OTHER): Payer: Medicare Other | Admitting: Family Medicine

## 2021-06-01 VITALS — BP 120/76 | HR 100 | Temp 98.2°F | Ht 65.0 in | Wt 201.0 lb

## 2021-06-01 DIAGNOSIS — E559 Vitamin D deficiency, unspecified: Secondary | ICD-10-CM

## 2021-06-01 DIAGNOSIS — E1169 Type 2 diabetes mellitus with other specified complication: Secondary | ICD-10-CM

## 2021-06-01 DIAGNOSIS — Z6839 Body mass index (BMI) 39.0-39.9, adult: Secondary | ICD-10-CM | POA: Diagnosis not present

## 2021-06-01 DIAGNOSIS — J3089 Other allergic rhinitis: Secondary | ICD-10-CM

## 2021-06-01 MED ORDER — LEVOCETIRIZINE DIHYDROCHLORIDE 5 MG PO TABS: 5.0000 mg | ORAL_TABLET | Freq: Every evening | ORAL | 0 refills | Status: DC

## 2021-06-01 MED ORDER — VITAMIN D (ERGOCALCIFEROL) 1.25 MG (50000 UNIT) PO CAPS: 50000.0000 [IU] | ORAL_CAPSULE | ORAL | 0 refills | Status: DC

## 2021-06-01 MED ORDER — OZEMPIC (0.25 OR 0.5 MG/DOSE) 2 MG/3ML ~~LOC~~ SOPN: 0.5000 mg | PEN_INJECTOR | SUBCUTANEOUS | 0 refills | Status: DC

## 2021-06-01 MED ORDER — MONTELUKAST SODIUM 10 MG PO TABS: 10.0000 mg | ORAL_TABLET | Freq: Every day | ORAL | 0 refills | Status: DC

## 2021-06-09 NOTE — Progress Notes (Signed)
?Samantha Hinton D.O. ?[facility] ?Samantha Hinton ?Phone: [phone] ?Subjective:   ?I, Samantha Hinton, am serving as a Education administrator for Dr. Hulan Hinton. ?This visit occurred during the SARS-CoV-2 public health emergency.  Safety protocols were in place, including screening questions prior to the visit, additional usage of staff PPE, and extensive cleaning of exam room while observing appropriate contact time as indicated for disinfecting solutions.  ? ?I'm seeing this patient by the request  of:  Hinton, Samantha Brooking, MD ? ?CC: neck pain  ? ?[id number]  ?Last seen April 2022 ? ?Samantha Hinton is a 66 y.o. female coming in with complaint of same pains from a year ago. Here for medication refill. Neck and back pains. On ozempic. No new complaints. ?Neck and back pain continues.  Known arthritic changes.  Has done very well though with Effexor and the gabapentin. ?Patient has had multiple neck surgeries as well and would like a referral to neurosurgery. ? ? ?  ? ?Past Medical History:  ?Diagnosis Date  ? Anxiety   ? Asthma   ? Chronic back pain   ? buldging disc and tumor.Spondylolisthesis  ? COPD (chronic obstructive pulmonary disease) ([place])   ? Symbicort daily and ProAir as needed  ? Depression   ? Emphysema   ? Emphysema of lung ([place])   ? Fatty liver 04/13/2019  ? Fatty liver   ? Fibromyalgia   ? Gallbladder problem   ? Genital warts   ? GERD (gastroesophageal reflux disease)   ? takes Omeprazole daily  ? Heartburn   ? History of bronchitis 01/2014  ? History of colon polyps   ? Hyperlipidemia   ? has been off of meds d/t getting sick from them.Not been addressed again.   ? Hypertension   ? takes Ziac daily  ? Nocturia   ? Pneumonia   ? hx of-8+yrs ago  ? PONV (postoperative nausea and vomiting)   ? Shortness of breath dyspnea   ? thinks d/t pain meds and notices with exertion  ? SOBOE (shortness of breath on exertion)   ? Stroke [facility])   ? 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 Swallowing difficulty    ? ?Past Surgical History:  ?Procedure Laterality Date  ? ABDOMINAL HYSTERECTOMY  1983  ? partial   ? ADENOIDECTOMY    ? ANTERIOR (CYSTOCELE) AND POSTERIOR REPAIR (RECTOCELE) WITH XENFORM GRAFT AND SACROSPINOUS FIXATION    ? APPENDECTOMY  1967  ? BACK SURGERY  2017  ? lower back  ? BREAST SURGERY Right   ? diseased milk glands  ? CHOLECYSTECTOMY  1983  ? COLONOSCOPY  04/13/2019  ? DIAGNOSTIC LAPAROSCOPY    ? cyst removed from ovaries   ? ESOPHAGOGASTRODUODENOSCOPY    ? lypoma  2011  ? NECK SURGERY  [phone]  ? c spine ant and post hx fusion and removal or harcdware and shavings  ? POLYPECTOMY    ? rods and screws in lumbar    ? TONSILLECTOMY    ? UPPER GASTROINTESTINAL ENDOSCOPY  04/13/2019  ? ?Social History  ? ?Socioeconomic History  ? Marital status: Significant Other  ?  Spouse name: Samantha Hinton  ? Number of children: 2  ? Years of education: Not on file  ? Highest education level: Not on file  ?Occupational History  ? Occupation: retired  ?  Employer: Samantha Hinton  ?Tobacco Use  ? Smoking status: Former  ?  Packs/day: 0.25  ?  Years: 44.00  ?  Pack years: 11.00  ?  Types: Cigarettes  ?  Quit date: 02/04/2014  ?  Years since quitting: 7.3  ? Smokeless tobacco: Never  ? Tobacco comments:  ?  quit smoking in Dec 2015  ?Vaping Use  ? Vaping Use: Former  ?Substance and Sexual Activity  ? Alcohol use: Yes  ?  Alcohol/week: 4.0 standard drinks  ?  Types: 4 Standard drinks or equivalent per week  ?  Comment: couple times a week  ? Drug use: No  ? Sexual activity: Not Currently  ?  Birth control/protection: Surgical  ?  Comment: 1st intercourse 66 yo-Fewer than 5 partners  ?Other Topics Concern  ? Not on file  ?Social History Narrative  ? 6-8 hours of sleep per night  ? Lives with her fiance hh of 2 no pets   ? 1 dog in the home  ? Retired no ets  But tob 8 per day  ? etoh ocass 1-2   ? On disability from her neck surgery predicaments .  ? orig from [place]   ? Brother in [place] area.   ? 12+ years of education  widowed retired gravida 2 para 2  ? Last Pap 2006 last mammogram 2000 and  ? Has dentures   FA   ? ?Social Determinants of Health  ? ?Financial Resource Strain: Medium Risk  ? Difficulty of Paying Living Expenses: Somewhat hard  ?Food Insecurity: No Food Insecurity  ? Worried About Charity fundraiser in the Last Year: Never true  ? Ran Out of Food in the Last Year: Never true  ?Transportation Needs: No Transportation Needs  ? Lack of Transportation (Medical): No  ? Lack of Transportation (Non-Medical): No  ?Physical Activity: Inactive  ? Days of Exercise per Week: 0 days  ? Minutes of Exercise per Session: 0 min  ?Stress: No Stress Concern Present  ? Feeling of Stress : Only a little  ?Social Connections: Socially Isolated  ? Frequency of Communication with Friends and Family: Twice a week  ? Frequency of Social Gatherings with Friends and Family: Three times a week  ? Attends Religious Services: Never  ? Active Member of Clubs or Organizations: No  ? Attends Archivist Meetings: Never  ? Marital Status: Divorced  ? ?Allergies  ?Allergen Reactions  ? Wellbutrin [Bupropion] Nausea And Vomiting  ? ?Family History  ?Problem Relation Age of Onset  ? CAD Father   ?     tumor between heart and lung   ? Hypertension Father   ? Cancer Father   ? CAD Sister   ?     ? dx   ? Hypertension Mother   ? Lung cancer Mother   ?     died 61   ? Diabetes Maternal Grandmother   ?     late 59s   ? Colon cancer Neg Hx   ? Colon polyps Neg Hx   ? Esophageal cancer Neg Hx   ? Rectal cancer Neg Hx   ? Stomach cancer Neg Hx   ? ? ?Current Outpatient Medications (Endocrine & Metabolic):  ?  OZEMPIC, 0.25 OR 0.5 MG/DOSE, 2 MG/3ML SOPN, Inject 0.5 mg into the skin once a week. ?  predniSONE (DELTASONE) 10 MG tablet, Take  4 each am x 2 days,   2 each am x 2 days,  1 each am x 2 days and stop (Patient taking differently: Take  4 each am x 2 days,   2 each  am x 2 days,  1 each am x 2 days and stop) ? ?Current Outpatient Medications  (Cardiovascular):  ?  bisoprolol-hydrochlorothiazide (ZIAC) 5-6.25 MG tablet, Take 1 tablet by mouth 2 (two) times daily. ?  cloNIDine (CATAPRES) 0.1 MG tablet, Take 1 tablet (0.1 mg total) by mouth 2 (two) times daily. ?  rosuvastatin (CRESTOR) 10 MG tablet, TAKE ONE TABLET BY MOUTH EVERY EVENING ? ?Current Outpatient Medications (Respiratory):  ?  albuterol (PROAIR HFA) 108 (90 Base) MCG/ACT inhaler, 2 puffs every 4 hours as needed only  if your can't catch your breath ?  albuterol (PROVENTIL) (2.5 MG/3ML) 0.083% nebulizer solution, Take 3 mLs (2.5 mg total) by nebulization every 6 (six) hours as needed for wheezing or shortness of breath. ?  BREZTRI AEROSPHERE 160-9-4.8 MCG/ACT AERO, INHALE TWO PUFFS BY MOUTH INTO LUNGS TWICE DAILY ?  levocetirizine (XYZAL) 5 MG tablet, Take 1 tablet (5 mg total) by mouth every evening. ?  montelukast (SINGULAIR) 10 MG tablet, Take 1 tablet (10 mg total) by mouth at bedtime. ? ?Current Outpatient Medications (Analgesics):  ?  allopurinol (ZYLOPRIM) 100 MG tablet, TAKE ONE TABLET BY MOUTH TWICE DAILY ?  Colchicine 0.6 MG CAPS, Take 0.6 mg by mouth 2 (two) times daily as needed. ? ? ?Current Outpatient Medications (Other):  ?  gabapentin (NEURONTIN) 100 MG capsule, Take 2 capsules (200 mg total) by mouth 3 (three) times daily. ?  linaclotide (LINZESS) 145 MCG CAPS capsule, Take 1 capsule (145 mcg total) by mouth daily. ?  omeprazole (PRILOSEC) 40 MG capsule, TAKE ONE TABLET BY MOUTH TWICE DAILY ?  ondansetron (ZOFRAN ODT) 4 MG disintegrating tablet, Take 1 tablet (4 mg total) by mouth every 8 (eight) hours as needed for nausea or vomiting. ?  polyethylene glycol (MIRALAX / GLYCOLAX) 17 g packet, Take 17 g by mouth 2 (two) times daily. Until stooling regularly ?  venlafaxine XR (EFFEXOR XR) 75 MG 24 hr capsule, Take 1 capsule (75 mg total) by mouth daily with breakfast. ?  Vitamin D, Ergocalciferol, (DRISDOL) 1.25 MG (50000 UNIT) CAPS capsule, Take 1 capsule by mouth every 7  days. ? ? ?Reviewed prior external information including notes and imaging from  ?primary care provider ?As well as notes that were available from care everywhere and other healthcare systems. ? ?Past medical hi

## 2021-06-12 ENCOUNTER — Ambulatory Visit: Payer: Medicare Other | Admitting: Family Medicine

## 2021-06-12 VITALS — BP 130/90 | HR 93 | Ht 65.0 in | Wt 203.0 lb

## 2021-06-12 DIAGNOSIS — Z9889 Other specified postprocedural states: Secondary | ICD-10-CM | POA: Diagnosis not present

## 2021-06-12 DIAGNOSIS — M542 Cervicalgia: Secondary | ICD-10-CM

## 2021-06-12 DIAGNOSIS — M4316 Spondylolisthesis, lumbar region: Secondary | ICD-10-CM

## 2021-06-12 MED ORDER — GABAPENTIN 100 MG PO CAPS: 200.0000 mg | ORAL_CAPSULE | Freq: Three times a day (TID) | ORAL | 3 refills | Status: DC

## 2021-06-12 MED ORDER — VENLAFAXINE HCL ER 75 MG PO CP24
75.0000 mg | ORAL_CAPSULE | Freq: Every day | ORAL | 2 refills | Status: DC
Start: 2021-06-12 — End: 2021-07-18

## 2021-06-12 NOTE — Assessment & Plan Note (Signed)
Referred to neurosurgery to discuss if anything else needs to be done at this time ?

## 2021-06-12 NOTE — Assessment & Plan Note (Signed)
Chronic problem with mild exacerbation.  Patient does feel if she takes the medication on a regular basis including Effexor she seems to be doing relatively well.  We will refill the Effexor at this point as well as given gabapentin.  Patient will slowly titrated up but would not want her to do more than 200 mg 3 times a day max.  Patient will follow-up with me again in 6 weeks. ?Patient is willing her other options which include the possibility of neurosurgery and will be referred appropriately. ?

## 2021-06-12 NOTE — Patient Instructions (Signed)
Good to see you ?Keep up the weight loss ?You are doing great ?Gabapentin '200mg'$  TID ?Effexor refilled ?Referred to Dr. Zada Finders at [facility] placed ?See me again in 6-8 weeks  ?

## 2021-06-13 NOTE — Progress Notes (Signed)
? ? ? ?Chief Complaint:  ? ?OBESITY ?Samantha Hinton is here to discuss her progress with her obesity treatment plan along with follow-up of her obesity related diagnoses. Samantha Hinton is on the Category 3 Plan and states she is following her eating plan approximately 75% of the time. Samantha Hinton states she is walking for 30 minutes 1 time per week. ? ?Today's visit was #: 12 ?Starting weight: 230 lbs ?Starting date: 07/14/2020 ?Today's weight: 201 lbs ?Today's date: 06/01/2021 ?Total lbs lost to date: 69 ?Total lbs lost since last in-office visit: 2 ? ?Interim History: Samantha Hinton hasn't been following the meal plan and she is mad at herself. She  was off her Psychiatric medications for a couple of days, and it "threw her all out of wack". She is here to review labs.  ? ?Subjective:  ? ?1. Type 2 diabetes mellitus with other specified complication, without long-term current use of insulin ([place]) ?Samantha Hinton's recent A1c was 5.8. She is not checking her blood sugars at home. Samantha Hinton is tolerating medication(s) well without side effects.  Medication compliance is good as patient endorses taking it as prescribed.  The patient denies additional concerns regarding this condition. She has been following up with her PCP as we have encouraged her to do. I discussed CMP, FLP and A1c with the patient today. ? ?2. Environmental and seasonal allergies ?Samantha Hinton's symptoms are well controlled on her current regimen.  ? ?3. Vitamin D deficiency ?She is currently taking prescription vitamin D 50,000 IU each week. She denies nausea, vomiting or muscle weakness. I discussed labs with the patient today. ? ?Assessment/Plan:  ?No orders of the defined types were placed in this encounter. ? ? ?Medications Discontinued During This Encounter  ?Medication Reason  ? levocetirizine (XYZAL) 5 MG tablet Reorder  ? montelukast (SINGULAIR) 10 MG tablet Reorder  ? Vitamin D, Ergocalciferol, (DRISDOL) 1.25 MG (50000 UNIT) CAPS capsule Reorder  ? OZEMPIC, 0.25 OR  0.5 MG/DOSE, 2 MG/3ML SOPN Reorder  ?  ? ?Meds ordered this encounter  ?Medications  ? Vitamin D, Ergocalciferol, (DRISDOL) 1.25 MG (50000 UNIT) CAPS capsule  ?  Sig: Take 1 capsule by mouth every 7 days.  ?  Dispense:  4 capsule  ?  Refill:  0  ?  Ov for rf  ? levocetirizine (XYZAL) 5 MG tablet  ?  Sig: Take 1 tablet (5 mg total) by mouth every evening.  ?  Dispense:  30 tablet  ?  Refill:  0  ? montelukast (SINGULAIR) 10 MG tablet  ?  Sig: Take 1 tablet (10 mg total) by mouth at bedtime.  ?  Dispense:  30 tablet  ?  Refill:  0  ? OZEMPIC, 0.25 OR 0.5 MG/DOSE, 2 MG/3ML SOPN  ?  Sig: Inject 0.5 mg into the skin once a week.  ?  Dispense:  3 mL  ?  Refill:  0  ?  ? ?1. Type 2 diabetes mellitus with other specified complication, without long-term current use of insulin ([place]) ?We will refill Ozempic 0.5 mg weekly for 1 month. Improving A1c and improving serum creatinine. Samantha Hinton's triglycerides are  too high and she needs to decrease her fatty carbohydrates. ?  ?- OZEMPIC, 0.25 OR 0.5 MG/DOSE, 2 MG/3ML SOPN; Inject 0.5 mg into the skin once a week.  Dispense: 3 mL; Refill: 0 ? ?2. Environmental and seasonal allergies ?Samantha Hinton will continue her medications, and we will refill Xyzal and Singulair for 1 month. ? ?- levocetirizine (XYZAL) 5 MG tablet; Take  1 tablet (5 mg total) by mouth every evening.  Dispense: 30 tablet; Refill: 0 ?- montelukast (SINGULAIR) 10 MG tablet; Take 1 tablet (10 mg total) by mouth at bedtime.  Dispense: 30 tablet; Refill: 0 ? ?3. Vitamin D deficiency ?Samantha Hinton's level is at goal. She will follow up with her PCP in regards to bone density at age 66.  ? ?- Vitamin D, Ergocalciferol, (DRISDOL) 1.25 MG (50000 UNIT) CAPS capsule; Take 1 capsule by mouth every 7 days.  Dispense: 4 capsule; Refill: 0 ? ?4. Obesity with current BMI of 33.5 ?Samantha Hinton is currently in the action stage of change. As such, her goal is to continue with weight loss efforts. She has agreed to the Category 3 Plan.  ? ?Exercise  goals: As is. Increase walking in the near future daily for 15 minutes if possible. ? ?Behavioral modification strategies: meal planning and cooking strategies and keeping healthy foods in the home. ? ?Samantha Hinton has agreed to follow-up with our clinic in 2 to 3 weeks. She was informed of the importance of frequent follow-up visits to maximize her success with intensive lifestyle modifications for her multiple health conditions.  ? ?Objective:  ? ?Blood pressure 120/76, pulse 100, temperature 98.2 ?F (36.8 ?C), height '5\' 5"'$  (1.651 m), weight 201 lb (91.2 kg), SpO2 96 %. ?Body mass index is 33.45 kg/m?. ? ?General: Cooperative, alert, well developed, in no acute distress. ?HEENT: Conjunctivae and lids unremarkable. ?Cardiovascular: Regular rhythm.  ?Lungs: Normal work of breathing. ?Neurologic: No focal deficits.  ? ?Lab Results  ?Component Value Date  ? CREATININE 1.16 (H) 05/11/2021  ? BUN 31 (H) 05/11/2021  ? NA 141 05/11/2021  ? K 4.4 05/11/2021  ? CL 95 (L) 05/11/2021  ? CO2 27 05/11/2021  ? ?Lab Results  ?Component Value Date  ? ALT 15 05/11/2021  ? AST 21 05/11/2021  ? ALKPHOS 112 05/11/2021  ? BILITOT 0.3 05/11/2021  ? ?Lab Results  ?Component Value Date  ? HGBA1C 5.8 (H) 05/11/2021  ? HGBA1C 6.0 (H) 11/15/2020  ? HGBA1C 6.2 (H) 07/14/2020  ? HGBA1C 6.0 05/09/2020  ? HGBA1C 6.9 (H) 08/04/2019  ? ?Lab Results  ?Component Value Date  ? INSULIN 51.4 (H) 05/11/2021  ? INSULIN 6.1 07/14/2020  ? ?Lab Results  ?Component Value Date  ? TSH 3.06 05/09/2020  ? ?Lab Results  ?Component Value Date  ? CHOL 184 05/11/2021  ? HDL 58 05/11/2021  ? [place] 87 05/11/2021  ? LDLDIRECT 242.0 05/09/2020  ? TRIG 238 (H) 05/11/2021  ? CHOLHDL 3.2 05/11/2021  ? ?Lab Results  ?Component Value Date  ? VD25OH 63.4 05/11/2021  ? VD25OH 57.1 11/15/2020  ? VD25OH 24.6 (L) 07/14/2020  ? ?Lab Results  ?Component Value Date  ? WBC 6.2 05/09/2020  ? HGB 13.6 05/09/2020  ? HCT 41.2 05/09/2020  ? MCV 94.1 05/09/2020  ? PLT 195.0 05/09/2020  ? ?Lab  Results  ?Component Value Date  ? IRON 112 04/10/2019  ? FERRITIN 160.8 04/10/2019  ? ? ?Obesity Behavioral Intervention:  ? ?Approximately 15 minutes were spent on the discussion below. ? ?ASK: ?We discussed the diagnosis of obesity with Samantha Hinton today and Samantha Hinton agreed to give [place] permission to discuss obesity behavioral modification therapy today. ? ?ASSESS: ?Samantha Hinton has the diagnosis of obesity and her BMI today is 33.5. Daphanie is in the action stage of change.  ? ?ADVISE: ?Samantha Hinton was educated on the multiple health risks of obesity as well as the benefit of weight loss to improve her  health. She was advised of the need for long term treatment and the importance of lifestyle modifications to improve her current health and to decrease her risk of future health problems. ? ?AGREE: ?Multiple dietary modification options and treatment options were discussed and Hoang agreed to follow the recommendations documented in the above note. ? ?ARRANGE: ?Symone was educated on the importance of frequent visits to treat obesity as outlined per CMS and USPSTF guidelines and agreed to schedule her next follow up appointment today. ? ?Attestation Statements:  ? ?Reviewed by clinician on day of visit: allergies, medications, problem list, medical history, surgical history, family history, social history, and previous encounter notes. ? ? ?I, Trixie Dredge, am acting as transcriptionist for [employer], DO. ? ?I have reviewed the above documentation for accuracy and completeness, and I agree with the above. Samantha Hinton, D.O. ? ?The [place] was signed into law in 2016 which includes the topic of electronic health records.  This provides immediate access to information in MyChart.  This includes consultation notes, operative notes, office notes, lab results and pathology reports.  If you have any questions about what you read please let us know at your next visit so we can discuss your concerns and  take corrective action if need be.  We are right here with you. ? ? ?

## 2021-06-14 ENCOUNTER — Encounter (INDEPENDENT_AMBULATORY_CARE_PROVIDER_SITE_OTHER): Payer: Self-pay | Admitting: Family Medicine

## 2021-06-14 ENCOUNTER — Telehealth (INDEPENDENT_AMBULATORY_CARE_PROVIDER_SITE_OTHER): Payer: Medicare Other | Admitting: Family Medicine

## 2021-06-14 ENCOUNTER — Ambulatory Visit (INDEPENDENT_AMBULATORY_CARE_PROVIDER_SITE_OTHER): Payer: Medicare Other | Admitting: Family Medicine

## 2021-06-14 DIAGNOSIS — E669 Obesity, unspecified: Secondary | ICD-10-CM | POA: Diagnosis not present

## 2021-06-14 DIAGNOSIS — Z7985 Long-term (current) use of injectable non-insulin antidiabetic drugs: Secondary | ICD-10-CM | POA: Diagnosis not present

## 2021-06-14 DIAGNOSIS — N1831 Chronic kidney disease, stage 3a: Secondary | ICD-10-CM | POA: Diagnosis not present

## 2021-06-14 DIAGNOSIS — E1122 Type 2 diabetes mellitus with diabetic chronic kidney disease: Secondary | ICD-10-CM

## 2021-06-14 DIAGNOSIS — Z6833 Body mass index (BMI) 33.0-33.9, adult: Secondary | ICD-10-CM

## 2021-06-14 DIAGNOSIS — J449 Chronic obstructive pulmonary disease, unspecified: Secondary | ICD-10-CM

## 2021-06-14 NOTE — Progress Notes (Signed)
?TeleHealth Visit:  ?This visit was completed with telemedicine (audio/video) technology. ?Samantha Hinton has verbally consented to this TeleHealth visit. The patient is located at home, the provider is located at home. The participants in this visit include the listed provider and patient. The visit was conducted today via MyChart video. ? ?OBESITY ?Samantha Hinton is here to discuss her progress with her obesity treatment plan along with follow-up of her obesity related diagnoses.  ? ?Today's visit was # 13 ?Starting weight: 230 lbs ?Starting date: 07/14/2020 ?Weight at last in office visit: 201 lbs on 06/01/21 ?Total weight loss: 29 lbs at last in office visit on 06/01/21. ?Today's reported weight: 201 lbs  ? ?Nutrition Plan: the Category 3 Plan.  ?Hunger is well controlled. Cravings are well controlled.  ?Current exercise:  walks sporadically ? ?Interim History: Samantha Hinton generally eats breakfast on the plan but then skips lunch quite often which leads to overeating at dinner.  She reports she does not snack much and she does not drink any sugar sweetened beverages.  She also tends to have a carbohydrate with dinner because her roommate likes to have rice and then it is tempting to her. ? ?She would like to alternate in office and virtual visits. ? ?Assessment/Plan:  ?2. 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 ?CBGs: Not checking.  She has a glucometer on order from [employer]. ?Any episodes of hypoglycemia? no ?Medication(s): Ozempic 0.5 mg weekly. Mild nausea. No constipation. ? ?Lab Results  ?Component Value Date  ? HGBA1C 5.8 (H) 05/11/2021  ? HGBA1C 6.0 (H) 11/15/2020  ? HGBA1C 6.2 (H) 07/14/2020  ? ?Lab Results  ?Component Value Date  ? MICROALBUR 5.2 (H) 05/09/2020  ? [place] 87 05/11/2021  ? CREATININE 1.16 (H) 05/11/2021  ? ? ?Plan: ?Continue Ozempic at 0.5 mg weekly. ? ?2.  COPD Gold 2 ?Samantha Hinton smoked for 40 years but quit 7 to 8 years ago when she went into  respiratory failure and was hospitalized and on a BiPAP.  She was sent home with oxygen and she decided she did not want to be dependent on oxygen so she knew she had to stop smoking.  She tends to get out of breath quickly with short walks but is able to walk about 15 minutes. ?Medications: She is on Ziac, Breztri and albuterol rescue inhaler. ? ?Plan: ?Continue all medications. ?Follow-up with Dr. Melvyn Novas, her pulmonologist, as directed. ? ?3.  Obesity: Current BMI 33.45 ?Samantha Hinton is currently in the action stage of change. As such, her goal is to continue with weight loss efforts.  ?She has agreed to the Category 3 Plan.  ? ?Exercise goals: walk 15 minutes 3 days/week. ? ?Behavioral modification strategies: increasing lean protein intake and no skipping meals. ?We discussed the importance of not skipping lunch to improve her metabolism and reduce overeating at dinner. ?Also discussed ways she could have 1/2 cup of rice at dinner such as using snack calories or using bread calories from lunch if she has a salad for lunch. ? ?Samantha Hinton has agreed to follow-up with our clinic in 2 weeks.  ? ?No orders of the defined types were placed in this encounter. ? ? ?There are no discontinued medications.  ? ?No orders of the defined types were placed in this encounter. ?   ? ?Objective:  ? ?VITALS: Per patient if applicable, see vitals. ?GENERAL: Alert and in no acute distress. ?CARDIOPULMONARY: No increased WOB. Speaking in clear sentences.  ?PSYCH: Pleasant and  cooperative. Speech normal rate and rhythm. Affect is appropriate. Insight and judgement are appropriate. Attention is focused, linear, and appropriate.  ?NEURO: Oriented as arrived to appointment on time with no prompting.  ? ?Lab Results  ?Component Value Date  ? CREATININE 1.16 (H) 05/11/2021  ? BUN 31 (H) 05/11/2021  ? NA 141 05/11/2021  ? K 4.4 05/11/2021  ? CL 95 (L) 05/11/2021  ? CO2 27 05/11/2021  ? ?Lab Results  ?Component Value Date  ? ALT 15 05/11/2021  ? AST  21 05/11/2021  ? ALKPHOS 112 05/11/2021  ? BILITOT 0.3 05/11/2021  ? ?Lab Results  ?Component Value Date  ? HGBA1C 5.8 (H) 05/11/2021  ? HGBA1C 6.0 (H) 11/15/2020  ? HGBA1C 6.2 (H) 07/14/2020  ? HGBA1C 6.0 05/09/2020  ? HGBA1C 6.9 (H) 08/04/2019  ? ?Lab Results  ?Component Value Date  ? INSULIN 51.4 (H) 05/11/2021  ? INSULIN 6.1 07/14/2020  ? ?Lab Results  ?Component Value Date  ? TSH 3.06 05/09/2020  ? ?Lab Results  ?Component Value Date  ? CHOL 184 05/11/2021  ? HDL 58 05/11/2021  ? [place] 87 05/11/2021  ? LDLDIRECT 242.0 05/09/2020  ? TRIG 238 (H) 05/11/2021  ? CHOLHDL 3.2 05/11/2021  ? ?Lab Results  ?Component Value Date  ? WBC 6.2 05/09/2020  ? HGB 13.6 05/09/2020  ? HCT 41.2 05/09/2020  ? MCV 94.1 05/09/2020  ? PLT 195.0 05/09/2020  ? ?Lab Results  ?Component Value Date  ? IRON 112 04/10/2019  ? FERRITIN 160.8 04/10/2019  ? ?Lab Results  ?Component Value Date  ? VD25OH 63.4 05/11/2021  ? VD25OH 57.1 11/15/2020  ? VD25OH 24.6 (L) 07/14/2020  ? ? ?Attestation Statements:  ? ?Reviewed by clinician on day of visit: allergies, medications, problem list, medical history, surgical history, family history, social history, and previous encounter notes. ? ?Time spent on visit including pre-visit chart review and post-visit charting and care was 35 minutes.  ? ? ?

## 2021-06-15 ENCOUNTER — Other Ambulatory Visit: Payer: Self-pay | Admitting: Internal Medicine

## 2021-06-16 ENCOUNTER — Telehealth: Payer: Self-pay | Admitting: Pharmacist

## 2021-06-16 NOTE — Chronic Care Management (AMB) (Signed)
? ? ?Chronic Care Management ?Pharmacy Assistant  ? ?Name: Samantha Hinton  MRN: [id number] DOB: 1956/02/09 ? ?Reason for Encounter: Medication Coordination/ Medication Review ?  ?Conditions to be addressed/monitored: ?HTN ? ?Recent office visits:  ?None ? ?Recent consult visits:  ?06/14/21 [place], Joneen Boers, FNP - Patient presented for Type 2 diabetes mellitus with stage 3A CKD without long term current use of insulin and other concerns. No medication changes. ? ?06/12/21 Lyndal Pulley, DO - Patient presented for Spondylolisthesis of lumbar region and other concerns. Changed Gabapentin 200 mg. ? ?06/01/21 [name], DO - Patient presented for Type 2 diabetes mellitus with other specified complication without long-term current use of insulin and other concerns. No medication changes. ? ?Hospital visits:  ?None in previous 6 months ? ?Medications: ?Outpatient Encounter Medications as of 06/16/2021  ?Medication Sig Note  ? albuterol (PROAIR HFA) 108 (90 Base) MCG/ACT inhaler 2 puffs every 4 hours as needed only  if your can't catch your breath   ? albuterol (PROVENTIL) (2.5 MG/3ML) 0.083% nebulizer solution Take 3 mLs (2.5 mg total) by nebulization every 6 (six) hours as needed for wheezing or shortness of breath.   ? allopurinol (ZYLOPRIM) 100 MG tablet TAKE ONE TABLET BY MOUTH TWICE DAILY   ? bisoprolol-hydrochlorothiazide (ZIAC) 5-6.25 MG tablet Take 1 tablet by mouth 2 (two) times daily.   ? BREZTRI AEROSPHERE 160-9-4.8 MCG/ACT AERO INHALE TWO PUFFS BY MOUTH INTO LUNGS TWICE DAILY   ? cloNIDine (CATAPRES) 0.1 MG tablet Take 1 tablet (0.1 mg total) by mouth 2 (two) times daily.   ? Colchicine 0.6 MG CAPS Take 0.6 mg by mouth 2 (two) times daily as needed. 07/14/2020: Using prn  ? gabapentin (NEURONTIN) 100 MG capsule Take 2 capsules (200 mg total) by mouth 3 (three) times daily.   ? levocetirizine (XYZAL) 5 MG tablet Take 1 tablet (5 mg total) by mouth every evening.   ? linaclotide (LINZESS) 145 MCG CAPS  capsule Take 1 capsule (145 mcg total) by mouth daily.   ? montelukast (SINGULAIR) 10 MG tablet Take 1 tablet (10 mg total) by mouth at bedtime.   ? omeprazole (PRILOSEC) 40 MG capsule TAKE ONE TABLET BY MOUTH TWICE DAILY   ? ondansetron (ZOFRAN ODT) 4 MG disintegrating tablet Take 1 tablet (4 mg total) by mouth every 8 (eight) hours as needed for nausea or vomiting.   ? OZEMPIC, 0.25 OR 0.5 MG/DOSE, 2 MG/3ML SOPN Inject 0.5 mg into the skin once a week.   ? polyethylene glycol (MIRALAX / GLYCOLAX) 17 g packet Take 17 g by mouth 2 (two) times daily. Until stooling regularly   ? predniSONE (DELTASONE) 10 MG tablet Take  4 each am x 2 days,   2 each am x 2 days,  1 each am x 2 days and stop (Patient taking differently: Take  4 each am x 2 days,   2 each am x 2 days,  1 each am x 2 days and stop)   ? rosuvastatin (CRESTOR) 10 MG tablet TAKE ONE TABLET BY MOUTH EVERY EVENING   ? venlafaxine XR (EFFEXOR XR) 75 MG 24 hr capsule Take 1 capsule (75 mg total) by mouth daily with breakfast.   ? Vitamin D, Ergocalciferol, (DRISDOL) 1.25 MG (50000 UNIT) CAPS capsule Take 1 capsule by mouth every 7 days.   ? ?No facility-administered encounter medications on file as of 06/16/2021.  ?Reviewed chart prior to disease state call. Spoke with patient regarding BP ? ?Recent Office Vitals: ?BP  Readings from Last 3 Encounters:  ?06/12/21 130/90  ?06/01/21 120/76  ?05/11/21 133/78  ? ?Pulse Readings from Last 3 Encounters:  ?06/12/21 93  ?06/01/21 100  ?05/11/21 95  ?  ?Wt Readings from Last 3 Encounters:  ?06/12/21 203 lb (92.1 kg)  ?06/01/21 201 lb (91.2 kg)  ?05/11/21 203 lb (92.1 kg)  ?  ? ?Kidney Function ?Lab Results  ?Component Value Date/Time  ? CREATININE 1.16 (H) 05/11/2021 09:01 AM  ? CREATININE 1.44 (H) 11/15/2020 09:43 AM  ? GFR 58.85 (L) 05/09/2020 10:22 AM  ? GFRNONAA >60 12/27/2014 07:57 AM  ? GFRAA >60 12/27/2014 07:57 AM  ? ? ? ?  Latest Ref Rng & Units 05/11/2021  ?  9:01 AM 11/15/2020  ?  9:43 AM 05/09/2020  ? 10:22 AM   ?BMP  ?Glucose 70 - 99 mg/dL 106   106   114    ?BUN 8 - 27 mg/dL '31   26   22    '$ ?Creatinine 0.57 - 1.00 mg/dL 1.16   1.44   1.01    ?BUN/Creat Ratio 12 - '28 27   18     '$ ?Sodium 134 - 144 mmol/L 141   140   137    ?Potassium 3.5 - 5.2 mmol/L 4.4   3.9   4.5    ?Chloride 96 - 106 mmol/L 95   89   93    ?CO2 20 - 29 mmol/L 27   31   33    ?Calcium 8.7 - 10.3 mg/dL 9.9   10.2   9.5    ? ? ?Current antihypertensive regimen:  ?Bisoprolol- HCTZ 5-6.25 MG - take 1 tablet twice daily in the morning and with dinner ?  ? ?Reviewed chart for medication changes ahead of medication coordination call. ? ?No OVs, Consults, or hospital visits since last care coordination call/Pharmacist visit.  ?No medication changes indicated . ? ?BP Readings from Last 3 Encounters:  ?06/12/21 130/90  ?06/01/21 120/76  ?05/11/21 133/78  ?  ?Lab Results  ?Component Value Date  ? HGBA1C 5.8 (H) 05/11/2021  ?  ? ?Patient obtains medications through Adherence Packaging  30 Days  ? ?Last adherence delivery included:  ?Levocetirizine 10 mg - take 1 tablet daily at dinner ?Montelukast 10 mg - take 1 tablet at bedtime ? Omeprazole 40 mg - take 1 capsule twice daily at breakfast and dinner ?Clonidine 0.'1mg'$  - take 1 tablet twice daily at breakfast and dinner ?Allopurinol 100 mg - take 1 tablet twice daily at breakfast and dinner ?Rosuvastatin 10 mg - take 1 tablet with dinner ?Vitamin D 1250 mcg - take 1 capsule once weekly (Wednesday ?Ozempic 0.5 mg : Inject 0.5 mg once weekly ?Breztri Inhaler ?Venlafaxine 75 mg - take 1 capsule daily at breakfast ?Bisoprolol- HCTZ 5-6.25 MG - take 1 tablet twice daily at breakfast and dinner ?Gabapentin 100 mg - take 1 capsule twice daily at breakfast and dinner ? ?Patient is due for next adherence delivery on: 06/28/21. ?Called patient and reviewed medications and coordinated delivery. ?Packs for 30 DS ? ?This delivery to include: ?Levocetirizine 10 mg - take 1 tablet daily at dinner ?Montelukast 10 mg - take 1 tablet  at bedtime ? Omeprazole 40 mg - take 1 capsule twice daily at breakfast and dinner ?Clonidine 0.'1mg'$  - take 1 tablet twice daily at breakfast and dinner ?Allopurinol 100 mg - take 1 tablet twice daily at breakfast and dinner ?Rosuvastatin 10 mg - take 1 tablet with dinner ?Vitamin  D 1250 mcg - take 1 capsule once weekly (Wednesday ?Ozempic 0.5 mg : Inject 0.5 mg once weekly ?Breztri Inhaler ?Venlafaxine 75 mg - take 1 capsule daily at breakfast ?Bisoprolol- HCTZ 5-6.25 MG - take 1 tablet twice daily at breakfast and dinner ?Gabapentin 100 mg - take 1 capsule twice daily at breakfast and dinner ? ? ?Left message to confirm delivery date of 06/28/21, advised  that pharmacy will attempt to  contact them the morning of delivery.  ? ? ?Care Gaps: ?Foot Exam - Overdue ?Eye Exam - Overdue ?Zoster Vaccine - Overdue ?Pap Smear - Overdue ?Mammogram - Overdue ?COVID Booster - Overdue ?Dexa - Overdue ?PNA Vaccine - Due Soon ?CCM- Need Unable to reach to coordinate ?AWV-11/22 ?BP- 120/76 ( 06/01/21) ?Lab Results  ?Component Value Date  ? HGBA1C 5.8 (H) 05/11/2021  ? ? ?Star Rating Drugs: ?Rosuvastatin (Crestor) 10 mg - Last filled 05/25/21 30 DS at [facility] ?Semaglutide (Ozempic) - Sent to pt 05/25/21 35 DS at [facility] ? ? ? ?Ned Clines CMA ?Clinical Pharmacist Assistant ?[phone] ? ?

## 2021-06-23 ENCOUNTER — Other Ambulatory Visit: Payer: Self-pay | Admitting: Internal Medicine

## 2021-06-26 NOTE — Telephone Encounter (Signed)
Dr Melvyn Novas do you continue to keep refilling these for her? Ziac and clonidine  ?

## 2021-06-26 NOTE — Telephone Encounter (Signed)
I see she has appt 5/10 with PCP, please ask her to address this issue with them so they can take over management of her hbp ?

## 2021-06-27 ENCOUNTER — Telehealth: Payer: Self-pay | Admitting: Internal Medicine

## 2021-06-27 NOTE — Telephone Encounter (Signed)
Called and spoke with patient. Patient states she needs refills on clonidine and bisoprolol. Patient verified pharmacy.  ? ?MW please advise. ?

## 2021-06-27 NOTE — Telephone Encounter (Signed)
I had already responded to this :  she is seeing her PCP tomorrow and should discuss this with them and let them take over rx long term ?

## 2021-06-28 ENCOUNTER — Ambulatory Visit (INDEPENDENT_AMBULATORY_CARE_PROVIDER_SITE_OTHER): Payer: Medicare Other | Admitting: Family Medicine

## 2021-06-28 NOTE — Telephone Encounter (Signed)
ATC patient.  LMTCB. 

## 2021-06-29 ENCOUNTER — Other Ambulatory Visit: Payer: Self-pay | Admitting: Internal Medicine

## 2021-06-29 NOTE — Telephone Encounter (Signed)
I keep getting this same message re refills for 2 bp meds  ? ?PCP should have taken this over if she saw them as I thought would happen 06/28/21 and I documented request that they take over.   ? ?We need to call her today to see if they have and if not give her enough refills until they do ?

## 2021-06-30 ENCOUNTER — Telehealth: Payer: Self-pay | Admitting: Internal Medicine

## 2021-06-30 ENCOUNTER — Other Ambulatory Visit: Payer: Self-pay | Admitting: Internal Medicine

## 2021-06-30 ENCOUNTER — Other Ambulatory Visit: Payer: Self-pay

## 2021-06-30 MED ORDER — BREZTRI AEROSPHERE 160-9-4.8 MCG/ACT IN AERO: 2.0000 | INHALATION_SPRAY | Freq: Two times a day (BID) | RESPIRATORY_TRACT | 0 refills | Status: DC

## 2021-06-30 MED ORDER — BREZTRI AEROSPHERE 160-9-4.8 MCG/ACT IN AERO: 2.0000 | INHALATION_SPRAY | Freq: Two times a day (BID) | RESPIRATORY_TRACT | 11 refills | Status: DC

## 2021-06-30 NOTE — Telephone Encounter (Signed)
Patient came into the office to pick up Breztri samples. She said she had talked to [facility] and she was told they would be up front for her to pick up. Patient states that she is already in the donut hole and the inhaler is over $100 and she can't afford it with someone of the other medications she has to get. I provided patient with 2 samples and also filled out the patient assistance paperwork with her. Will get Dr. Melvyn Novas to sign it and then will be faxed in. Nothing further needed at this time.  ?

## 2021-06-30 NOTE — Telephone Encounter (Signed)
Pt is calling and pt need refill on cloNIDine (CATAPRES) 0.1 MG tablet and bisoprolol-hydrochlorothiazide (ZIAC) 5-6.25 MG tablet  ?[facility] - [place], [place] - [address] Phone:  [phone]  ?Fax:  [phone]  ?  ? ?

## 2021-06-30 NOTE — Telephone Encounter (Signed)
Called the pt and there was no answer- LMTCB    

## 2021-06-30 NOTE — Telephone Encounter (Signed)
Patient is returning phone call. Patient phone number is [phone]. ?

## 2021-06-30 NOTE — Telephone Encounter (Signed)
Called and spoke with patient. Advised her that mW wanted her to discuss the medicines she needed refilled with her PCP. Patient verbalized understanding.  ? ?Nothing further needed.  ?

## 2021-07-03 NOTE — Telephone Encounter (Signed)
visit last visit with me was July 2022.  I see that she has seen chronic disease management and weight management in the meantime.  Dr. Melvyn Novas had previously been prescribing a number of her medicines. ?Option 1 make appointment with me to discuss transferring prescription medications to PCP ?Option 2 can temporarily ask medicines with weight management about prescribing medicines since they are who is seeing her frequently and managing her other problems. ? ?

## 2021-07-03 NOTE — Telephone Encounter (Signed)
Both Rx was refused by prescribing doctor  ?Please advise  ?

## 2021-07-04 MED ORDER — CLONIDINE HCL 0.1 MG PO TABS: 0.1000 mg | ORAL_TABLET | Freq: Two times a day (BID) | ORAL | 0 refills | Status: DC

## 2021-07-04 MED ORDER — BISOPROLOL-HYDROCHLOROTHIAZIDE 5-6.25 MG PO TABS
1.0000 | ORAL_TABLET | Freq: Two times a day (BID) | ORAL | 0 refills | Status: DC
Start: 2021-07-04 — End: 2021-07-21

## 2021-07-04 NOTE — Telephone Encounter (Signed)
Spoke with patient and she is scheduled for 07/11/21 @ 10:30 patient would like to know if an Rx for both medications can be sent to the pharmacy until her appt patient stated she is completely out  ?

## 2021-07-04 NOTE — Addendum Note (Signed)
Addended by: Geradine Girt D on: 07/04/2021 08:46 AM ? ? Modules accepted: Orders ? ?

## 2021-07-04 NOTE — Telephone Encounter (Signed)
Please send in one month worth of each medication to  her pharmacy  thanks

## 2021-07-11 ENCOUNTER — Ambulatory Visit (INDEPENDENT_AMBULATORY_CARE_PROVIDER_SITE_OTHER): Payer: Medicare Other | Admitting: Internal Medicine

## 2021-07-11 ENCOUNTER — Encounter: Payer: Self-pay | Admitting: Internal Medicine

## 2021-07-11 VITALS — BP 140/80 | HR 82 | Temp 98.1°F | Ht 65.0 in | Wt 206.8 lb

## 2021-07-11 DIAGNOSIS — F4320 Adjustment disorder, unspecified: Secondary | ICD-10-CM | POA: Diagnosis not present

## 2021-07-11 DIAGNOSIS — R3989 Other symptoms and signs involving the genitourinary system: Secondary | ICD-10-CM

## 2021-07-11 DIAGNOSIS — I10 Essential (primary) hypertension: Secondary | ICD-10-CM

## 2021-07-11 DIAGNOSIS — Z634 Disappearance and death of family member: Secondary | ICD-10-CM

## 2021-07-11 DIAGNOSIS — F4322 Adjustment disorder with anxiety: Secondary | ICD-10-CM | POA: Diagnosis not present

## 2021-07-11 DIAGNOSIS — Z79899 Other long term (current) drug therapy: Secondary | ICD-10-CM

## 2021-07-11 LAB — POCT URINALYSIS DIPSTICK
Bilirubin, UA: POSITIVE
Blood, UA: NEGATIVE
Glucose, UA: NEGATIVE
Ketones, UA: NEGATIVE
Leukocytes, UA: NEGATIVE
Nitrite, UA: NEGATIVE
Protein, UA: POSITIVE — AB
Spec Grav, UA: 1.025 (ref 1.010–1.025)
Urobilinogen, UA: 1 E.U./dL
pH, UA: 6 (ref 5.0–8.0)

## 2021-07-11 MED ORDER — CEPHALEXIN 500 MG PO CAPS: 500.0000 mg | ORAL_CAPSULE | Freq: Three times a day (TID) | ORAL | 0 refills | Status: DC

## 2021-07-11 NOTE — Patient Instructions (Addendum)
Agree advise  help  from counselor .  And psych for medication. ?    I can do referral but you may need to contact call on  your own.  If emergent   behavioral health    but sudden death adds to the  pain and difficult y of adjustment .   Will  continue same medication  ziac and clonidine for now .  Repeat bp was 140/80   Plan follow up  in 1 month est or as  indicated  needed.   Empiric  treatment for UTI  awaiting culture .

## 2021-07-11 NOTE — Progress Notes (Signed)
Chief Complaint  Patient presents with   Medication Refill    And possible Uti Referral to psychiatrist    HPI: Samantha Hinton 66 y.o. come in for Chronic disease management but a number of issues. Not doing very well today  Frequency and hard to  urinate   odor . Pain  no blood acts like a UTI some back pain low but no fever and chills has a bad odor to it feels like a UTI  Would like referral to psychiatry psychology needs help.  Not suicidal but great to sports distress. Sig other  of 13 years passed away  05/26/24suddenly found in bathroom Was found in bathroom.  Natural causes   maybe cardiac.  Was diabetic .  Husband had  passed away  in bed next to her.  In past Past hx of drinking last time dark place.    Living with son . Beginning to drink again.  Although not that much.  Meds : Transitioning medicines Ziac and clonidine from Dr. Melvyn Novas to [place].  In the meantime because of the delay had run out of medicine for almost a week just restarted has not checked her blood pressure readings recently  Has been seeing weight management not sure she is going to be able to continue to afford the Ozempic she is already in the donut hole.   ROS: See pertinent positives and negatives per HPI.  Past Medical History:  Diagnosis Date   Anxiety    Asthma    Chronic back pain    buldging disc and tumor.Spondylolisthesis   COPD (chronic obstructive pulmonary disease) (HCC)    Symbicort daily and ProAir as needed   Depression    Emphysema    Emphysema of lung ([place])    Fatty liver 04/13/2019   Fatty liver    Fibromyalgia    Gallbladder problem    Genital warts    GERD (gastroesophageal reflux disease)    takes Omeprazole daily   Heartburn    History of bronchitis 01/2014   History of colon polyps    Hyperlipidemia    has been off of meds d/t getting sick from them.Not been addressed again.    Hypertension    takes Ziac daily   Nocturia    Pneumonia    hx of-8+yrs ago   PONV  (postoperative nausea and vomiting)    Shortness of breath dyspnea    thinks d/t pain meds and notices with exertion   SOBOE (shortness of breath on exertion)    Stroke ([place])    16 yrs ago--left eye    Swallowing difficulty     Family History  Problem Relation Age of Onset   CAD Father        tumor between heart and lung    Hypertension Father    Cancer Father    CAD Sister        ? dx    Hypertension Mother    Lung cancer Mother        died 50    Diabetes Maternal Grandmother        late 28s    Colon cancer Neg Hx    Colon polyps Neg Hx    Esophageal cancer Neg Hx    Rectal cancer Neg Hx    Stomach cancer Neg Hx     Social History   Socioeconomic History   Marital status: Significant Other    Spouse name: [name]   Number of  children: 2   Years of education: Not on file   Highest education level: Not on file  Occupational History   Occupation: retired    Fish farm manager: [name]  Tobacco Use   Smoking status: Former    Packs/day: 0.25    Years: 44.00    Pack years: 11.00    Types: Cigarettes    Quit date: 02/04/2014    Years since quitting: 7.4   Smokeless tobacco: Never   Tobacco comments:    quit smoking in Dec 2015  Vaping Use   Vaping Use: Former  Substance and Sexual Activity   Alcohol use: Yes    Alcohol/week: 4.0 standard drinks    Types: 4 Standard drinks or equivalent per week    Comment: couple times a week   Drug use: No   Sexual activity: Not Currently    Birth control/protection: Surgical    Comment: 1st intercourse 66 yo-Fewer than 5 partners  Other Topics Concern   Not on file  Social History Narrative   6-8 hours of sleep per night   Lives with her fiance hh of 2 no pets    1 dog in the home   Retired no ets  But tob 8 per day   etoh ocass 1-2    On disability from her neck surgery predicaments .   orig from [employer] in [place] area.    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 2   Last Pap 2006 last  mammogram 2000 and   Has dentures   FA    Social Determinants of Health   Financial Resource Strain: Medium Risk   Difficulty of Paying Living Expenses: Somewhat hard  Food Insecurity: No Food Insecurity   Worried About Charity fundraiser in the Last Year: Never true   Ran Out of Food in the Last Year: Never true  Transportation Needs: No Transportation Needs   Lack of Transportation (Medical): No   Lack of Transportation (Non-Medical): No  Physical Activity: Inactive   Days of Exercise per Week: 0 days   Minutes of Exercise per Session: 0 min  Stress: No Stress Concern Present   Feeling of Stress : Only a little  Social Connections: Socially Isolated   Frequency of Communication with Friends and Family: Twice a week   Frequency of Social Gatherings with Friends and Family: Three times a week   Attends Religious Services: Never   Active Member of Clubs or Organizations: No   Attends Archivist Meetings: Never   Marital Status: Divorced    Outpatient Medications Prior to Visit  Medication Sig Dispense Refill   albuterol (PROAIR HFA) 108 (90 Base) MCG/ACT inhaler 2 puffs every 4 hours as needed only  if your can't catch your breath 18 g 11   albuterol (PROVENTIL) (2.5 MG/3ML) 0.083% nebulizer solution Take 3 mLs (2.5 mg total) by nebulization every 6 (six) hours as needed for wheezing or shortness of breath. 75 mL 12   allopurinol (ZYLOPRIM) 100 MG tablet TAKE ONE TABLET BY MOUTH TWICE DAILY 60 tablet 1   bisoprolol-hydrochlorothiazide (ZIAC) 5-6.25 MG tablet Take 1 tablet by mouth 2 (two) times daily. 60 tablet 0   Budeson-Glycopyrrol-Formoterol (BREZTRI AEROSPHERE) 160-9-4.8 MCG/ACT AERO Inhale 2 puffs into the lungs 2 (two) times daily. 10.7 g 11   cloNIDine (CATAPRES) 0.1 MG tablet Take 1 tablet (0.1 mg total) by mouth 2 (two) times daily. 60 tablet 0   Colchicine 0.6 MG CAPS Take  0.6 mg by mouth 2 (two) times daily as needed. 60 capsule 1   gabapentin (NEURONTIN) 100  MG capsule Take 2 capsules (200 mg total) by mouth 3 (three) times daily. 180 capsule 3   levocetirizine (XYZAL) 5 MG tablet Take 1 tablet (5 mg total) by mouth every evening. 30 tablet 0   linaclotide (LINZESS) 145 MCG CAPS capsule Take 1 capsule (145 mcg total) by mouth daily. 30 capsule 0   montelukast (SINGULAIR) 10 MG tablet Take 1 tablet (10 mg total) by mouth at bedtime. 30 tablet 0   omeprazole (PRILOSEC) 40 MG capsule TAKE ONE TABLET BY MOUTH TWICE DAILY 60 capsule 5   ondansetron (ZOFRAN ODT) 4 MG disintegrating tablet Take 1 tablet (4 mg total) by mouth every 8 (eight) hours as needed for nausea or vomiting. 20 tablet 0   OZEMPIC, 0.25 OR 0.5 MG/DOSE, 2 MG/3ML SOPN Inject 0.5 mg into the skin once a week. 3 mL 0   polyethylene glycol (MIRALAX / GLYCOLAX) 17 g packet Take 17 g by mouth 2 (two) times daily. Until stooling regularly 60 packet 0   predniSONE (DELTASONE) 10 MG tablet Take  4 each am x 2 days,   2 each am x 2 days,  1 each am x 2 days and stop (Patient taking differently: Take  4 each am x 2 days,   2 each am x 2 days,  1 each am x 2 days and stop) 14 tablet 11   rosuvastatin (CRESTOR) 10 MG tablet TAKE ONE TABLET BY MOUTH EVERY EVENING 90 tablet 1   venlafaxine XR (EFFEXOR XR) 75 MG 24 hr capsule Take 1 capsule (75 mg total) by mouth daily with breakfast. 15 capsule 2   Vitamin D, Ergocalciferol, (DRISDOL) 1.25 MG (50000 UNIT) CAPS capsule Take 1 capsule by mouth every 7 days. 4 capsule 0   No facility-administered medications prior to visit.     EXAM:  BP 140/80   Pulse 82   Temp 98.1 F (36.7 C) (Oral)   Ht '5\' 5"'$  (1.651 m)   Wt 206 lb 12.8 oz (93.8 kg)   SpO2 98%   BMI 34.41 kg/m   Body mass index is 34.41 kg/m.  GENERAL: vitals reviewed and listed above, alert, oriented, appears well hydrated and   looking stressed but cognitively intact and normal speech HEENT: atraumatic, conjunctiva  clear, no obvious abnormalities on inspection of external nose and  ears NECK: no obvious masses on inspection palpation  LUNGS: clear to auscultation bilaterally, no wheezes, rales or rhonchi, good air movement CV: HRRR, no clubbing cyanosis slight peripheral edema nl cap refill  Skin some fading bruises on hands puppy lacerations improving healed noninfected MS: moves all extremities without noticeable focal  abnormality PSYCH: Stressed and down somewhat anxious with normal cognitive thought.  Speech is normal. Lab Results  Component Value Date   WBC 6.2 05/09/2020   HGB 13.6 05/09/2020   HCT 41.2 05/09/2020   PLT 195.0 05/09/2020   GLUCOSE 106 (H) 05/11/2021   CHOL 184 05/11/2021   TRIG 238 (H) 05/11/2021   HDL 58 05/11/2021   LDLDIRECT 242.0 05/09/2020   LDLCALC 87 05/11/2021   ALT 15 05/11/2021   AST 21 05/11/2021   NA 141 05/11/2021   K 4.4 05/11/2021   CL 95 (L) 05/11/2021   CREATININE 1.16 (H) 05/11/2021   BUN 31 (H) 05/11/2021   CO2 27 05/11/2021   TSH 3.06 05/09/2020   HGBA1C 5.8 (H) 05/11/2021   MICROALBUR  5.2 (H) 05/09/2020   BP Readings from Last 3 Encounters:  07/11/21 140/80  06/12/21 130/90  06/01/21 120/76  UA positive bili protein negative for leuks  ASSESSMENT AND PLAN:  Discussed the following assessment and plan:  Essential hypertension, benign - Repeat blood pressure 149/70 uncertain if at goal at home not changing dosing at this time as she had run out follow-up in 1 month  Suspected UTI - Weight dip not typical but symptoms are empiric treatment Keflex await culture - Plan: POC Urinalysis Dipstick, Urine Culture  Bereavement reaction - Set up for complicated grief unresolved issues agree with referral - Plan: Ambulatory referral to Psychiatry, Ambulatory referral to Psychology  Adjustment disorder with anxious mood - Plan: Ambulatory referral to Psychiatry, Ambulatory referral to Psychology  Medication management - Plan: Ambulatory referral to Psychiatry, Ambulatory referral to Psychology 45 minutes record  review addressing medications urinary symptoms acute bereavement and grief and psychological aspects.  Close follow-up advised. -Patient advised to return or notify health care team  if  new concerns arise.  Patient Instructions  Agree advise  help  from counselor .  And psych for medication. ?    I can do referral but you may need to contact call on  your own.  If emergent   behavioral health    but sudden death adds to the  pain and difficult y of adjustment .   Will  continue same medication  ziac and clonidine for now .  Repeat bp was 140/80   Plan follow up  in 1 month est or as  indicated  needed.   Empiric  treatment for UTI  awaiting culture .        Standley Brooking. Jaylenn Altier M.D.

## 2021-07-12 LAB — URINE CULTURE
MICRO NUMBER:: 13433730
SPECIMEN QUALITY:: ADEQUATE

## 2021-07-12 NOTE — Progress Notes (Signed)
TeleHealth Visit:  This visit was completed with telemedicine (audio/video) technology. Samantha Hinton has verbally consented to this TeleHealth visit. The patient is located at home, the provider is located at home. The participants in this visit include the listed provider and patient. The visit was conducted today via MyChart video.  OBESITY Samantha Hinton is here to discuss her progress with her obesity treatment plan along with follow-up of her obesity related diagnoses.   Today's visit was # 14 Starting weight: 230 lbs Starting date: 07/14/2020 Weight at last in office visit: 201 lbs on 06/01/21 Total weight loss: 29 lbs at last in office visit on 06/01/21. Today's reported weight: 205 lbs   Nutrition Plan: the Category 3 Plan.  Current exercise: active in home.  Interim History: Samantha Hinton's live in fianc passed away at home suddenly on May 3.  They have been together 13 years.  Samantha Hinton discovered the body.  She is currently staying with her son and will be selling her house for financial reasons.  Her son is going to put a tiny home on his land for her. She is trying to come to grips with the grief and life changes. She recently saw her PCP who referred her to psychiatry and psychology.  Encouraged her to reach out to her PCP if she does not hear from the psychology/psychiatry offices within a week or so.  Samantha Hinton has not been following a meal plan but reports she is eating healthy because her daughter-in-law makes sure the family eats in a healthy manner.  Samantha Hinton would like to do virtual visits for the near future.  We will schedule another virtual visit in 4 weeks and then get her back into the office.  Assessment/Plan:  1. Type II Diabetes HgbA1c is at goal. CBGs: Not checking-needs glucometer. Episodes of hypoglycemia? no Medication(s): Ozempic 0.5 mg weekly-patient is unable to afford currently.  She is nearly in the donut hole.  Lab Results  Component Value Date   HGBA1C 5.8  (H) 05/11/2021   HGBA1C 6.0 (H) 11/15/2020   HGBA1C 6.2 (H) 07/14/2020   Lab Results  Component Value Date   MICROALBUR 5.2 (H) 05/09/2020   LDLCALC 87 05/11/2021   CREATININE 1.16 (H) 05/11/2021   Plan: I will send prescription for glucometer, lancets, testing strips. Message sent to PCP asking that they assist patient with patient assistance program for Ozempic.   2. Vitamin D Deficiency Vitamin D is at goal of 50. She is on weekly prescription Vitamin D 50,000 IU.  Lab Results  Component Value Date   VD25OH 63.4 05/11/2021   VD25OH 57.1 11/15/2020   VD25OH 24.6 (L) 07/14/2020    Plan: Refill prescription vitamin D 50,000 IU weekly.  3.  Environmental and seasonal allergies Well-controlled with Singulair.  Plan: Refill:  montelukast (SINGULAIR) 10 MG tablet    Sig: Take 1 tablet (10 mg total) by mouth at bedtime.    Dispense:  90 tablet    Refill:  0    4. Obesity: Current BMI 33.45 Samantha Hinton is currently in the action stage of change. As such, her goal is to continue with weight loss efforts.  She has agreed to practicing portion control and making smarter food choices, such as increasing vegetables and decreasing simple carbohydrates.   Exercise goals: No exercise has been prescribed at this time.  Behavioral modification strategies: increasing lean protein intake and decreasing simple carbohydrates.  Samantha Hinton has agreed to follow-up with our clinic in 4 weeks.   No orders of the defined  types were placed in this encounter.   Medications Discontinued During This Encounter  Medication Reason   Vitamin D, Ergocalciferol, (DRISDOL) 1.25 MG (50000 UNIT) CAPS capsule Reorder   montelukast (SINGULAIR) 10 MG tablet Reorder     Meds ordered this encounter  Medications   Vitamin D, Ergocalciferol, (DRISDOL) 1.25 MG (50000 UNIT) CAPS capsule    Sig: Take 1 capsule by mouth every 7 days.    Dispense:  4 capsule    Refill:  0    Ov for rf    Order Specific  Question:   Supervising Provider    Answer:   Quillian Quince D [[id number]]   blood glucose meter kit and supplies KIT    Sig: Dispense based on patient and insurance preference. Use up to four times daily as directed.    Dispense:  1 each    Refill:  0    Order Specific Question:   Supervising Provider    Answer:   Wilder Glade [id number]    Order Specific Question:   Number of strips    Answer:   180    Order Specific Question:   Number of lancets    Answer:   180   montelukast (SINGULAIR) 10 MG tablet    Sig: Take 1 tablet (10 mg total) by mouth at bedtime.    Dispense:  90 tablet    Refill:  0    Order Specific Question:   Supervising Provider    Answer:   Quillian Quince D [[id number]]   Glucose Blood (BLOOD GLUCOSE TEST STRIPS 333) STRP    Sig: 1 strip by In Vitro route in the morning and at bedtime.    Dispense:  200 strip    Refill:  1    Please fill what insurance prefers.    Order Specific Question:   Supervising Provider    Answer:   Wilder Glade [[id number]]   Lancets MISC    Sig: 1 Device by Does not apply route 2 (two) times daily.    Dispense:  200 each    Refill:  1    Please fill what insurance prefers.    Order Specific Question:   Supervising Provider    Answer:   Wilder Glade [[id number]]      Objective:   VITALS: Per patient if applicable, see vitals. GENERAL: Alert and in no acute distress. CARDIOPULMONARY: No increased WOB. Speaking in clear sentences.  PSYCH: Pleasant and cooperative. Speech normal rate and rhythm. Affect is appropriate. Insight and judgement are appropriate. Attention is focused, linear, and appropriate.  NEURO: Oriented as arrived to appointment on time with no prompting.   Lab Results  Component Value Date   CREATININE 1.16 (H) 05/11/2021   BUN 31 (H) 05/11/2021   NA 141 05/11/2021   K 4.4 05/11/2021   CL 95 (L) 05/11/2021   CO2 27 05/11/2021   Lab Results  Component Value Date   ALT 15 05/11/2021   AST 21 05/11/2021    ALKPHOS 112 05/11/2021   BILITOT 0.3 05/11/2021   Lab Results  Component Value Date   HGBA1C 5.8 (H) 05/11/2021   HGBA1C 6.0 (H) 11/15/2020   HGBA1C 6.2 (H) 07/14/2020   HGBA1C 6.0 05/09/2020   HGBA1C 6.9 (H) 08/04/2019   Lab Results  Component Value Date   INSULIN 51.4 (H) 05/11/2021   INSULIN 6.1 07/14/2020   Lab Results  Component Value Date   TSH 3.06 05/09/2020   Lab Results  Component  Value Date   CHOL 184 05/11/2021   HDL 58 05/11/2021   LDLCALC 87 05/11/2021   LDLDIRECT 242.0 05/09/2020   TRIG 238 (H) 05/11/2021   CHOLHDL 3.2 05/11/2021   Lab Results  Component Value Date   WBC 6.2 05/09/2020   HGB 13.6 05/09/2020   HCT 41.2 05/09/2020   MCV 94.1 05/09/2020   PLT 195.0 05/09/2020   Lab Results  Component Value Date   IRON 112 04/10/2019   FERRITIN 160.8 04/10/2019   Lab Results  Component Value Date   VD25OH 63.4 05/11/2021   VD25OH 57.1 11/15/2020   VD25OH 24.6 (L) 07/14/2020    Attestation Statements:   Reviewed by clinician on day of visit: allergies, medications, problem list, medical history, surgical history, family history, social history, and previous encounter notes.

## 2021-07-13 ENCOUNTER — Encounter (INDEPENDENT_AMBULATORY_CARE_PROVIDER_SITE_OTHER): Payer: Self-pay | Admitting: Family Medicine

## 2021-07-13 ENCOUNTER — Other Ambulatory Visit (INDEPENDENT_AMBULATORY_CARE_PROVIDER_SITE_OTHER): Payer: Self-pay | Admitting: Family Medicine

## 2021-07-13 ENCOUNTER — Other Ambulatory Visit: Payer: Self-pay | Admitting: Internal Medicine

## 2021-07-13 ENCOUNTER — Telehealth (INDEPENDENT_AMBULATORY_CARE_PROVIDER_SITE_OTHER): Payer: Medicare Other | Admitting: Family Medicine

## 2021-07-13 ENCOUNTER — Telehealth: Payer: Self-pay | Admitting: Internal Medicine

## 2021-07-13 DIAGNOSIS — E1169 Type 2 diabetes mellitus with other specified complication: Secondary | ICD-10-CM | POA: Diagnosis not present

## 2021-07-13 DIAGNOSIS — J3089 Other allergic rhinitis: Secondary | ICD-10-CM

## 2021-07-13 DIAGNOSIS — E559 Vitamin D deficiency, unspecified: Secondary | ICD-10-CM | POA: Diagnosis not present

## 2021-07-13 DIAGNOSIS — Z6833 Body mass index (BMI) 33.0-33.9, adult: Secondary | ICD-10-CM

## 2021-07-13 DIAGNOSIS — E669 Obesity, unspecified: Secondary | ICD-10-CM | POA: Diagnosis not present

## 2021-07-13 DIAGNOSIS — N1831 Chronic kidney disease, stage 3a: Secondary | ICD-10-CM

## 2021-07-13 DIAGNOSIS — E1122 Type 2 diabetes mellitus with diabetic chronic kidney disease: Secondary | ICD-10-CM

## 2021-07-13 MED ORDER — BLOOD GLUCOSE MONITOR KIT: PACK | 0 refills | Status: AC

## 2021-07-13 MED ORDER — MONTELUKAST SODIUM 10 MG PO TABS: 10.0000 mg | ORAL_TABLET | Freq: Every day | ORAL | 0 refills | Status: DC

## 2021-07-13 MED ORDER — VITAMIN D (ERGOCALCIFEROL) 1.25 MG (50000 UNIT) PO CAPS: 50000.0000 [IU] | ORAL_CAPSULE | ORAL | 0 refills | Status: DC

## 2021-07-13 MED ORDER — LANCETS MISC: 1.0000 | Freq: Two times a day (BID) | 1 refills | Status: AC

## 2021-07-13 MED ORDER — BLOOD GLUCOSE TEST STRIPS 333 VI STRP: 1.0000 | ORAL_STRIP | Freq: Two times a day (BID) | 1 refills | Status: AC

## 2021-07-13 NOTE — Progress Notes (Signed)
Urine culture did not confirm uti   external bacteria  only . If feeling better can still take 5 days of the antibiotic .  We can recheck urinalysis with microscopic urine( at [facility])  ongoing. Problem

## 2021-07-13 NOTE — Telephone Encounter (Signed)
Pt is returning Samantha Hinton call concerning blood work results

## 2021-07-14 ENCOUNTER — Telehealth: Payer: Self-pay | Admitting: Pharmacist

## 2021-07-14 ENCOUNTER — Other Ambulatory Visit: Payer: Self-pay

## 2021-07-14 DIAGNOSIS — R3989 Other symptoms and signs involving the genitourinary system: Secondary | ICD-10-CM

## 2021-07-14 NOTE — Telephone Encounter (Signed)
Medication was ordered 2 weeks ago and has 11 refills place don it. Refill is not appropriate at this time for [place]

## 2021-07-14 NOTE — Telephone Encounter (Signed)
See result note.  

## 2021-07-14 NOTE — Progress Notes (Unsigned)
Chronic Care Management Pharmacy Assistant   Name: Samantha Hinton  MRN: [id number] DOB: 17-Dec-1955  Reason for Encounter: Medication Review / Medication Coordination   Recent office visits:  07/11/21 Burnis Medin, MD - Patient presented for Essential hypertension and other concerns Prescribed Cephalexin 500 mg.   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 No medication changes.   Hospital visits:  None in previous 6 months  Medications: Outpatient Encounter Medications as of 07/14/2021  Medication Sig Note   albuterol (PROAIR HFA) 108 (90 Base) MCG/ACT inhaler 2 puffs every 4 hours as needed only  if your can't catch your breath    albuterol (PROVENTIL) (2.5 MG/3ML) 0.083% nebulizer solution Take 3 mLs (2.5 mg total) by nebulization every 6 (six) hours as needed for wheezing or shortness of breath.    allopurinol (ZYLOPRIM) 100 MG tablet TAKE ONE TABLET BY MOUTH TWICE DAILY    bisoprolol-hydrochlorothiazide (ZIAC) 5-6.25 MG tablet Take 1 tablet by mouth 2 (two) times daily.    blood glucose meter kit and supplies KIT Dispense based on patient and insurance preference. Use up to four times daily as directed.    Budeson-Glycopyrrol-Formoterol (BREZTRI AEROSPHERE) 160-9-4.8 MCG/ACT AERO Inhale 2 puffs into the lungs 2 (two) times daily.    cephALEXin (KEFLEX) 500 MG capsule Take 1 capsule (500 mg total) by mouth 3 (three) times daily. For uti    cloNIDine (CATAPRES) 0.1 MG tablet Take 1 tablet (0.1 mg total) by mouth 2 (two) times daily.    Colchicine 0.6 MG CAPS Take 0.6 mg by mouth 2 (two) times daily as needed. 07/14/2020: Using prn   gabapentin (NEURONTIN) 100 MG capsule Take 2 capsules (200 mg total) by mouth 3 (three) times daily.    Glucose Blood (BLOOD GLUCOSE TEST STRIPS 333) STRP 1 strip by In Vitro route in the morning and at bedtime.    Lancets MISC 1 Device by  Does not apply route 2 (two) times daily.    levocetirizine (XYZAL) 5 MG tablet Take 1 tablet (5 mg total) by mouth every evening.    linaclotide (LINZESS) 145 MCG CAPS capsule Take 1 capsule (145 mcg total) by mouth daily.    montelukast (SINGULAIR) 10 MG tablet Take 1 tablet (10 mg total) by mouth at bedtime.    omeprazole (PRILOSEC) 40 MG capsule TAKE ONE TABLET BY MOUTH TWICE DAILY    ondansetron (ZOFRAN ODT) 4 MG disintegrating tablet Take 1 tablet (4 mg total) by mouth every 8 (eight) hours as needed for nausea or vomiting.    OZEMPIC, 0.25 OR 0.5 MG/DOSE, 2 MG/3ML SOPN Inject 0.5 mg into the skin once a week.    polyethylene glycol (MIRALAX / GLYCOLAX) 17 g packet Take 17 g by mouth 2 (two) times daily. Until stooling regularly    predniSONE (DELTASONE) 10 MG tablet Take  4 each am x 2 days,   2 each am x 2 days,  1 each am x 2 days and stop (Patient taking differently: Take  4 each am x 2 days,   2 each am x 2 days,  1 each am x 2 days and stop)    rosuvastatin (CRESTOR) 10 MG tablet TAKE ONE TABLET BY MOUTH EVERY EVENING    venlafaxine XR (EFFEXOR XR) 75 MG 24 hr capsule Take 1 capsule (75 mg total) by mouth daily with breakfast.    Vitamin  D, Ergocalciferol, (DRISDOL) 1.25 MG (50000 UNIT) CAPS capsule Take 1 capsule by mouth every 7 days.    No facility-administered encounter medications on file as of 07/14/2021.  Reviewed chart for medication changes ahead of medication coordination call.  No OVs, Consults, or hospital visits since last care coordination call/Pharmacist visit.   No medication changes indicated  BP Readings from Last 3 Encounters:  07/11/21 140/80  06/12/21 130/90  06/01/21 120/76    Lab Results  Component Value Date   HGBA1C 5.8 (H) 05/11/2021     Patient obtains medications through Adherence Packaging  30 Days   Last adherence delivery included:  Levocetirizine 10 mg - take 1 tablet daily at dinner Montelukast 10 mg - take 1 tablet at bedtime  Omeprazole  40 mg - take 1 capsule twice daily at breakfast and dinner Clonidine 0.107m - take 1 tablet twice daily at breakfast and dinner Allopurinol 100 mg - take 1 tablet twice daily at breakfast and dinner Rosuvastatin 10 mg - take 1 tablet with dinner Vitamin D 1250 mcg - take 1 capsule once weekly (Wednesday Ozempic 0.5 mg : Inject 0.5 mg once weekly Breztri Inhaler Venlafaxine 75 mg - take 1 capsule daily at breakfast Bisoprolol- HCTZ 5-6.25 MG - take 1 tablet twice daily at breakfast and dinner Gabapentin 100 mg - take 1 capsule twice daily at breakfast and dinner   Patient is due for next adherence delivery on: 07/27/21 . Called patient and reviewed medications and coordinated delivery. Packs 30 DS  This delivery to include: Levocetirizine 10 mg - take 1 tablet daily at dinner Montelukast 10 mg - take 1 tablet at bedtime Omeprazole 40 mg - take 1 capsule twice daily at breakfast and dinner Clonidine 0.163m- take 1 tablet twice daily at breakfast and dinner Allopurinol 100 mg - take 1 tablet twice daily at breakfast and dinner Rosuvastatin 10 mg - take 1 tablet with dinner Vitamin D 1250 mcg - take 1 capsule once weekly (Wednesday Ozempic 0.5 mg : Inject 0.5 mg once weekly Breztri Inhaler Venlafaxine 75 mg - take 1 capsule daily at breakfast Bisoprolol- HCTZ 5-6.25 MG - take 1 tablet twice daily at breakfast and dinner Gabapentin 100 mg - take 2 capsule 3 times daily at breakfast Lunch and dinner  Unable to reach to confirm delivery date of 07/27/21, left message for patient that pharmacy will contact them the morning of delivery.    Care Gaps: PNA Vaccine - Overdue Foot Exam - Postponed Mammogram - Postponed Pap Smear - Postponed Eye Exam - Postponed DEXA - Postponed COVID Booster - Postponed Zoster Vaccine - Postponed Colonoscopy - Postponed BP- 140/80 ( 07/11/21) AWV- 11/22 Lab Results  Component Value Date   HGBA1C 5.8 (H) 05/11/2021   Star Rating Drugs: Rosuvastatin  (Crestor) 10 mg - Last filled 06/22/21 30 DS at [facility] Semaglutide (Ozempic) - Sent to pt [facility]/22/21 [facility] [phone]

## 2021-07-16 ENCOUNTER — Other Ambulatory Visit: Payer: Self-pay | Admitting: Family Medicine

## 2021-07-20 ENCOUNTER — Other Ambulatory Visit: Payer: Self-pay | Admitting: Internal Medicine

## 2021-07-21 ENCOUNTER — Other Ambulatory Visit: Payer: Self-pay

## 2021-07-21 MED ORDER — BISOPROLOL-HYDROCHLOROTHIAZIDE 5-6.25 MG PO TABS: 1.0000 | ORAL_TABLET | Freq: Two times a day (BID) | ORAL | 2 refills | Status: DC

## 2021-07-21 MED ORDER — CLONIDINE HCL 0.1 MG PO TABS: 0.1000 mg | ORAL_TABLET | Freq: Two times a day (BID) | ORAL | 1 refills | Status: DC

## 2021-07-21 NOTE — Progress Notes (Unsigned)
Samantha Hinton [address] [place] Phone: [phone] Subjective:   Samantha Hinton, am serving as a scribe for Dr. Hulan Saas.  I'm seeing this patient by the request  of:  Panosh, Standley Brooking, MD  CC: lumbar and neck pain follow up   [id number]  06/12/2021 Chronic problem with mild exacerbation.  Patient does feel if she takes the medication on a regular basis including Effexor she seems to be doing relatively well.  We will refill the Effexor at this point as well as given gabapentin.  Patient will slowly titrated up but would not want her to do more than 200 mg 3 times a day max.  Patient will follow-up with me again in 6 weeks. Patient is willing her other options which include the possibility of neurosurgery and will be referred appropriately.  Referred to neurosurgery to discuss if anything else needs to be done at this time  Update 07/24/2021 Samantha Hinton is a 66 y.o. female coming in with complaint of lumbar spine and cervical spine pain. Patient states back pain is bearable. Stopped taking mid day dose of gabapentin. Recent death of partner. Seeing psych. Staying with son and family.      Past Medical History:  Diagnosis Date   Anxiety    Asthma    Chronic back pain    buldging disc and tumor.Spondylolisthesis   COPD (chronic obstructive pulmonary disease) (HCC)    Symbicort daily and ProAir as needed   Depression    Emphysema    Emphysema of lung ([place])    Fatty liver 04/13/2019   Fatty liver    Fibromyalgia    Gallbladder problem    Genital warts    GERD (gastroesophageal reflux disease)    takes Omeprazole daily   Heartburn    History of bronchitis 01/2014   History of colon polyps    Hyperlipidemia    has been off of meds d/t getting sick from them.Not been addressed again.    Hypertension    takes Ziac daily   Nocturia    Pneumonia    hx of-8+yrs ago   PONV (postoperative nausea and vomiting)     Shortness of breath dyspnea    thinks d/t pain meds and notices with exertion   SOBOE (shortness of breath on exertion)    Stroke ([place])    16 yrs ago--left eye    Swallowing difficulty    Past Surgical History:  Procedure Laterality Date   ABDOMINAL HYSTERECTOMY  1983   partial    ADENOIDECTOMY     ANTERIOR (CYSTOCELE) AND POSTERIOR REPAIR (RECTOCELE) WITH XENFORM GRAFT AND SACROSPINOUS FIXATION     APPENDECTOMY  1967   BACK SURGERY  2017   lower back   BREAST SURGERY Right    diseased milk glands   CHOLECYSTECTOMY  1983   COLONOSCOPY  04/13/2019   DIAGNOSTIC LAPAROSCOPY     cyst removed from ovaries    ESOPHAGOGASTRODUODENOSCOPY     lypoma  2011   NECK SURGERY  [phone]   c spine ant and post hx fusion and removal or harcdware and shavings   POLYPECTOMY     rods and screws in lumbar     TONSILLECTOMY     UPPER GASTROINTESTINAL ENDOSCOPY  04/13/2019   Social History   Socioeconomic History   Marital status: Significant Other    Spouse name: [name]   Number of children: 2   Years of education: Not on  file   Highest education level: Not on file  Occupational History   Occupation: retired    Fish farm manager: [name]  Tobacco Use   Smoking status: Former    Packs/day: 0.25    Years: 44.00    Pack years: 11.00    Types: Cigarettes    Quit date: 02/04/2014    Years since quitting: 7.4   Smokeless tobacco: Never   Tobacco comments:    quit smoking in Dec 2015  Vaping Use   Vaping Use: Former  Substance and Sexual Activity   Alcohol use: Yes    Alcohol/week: 4.0 standard drinks    Types: 4 Standard drinks or equivalent per week    Comment: couple times a week   Drug use: No   Sexual activity: Not Currently    Birth control/protection: Surgical    Comment: 1st intercourse 66 yo-Fewer than 5 partners  Other Topics Concern   Not on file  Social History Narrative   6-8 hours of sleep per night   Lives with her fiance hh of 2 no pets    1 dog in the  home   Retired no ets  But tob 8 per day   etoh ocass 1-2    On disability from her neck surgery predicaments .   orig from [employer] in [place] area.    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 2   Last Pap 2006 last mammogram 2000 and   Has dentures   FA    Social Determinants of Health   Financial Resource Strain: Medium Risk   Difficulty of Paying Living Expenses: Somewhat hard  Food Insecurity: No Food Insecurity   Worried About Charity fundraiser in the Last Year: Never true   Ran Out of Food in the Last Year: Never true  Transportation Needs: No Transportation Needs   Lack of Transportation (Medical): No   Lack of Transportation (Non-Medical): No  Physical Activity: Inactive   Days of Exercise per Week: 0 days   Minutes of Exercise per Session: 0 min  Stress: No Stress Concern Present   Feeling of Stress : Only a little  Social Connections: Socially Isolated   Frequency of Communication with Friends and Family: Twice a week   Frequency of Social Gatherings with Friends and Family: Three times a week   Attends Religious Services: Never   Active Member of Clubs or Organizations: No   Attends Music therapist: Never   Marital Status: Divorced   Allergies  Allergen Reactions   Wellbutrin [Bupropion] Nausea And Vomiting   Family History  Problem Relation Age of Onset   CAD Father        tumor between heart and lung    Hypertension Father    Cancer Father    CAD Sister        ? dx    Hypertension Mother    Lung cancer Mother        died 93    Diabetes Maternal Grandmother        late 79s    Colon cancer Neg Hx    Colon polyps Neg Hx    Esophageal cancer Neg Hx    Rectal cancer Neg Hx    Stomach cancer Neg Hx     Current Outpatient Medications (Endocrine & Metabolic):    OZEMPIC, 0.25 OR 0.5 MG/DOSE, 2 MG/3ML SOPN, Inject 0.5 mg into the skin once a week.   predniSONE (DELTASONE) 10  MG tablet, Take  4 each am x 2 days,   2  each am x 2 days,  1 each am x 2 days and stop (Patient taking differently: Take  4 each am x 2 days,   2 each am x 2 days,  1 each am x 2 days and stop)  Current Outpatient Medications (Cardiovascular):    bisoprolol-hydrochlorothiazide (ZIAC) 5-6.25 MG tablet, Take 1 tablet by mouth 2 (two) times daily.   cloNIDine (CATAPRES) 0.1 MG tablet, Take 1 tablet (0.1 mg total) by mouth 2 (two) times daily.   rosuvastatin (CRESTOR) 10 MG tablet, TAKE ONE TABLET BY MOUTH EVERY EVENING  Current Outpatient Medications (Respiratory):    albuterol (PROAIR HFA) 108 (90 Base) MCG/ACT inhaler, 2 puffs every 4 hours as needed only  if your can't catch your breath   albuterol (PROVENTIL) (2.5 MG/3ML) 0.083% nebulizer solution, Take 3 mLs (2.5 mg total) by nebulization every 6 (six) hours as needed for wheezing or shortness of breath.   Budeson-Glycopyrrol-Formoterol (BREZTRI AEROSPHERE) 160-9-4.8 MCG/ACT AERO, Inhale 2 puffs into the lungs 2 (two) times daily.   levocetirizine (XYZAL) 5 MG tablet, Take 1 tablet (5 mg total) by mouth every evening.   montelukast (SINGULAIR) 10 MG tablet, Take 1 tablet (10 mg total) by mouth at bedtime.  Current Outpatient Medications (Analgesics):    allopurinol (ZYLOPRIM) 100 MG tablet, TAKE ONE TABLET BY MOUTH TWICE DAILY   Colchicine 0.6 MG CAPS, Take 0.6 mg by mouth 2 (two) times daily as needed.   Current Outpatient Medications (Other):    blood glucose meter kit and supplies KIT, Dispense based on patient and insurance preference. Use up to four times daily as directed.   cephALEXin (KEFLEX) 500 MG capsule, Take 1 capsule (500 mg total) by mouth 3 (three) times daily. For uti   gabapentin (NEURONTIN) 100 MG capsule, Take 2 capsules (200 mg total) by mouth 3 (three) times daily.   Glucose Blood (BLOOD GLUCOSE TEST STRIPS 333) STRP, 1 strip by In Vitro route in the morning and at bedtime.   Lancets MISC, 1 Device by Does not apply route 2 (two) times daily.   linaclotide  (LINZESS) 145 MCG CAPS capsule, Take 1 capsule (145 mcg total) by mouth daily.   omeprazole (PRILOSEC) 40 MG capsule, TAKE ONE TABLET BY MOUTH TWICE DAILY   ondansetron (ZOFRAN ODT) 4 MG disintegrating tablet, Take 1 tablet (4 mg total) by mouth every 8 (eight) hours as needed for nausea or vomiting.   polyethylene glycol (MIRALAX / GLYCOLAX) 17 g packet, Take 17 g by mouth 2 (two) times daily. Until stooling regularly   venlafaxine XR (EFFEXOR-XR) 75 MG 24 hr capsule, TAKE ONE CAPSULE BY MOUTH EVERY MORNING   Vitamin D, Ergocalciferol, (DRISDOL) 1.25 MG (50000 UNIT) CAPS capsule, Take 1 capsule by mouth every 7 days.   Reviewed prior external information including notes and imaging from  primary care provider As well as notes that were available from care everywhere and other healthcare systems.  Past medical history, social, surgical and family history all reviewed in electronic medical record.  No pertanent information unless stated regarding to the chief complaint.   Review of Systems:  No headache, visual changes, nausea, vomiting, diarrhea, constipation, dizziness, abdominal pain, skin rash, fevers, chills, night sweats, weight loss, swollen lymph nodes, body aches, joint swelling, chest pain, shortness of breath, mood changes. POSITIVE muscle aches  Objective  Blood pressure (!) 144/82, pulse 80, height 5' 5"  (1.651 m), weight 214 lb (  97.1 kg), SpO2 95 %.   General: Very anxious and patient usually is in tearful which is completely understandable HEENT: Pupils equal, extraocular movements intact  Respiratory: Patient's speak in full sentences and does not appear short of breath  Cardiovascular: No lower extremity edema, non tender, no erythema  Gait mild antalgic MSK: Back exam loss of lordosis.  Patient is sitting rather comfortably.    Impression and Recommendations:     The above documentation has been reviewed and is accurate and complete Lyndal Pulley, DO

## 2021-07-24 ENCOUNTER — Telehealth (INDEPENDENT_AMBULATORY_CARE_PROVIDER_SITE_OTHER): Payer: Self-pay

## 2021-07-24 ENCOUNTER — Other Ambulatory Visit (INDEPENDENT_AMBULATORY_CARE_PROVIDER_SITE_OTHER): Payer: Self-pay | Admitting: Family Medicine

## 2021-07-24 ENCOUNTER — Ambulatory Visit: Payer: Medicare Other | Admitting: Family Medicine

## 2021-07-24 ENCOUNTER — Telehealth: Payer: Self-pay

## 2021-07-24 DIAGNOSIS — J3089 Other allergic rhinitis: Secondary | ICD-10-CM

## 2021-07-24 DIAGNOSIS — M4316 Spondylolisthesis, lumbar region: Secondary | ICD-10-CM

## 2021-07-24 DIAGNOSIS — E559 Vitamin D deficiency, unspecified: Secondary | ICD-10-CM

## 2021-07-24 MED ORDER — BISOPROLOL-HYDROCHLOROTHIAZIDE 5-6.25 MG PO TABS: 1.0000 | ORAL_TABLET | Freq: Two times a day (BID) | ORAL | 2 refills | Status: DC

## 2021-07-24 MED ORDER — CLONIDINE HCL 0.1 MG PO TABS: 0.1000 mg | ORAL_TABLET | Freq: Two times a day (BID) | ORAL | 1 refills | Status: DC

## 2021-07-24 MED ORDER — LEVOCETIRIZINE DIHYDROCHLORIDE 5 MG PO TABS: 5.0000 mg | ORAL_TABLET | Freq: Every evening | ORAL | 0 refills | Status: DC

## 2021-07-24 NOTE — Assessment & Plan Note (Addendum)
Patient has been doing very well with the Effexor.  Recently though did have the passing of her partner.  Unfortunately secondary to that she has not been able to do as much of the exercises.  Has decreased some of her gabapentin as well.  Follow-up with me again in 6 to 8 weeks total time reviewing patient's chart, discussing with patient about life and other treatment options 31 minutes

## 2021-07-24 NOTE — Telephone Encounter (Signed)
LAST APPOINTMENT DATE: 07/13/21 NEXT APPOINTMENT DATE: 08/10/21   [facility] - [place], [place] - [address] [place] [address] Phone: [phone] Fax: [phone]  [place] [place] [address] Phone: [phone] Fax: [phone]  Patient is requesting a refill of the following medications: Pending Prescriptions:                       Disp   Refills   levocetirizine (XYZAL) 5 MG tablet         30 tab*0       Sig: Take 1 tablet (5 mg total) by mouth every evening.   Date last filled: 06/01/21 Previously prescribed by [facility]  Lab Results      Component                Value               Date                      HGBA1C                   5.8 (H)             05/11/2021                HGBA1C                   6.0 (H)             11/15/2020                HGBA1C                   6.2 (H)             07/14/2020           Lab Results      Component                Value               Date                      MICROALBUR               5.2 (H)             05/09/2020                LDLCALC                  87                  05/11/2021                CREATININE               1.16 (H)            05/11/2021           Lab Results      Component                Value               Date                      VD25OH  63.4                05/11/2021                VD25OH                   57.1                11/15/2020                VD25OH                   24.6 (L)            07/14/2020            BP Readings from Last 3 Encounters: 07/24/21 : (!) 144/82 07/11/21 : 140/80 06/12/21 : 130/90

## 2021-07-24 NOTE — Telephone Encounter (Signed)
-----   Message from Viona Gilmore, [facility] sent at 07/21/2021 10:15 AM EDT ----- Regarding: Refills Hi,  Can you please send refills of clonidine and bisoprolol-HCTZ to [facility]? It looks like the refill request was denied by the system saying responded to by other means but the pharmacy never received the refills. If possible, can you send with more refills as well? Last time they were only sent in for 30 ds with 0 refills.  Thank you! Maddie

## 2021-07-24 NOTE — Patient Instructions (Signed)
Good to see you! So sorry for your loss We are here if you need anything See you again in 3 months or sooner if you need [place]

## 2021-07-24 NOTE — Telephone Encounter (Signed)
See my chart message

## 2021-07-25 ENCOUNTER — Telehealth: Payer: Self-pay | Admitting: Internal Medicine

## 2021-07-26 NOTE — Telephone Encounter (Signed)
Called and spoke to pt. Pt states she no longer needs the [place] script sent to the pharmacy as she received 3 months from pt assistance. Pt states she also received a letter with the inhalers and was unsure of what she needs to do with the letter and would like to bring it by the office for clinical staff to look at. Pt states she will bring this by later this week.   Pt also wanted to thank [facility] for her help in May with her sample inhalers. Pt was extremely appreciative of Mandi and her help and sincerity that day. Will forward to [facility] as FYI.

## 2021-08-03 ENCOUNTER — Telehealth: Payer: Self-pay | Admitting: Pharmacist

## 2021-08-03 NOTE — Progress Notes (Cosign Needed)
Per Jeni Salles Patient Assistance Application pre filled out for Ozempic to be mailed to patient with instructions for completion.    [facility] Clinical Pharmacist Assistant [phone]

## 2021-08-10 ENCOUNTER — Telehealth (INDEPENDENT_AMBULATORY_CARE_PROVIDER_SITE_OTHER): Payer: Medicare Other | Admitting: Family Medicine

## 2021-08-15 ENCOUNTER — Other Ambulatory Visit (INDEPENDENT_AMBULATORY_CARE_PROVIDER_SITE_OTHER): Payer: Self-pay | Admitting: Family Medicine

## 2021-08-15 ENCOUNTER — Other Ambulatory Visit: Payer: Self-pay | Admitting: Internal Medicine

## 2021-08-15 ENCOUNTER — Other Ambulatory Visit: Payer: Self-pay | Admitting: Family Medicine

## 2021-08-15 DIAGNOSIS — E559 Vitamin D deficiency, unspecified: Secondary | ICD-10-CM

## 2021-08-16 ENCOUNTER — Telehealth (INDEPENDENT_AMBULATORY_CARE_PROVIDER_SITE_OTHER): Payer: Self-pay

## 2021-08-16 ENCOUNTER — Encounter (INDEPENDENT_AMBULATORY_CARE_PROVIDER_SITE_OTHER): Payer: Self-pay

## 2021-08-16 ENCOUNTER — Encounter: Payer: Self-pay | Admitting: Internal Medicine

## 2021-08-16 ENCOUNTER — Telehealth: Payer: Self-pay | Admitting: Internal Medicine

## 2021-08-16 ENCOUNTER — Ambulatory Visit (INDEPENDENT_AMBULATORY_CARE_PROVIDER_SITE_OTHER): Payer: Medicare Other | Admitting: Internal Medicine

## 2021-08-16 ENCOUNTER — Telehealth: Payer: Self-pay | Admitting: Pharmacist

## 2021-08-16 VITALS — BP 138/80 | HR 76 | Temp 98.1°F | Ht 65.0 in | Wt 210.4 lb

## 2021-08-16 DIAGNOSIS — Z6839 Body mass index (BMI) 39.0-39.9, adult: Secondary | ICD-10-CM

## 2021-08-16 DIAGNOSIS — N1831 Chronic kidney disease, stage 3a: Secondary | ICD-10-CM

## 2021-08-16 DIAGNOSIS — E1122 Type 2 diabetes mellitus with diabetic chronic kidney disease: Secondary | ICD-10-CM

## 2021-08-16 DIAGNOSIS — Z634 Disappearance and death of family member: Secondary | ICD-10-CM | POA: Diagnosis not present

## 2021-08-16 DIAGNOSIS — F4320 Adjustment disorder, unspecified: Secondary | ICD-10-CM | POA: Diagnosis not present

## 2021-08-16 DIAGNOSIS — Z79899 Other long term (current) drug therapy: Secondary | ICD-10-CM | POA: Diagnosis not present

## 2021-08-16 DIAGNOSIS — F4322 Adjustment disorder with anxiety: Secondary | ICD-10-CM

## 2021-08-16 DIAGNOSIS — R3989 Other symptoms and signs involving the genitourinary system: Secondary | ICD-10-CM

## 2021-08-16 DIAGNOSIS — I10 Essential (primary) hypertension: Secondary | ICD-10-CM

## 2021-08-16 LAB — POCT URINALYSIS DIPSTICK
Bilirubin, UA: POSITIVE
Blood, UA: NEGATIVE
Glucose, UA: NEGATIVE
Ketones, UA: NEGATIVE
Nitrite, UA: POSITIVE
Spec Grav, UA: 1.025 (ref 1.010–1.025)
Urobilinogen, UA: NEGATIVE E.U./dL — AB
pH, UA: 6 (ref 5.0–8.0)

## 2021-08-16 LAB — POCT GLYCOSYLATED HEMOGLOBIN (HGB A1C): Hemoglobin A1C: 5.7 % — AB (ref 4.0–5.6)

## 2021-08-16 MED ORDER — NITROFURANTOIN MONOHYD MACRO 100 MG PO CAPS: 100.0000 mg | ORAL_CAPSULE | Freq: Two times a day (BID) | ORAL | 0 refills | Status: AC

## 2021-08-16 MED ORDER — BREZTRI AEROSPHERE 160-9-4.8 MCG/ACT IN AERO: 2.0000 | INHALATION_SPRAY | Freq: Two times a day (BID) | RESPIRATORY_TRACT | 11 refills | Status: DC

## 2021-08-16 NOTE — Telephone Encounter (Signed)
Patient is scheduled for 08/23/21 with Dawn. Patient needs a refill for her Vitamin D, Ergocalciferol, (DRISDOL) 1.25 MG (50000 UNIT) CAPS capsule.  Patient would like it sent to:  [facility] - [place], [place] - [address] Phone:  [phone]  Fax:  [phone]

## 2021-08-16 NOTE — Chronic Care Management (AMB) (Signed)
Chronic Care Management Pharmacy Assistant   Name: Samantha Hinton  MRN: [id number] DOB: September 27, 1955  Reason for Encounter: Medication Review Medication Coordination  Recent office visits:  08/16/21 Panosh, Standley Brooking, MD - Patient presented for Possible UTI and other concerns.Prescribed Nitrofurantoin Monohyd Macro.   Recent consult visits:  None  Hospital visits:  None in previous 6 months  Medications: Outpatient Encounter Medications as of 08/16/2021  Medication Sig Note   albuterol (PROAIR HFA) 108 (90 Base) MCG/ACT inhaler 2 puffs every 4 hours as needed only  if your can't catch your breath    albuterol (PROVENTIL) (2.5 MG/3ML) 0.083% nebulizer solution Take 3 mLs (2.5 mg total) by nebulization every 6 (six) hours as needed for wheezing or shortness of breath.    allopurinol (ZYLOPRIM) 100 MG tablet TAKE ONE TABLET BY MOUTH TWICE DAILY    bisoprolol-hydrochlorothiazide (ZIAC) 5-6.25 MG tablet Take 1 tablet by mouth 2 (two) times daily.    blood glucose meter kit and supplies KIT Dispense based on patient and insurance preference. Use up to four times daily as directed.    Budeson-Glycopyrrol-Formoterol (BREZTRI AEROSPHERE) 160-9-4.8 MCG/ACT AERO Inhale 2 puffs into the lungs 2 (two) times daily.    cephALEXin (KEFLEX) 500 MG capsule Take 1 capsule (500 mg total) by mouth 3 (three) times daily. For uti    cloNIDine (CATAPRES) 0.1 MG tablet Take 1 tablet (0.1 mg total) by mouth 2 (two) times daily.    Colchicine 0.6 MG CAPS Take 0.6 mg by mouth 2 (two) times daily as needed. 07/14/2020: Using prn   gabapentin (NEURONTIN) 100 MG capsule Take 2 capsules (200 mg total) by mouth 3 (three) times daily.    Glucose Blood (BLOOD GLUCOSE TEST STRIPS 333) STRP 1 strip by In Vitro route in the morning and at bedtime.    Lancets MISC 1 Device by Does not apply route 2 (two) times daily.    levocetirizine (XYZAL) 5 MG tablet Take 1 tablet (5 mg total) by mouth every evening.    linaclotide  (LINZESS) 145 MCG CAPS capsule Take 1 capsule (145 mcg total) by mouth daily.    montelukast (SINGULAIR) 10 MG tablet Take 1 tablet (10 mg total) by mouth at bedtime.    omeprazole (PRILOSEC) 40 MG capsule TAKE ONE TABLET BY MOUTH TWICE DAILY    ondansetron (ZOFRAN ODT) 4 MG disintegrating tablet Take 1 tablet (4 mg total) by mouth every 8 (eight) hours as needed for nausea or vomiting.    OZEMPIC, 0.25 OR 0.5 MG/DOSE, 2 MG/3ML SOPN Inject 0.5 mg into the skin once a week.    polyethylene glycol (MIRALAX / GLYCOLAX) 17 g packet Take 17 g by mouth 2 (two) times daily. Until stooling regularly    predniSONE (DELTASONE) 10 MG tablet Take  4 each am x 2 days,   2 each am x 2 days,  1 each am x 2 days and stop (Patient taking differently: Take  4 each am x 2 days,   2 each am x 2 days,  1 each am x 2 days and stop)    rosuvastatin (CRESTOR) 10 MG tablet TAKE ONE TABLET BY MOUTH EVERY EVENING    venlafaxine XR (EFFEXOR-XR) 75 MG 24 hr capsule TAKE ONE CAPSULE BY MOUTH EVERY MORNING    Vitamin D, Ergocalciferol, (DRISDOL) 1.25 MG (50000 UNIT) CAPS capsule Take 1 capsule by mouth every 7 days.    No facility-administered encounter medications on file as of 08/16/2021.  Reviewed chart for  medication changes ahead of medication coordination call.  No OVs, Consults, or hospital visits since last care coordination call/Pharmacist visit.   No medication changes indicated.  BP Readings from Last 3 Encounters:  08/16/21 138/80  07/24/21 (!) 144/82  07/11/21 140/80    Lab Results  Component Value Date   HGBA1C 5.8 (H) 05/11/2021     Patient obtains medications through Adherence Packaging  30 Days   Last adherence delivery included:  Levocetirizine 10 mg - take 1 tablet daily at dinner Montelukast 10 mg - take 1 tablet at bedtime Omeprazole 40 mg - take 1 capsule twice daily at breakfast and dinner Clonidine 0.56m - take 1 tablet twice daily at breakfast and dinner Allopurinol 100 mg - take 1 tablet  twice daily at breakfast and dinner Rosuvastatin 10 mg - take 1 tablet with dinner Vitamin D 1250 mcg - take 1 capsule once weekly (Wednesday Ozempic 0.5 mg : Inject 0.5 mg once weekly Breztri Inhaler Venlafaxine 75 mg - take 1 capsule daily at breakfast Bisoprolol- HCTZ 5-6.25 MG - take 1 tablet twice daily at breakfast and dinner Gabapentin 100 mg - take 2 capsule 3 times daily at breakfast Lunch and dinner   Unable to reach to confirm delivery date of 07/27/21, left message for patient that pharmacy will contact them the morning of delivery.   Patient is due for next adherence delivery on: 08/28/21. Called patient and reviewed medications and coordinated delivery. Packs 30 DS  This delivery to include: Levocetirizine 10 mg - take 1 tablet daily at dinner Montelukast 10 mg - take 1 tablet at bedtime Omeprazole 40 mg - take 1 capsule twice daily at breakfast and dinner Clonidine 0.135m- take 1 tablet twice daily at breakfast and dinner Allopurinol 100 mg - take 1 tablet twice daily at breakfast and dinner Rosuvastatin 10 mg - take 1 tablet with dinner Vitamin D 1250 mcg - take 1 capsule once weekly (Wednesday) Requested script from prescribing office Venlafaxine 75 mg - take 1 capsule daily at breakfast Bisoprolol- HCTZ 5-6.25 MG - take 1 tablet twice daily at breakfast and dinner Gabapentin 100 mg - take 2 capsule 3 times daily at breakfast Lunch and dinner  Ozempic 0.5 mg : Inject 0.5 mg once weekly Unable to reach to confirm patient needs  Test strips and Lancets due to be delivered 10/21/21  Breztri Inhaler - requested prescription be sent over from Provider    Unable to reach to confirm delivery date of 08/28/21,  left msg  advised patient that pharmacy will contact them the morning of delivery.   Care Gaps: PNA Vaccine - Overdue Foot Exam - Postponed Mammogram - Postponed Pap Smear - Postponed Eye Exam - Postponed DEXA - Postponed COVID Booster - Postponed Zoster Vaccine  - Postponed Colonoscopy - Postponed BP- 138/80( 07/11/21) AWV- 11/22 Lab Results  Component Value Date   HGBA1C 5.7 (A) 08/16/2021    Star Rating Drugs: Rosuvastatin (Crestor) 10 mg - Last filled 07/24/21 30 DS at [facility] Semaglutide (Ozempic) - Patient declined last delivery 06/2021   [facility]armacist Assistant [phone]

## 2021-08-16 NOTE — Telephone Encounter (Signed)
Breztri refills sent into [facility] pharmacy. Nothing further needed

## 2021-08-16 NOTE — Progress Notes (Signed)
Chief Complaint  Patient presents with   Follow-up    Not emptying bladder a lot of pressure keeps pt up at night started couple weeks ago     HPI: Samantha Hinton 66 y.o. come in for Chronic disease management , number of issues .   Acute bereavement  packing up townhouse   Living with son and his family .  9 and 11 agedgrandkids.  Has upcoming appointment in July with psych son feels that it is too far ahead however she is not doing any worse is busy helping take care of the household and the grandchildren in the short run BP getting better  back on medication.  Weight management eating better at her son's health hard to limit portion size is overdue for or blood sugar check monitors are packed up. Breathing stable no new symptoms UTI symptoms of frequency urgency has gotten worse again in the last 2 weeks the previous antibiotic did help urine culture was not confirmatory.  Wonders if she needs to see urologist has a history of ureterocele and rectocele.  ROS: See pertinent positives and negatives per HPI.puppy lab in home grets forarm scrathces    Past Medical History:  Diagnosis Date   Anxiety    Asthma    Chronic back pain    buldging disc and tumor.Spondylolisthesis   COPD (chronic obstructive pulmonary disease) (HCC)    Symbicort daily and ProAir as needed   Depression    Emphysema    Emphysema of lung ([place])    Fatty liver 04/13/2019   Fatty liver    Fibromyalgia    Gallbladder problem    Genital warts    GERD (gastroesophageal reflux disease)    takes Omeprazole daily   Heartburn    History of bronchitis 01/2014   History of colon polyps    Hyperlipidemia    has been off of meds d/t getting sick from them.Not been addressed again.    Hypertension    takes Ziac daily   Nocturia    Pneumonia    hx of-8+yrs ago   PONV (postoperative nausea and vomiting)    Shortness of breath dyspnea    thinks d/t pain meds and notices with exertion   SOBOE (shortness of  breath on exertion)    Stroke ([place])    16 yrs ago--left eye    Swallowing difficulty     Family History  Problem Relation Age of Onset   CAD Father        tumor between heart and lung    Hypertension Father    Cancer Father    CAD Sister        ? dx    Hypertension Mother    Lung cancer Mother        died 53    Diabetes Maternal Grandmother        late 22s    Colon cancer Neg Hx    Colon polyps Neg Hx    Esophageal cancer Neg Hx    Rectal cancer Neg Hx    Stomach cancer Neg Hx     Social History   Socioeconomic History   Marital status: Significant Other    Spouse name: [name]   Number of children: 2   Years of education: Not on file   Highest education level: Not on file  Occupational History   Occupation: retired    Fish farm manager: [name]  Tobacco Use   Smoking status: Former    Packs/day: 0.25  Years: 44.00    Total pack years: 11.00    Types: Cigarettes    Quit date: 02/04/2014    Years since quitting: 7.5   Smokeless tobacco: Never   Tobacco comments:    quit smoking in Dec 2015  Vaping Use   Vaping Use: Former  Substance and Sexual Activity   Alcohol use: Yes    Alcohol/week: 4.0 standard drinks of alcohol    Types: 4 Standard drinks or equivalent per week    Comment: couple times a week   Drug use: No   Sexual activity: Not Currently    Birth control/protection: Surgical    Comment: 1st intercourse 66 yo-Fewer than 5 partners  Other Topics Concern   Not on file  Social History Narrative   6-8 hours of sleep per night   Lives with her fiance hh of 2 no pets    1 dog in the home   Retired no ets  But tob 8 per day   etoh ocass 1-2    On disability from her neck surgery predicaments .   orig from [employer] in [place] area.    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 2   Last Pap 2006 last mammogram 2000 and   Has dentures   FA    Social Determinants of Health   Financial Resource Strain: Medium Risk  (12/21/2020)   Overall Financial Resource Strain (CARDIA)    Difficulty of Paying Living Expenses: Somewhat hard  Food Insecurity: No Food Insecurity (12/21/2020)   Hunger Vital Sign    Worried About Running Out of Food in the Last Year: Never true    Ran Out of Food in the Last Year: Never true  Transportation Needs: No Transportation Needs (12/21/2020)   PRAPARE - Hydrologist (Medical): No    Lack of Transportation (Non-Medical): No  Physical Activity: Inactive (12/21/2020)   Exercise Vital Sign    Days of Exercise per Week: 0 days    Minutes of Exercise per Session: 0 min  Stress: No Stress Concern Present (12/21/2020)   [place]    Feeling of Stress : Only a little  Social Connections: Socially Isolated (12/21/2020)   Social Connection and Isolation Panel [NHANES]    Frequency of Communication with Friends and Family: Twice a week    Frequency of Social Gatherings with Friends and Family: Three times a week    Attends Religious Services: Never    Active Member of Clubs or Organizations: No    Attends Archivist Meetings: Never    Marital Status: Divorced    Outpatient Medications Prior to Visit  Medication Sig Dispense Refill   albuterol (PROAIR HFA) 108 (90 Base) MCG/ACT inhaler 2 puffs every 4 hours as needed only  if your can't catch your breath 18 g 11   albuterol (PROVENTIL) (2.5 MG/3ML) 0.083% nebulizer solution Take 3 mLs (2.5 mg total) by nebulization every 6 (six) hours as needed for wheezing or shortness of breath. 75 mL 12   allopurinol (ZYLOPRIM) 100 MG tablet TAKE ONE TABLET BY MOUTH TWICE DAILY 60 tablet 1   bisoprolol-hydrochlorothiazide (ZIAC) 5-6.25 MG tablet Take 1 tablet by mouth 2 (two) times daily. 180 tablet 2   blood glucose meter kit and supplies KIT Dispense based on patient and insurance preference. Use up to four times daily as directed. 1 each 0    cephALEXin (KEFLEX)  500 MG capsule Take 1 capsule (500 mg total) by mouth 3 (three) times daily. For uti 21 capsule 0   cloNIDine (CATAPRES) 0.1 MG tablet Take 1 tablet (0.1 mg total) by mouth 2 (two) times daily. 180 tablet 1   Colchicine 0.6 MG CAPS Take 0.6 mg by mouth 2 (two) times daily as needed. 60 capsule 1   gabapentin (NEURONTIN) 100 MG capsule Take 2 capsules (200 mg total) by mouth 3 (three) times daily. 180 capsule 3   Glucose Blood (BLOOD GLUCOSE TEST STRIPS 333) STRP 1 strip by In Vitro route in the morning and at bedtime. 200 strip 1   Lancets MISC 1 Device by Does not apply route 2 (two) times daily. 200 each 1   levocetirizine (XYZAL) 5 MG tablet Take 1 tablet (5 mg total) by mouth every evening. 30 tablet 0   linaclotide (LINZESS) 145 MCG CAPS capsule Take 1 capsule (145 mcg total) by mouth daily. 30 capsule 0   montelukast (SINGULAIR) 10 MG tablet Take 1 tablet (10 mg total) by mouth at bedtime. 90 tablet 0   omeprazole (PRILOSEC) 40 MG capsule TAKE ONE TABLET BY MOUTH TWICE DAILY 60 capsule 5   ondansetron (ZOFRAN ODT) 4 MG disintegrating tablet Take 1 tablet (4 mg total) by mouth every 8 (eight) hours as needed for nausea or vomiting. 20 tablet 0   OZEMPIC, 0.25 OR 0.5 MG/DOSE, 2 MG/3ML SOPN Inject 0.5 mg into the skin once a week. 3 mL 0   polyethylene glycol (MIRALAX / GLYCOLAX) 17 g packet Take 17 g by mouth 2 (two) times daily. Until stooling regularly 60 packet 0   predniSONE (DELTASONE) 10 MG tablet Take  4 each am x 2 days,   2 each am x 2 days,  1 each am x 2 days and stop (Patient taking differently: Take  4 each am x 2 days,   2 each am x 2 days,  1 each am x 2 days and stop) 14 tablet 11   rosuvastatin (CRESTOR) 10 MG tablet TAKE ONE TABLET BY MOUTH EVERY EVENING 90 tablet 1   venlafaxine XR (EFFEXOR-XR) 75 MG 24 hr capsule TAKE ONE CAPSULE BY MOUTH EVERY MORNING 15 capsule 2   Vitamin D, Ergocalciferol, (DRISDOL) 1.25 MG (50000 UNIT) CAPS capsule Take 1 capsule by  mouth every 7 days. 4 capsule 0   Budeson-Glycopyrrol-Formoterol (BREZTRI AEROSPHERE) 160-9-4.8 MCG/ACT AERO Inhale 2 puffs into the lungs 2 (two) times daily. 10.7 g 11   No facility-administered medications prior to visit.     EXAM:  BP 138/80 (BP Location: Right Arm)   Pulse 76   Temp 98.1 F (36.7 C) (Oral)   Ht _0  (1.651 m)   Wt 210 lb 6.4 oz (95.4 kg)   SpO2 97%   BMI 35.01 kg/m   Body mass index is 35.01 kg/m. Wt Readings from Last 3 Encounters:  08/16/21 210 lb 6.4 oz (95.4 kg)  07/24/21 214 lb (97.1 kg)  07/11/21 206 lb 12.8 oz (93.8 kg)    GENERAL: vitals reviewed and listed above, alert, oriented, appears well hydrated and in no acute distress HEENT: atraumatic, conjunctiva  clear, no obvious abnormalities on inspection of external nose and ears NECK: no obvious masses on inspection palpation  LUNGS: clear to auscultation bilaterally, no wheezes, rales or rhonchi, good air movement CV: HRRR, no clubbing cyanosis or  peripheral edema nl cap refill  MS: moves all extremities without noticeable focal  abnormality skin  small skin  avulsion right hand and "senile ecchymosis" forearm PSYCH: pleasant and cooperative normal cognition normal emotion discussion Lab Results  Component Value Date   WBC 6.2 05/09/2020   HGB 13.6 05/09/2020   HCT 41.2 05/09/2020   PLT 195.0 05/09/2020   GLUCOSE 106 (H) 05/11/2021   CHOL 184 05/11/2021   TRIG 238 (H) 05/11/2021   HDL 58 05/11/2021   LDLDIRECT 242.0 05/09/2020   LDLCALC 87 05/11/2021   ALT 15 05/11/2021   AST 21 05/11/2021   NA 141 05/11/2021   K 4.4 05/11/2021   CL 95 (L) 05/11/2021   CREATININE 1.16 (H) 05/11/2021   BUN 31 (H) 05/11/2021   CO2 27 05/11/2021   TSH 3.06 05/09/2020   HGBA1C 5.7 (A) 08/16/2021   MICROALBUR 5.2 (H) 05/09/2020   BP Readings from Last 3 Encounters:  08/16/21 138/80  07/24/21 (!) 144/82  07/11/21 140/80   Lab review ASSESSMENT AND PLAN:  Discussed the following assessment and  plan:  Suspected UTI - Recurrent culture pending retreat.  Consider urology help - Plan: POC Urinalysis Dipstick, Urine Culture  Adjustment disorder with anxious mood  Type 2 diabetes mellitus with stage 3a chronic kidney disease, without long-term current use of insulin ([place]) - Plan: POC HgB A1c  Bereavement reaction  Essential hypertension, benign  Obesity with current BMI of 33.45  Medication management Discussed portion size ;glad she is eating healthy food Encouraged continued expectant management with grief work having to go through possessions and moving. Continue antihypertensive medications  -Patient advised to return or notify health care team  if  new concerns arise.  Patient Instructions  Retreat for uti and if sx are  persistent or progressive  and not controlled we can do a urology referral.  Let us know if antibiotic helps.  Continue grief work . Bp control. Keep behavioral health appt. A1c is ok today. Plan rov in 3 months or as needed   Standley Brooking. Kamaal Cast M.D.

## 2021-08-16 NOTE — Telephone Encounter (Signed)
Message sent to pt, med will be refilled at visit next week.

## 2021-08-16 NOTE — Patient Instructions (Signed)
Retreat for uti and if sx are  persistent or progressive  and not controlled we can do a urology referral.  Let us know if antibiotic helps.  Continue grief work . Bp control. Keep behavioral health appt. A1c is ok today. Plan rov in 3 months or as needed

## 2021-08-16 NOTE — Telephone Encounter (Signed)
Pt pharmacy  [facility] calling for refill on this pt for breztri inhaler.Samantha Hinton'

## 2021-08-18 LAB — URINE CULTURE
MICRO NUMBER:: 13583447
SPECIMEN QUALITY:: ADEQUATE

## 2021-08-18 NOTE — Progress Notes (Signed)
Ur cx shows e coli sensitive to medication given  . Take macrobid and  fu if  persistent or progressive symptoms.

## 2021-08-21 ENCOUNTER — Other Ambulatory Visit (INDEPENDENT_AMBULATORY_CARE_PROVIDER_SITE_OTHER): Payer: Self-pay

## 2021-08-21 ENCOUNTER — Telehealth (INDEPENDENT_AMBULATORY_CARE_PROVIDER_SITE_OTHER): Payer: Self-pay | Admitting: Family Medicine

## 2021-08-21 DIAGNOSIS — J3089 Other allergic rhinitis: Secondary | ICD-10-CM

## 2021-08-21 MED ORDER — LEVOCETIRIZINE DIHYDROCHLORIDE 5 MG PO TABS: 5.0000 mg | ORAL_TABLET | Freq: Every evening | ORAL | 0 refills | Status: DC

## 2021-08-21 NOTE — Telephone Encounter (Signed)
[facility] with [facility] at [phone] called stating that they did not receive the RX for pt's medication. Please call the pharmacy at [phone].  [facility].

## 2021-08-21 NOTE — Telephone Encounter (Signed)
Left message for [place]

## 2021-08-23 NOTE — Progress Notes (Deleted)
TeleHealth Visit:  This visit was completed with telemedicine (audio/video) technology. Samantha Hinton has verbally consented to this TeleHealth visit. The patient is located at home, the provider is located at home. The participants in this visit include the listed provider and patient. The visit was conducted today via MyChart video.  OBESITY Samantha Hinton is here to discuss her progress with her obesity treatment plan along with follow-up of her obesity related diagnoses.   Today's visit was # 15 Starting weight: 230 lbs Starting date: 07/14/2020 Weight at last in office visit: 201 lbs on 06/01/21 Total weight loss: 29 lbs at last in office visit on 06/01/21. Today's reported weight: *** lbs No weight reported.  Nutrition Plan: practicing portion control and making smarter food choices, such as increasing vegetables and decreasing simple carbohydrates.  Hunger is {EWCONTROLASSESSMENT:24261}. Cravings are {EWCONTROLASSESSMENT:24261}.  Current exercise: {exercise types:16438}  Interim History: ***  Assessment/Plan:  1. ***  2. ***  3. ***  Obesity: Current BMI *** Samantha Hinton {CHL AMB IS/IS NOT:210130109} currently in the action stage of change. As such, her goal is to {MWMwtloss#1:210800005}.  She has agreed to {MWMwtlossportion/plan2:23431}.   Exercise goals: {MWM EXERCISE RECS:23473}  Behavioral modification strategies: {MWMwtlossdietstrategies3:23432}.  Samantha Hinton has agreed to follow-up with our clinic in {NUMBER 1-10:22536} weeks.   No orders of the defined types were placed in this encounter.   There are no discontinued medications.   No orders of the defined types were placed in this encounter.     Objective:   VITALS: Per patient if applicable, see vitals. GENERAL: Alert and in no acute distress. CARDIOPULMONARY: No increased WOB. Speaking in clear sentences.  PSYCH: Pleasant and cooperative. Speech normal rate and rhythm. Affect is appropriate. Insight and judgement  are appropriate. Attention is focused, linear, and appropriate.  NEURO: Oriented as arrived to appointment on time with no prompting.   Lab Results  Component Value Date   CREATININE 1.16 (H) 05/11/2021   BUN 31 (H) 05/11/2021   NA 141 05/11/2021   K 4.4 05/11/2021   CL 95 (L) 05/11/2021   CO2 27 05/11/2021   Lab Results  Component Value Date   ALT 15 05/11/2021   AST 21 05/11/2021   ALKPHOS 112 05/11/2021   BILITOT 0.3 05/11/2021   Lab Results  Component Value Date   HGBA1C 5.7 (A) 08/16/2021   HGBA1C 5.8 (H) 05/11/2021   HGBA1C 6.0 (H) 11/15/2020   HGBA1C 6.2 (H) 07/14/2020   HGBA1C 6.0 05/09/2020   Lab Results  Component Value Date   INSULIN 51.4 (H) 05/11/2021   INSULIN 6.1 07/14/2020   Lab Results  Component Value Date   TSH 3.06 05/09/2020   Lab Results  Component Value Date   CHOL 184 05/11/2021   HDL 58 05/11/2021   LDLCALC 87 05/11/2021   LDLDIRECT 242.0 05/09/2020   TRIG 238 (H) 05/11/2021   CHOLHDL 3.2 05/11/2021   Lab Results  Component Value Date   WBC 6.2 05/09/2020   HGB 13.6 05/09/2020   HCT 41.2 05/09/2020   MCV 94.1 05/09/2020   PLT 195.0 05/09/2020   Lab Results  Component Value Date   IRON 112 04/10/2019   FERRITIN 160.8 04/10/2019   Lab Results  Component Value Date   VD25OH 63.4 05/11/2021   VD25OH 57.1 11/15/2020   VD25OH 24.6 (L) 07/14/2020    Attestation Statements:   Reviewed by clinician on day of visit: allergies, medications, problem list, medical history, surgical history, family history, social history, and previous encounter notes.  ***(delete  if time-based billing not used) Time spent on visit including the items listed below was *** minutes.  -preparing to see the patient (e.g., review of tests, history, previous notes) -obtaining and/or reviewing separately obtained history -counseling and educating the patient/family/caregiver -documenting clinical information in the electronic or other health  record -ordering medications, tests, or procedures -independently interpreting results and communicating results to the patient/ family/caregiver -referring and communicating with other health care professionals  -care coordination

## 2021-08-28 ENCOUNTER — Telehealth (INDEPENDENT_AMBULATORY_CARE_PROVIDER_SITE_OTHER): Payer: Medicare Other | Admitting: Family Medicine

## 2021-09-02 ENCOUNTER — Ambulatory Visit (HOSPITAL_COMMUNITY): Payer: Self-pay | Admitting: Psychiatry

## 2021-09-13 ENCOUNTER — Other Ambulatory Visit (INDEPENDENT_AMBULATORY_CARE_PROVIDER_SITE_OTHER): Payer: Self-pay | Admitting: Family Medicine

## 2021-09-13 ENCOUNTER — Other Ambulatory Visit: Payer: Self-pay | Admitting: Internal Medicine

## 2021-09-13 DIAGNOSIS — J3089 Other allergic rhinitis: Secondary | ICD-10-CM

## 2021-09-14 ENCOUNTER — Telehealth: Payer: Self-pay | Admitting: Pharmacist

## 2021-09-14 DIAGNOSIS — L03116 Cellulitis of left lower limb: Secondary | ICD-10-CM | POA: Diagnosis not present

## 2021-09-14 NOTE — Chronic Care Management (AMB) (Signed)
Chronic Care Management Pharmacy Assistant   Name: Samantha Hinton  MRN: [id number] DOB: 1955/10/27   Reason for Encounter: Medication Review Medication coordination  Recent office visits:  None   Recent consult visits:  09/14/21 [name], FNP - Patient presented for Cellulitis of left anterior lower leg. No medication changes.  Hospital visits:  None in previous 6 months  Medications: Outpatient Encounter Medications as of 09/14/2021  Medication Sig Note   albuterol (PROAIR HFA) 108 (90 Base) MCG/ACT inhaler 2 puffs every 4 hours as needed only  if your can't catch your breath    albuterol (PROVENTIL) (2.5 MG/3ML) 0.083% nebulizer solution Take 3 mLs (2.5 mg total) by nebulization every 6 (six) hours as needed for wheezing or shortness of breath.    allopurinol (ZYLOPRIM) 100 MG tablet TAKE ONE TABLET BY MOUTH TWICE DAILY    bisoprolol-hydrochlorothiazide (ZIAC) 5-6.25 MG tablet Take 1 tablet by mouth 2 (two) times daily.    blood glucose meter kit and supplies KIT Dispense based on patient and insurance preference. Use up to four times daily as directed.    Budeson-Glycopyrrol-Formoterol (BREZTRI AEROSPHERE) 160-9-4.8 MCG/ACT AERO Inhale 2 puffs into the lungs 2 (two) times daily.    cephALEXin (KEFLEX) 500 MG capsule Take 1 capsule (500 mg total) by mouth 3 (three) times daily. For uti    cloNIDine (CATAPRES) 0.1 MG tablet Take 1 tablet (0.1 mg total) by mouth 2 (two) times daily.    Colchicine 0.6 MG CAPS Take 0.6 mg by mouth 2 (two) times daily as needed. 07/14/2020: Using prn   gabapentin (NEURONTIN) 100 MG capsule Take 2 capsules (200 mg total) by mouth 3 (three) times daily.    Glucose Blood (BLOOD GLUCOSE TEST STRIPS 333) STRP 1 strip by In Vitro route in the morning and at bedtime.    Lancets MISC 1 Device by Does not apply route 2 (two) times daily.    levocetirizine (XYZAL) 5 MG tablet Take 1 tablet (5 mg total) by mouth every evening.    linaclotide (LINZESS)  145 MCG CAPS capsule Take 1 capsule (145 mcg total) by mouth daily.    montelukast (SINGULAIR) 10 MG tablet Take 1 tablet (10 mg total) by mouth at bedtime.    omeprazole (PRILOSEC) 40 MG capsule TAKE ONE TABLET BY MOUTH TWICE DAILY    ondansetron (ZOFRAN ODT) 4 MG disintegrating tablet Take 1 tablet (4 mg total) by mouth every 8 (eight) hours as needed for nausea or vomiting.    OZEMPIC, 0.25 OR 0.5 MG/DOSE, 2 MG/3ML SOPN Inject 0.5 mg into the skin once a week.    polyethylene glycol (MIRALAX / GLYCOLAX) 17 g packet Take 17 g by mouth 2 (two) times daily. Until stooling regularly    predniSONE (DELTASONE) 10 MG tablet Take  4 each am x 2 days,   2 each am x 2 days,  1 each am x 2 days and stop (Patient taking differently: Take  4 each am x 2 days,   2 each am x 2 days,  1 each am x 2 days and stop)    rosuvastatin (CRESTOR) 10 MG tablet TAKE ONE TABLET BY MOUTH EVERY EVENING    venlafaxine XR (EFFEXOR-XR) 75 MG 24 hr capsule TAKE ONE CAPSULE BY MOUTH EVERY MORNING    Vitamin D, Ergocalciferol, (DRISDOL) 1.25 MG (50000 UNIT) CAPS capsule Take 1 capsule by mouth every 7 days.    No facility-administered encounter medications on file as of 09/14/2021.  BP Readings from Last 3 Encounters:  08/16/21 138/80  07/24/21 (!) 144/82  07/11/21 140/80    Lab Results  Component Value Date   HGBA1C 5.7 (A) 08/16/2021     Patient obtains medications through Adherence Packaging  30 Days   Last adherence delivery included:  Levocetirizine 10 mg - take 1 tablet daily at dinner Omeprazole 40 mg - take 1 capsule twice daily at breakfast and dinner Clonidine 0.61m - take 1 tablet twice daily at breakfast and dinner Allopurinol 100 mg - take 1 tablet twice daily at breakfast and dinner Rosuvastatin 10 mg - take 1 tablet with dinner Bisoprolol- HCTZ 5-6.25 MG - take 1 tablet twice daily at breakfast and dinner Gabapentin 100 mg - take 2 capsule 3 times daily at breakfast Lunch and dinner   Ozempic  0.5 mg : Inject 0.5 mg once weekly Unable to reach to confirm patient needs   Test strips and Lancets due to be delivered 10/21/21   Breztri Inhaler - requested prescription be sent over from Provider   Unable to reach to confirm delivery date of 08/28/21,  left msg  advised patient that pharmacy will contact them the morning of delivery.     Patient is due for next adherence delivery on: 09/26/21. Called patient and reviewed medications and coordinated delivery. Packs 30 DS  This delivery to include: Bisoprolol- HCTZ 5-6.25 MG - take 1 tablet twice daily at breakfast and dinner Gabapentin 100 mg - take 2 capsule 3 times daily at breakfast Lunch and dinner Omeprazole 40 mg - take 1 capsule twice daily at breakfast and dinner Clonidine 0.158m- take 1 tablet twice daily at breakfast and dinner Allopurinol 100 mg - take 1 tablet twice daily at breakfast and dinner Rosuvastatin 10 mg - take 1 tablet with dinner Levocetirizine 10 mg - take 1 tablet daily at dinner  Test strips and lancets due to be sent 10/21/21   Unable to reach to confirm delivery date of 09/26/21,  left msg advised patient that pharmacy will contact them the morning of delivery.   Care Gaps: PNA Vaccine - Overdue Foot Exam - Postponed Mammogram - Postponed Pap Smear - Postponed Eye Exam - Postponed Dexa - Postponed COVID Booster - Postponed Zoster Vaccine - Postponed BP- 138/80 08/16/21 AWV- 11/22  Lab Results  Component Value Date   HGBA1C 5.7 (A) 08/16/2021    Star Rating Drugs: Rosuvastatin (Crestor) 10 mg - Last filled 08/21/21 30 DS at [facility] Semaglutide (Ozempic) - Patient declined last delivery 06/2021     [facility] Assistant [phone]

## 2021-09-18 ENCOUNTER — Other Ambulatory Visit: Payer: Self-pay | Admitting: Family Medicine

## 2021-09-22 ENCOUNTER — Other Ambulatory Visit (INDEPENDENT_AMBULATORY_CARE_PROVIDER_SITE_OTHER): Payer: Self-pay | Admitting: Family Medicine

## 2021-09-22 DIAGNOSIS — J3089 Other allergic rhinitis: Secondary | ICD-10-CM

## 2021-09-27 ENCOUNTER — Encounter (INDEPENDENT_AMBULATORY_CARE_PROVIDER_SITE_OTHER): Payer: Self-pay

## 2021-10-07 ENCOUNTER — Ambulatory Visit (HOSPITAL_COMMUNITY): Payer: Self-pay | Admitting: Psychiatry

## 2021-10-11 ENCOUNTER — Ambulatory Visit (HOSPITAL_COMMUNITY): Payer: Medicare Other | Admitting: Psychiatry

## 2021-10-11 ENCOUNTER — Encounter (HOSPITAL_COMMUNITY): Payer: Self-pay | Admitting: Psychiatry

## 2021-10-11 DIAGNOSIS — F331 Major depressive disorder, recurrent, moderate: Secondary | ICD-10-CM | POA: Diagnosis not present

## 2021-10-11 DIAGNOSIS — F4321 Adjustment disorder with depressed mood: Secondary | ICD-10-CM | POA: Insufficient documentation

## 2021-10-11 DIAGNOSIS — F339 Major depressive disorder, recurrent, unspecified: Secondary | ICD-10-CM | POA: Insufficient documentation

## 2021-10-11 MED ORDER — DOXEPIN HCL 25 MG PO CAPS: 25.0000 mg | ORAL_CAPSULE | Freq: Every evening | ORAL | 0 refills | Status: DC

## 2021-10-11 MED ORDER — DULOXETINE HCL 30 MG PO CPEP: ORAL_CAPSULE | ORAL | 2 refills | Status: DC

## 2021-10-11 NOTE — Patient Instructions (Addendum)
Take doxepin 25 mg every night about 30-60 minutes before going to sleep.  Take Cymbalta 30 mg twice a day (morning and night) for one week and then go up to 60 mg twice a day (morning and night)  Referral for a therapist  Work to cut down alcohol use.  Follow up in 3 weeks

## 2021-10-11 NOTE — Progress Notes (Signed)
Psychiatric Initial Adult Assessment   Patient Identification: Samantha Hinton MRN:  [id number] Date of Evaluation:  10/11/2021 Referral Source: Self Chief Complaint:   Chief Complaint  Patient presents with   Depression   Visit Diagnosis: MDD with grief reaction  History of Present Illness: Patient presents reporting that she has been struggling with increased depression since she lost her partner of 14 years after he passed away in July 28, 2022. She was the one to find him and this is the second partner that has passed away with her finding them. Following his passing Vianka had to sell the townhouse and get rid of a lot of her things. She moved in with her son for 3 months before moving to the apartment where she is living now. The loss of her current partner brought back memories of her former marriage where her husband died next to her in bed. At that time patient had a similar response which resulted in her turning to alcohol to numb the feelings. She had started to do the same this time as well and that was when her son intervened and told her that she needed to seek help. Patient reports that she still has a couple beers a night, but has cut back considerably and is open to discontinuing or switching to a non alcoholic option. We discussed the alcohol and she reports that is can slow down her racing thoughts, but makes the depressed thoughts more prevalent at the same time.   Currently patient endorses feeling  melancholic, hopeless, not interested in anything, with no motivation, poor sleep, racing thoughts, and finds herself just sitting and staring around most of the day. She denies any SI or thoughts of self harm, but does not that she had on overdose attempt in the 90's which resulted in her being hospitalized on an inpatient psychiatric unit.   Patient is interested in starting medications and notes that she had been on Effexor, Zoloft, trazodone, and Cymbalta in the past. Most recently she  was on Effexor 2 months ago which she suddenly as she had felt that it was not working. Patient had felt that Cymbalta was helpful around 16 years ago when she took it last.   Associated Signs/Symptoms: Depression Symptoms:  insomnia, feelings of worthlessness/guilt, difficulty concentrating, hopelessness, loss of energy/fatigue, disturbed sleep, weight gain, increased appetite, (Hypo) Manic Symptoms:   denies Anxiety Symptoms:  Excessive Worry, Psychotic Symptoms:   denies PTSD Symptoms: Had a traumatic exposure:  in childhood  Past Psychiatric History: Patient reports an extensive past psychiatric history starting from a traumatic upbringing. She was started on antidepressant medications following a suicide attempt in the 90's, at which time she hospitalized on an inpatient psychiatric unit. Patient has tried Zoloft (negative side effects), Effexor (ineffective), Cymbalta, Wellbutrin, gabapentin, trazodone, and clonidine in the past.   Previous Psychotropic Medications: Yes   Substance Abuse History in the last 12 months:  Yes.   Alcohol use, a couple drinks a night or before social situations. Since the passing of her partner. Denies any other substance use.  Consequences of Substance Abuse: Negative  Past Medical History:  Past Medical History:  Diagnosis Date   Anxiety    Asthma    Chronic back pain    buldging disc and tumor.Spondylolisthesis   COPD (chronic obstructive pulmonary disease) (HCC)    Symbicort daily and ProAir as needed   Depression    Emphysema    Emphysema of lung ([place])    Fatty liver 04/13/2019  Fatty liver    Fibromyalgia    Gallbladder problem    Genital warts    GERD (gastroesophageal reflux disease)    takes Omeprazole daily   Heartburn    History of bronchitis 01/2014   History of colon polyps    Hyperlipidemia    has been off of meds d/t getting sick from them.Not been addressed again.    Hypertension    takes Ziac daily   Nocturia     Pneumonia    hx of-8+yrs ago   PONV (postoperative nausea and vomiting)    Shortness of breath dyspnea    thinks d/t pain meds and notices with exertion   SOBOE (shortness of breath on exertion)    Stroke ([place])    16 yrs ago--left eye    Swallowing difficulty     Past Surgical History:  Procedure Laterality Date   ABDOMINAL HYSTERECTOMY  1983   partial    ADENOIDECTOMY     ANTERIOR (CYSTOCELE) AND POSTERIOR REPAIR (RECTOCELE) WITH XENFORM GRAFT AND SACROSPINOUS FIXATION     APPENDECTOMY  1967   BACK SURGERY  2017   lower back   BREAST SURGERY Right    diseased milk glands   CHOLECYSTECTOMY  1983   COLONOSCOPY  04/13/2019   DIAGNOSTIC LAPAROSCOPY     cyst removed from ovaries    ESOPHAGOGASTRODUODENOSCOPY     lypoma  2011   NECK SURGERY  [phone]   c spine ant and post hx fusion and removal or harcdware and shavings   POLYPECTOMY     rods and screws in lumbar     TONSILLECTOMY     UPPER GASTROINTESTINAL ENDOSCOPY  04/13/2019    Family Psychiatric History: Denies  Family History:  Family History  Problem Relation Age of Onset   CAD Father        tumor between heart and lung    Hypertension Father    Cancer Father    CAD Sister        ? dx    Hypertension Mother    Lung cancer Mother        died 72    Diabetes Maternal Grandmother        late 23s    Colon cancer Neg Hx    Colon polyps Neg Hx    Esophageal cancer Neg Hx    Rectal cancer Neg Hx    Stomach cancer Neg Hx     Social History:   Social History   Socioeconomic History   Marital status: Significant Other    Spouse name: [name]   Number of children: 2   Years of education: Not on file   Highest education level: Not on file  Occupational History   Occupation: retired    Fish farm manager: [name]  Tobacco Use   Smoking status: Former    Packs/day: 0.25    Years: 44.00    Total pack years: 11.00    Types: Cigarettes    Quit date: 02/04/2014    Years since quitting: 7.6    Smokeless tobacco: Never   Tobacco comments:    quit smoking in Dec 2015  Vaping Use   Vaping Use: Former  Substance and Sexual Activity   Alcohol use: Yes    Alcohol/week: 4.0 standard drinks of alcohol    Types: 4 Standard drinks or equivalent per week    Comment: couple times a week   Drug use: No   Sexual activity: Not Currently    Birth control/protection:  Surgical    Comment: 1st intercourse 66 yo-Fewer than 5 partners  Other Topics Concern   Not on file  Social History Narrative   6-8 hours of sleep per night   Lives with her fiance hh of 2 no pets    1 dog in the home   Retired no ets  But tob 8 per day   etoh ocass 1-2    On disability from her neck surgery predicaments .   orig from [employer] in [place] area.    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 2   Last Pap 2006 last mammogram 2000 and   Has dentures   FA    Social Determinants of Health   Financial Resource Strain: Medium Risk (12/21/2020)   Overall Financial Resource Strain (CARDIA)    Difficulty of Paying Living Expenses: Somewhat hard  Food Insecurity: No Food Insecurity (12/21/2020)   Hunger Vital Sign    Worried About Running Out of Food in the Last Year: Never true    Ran Out of Food in the Last Year: Never true  Transportation Needs: No Transportation Needs (12/21/2020)   PRAPARE - Hydrologist (Medical): No    Lack of Transportation (Non-Medical): No  Physical Activity: Inactive (12/21/2020)   Exercise Vital Sign    Days of Exercise per Week: 0 days    Minutes of Exercise per Session: 0 min  Stress: No Stress Concern Present (12/21/2020)   [place]    Feeling of Stress : Only a little  Social Connections: Socially Isolated (12/21/2020)   Social Connection and Isolation Panel [NHANES]    Frequency of Communication with Friends and Family: Twice a week    Frequency of Social  Gatherings with Friends and Family: Three times a week    Attends Religious Services: Never    Active Member of Clubs or Organizations: No    Attends Archivist Meetings: Never    Marital Status: Divorced    Additional Social History: Patient moved to [place] from [place] around 14 years ago. She has a son and daughter in law in the area in addition to grandkids here and back up [place]. She currently lives alone in an apartment following the passing of her former partner in May of 2023.   Allergies:   Allergies  Allergen Reactions   Wellbutrin [Bupropion] Nausea And Vomiting    Metabolic Disorder Labs: Lab Results  Component Value Date   HGBA1C 5.7 (A) 08/16/2021   No results found for: "PROLACTIN" Lab Results  Component Value Date   CHOL 184 05/11/2021   TRIG 238 (H) 05/11/2021   HDL 58 05/11/2021   CHOLHDL 3.2 05/11/2021   VLDL 51.0 (H) 05/09/2020   LDLCALC 87 05/11/2021   LDLCALC 48 11/15/2020   Lab Results  Component Value Date   TSH 3.06 05/09/2020    Therapeutic Level Labs: No results found for: "LITHIUM" No results found for: "CBMZ" No results found for: "VALPROATE"  Current Medications: Current Outpatient Medications  Medication Sig Dispense Refill   albuterol (PROAIR HFA) 108 (90 Base) MCG/ACT inhaler 2 puffs every 4 hours as needed only  if your can't catch your breath 18 g 11   allopurinol (ZYLOPRIM) 100 MG tablet TAKE ONE TABLET BY MOUTH TWICE DAILY 60 tablet 1   bisoprolol-hydrochlorothiazide (ZIAC) 5-6.25 MG tablet Take 1 tablet by mouth 2 (two) times daily. 180 tablet 2  blood glucose meter kit and supplies KIT Dispense based on patient and insurance preference. Use up to four times daily as directed. 1 each 0   Budeson-Glycopyrrol-Formoterol (BREZTRI AEROSPHERE) 160-9-4.8 MCG/ACT AERO Inhale 2 puffs into the lungs 2 (two) times daily. 10.7 g 11   cloNIDine (CATAPRES) 0.1 MG tablet Take 1 tablet (0.1 mg total) by mouth 2 (two) times daily.  180 tablet 1   Colchicine 0.6 MG CAPS Take 0.6 mg by mouth 2 (two) times daily as needed. 60 capsule 1   gabapentin (NEURONTIN) 100 MG capsule TAKE TWO CAPSULES BY MOUTH three times daily 180 capsule 3   Glucose Blood (BLOOD GLUCOSE TEST STRIPS 333) STRP 1 strip by In Vitro route in the morning and at bedtime. 200 strip 1   Lancets MISC 1 Device by Does not apply route 2 (two) times daily. 200 each 1   omeprazole (PRILOSEC) 40 MG capsule TAKE ONE TABLET BY MOUTH TWICE DAILY 60 capsule 0   rosuvastatin (CRESTOR) 10 MG tablet TAKE ONE TABLET BY MOUTH EVERY EVENING 90 tablet 1   No current facility-administered medications for this visit.    Musculoskeletal: Strength & Muscle Tone: within normal limits Gait & Station: normal Patient leans: Front  Psychiatric Specialty Exam: Review of Systems  There were no vitals taken for this visit.There is no height or weight on file to calculate BMI.  General Appearance: Well Groomed  Eye Contact:  Fair  Speech:  Clear and Coherent  Volume:  Normal  Mood:  Depressed and Worthless  Affect:  Congruent and Depressed  Thought Process:  Coherent and Linear  Orientation:  Full (Time, Place, and Person)  Thought Content:  Logical and Rumination  Suicidal Thoughts:  No  Homicidal Thoughts:  No  Memory:  NA  Judgement:  Intact  Insight:  Present  Psychomotor Activity:  Normal  Concentration:  Concentration: Fair and Attention Span: Good  Recall:  [employer] of Knowledge:Good  Language: Good  Akathisia:  NA    AIMS (if indicated):  not done  Assets:  Social Support  ADL's:  Intact  Cognition: WNL  Sleep:  Fair   Screenings: PHQ2-9    [place] Office Visit from 10/11/2021 in [facility] Office Visit from 08/16/2021 in [place] at [place] from 07/11/2021 in [place] at [place] from 12/21/2020 in [place] at [place] from  07/14/2020 in [place]  PHQ-2 Total Score 5 4 6 1 5   PHQ-9 Total Score 19 13 20  -- 16       Assessment and Plan: Patient is a 66 year old single female with a past psychiatric history of MDD, past trauma, and one prior psychiatric hospitalization in the 1990's following a medication overdose. She self referred to [facility] behavioral health for derepressed mood and increased alcohol use in the context of the passing of her partner of 14 years. Patient currently has no other psychiatric providers and was on Effexor managed by her PCP which she self discontinued in June of 2023.  Patient presents today endorsing neurovegetative hopelessness, anhedonia, amotivation, insomnia, guilt, insomnia, increased appetite, racing thoughts, and grief response. She denies any SI or thoughts of self harm at this time. Patient has increased her alcohol use to  a couple beers a night as a coping mechanism. She was cautioned on the dangers of alcohol use in depression and encouraged to taper off her alcohol use. Patient has protective factors in  her financial stability, and social support from her son and grandkids. At this time patient seems to be experiencing a grief response following the loss of her partner of 14 years, however she also likely has underlying MDD based on her extensive history of similar depressive symptoms and past suicide attempt. She would benefit from medication management and connecting with ongoing therapy.   Plan: - Patient self discontinued Effexor 2 months ago, cautioned on risk of discontinuing medications without contacting her provider - Start Cymbalta 30 mg BID and increase to 60 mg BID in a week - Start Doxepin 25 mg QHS for insomnia - Continue clonidine 0.1 BID for BP management - Continue gabapentin 200 mg TID for pain (patient planning to taper off) - Therapy Referral - Follow up in 3 weeks   Patient/Guardian was advised Release of Information must be  obtained prior to any record release in order to collaborate their care with an outside provider. Patient/Guardian was advised if they have not already done so to contact the registration department to sign all necessary forms in order for [place] to release information regarding their care.   Consent: Patient/Guardian gives verbal consent for treatment and assignment of benefits for services provided during this visit. Patient/Guardian expressed understanding and agreed to proceed.   [name], MD 8/23/20239:07 AM

## 2021-10-12 ENCOUNTER — Other Ambulatory Visit (INDEPENDENT_AMBULATORY_CARE_PROVIDER_SITE_OTHER): Payer: Self-pay | Admitting: Family Medicine

## 2021-10-12 ENCOUNTER — Other Ambulatory Visit: Payer: Self-pay | Admitting: Internal Medicine

## 2021-10-12 DIAGNOSIS — J3089 Other allergic rhinitis: Secondary | ICD-10-CM

## 2021-10-13 ENCOUNTER — Telehealth: Payer: Self-pay | Admitting: Internal Medicine

## 2021-10-13 MED ORDER — BREZTRI AEROSPHERE 160-9-4.8 MCG/ACT IN AERO: 2.0000 | INHALATION_SPRAY | Freq: Two times a day (BID) | RESPIRATORY_TRACT | 5 refills | Status: DC

## 2021-10-13 NOTE — Telephone Encounter (Signed)
Samantha Hinton has been sent to preferred pharmacy.  Left detailed message for patient.  Will close encounter, as nothing further is needed.

## 2021-10-16 ENCOUNTER — Telehealth: Payer: Self-pay | Admitting: Pharmacist

## 2021-10-16 NOTE — Chronic Care Management (AMB) (Unsigned)
Chronic Care Management Pharmacy Assistant   Name: Samantha Hinton  MRN: [id number] DOB: 1955/04/19  Reason for Encounter: Medication Review Medication Coordination    Recent office visits:  None  Recent consult visits:  10/11/21 Samantha Mink, MD [facility]) - Patient presented for Moderate episode of recurrent major depressive disorder. No other visit details available.  09/14/21 Samantha Carina, FNP (Vascular) - Patient presented for Cellulitis of left anterior lower leg. Prescribed Cephalexin 500 mg.   Hospital visits:  None in previous 6 months  Medications: Outpatient Encounter Medications as of 10/16/2021  Medication Sig Note   albuterol (PROAIR HFA) 108 (90 Base) MCG/ACT inhaler 2 puffs every 4 hours as needed only  if your can't catch your breath    allopurinol (ZYLOPRIM) 100 MG tablet TAKE ONE TABLET BY MOUTH TWICE DAILY    bisoprolol-hydrochlorothiazide (ZIAC) 5-6.25 MG tablet Take 1 tablet by mouth 2 (two) times daily.    blood glucose meter kit and supplies KIT Dispense based on patient and insurance preference. Use up to four times daily as directed.    Budeson-Glycopyrrol-Formoterol (BREZTRI AEROSPHERE) 160-9-4.8 MCG/ACT AERO Inhale 2 puffs into the lungs 2 (two) times daily.    cloNIDine (CATAPRES) 0.1 MG tablet Take 1 tablet (0.1 mg total) by mouth 2 (two) times daily.    Colchicine 0.6 MG CAPS Take 0.6 mg by mouth 2 (two) times daily as needed. 07/14/2020: Using prn   doxepin (SINEQUAN) 25 MG capsule Take 1 capsule (25 mg total) by mouth every evening.    DULoxetine (CYMBALTA) 30 MG capsule Take 1 capsule (30 mg total) by mouth 2 (two) times daily for 7 days, THEN 2 capsules (60 mg total) 2 (two) times daily for 21 days.    gabapentin (NEURONTIN) 100 MG capsule TAKE TWO CAPSULES BY MOUTH three times daily    Glucose Blood (BLOOD GLUCOSE TEST STRIPS 333) STRP 1 strip by In Vitro route in the morning and at bedtime.    Lancets MISC 1 Device by Does not  apply route 2 (two) times daily.    omeprazole (PRILOSEC) 40 MG capsule TAKE ONE CAPSULE BY MOUTH TWICE DAILY Please schedule office visit for further refills    rosuvastatin (CRESTOR) 10 MG tablet TAKE ONE TABLET BY MOUTH EVERY EVENING    No facility-administered encounter medications on file as of 10/16/2021.   Reviewed chart for medication changes ahead of medication coordination call.   BP Readings from Last 3 Encounters:  08/16/21 138/80  07/24/21 (!) 144/82  07/11/21 140/80    Lab Results  Component Value Date   HGBA1C 5.7 (A) 08/16/2021     Patient obtains medications through Adherence Packaging  30 Days   Last adherence delivery included: Bisoprolol- HCTZ 5-6.25 MG - take 1 tablet twice daily at breakfast and dinner Gabapentin 100 mg - take 2 capsule 3 times daily at breakfast Lunch and dinner Omeprazole 40 mg - take 1 capsule twice daily at breakfast and dinner Clonidine 0.33m - take 1 tablet twice daily at breakfast and dinner Allopurinol 100 mg - take 1 tablet twice daily at breakfast and dinner Rosuvastatin 10 mg - take 1 tablet with dinner Levocetirizine 10 mg - take 1 tablet daily at dinner   Test strips and lancets due to be sent 10/21/21     Unable to reach to confirm delivery date of 09/26/21,  left msg advised patient that pharmacy will contact them the morning of delivery.    Patient is due  for next adherence delivery on: 10/25/21. Called patient and reviewed medications and coordinated delivery. Packs 30 DS  This delivery to include: Bisoprolol- HCTZ 5-6.25 MG - take 1 tablet twice daily at breakfast and dinner Gabapentin 100 mg - take 2 capsule 3 times daily at breakfast Lunch and dinner Omeprazole 40 mg - take 1 capsule twice daily at breakfast and dinner Clonidine 0.52m - take 1 tablet twice daily at breakfast and dinner Allopurinol 100 mg - take 1 tablet twice daily at breakfast and dinner Rosuvastatin 10 mg - take 1 tablet with dinner Levocetirizine 10  mg - take 1 tablet daily at dinner One touch delica Lancets One touch ultra test strips   Received voicemail from patient reporting she no longer wishes to be part of this service and she has moved and would like her medications transferred [facility] [address] [facility]/MP and [facility] advised  Care Gaps: Pna Vaccine - Overdue Flu Vaccine - Overdue Foot Exam - Postponed Mammogram - Postponed Pap Smear - Postponed Eye Exam - Postponed DEXA - Postponed COVID Booster - Postponed Zoster Vaccine - Postponed CCM- Need BP- 138/80 08/16/21 AWV- 11/22 Lab Results  Component Value Date   HGBA1C 5.7 (A) 08/16/2021    Star Rating Drugs: Rosuvastatin (Crestor) 10 mg - Last filled 08/21/21 30 DS [facility] [phone]

## 2021-10-24 NOTE — Progress Notes (Unsigned)
Samantha Hinton [address] [place] Phone: [phone] Subjective:   Samantha Hinton, am serving as a scribe for Dr. Hulan Saas.  I'm seeing this patient by the request  of:  Panosh, Standley Brooking, MD  CC: low back pain follow up   TMA:[id number]  07/24/2021 Patient has been doing very well with the Effexor.  Recently though did have the passing of her partner.  Unfortunately secondary to that she has not been able to do as much of the exercises.  Has decreased some of her gabapentin as well.  Follow-up with me again in 6 to 8 weeks total time reviewing patient's chart, discussing with patient about life and other treatment options 31 minutes  Update 10/30/2021 Samantha Hinton is a 66 y.o. female coming in with complaint of lumbar spine pain. Patient states doing well. Doesn't have th constant ache anymore. Her medications are switching up. She has stopped the Effexor, weaning off the gabapentin and starting Cymbalta. She is currently seeing a psychologist and a psychiatrist.       Past Medical History:  Diagnosis Date   Anxiety    Asthma    Chronic back pain    buldging disc and tumor.Spondylolisthesis   COPD (chronic obstructive pulmonary disease) (HCC)    Symbicort daily and ProAir as needed   Depression    Emphysema    Emphysema of lung ([place])    Fatty liver 04/13/2019   Fatty liver    Fibromyalgia    Gallbladder problem    Genital warts    GERD (gastroesophageal reflux disease)    takes Omeprazole daily   Heartburn    History of bronchitis 01/2014   History of colon polyps    Hyperlipidemia    has been off of meds d/t getting sick from them.Not been addressed again.    Hypertension    takes Ziac daily   Nocturia    Pneumonia    hx of-8+yrs ago   PONV (postoperative nausea and vomiting)    Shortness of breath dyspnea    thinks d/t pain meds and notices with exertion   SOBOE (shortness of breath on exertion)    Stroke  ([place])    16 yrs ago--left eye    Swallowing difficulty    Past Surgical History:  Procedure Laterality Date   ABDOMINAL HYSTERECTOMY  1983   partial    ADENOIDECTOMY     ANTERIOR (CYSTOCELE) AND POSTERIOR REPAIR (RECTOCELE) WITH XENFORM GRAFT AND SACROSPINOUS FIXATION     APPENDECTOMY  1967   BACK SURGERY  2017   lower back   BREAST SURGERY Right    diseased milk glands   CHOLECYSTECTOMY  1983   COLONOSCOPY  04/13/2019   DIAGNOSTIC LAPAROSCOPY     cyst removed from ovaries    ESOPHAGOGASTRODUODENOSCOPY     lypoma  2011   NECK SURGERY  [phone]   c spine ant and post hx fusion and removal or harcdware and shavings   POLYPECTOMY     rods and screws in lumbar     TONSILLECTOMY     UPPER GASTROINTESTINAL ENDOSCOPY  04/13/2019   Social History   Socioeconomic History   Marital status: Significant Other    Spouse name: Samantha Hinton   Number of children: 2   Years of education: Not on file   Highest education level: Not on file  Occupational History   Occupation: retired    Fish farm manager: [employer]  Smoking status: Former    Packs/day: 0.25    Years: 44.00    Total pack years: 11.00    Types: Cigarettes    Quit date: 02/04/2014    Years since quitting: 7.7   Smokeless tobacco: Never   Tobacco comments:    quit smoking in Dec 2015  Vaping Use   Vaping Use: Former  Substance and Sexual Activity   Alcohol use: Yes    Alcohol/week: 4.0 standard drinks of alcohol    Types: 4 Standard drinks or equivalent per week    Comment: couple times a week   Drug use: No   Sexual activity: Not Currently    Birth control/protection: Surgical    Comment: 1st intercourse 66 yo-Fewer than 5 partners  Other Topics Concern   Not on file  Social History Narrative   6-8 hours of sleep per night   Lives with her fiance hh of 2 no pets    1 dog in the home   Retired no ets  But tob 8 per day   etoh ocass 1-2    On disability from her neck surgery predicaments .    orig from [employer] in [place].    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 2   Last Pap 2006 last mammogram 2000 and   Has dentures   FA    Social Determinants of Health   Financial Resource Strain: Medium Risk (12/21/2020)   Overall Financial Resource Strain (CARDIA)    Difficulty of Paying Living Expenses: Somewhat hard  Food Insecurity: No Food Insecurity (12/21/2020)   Hunger Vital Sign    Worried About Running Out of Food in the Last Year: Never true    Ran Out of Food in the Last Year: Never true  Transportation Needs: No Transportation Needs (12/21/2020)   PRAPARE - Hydrologist (Medical): No    Lack of Transportation (Non-Medical): No  Physical Activity: Inactive (12/21/2020)   Exercise Vital Sign    Days of Exercise per Week: 0 days    Minutes of Exercise per Session: 0 min  Stress: No Stress Concern Present (12/21/2020)   [place]    Feeling of Stress : Only a little  Social Connections: Socially Isolated (12/21/2020)   Social Connection and Isolation Panel [NHANES]    Frequency of Communication with Friends and Family: Twice a week    Frequency of Social Gatherings with Friends and Family: Three times a week    Attends Religious Services: Never    Active Member of Clubs or Organizations: No    Attends Music therapist: Never    Marital Status: Divorced   Allergies  Allergen Reactions   Wellbutrin [Bupropion] Nausea And Vomiting   Family History  Problem Relation Age of Onset   CAD Father        tumor between heart and lung    Hypertension Father    Cancer Father    CAD Sister        ? dx    Hypertension Mother    Lung cancer Mother        died 30    Diabetes Maternal Grandmother        late 53s    Colon cancer Neg Hx    Colon polyps Neg Hx    Esophageal cancer Neg Hx    Rectal cancer Neg Hx    Stomach  cancer Neg Hx       Current Outpatient Medications (Cardiovascular):    bisoprolol-hydrochlorothiazide (ZIAC) 5-6.25 MG tablet, TAKE ONE TABLET BY MOUTH TWICE DAILY   cloNIDine (CATAPRES) 0.1 MG tablet, Take 1 tablet (0.1 mg total) by mouth 2 (two) times daily.   rosuvastatin (CRESTOR) 10 MG tablet, TAKE ONE TABLET BY MOUTH EVERY EVENING  Current Outpatient Medications (Respiratory):    albuterol (PROAIR HFA) 108 (90 Base) MCG/ACT inhaler, 2 puffs every 4 hours as needed only  if your can't catch your breath   Budeson-Glycopyrrol-Formoterol (BREZTRI AEROSPHERE) 160-9-4.8 MCG/ACT AERO, Inhale 2 puffs into the lungs 2 (two) times daily.  Current Outpatient Medications (Analgesics):    allopurinol (ZYLOPRIM) 100 MG tablet, TAKE ONE TABLET BY MOUTH TWICE DAILY   Colchicine 0.6 MG CAPS, Take 0.6 mg by mouth 2 (two) times daily as needed.   Current Outpatient Medications (Other):    blood glucose meter kit and supplies KIT, Dispense based on patient and insurance preference. Use up to four times daily as directed.   doxepin (SINEQUAN) 25 MG capsule, Take 1 capsule (25 mg total) by mouth every evening.   DULoxetine (CYMBALTA) 30 MG capsule, Take 1 capsule (30 mg total) by mouth 2 (two) times daily for 7 days, THEN 2 capsules (60 mg total) 2 (two) times daily for 21 days.   gabapentin (NEURONTIN) 100 MG capsule, TAKE TWO CAPSULES BY MOUTH three times daily   Glucose Blood (BLOOD GLUCOSE TEST STRIPS 333) STRP, 1 strip by In Vitro route in the morning and at bedtime.   Lancets MISC, 1 Device by Does not apply route 2 (two) times daily.   omeprazole (PRILOSEC) 40 MG capsule, TAKE ONE CAPSULE BY MOUTH TWICE DAILY Please schedule office visit for further refills   Reviewed prior external information including notes and imaging from  primary care provider As well as notes that were available from care everywhere and other healthcare systems.  Patient is being followed by chronic care management.  I do see notes from  behavioral health  Past medical history, social, surgical and family history all reviewed in electronic medical record.  No pertanent information unless stated regarding to the chief complaint.   Review of Systems:  No headache, visual changes, nausea, vomiting, diarrhea, constipation, dizziness, abdominal pain, skin rash, fevers, chills, night sweats, weight loss, swollen lymph nodes, body aches, joint swelling, chest pain, shortness of breath, mood changes. POSITIVE muscle aches  Objective  Blood pressure (!) 152/86, pulse 92, height 5' 5"  (1.651 m), weight 207 lb (93.9 kg), SpO2 97 %.   General: No apparent distress alert and oriented x3 mood and affect normal, dressed appropriately.  HEENT: Pupils equal, extraocular movements intact  Respiratory: Patient's speak in full sentences and does not appear short of breath  Cardiovascular: No lower extremity edema, non tender, no erythema  No low back exam does have some loss of lordosis.  Some tenderness to palpation in the paraspinal musculature but minor.  The patient has tightness with straight leg test but no radicular symptoms    Impression and Recommendations:     The above documentation has been reviewed and is accurate and complete Lyndal Pulley, DO

## 2021-10-30 ENCOUNTER — Ambulatory Visit: Payer: Medicare Other | Admitting: Family Medicine

## 2021-10-30 DIAGNOSIS — M4316 Spondylolisthesis, lumbar region: Secondary | ICD-10-CM

## 2021-10-30 NOTE — Assessment & Plan Note (Signed)
Patient seems to be stable at this time.  Has not change medications of Cymbalta and is noted some improvement.  Patient is working on her psychology at the moment and is in a better living circumstance that she thinks is beneficial.  Does still have some radicular symptoms but is trying to titrate off of the gabapentinWe will monitor for worsening reflux sympathetic dystrophy.  Follow-up with me again in 3 months.

## 2021-10-30 NOTE — Patient Instructions (Addendum)
Glad you're in a really good place compared to where you were before No changes today

## 2021-11-02 ENCOUNTER — Telehealth (HOSPITAL_BASED_OUTPATIENT_CLINIC_OR_DEPARTMENT_OTHER): Payer: Medicare Other | Admitting: Psychiatry

## 2021-11-02 ENCOUNTER — Telehealth (HOSPITAL_COMMUNITY): Payer: Medicare Other | Admitting: Psychiatry

## 2021-11-02 ENCOUNTER — Encounter (HOSPITAL_COMMUNITY): Payer: Self-pay | Admitting: Psychiatry

## 2021-11-02 DIAGNOSIS — F4321 Adjustment disorder with depressed mood: Secondary | ICD-10-CM | POA: Diagnosis not present

## 2021-11-02 DIAGNOSIS — F331 Major depressive disorder, recurrent, moderate: Secondary | ICD-10-CM

## 2021-11-02 MED ORDER — DOXEPIN HCL 25 MG PO CAPS: 25.0000 mg | ORAL_CAPSULE | Freq: Every evening | ORAL | 0 refills | Status: DC

## 2021-11-02 MED ORDER — DULOXETINE HCL 60 MG PO CPEP: 60.0000 mg | ORAL_CAPSULE | Freq: Every day | ORAL | 1 refills | Status: DC

## 2021-11-02 NOTE — Progress Notes (Signed)
[place] MD/PA/NP OP Progress Note  11/02/2021 1:06 PM Samantha Hinton  MRN:  [id number]  Visit Diagnosis:    ICD-10-CM   1. Moderate episode of recurrent major depressive disorder (HCC)  F33.1     2. Grief reaction  F43.21       Assessment:  Patient is a 66 year old single female with a past psychiatric history of MDD, past trauma, and one prior psychiatric hospitalization in the 1990's following a medication overdose. She presented to [facility] behavioral health on 10/11/21 for initial evaluation of derepressed mood and increased alcohol use in the context of the passing of her partner of 14 years.   Patient endorsed neurovegetative symptoms of depression including hopelessness, anhedonia, amotivation, insomnia, guilt, increased appetite, racing thoughts, and grief response during initial evaluation. Patient had increased her alcohol use to  a couple beers a night as a coping mechanism, was cautioned on the dangers of alcohol use in depression, and encouraged to taper off her alcohol use. Patient has protective factors in her financial stability, and social support from her son and grandkids.   Patient met criteria for grief response and MDD at time of her initial evaluation.    Samantha Hinton presents for follow-up evaluation. Today, 11/02/21, patient reports an improvement in her mood over the last 3 weeks.  She reports that she is more upbeat, future oriented, has increased energy, and has been socializing with her family more.  She does still struggle with insomnia despite working on sleep hygiene techniques.  Patient reports that she is taking Cymbalta 60 twice a day now and has not had any adverse side effects.  She has also been taking doxepin with no adverse side effects but is unsure of the benefit.  Patient is weaning off gabapentin which is managed by Dr. Tamala Julian.  Patient did not get her thyroid checked and plans to do so prior to her next appointment.  She notes that she has an upcoming  therapy appointment with Juliann Mule next month.  We will continue patient on her current medication regimen and reevaluate the doxepin and sleep in a month.  Plan: - Continue Cymbalta 60 mg BID in a week - Continue Doxepin 25 mg QHS for insomnia - Continue clonidine 0.1 BID for BP management - Patient weaning off gabapentin now at 200 mg BID for pain (managed by Dr. Tamala Julian) - TSH ordered on 10/11/21  - Therapy Referral on 10/11/21 appointment scheduled for next month - Follow up in 4 weeks    Chief Complaint:  Chief Complaint  Patient presents with   Depression   Follow-up   HPI: Patient presents reporting that she feels other than the fact that she has the flu.  She has had significant improvement over the last 3 weeks.  She notes that she started the medication and increase his Cymbalta after 1 week up to 60 twice a day and feels like it has been helpful in both her mood and pain symptoms.  She notes that her neck/back pain have been improving despite the fact that she has been tapering off gabapentin.  She also notes that her depression seems to be less severe now.  Samantha Hinton is more future oriented and has been working to get her new apartment set up.  She has also been socializing more with her family and spending time with her grandkids going to cookouts and to her grandson children's sports games.  Samantha Hinton is also excited by the possibility of moving in a  year to live in a house on the same property as her son and grandchildren.  She notes that her son is moving right now and had mentioned to her that he thinks that she could do that in a year or so.  Patient notes that the thoughts of her partner are still present however they tend to be more upbeat than before.  She will often think of the good times that they had together.  Her sleep is still fragmented and poor despite working on sleep hygiene.  She notes that she does not take her phone to bed and stops watching TV an hour  before going to sleep.  She also notes having decreased her alcohol use.  We discussed options and patient is interested in continuing the doxepin for another month to see if there is any improvement in sleep.   Past Psychiatric History: Patient reports an extensive past psychiatric history starting from a traumatic upbringing. She was started on antidepressant medications following a suicide attempt in the 90's, at which time she hospitalized on an inpatient psychiatric unit. Patient has tried Zoloft (negative side effects), Effexor (ineffective), Cymbalta, Wellbutrin, gabapentin, trazodone, and clonidine in the past.   Past Medical History:  Past Medical History:  Diagnosis Date   Anxiety    Asthma    Chronic back pain    buldging disc and tumor.Spondylolisthesis   COPD (chronic obstructive pulmonary disease) (HCC)    Symbicort daily and ProAir as needed   Depression    Emphysema    Emphysema of lung ([place])    Fatty liver 04/13/2019   Fatty liver    Fibromyalgia    Gallbladder problem    Genital warts    GERD (gastroesophageal reflux disease)    takes Omeprazole daily   Heartburn    History of bronchitis 01/2014   History of colon polyps    Hyperlipidemia    has been off of meds d/t getting sick from them.Not been addressed again.    Hypertension    takes Ziac daily   Nocturia    Pneumonia    hx of-8+yrs ago   PONV (postoperative nausea and vomiting)    Shortness of breath dyspnea    thinks d/t pain meds and notices with exertion   SOBOE (shortness of breath on exertion)    Stroke ([place])    16 yrs ago--left eye    Swallowing difficulty     Past Surgical History:  Procedure Laterality Date   ABDOMINAL HYSTERECTOMY  1983   partial    ADENOIDECTOMY     ANTERIOR (CYSTOCELE) AND POSTERIOR REPAIR (RECTOCELE) WITH XENFORM GRAFT AND SACROSPINOUS FIXATION     APPENDECTOMY  1967   BACK SURGERY  2017   lower back   BREAST SURGERY Right    diseased milk glands    CHOLECYSTECTOMY  1983   COLONOSCOPY  04/13/2019   DIAGNOSTIC LAPAROSCOPY     cyst removed from ovaries    ESOPHAGOGASTRODUODENOSCOPY     lypoma  2011   NECK SURGERY  [phone]   c spine ant and post hx fusion and removal or harcdware and shavings   POLYPECTOMY     rods and screws in lumbar     TONSILLECTOMY     UPPER GASTROINTESTINAL ENDOSCOPY  04/13/2019    Family Psychiatric History: denies  Family History:  Family History  Problem Relation Age of Onset   CAD Father        tumor between heart and lung  Hypertension Father    Cancer Father    CAD Sister        ? dx    Hypertension Mother    Lung cancer Mother        died 68    Diabetes Maternal Grandmother        late 1s    Colon cancer Neg Hx    Colon polyps Neg Hx    Esophageal cancer Neg Hx    Rectal cancer Neg Hx    Stomach cancer Neg Hx     Social History:  Social History   Socioeconomic History   Marital status: Significant Other    Spouse name: [name]   Number of children: 2   Years of education: Not on file   Highest education level: Not on file  Occupational History   Occupation: retired    Fish farm manager: Scientist, research (physical sciences)  Tobacco Use   Smoking status: Former    Packs/day: 0.25    Years: 44.00    Total pack years: 11.00    Types: Cigarettes    Quit date: 02/04/2014    Years since quitting: 7.7   Smokeless tobacco: Never   Tobacco comments:    quit smoking in Dec 2015  Vaping Use   Vaping Use: Former  Substance and Sexual Activity   Alcohol use: Yes    Alcohol/week: 4.0 standard drinks of alcohol    Types: 4 Standard drinks or equivalent per week    Comment: couple times a week   Drug use: No   Sexual activity: Not Currently    Birth control/protection: Surgical    Comment: 1st intercourse 66 yo-Fewer than 5 partners  Other Topics Concern   Not on file  Social History Narrative   6-8 hours of sleep per night   Lives with her fiance hh of 2 no pets    1 dog in the home    Retired no ets  But tob 8 per day   etoh ocass 1-2    On disability from her neck surgery predicaments .   orig from [employer] in [place] area.    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 2   Last Pap 2006 last mammogram 2000 and   Has dentures   FA    Social Determinants of Health   Financial Resource Strain: Medium Risk (12/21/2020)   Overall Financial Resource Strain (CARDIA)    Difficulty of Paying Living Expenses: Somewhat hard  Food Insecurity: No Food Insecurity (12/21/2020)   Hunger Vital Sign    Worried About Running Out of Food in the Last Year: Never true    Ran Out of Food in the Last Year: Never true  Transportation Needs: No Transportation Needs (12/21/2020)   PRAPARE - Hydrologist (Medical): No    Lack of Transportation (Non-Medical): No  Physical Activity: Inactive (12/21/2020)   Exercise Vital Sign    Days of Exercise per Week: 0 days    Minutes of Exercise per Session: 0 min  Stress: No Stress Concern Present (12/21/2020)   [employer]    Feeling of Stress : Only a little  Social Connections: Socially Isolated (12/21/2020)   Social Connection and Isolation Panel [NHANES]    Frequency of Communication with Friends and Family: Twice a week    Frequency of Social Gatherings with Friends and Family: Three times a week    Attends Religious Services:  Never    Active Member of Clubs or Organizations: No    Attends Archivist Meetings: Never    Marital Status: Divorced    Allergies:  Allergies  Allergen Reactions   Wellbutrin [Bupropion] Nausea And Vomiting    Current Medications: Current Outpatient Medications  Medication Sig Dispense Refill   albuterol (PROAIR HFA) 108 (90 Base) MCG/ACT inhaler 2 puffs every 4 hours as needed only  if your can't catch your breath 18 g 11   allopurinol (ZYLOPRIM) 100 MG tablet TAKE ONE TABLET BY MOUTH TWICE  DAILY 60 tablet 1   bisoprolol-hydrochlorothiazide (ZIAC) 5-6.25 MG tablet TAKE ONE TABLET BY MOUTH TWICE DAILY 60 tablet 2   blood glucose meter kit and supplies KIT Dispense based on patient and insurance preference. Use up to four times daily as directed. 1 each 0   Budeson-Glycopyrrol-Formoterol (BREZTRI AEROSPHERE) 160-9-4.8 MCG/ACT AERO Inhale 2 puffs into the lungs 2 (two) times daily. 10.7 g 5   cloNIDine (CATAPRES) 0.1 MG tablet Take 1 tablet (0.1 mg total) by mouth 2 (two) times daily. 180 tablet 1   Colchicine 0.6 MG CAPS Take 0.6 mg by mouth 2 (two) times daily as needed. 60 capsule 1   doxepin (SINEQUAN) 25 MG capsule Take 1 capsule (25 mg total) by mouth every evening. 30 capsule 0   DULoxetine (CYMBALTA) 30 MG capsule Take 1 capsule (30 mg total) by mouth 2 (two) times daily for 7 days, THEN 2 capsules (60 mg total) 2 (two) times daily for 21 days. 60 capsule 2   gabapentin (NEURONTIN) 100 MG capsule TAKE TWO CAPSULES BY MOUTH three times daily 180 capsule 3   Glucose Blood (BLOOD GLUCOSE TEST STRIPS 333) STRP 1 strip by In Vitro route in the morning and at bedtime. 200 strip 1   Lancets MISC 1 Device by Does not apply route 2 (two) times daily. 200 each 1   omeprazole (PRILOSEC) 40 MG capsule TAKE ONE CAPSULE BY MOUTH TWICE DAILY Please schedule office visit for further refills 60 capsule 0   rosuvastatin (CRESTOR) 10 MG tablet TAKE ONE TABLET BY MOUTH EVERY EVENING 90 tablet 1   No current facility-administered medications for this visit.     Psychiatric Specialty Exam: Review of Systems  There were no vitals taken for this visit.There is no height or weight on file to calculate BMI.  General Appearance: Disheveled and Fairly Groomed  Eye Contact:  Good  Speech:  Clear and Coherent and Normal Rate  Volume:  Normal  Mood:  Euthymic  Affect:  Congruent  Thought Process:  Coherent and Goal Directed  Orientation:  Full (Time, Place, and Person)  Thought Content: Logical    Suicidal Thoughts:  No  Homicidal Thoughts:  No  Memory:  Immediate;   Good  Judgement:  Good  Insight:  Good  Psychomotor Activity:  NA  Concentration:  Concentration: Good  Recall:  Good  Fund of Knowledge: Good  Language: Good  Akathisia:  NA    AIMS (if indicated): not done  Assets:  Communication Skills Desire for Improvement Financial Resources/Insurance Housing Resilience Social Support  ADL's:  Intact  Cognition: WNL  Sleep:  Fair   Metabolic Disorder Labs: Lab Results  Component Value Date   HGBA1C 5.7 (A) 08/16/2021   No results found for: "PROLACTIN" Lab Results  Component Value Date   CHOL 184 05/11/2021   TRIG 238 (H) 05/11/2021   HDL 58 05/11/2021   CHOLHDL 3.2 05/11/2021   VLDL 51.0 (  H) 05/09/2020   LDLCALC 87 05/11/2021   LDLCALC 48 11/15/2020   Lab Results  Component Value Date   TSH 3.06 05/09/2020   TSH 1.64 06/30/2019    Therapeutic Level Labs: No results found for: "LITHIUM" No results found for: "VALPROATE" No results found for: "CBMZ"   Screenings: [place] Office Visit from 10/11/2021 in [facility] Office Visit from 08/16/2021 in [facility] at [place] from 07/11/2021 in [facility] at [place] from 12/21/2020 in [facility] [place] from 07/14/2020 in [facility]  PHQ-2 Total Score _0 PHQ-9 Total Score _1 -- 16       Collaboration of Care: Collaboration of Care: Medication Management AEB medication prescription  Patient/Guardian was advised Release of Information must be obtained prior to any record release in order to collaborate their care with an outside provider. Patient/Guardian was advised if they have not already done so to contact the registration department to sign all necessary forms in order for [place] to release information regarding their care.   Consent:  Patient/Guardian gives verbal consent for treatment and assignment of benefits for services provided during this visit. Patient/Guardian expressed understanding and agreed to proceed.    [name], MD 11/02/2021, 1:06 PM   Virtual Visit via Video Note  I connected with Samantha Hinton on 11/02/21 at  3:00 PM EDT by a video enabled telemedicine application and verified that I am speaking with the correct person using two identifiers.  Location: Patient: Home Provider: Home Office   I discussed the limitations of evaluation and management by telemedicine and the availability of in person appointments. The patient expressed understanding and agreed to proceed.   I discussed the assessment and treatment plan with the patient. The patient was provided an opportunity to ask questions and all were answered. The patient agreed with the plan and demonstrated an understanding of the instructions.   The patient was advised to call back or seek an in-person evaluation if the symptoms worsen or if the condition fails to improve as anticipated.  I provided 15 minutes of non-face-to-face time during this encounter.   [name], MD

## 2021-11-12 NOTE — Progress Notes (Signed)
Chief Complaint  Patient presents with   Follow-up    HPI: Samantha Hinton 66 y.o. come in for Chronic disease management  Last visit 6 23   Since then   counseling and  medications have helped   Son and family  helped clean out house and sell  and now in new place  doing ok  weanign off gabapentin  KK:[id number] much better  No  resp flares  Had minor injury and skin infection lle  getting better after antibiotic  HLD  no prob with crestor at this time Is not in weight management but is losing weight and pleased with that. ROS: See pertinent positives and negatives per HPI.  Past Medical History:  Diagnosis Date   Anxiety    Asthma    Chronic back pain    buldging disc and tumor.Spondylolisthesis   COPD (chronic obstructive pulmonary disease) (HCC)    Symbicort daily and ProAir as needed   Depression    Emphysema    Emphysema of lung ([place])    Fatty liver 04/13/2019   Fatty liver    Fibromyalgia    Gallbladder problem    Genital warts    GERD (gastroesophageal reflux disease)    takes Omeprazole daily   Heartburn    History of bronchitis 01/2014   History of colon polyps    Hyperlipidemia    has been off of meds d/t getting sick from them.Not been addressed again.    Hypertension    takes Ziac daily   Nocturia    Pneumonia    hx of-8+yrs ago   PONV (postoperative nausea and vomiting)    Shortness of breath dyspnea    thinks d/t pain meds and notices with exertion   SOBOE (shortness of breath on exertion)    Stroke ([place])    16 yrs ago--left eye    Swallowing difficulty     Family History  Problem Relation Age of Onset   CAD Father        tumor between heart and lung    Hypertension Father    Cancer Father    CAD Sister        ? dx    Hypertension Mother    Lung cancer Mother        died 34    Diabetes Maternal Grandmother        late 69s    Colon cancer Neg Hx    Colon polyps Neg Hx    Esophageal cancer Neg Hx    Rectal cancer Neg Hx    Stomach  cancer Neg Hx     Social History   Socioeconomic History   Marital status: Significant Other    Spouse name: [name]   Number of children: 2   Years of education: Not on file   Highest education level: Not on file  Occupational History   Occupation: retired    Fish farm manager: Scientist, research (physical sciences)  Tobacco Use   Smoking status: Former    Packs/day: 0.25    Years: 44.00    Total pack years: 11.00    Types: Cigarettes    Quit date: 02/04/2014    Years since quitting: 7.7   Smokeless tobacco: Never   Tobacco comments:    quit smoking in Dec 2015  Vaping Use   Vaping Use: Former  Substance and Sexual Activity   Alcohol use: Yes    Alcohol/week: 4.0 standard drinks of alcohol    Types: 4 Standard drinks or equivalent  per week    Comment: couple times a week   Drug use: No   Sexual activity: Not Currently    Birth control/protection: Surgical    Comment: 1st intercourse 66 yo-Fewer than 5 partners  Other Topics Concern   Not on file  Social History Narrative   6-8 hours of sleep per night   Lives with her fiance hh of 2 no pets    1 dog in the home   Retired no ets  But tob 8 per day   etoh ocass 1-2    On disability from her neck surgery predicaments .   orig from [employer] in [place] area.    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 2   Last Pap 2006 last mammogram 2000 and   Has dentures   FA    Social Determinants of Health   Financial Resource Strain: Medium Risk (12/21/2020)   Overall Financial Resource Strain (CARDIA)    Difficulty of Paying Living Expenses: Somewhat hard  Food Insecurity: No Food Insecurity (12/21/2020)   Hunger Vital Sign    Worried About Running Out of Food in the Last Year: Never true    Ran Out of Food in the Last Year: Never true  Transportation Needs: No Transportation Needs (12/21/2020)   PRAPARE - Hydrologist (Medical): No    Lack of Transportation (Non-Medical): No  Physical Activity:  Inactive (12/21/2020)   Exercise Vital Sign    Days of Exercise per Week: 0 days    Minutes of Exercise per Session: 0 min  Stress: No Stress Concern Present (12/21/2020)   [place]    Feeling of Stress : Only a little  Social Connections: Socially Isolated (12/21/2020)   Social Connection and Isolation Panel [NHANES]    Frequency of Communication with Friends and Family: Twice a week    Frequency of Social Gatherings with Friends and Family: Three times a week    Attends Religious Services: Never    Active Member of Clubs or Organizations: No    Attends Archivist Meetings: Never    Marital Status: Divorced    Outpatient Medications Prior to Visit  Medication Sig Dispense Refill   albuterol (PROAIR HFA) 108 (90 Base) MCG/ACT inhaler 2 puffs every 4 hours as needed only  if your can't catch your breath 18 g 11   allopurinol (ZYLOPRIM) 100 MG tablet TAKE ONE TABLET BY MOUTH TWICE DAILY 60 tablet 1   bisoprolol-hydrochlorothiazide (ZIAC) 5-6.25 MG tablet TAKE ONE TABLET BY MOUTH TWICE DAILY 60 tablet 2   blood glucose meter kit and supplies KIT Dispense based on patient and insurance preference. Use up to four times daily as directed. 1 each 0   Budeson-Glycopyrrol-Formoterol (BREZTRI AEROSPHERE) 160-9-4.8 MCG/ACT AERO Inhale 2 puffs into the lungs 2 (two) times daily. 10.7 g 5   cloNIDine (CATAPRES) 0.1 MG tablet Take 1 tablet (0.1 mg total) by mouth 2 (two) times daily. 180 tablet 1   Colchicine 0.6 MG CAPS Take 0.6 mg by mouth 2 (two) times daily as needed. 60 capsule 1   doxepin (SINEQUAN) 25 MG capsule Take 1 capsule (25 mg total) by mouth every evening. 30 capsule 0   DULoxetine (CYMBALTA) 60 MG capsule Take 1 capsule (60 mg total) by mouth daily. 30 capsule 1   gabapentin (NEURONTIN) 100 MG capsule TAKE TWO CAPSULES BY MOUTH three times daily 180 capsule  3   Glucose Blood (BLOOD GLUCOSE TEST STRIPS 333) STRP  1 strip by In Vitro route in the morning and at bedtime. 200 strip 1   Lancets MISC 1 Device by Does not apply route 2 (two) times daily. 200 each 1   omeprazole (PRILOSEC) 40 MG capsule TAKE ONE CAPSULE BY MOUTH TWICE DAILY Please schedule office visit for further refills 60 capsule 0   rosuvastatin (CRESTOR) 10 MG tablet TAKE ONE TABLET BY MOUTH EVERY EVENING 90 tablet 1   No facility-administered medications prior to visit.     EXAM:  BP 110/66 (BP Location: Left Arm, Patient Position: Sitting, Cuff Size: Large)   Pulse 79   Temp 97.8 F (36.6 C) (Oral)   Wt 205 lb 3.2 oz (93.1 kg)   SpO2 97%   BMI 34.15 kg/m   Body mass index is 34.15 kg/m. Wt Readings from Last 3 Encounters:  11/13/21 205 lb 3.2 oz (93.1 kg)  10/30/21 207 lb (93.9 kg)  08/16/21 210 lb 6.4 oz (95.4 kg)    GENERAL: vitals reviewed and listed above, alert, oriented, appears well hydrated and in no acute distress HEENT: atraumatic, conjunctiva  clear, no obvious abnormalities on inspection of external nose and ears NECK: no obvious masses on inspection palpation  LUNGS: clear to auscultation bilaterally, no wheezes, rales or rhonchi, good air movement CV: HRRR, no clubbing cyanosis or  peripheral edema nl cap refill  MS: moves all extremities without noticeable focal  abnormality healing  lle shin are  cound size abrasion PSYCH: pleasant and cooperative, Lab Results  Component Value Date   WBC 6.2 05/09/2020   HGB 13.6 05/09/2020   HCT 41.2 05/09/2020   PLT 195.0 05/09/2020   GLUCOSE 106 (H) 05/11/2021   CHOL 184 05/11/2021   TRIG 238 (H) 05/11/2021   HDL 58 05/11/2021   LDLDIRECT 242.0 05/09/2020   LDLCALC 87 05/11/2021   ALT 15 05/11/2021   AST 21 05/11/2021   NA 141 05/11/2021   K 4.4 05/11/2021   CL 95 (L) 05/11/2021   CREATININE 1.16 (H) 05/11/2021   BUN 31 (H) 05/11/2021   CO2 27 05/11/2021   TSH 3.06 05/09/2020   HGBA1C 5.7 (A) 11/13/2021   MICROALBUR 5.2 (H) 05/09/2020   BP Readings  from Last 3 Encounters:  11/13/21 110/66  10/30/21 (!) 152/86  08/16/21 138/80    ASSESSMENT AND PLAN:  Discussed the following assessment and plan:  Essential hypertension, benign - Plan: POC HgB A1c, Thyroid Panel With TSH, CBC with Differential/Platelet, Basic metabolic panel, Basic metabolic panel, CBC with Differential/Platelet, Thyroid Panel With TSH  Medication management - Plan: POC HgB A1c, Thyroid Panel With TSH, CBC with Differential/Platelet, Basic metabolic panel, Basic metabolic panel, CBC with Differential/Platelet, Thyroid Panel With TSH  Hyperlipidemia, unspecified hyperlipidemia type - Plan: POC HgB A1c, Thyroid Panel With TSH, CBC with Differential/Platelet, Basic metabolic panel, Basic metabolic panel, CBC with Differential/Platelet, Thyroid Panel With TSH  Hypertension associated with type 2 diabetes mellitus ([place]) - Plan: POC HgB A1c, Thyroid Panel With TSH, CBC with Differential/Platelet, Basic metabolic panel, Basic metabolic panel, CBC with Differential/Platelet, Thyroid Panel With TSH  Chronic obstructive pulmonary disease, unspecified COPD type ([place])  Bereavement reaction  Influenza vaccine needed - Plan: Flu Vaccine QUAD High Dose(Fluad) A1c is very good out of the diabetic range, blood pressure is much better She is moving forward with her grief and psychological reaction.  Appropriate. Psychiatrist would like an updated thyroid panel reordered in our system in  addition to CBC and BMP to check renal function. Advise consideration of RSV vaccine this fall because of her underlying lung issues. Update flu shot today. Follow okay 6 months check -Patient advised to return or notify health care team  if  new concerns arise.  Patient Instructions  Good to see you today .  Bp is good  checking thyroid and  blood count  and kidney function.   Plan ro v  in 6 months or as needed.  Advise getting rsv vaccine as discussed .    Standley Brooking. Panosh M.D.

## 2021-11-13 ENCOUNTER — Encounter: Payer: Self-pay | Admitting: Internal Medicine

## 2021-11-13 ENCOUNTER — Ambulatory Visit (INDEPENDENT_AMBULATORY_CARE_PROVIDER_SITE_OTHER): Payer: Medicare Other | Admitting: Internal Medicine

## 2021-11-13 VITALS — BP 110/66 | HR 79 | Temp 97.8°F | Wt 205.2 lb

## 2021-11-13 DIAGNOSIS — I10 Essential (primary) hypertension: Secondary | ICD-10-CM | POA: Diagnosis not present

## 2021-11-13 DIAGNOSIS — I152 Hypertension secondary to endocrine disorders: Secondary | ICD-10-CM | POA: Diagnosis not present

## 2021-11-13 DIAGNOSIS — Z634 Disappearance and death of family member: Secondary | ICD-10-CM

## 2021-11-13 DIAGNOSIS — Z79899 Other long term (current) drug therapy: Secondary | ICD-10-CM | POA: Diagnosis not present

## 2021-11-13 DIAGNOSIS — J449 Chronic obstructive pulmonary disease, unspecified: Secondary | ICD-10-CM

## 2021-11-13 DIAGNOSIS — E785 Hyperlipidemia, unspecified: Secondary | ICD-10-CM | POA: Diagnosis not present

## 2021-11-13 DIAGNOSIS — E1159 Type 2 diabetes mellitus with other circulatory complications: Secondary | ICD-10-CM | POA: Diagnosis not present

## 2021-11-13 DIAGNOSIS — F4320 Adjustment disorder, unspecified: Secondary | ICD-10-CM

## 2021-11-13 DIAGNOSIS — Z23 Encounter for immunization: Secondary | ICD-10-CM

## 2021-11-13 LAB — CBC WITH DIFFERENTIAL/PLATELET
Basophils Absolute: 0 10*3/uL (ref 0.0–0.1)
Basophils Relative: 0.5 % (ref 0.0–3.0)
Eosinophils Absolute: 0.1 10*3/uL (ref 0.0–0.7)
Eosinophils Relative: 1.6 % (ref 0.0–5.0)
HCT: 39.6 % (ref 36.0–46.0)
Hemoglobin: 13 g/dL (ref 12.0–15.0)
Lymphocytes Relative: 18 % (ref 12.0–46.0)
Lymphs Abs: 1.6 10*3/uL (ref 0.7–4.0)
MCHC: 32.9 g/dL (ref 30.0–36.0)
MCV: 87.2 fl (ref 78.0–100.0)
Monocytes Absolute: 0.5 10*3/uL (ref 0.1–1.0)
Monocytes Relative: 6.1 % (ref 3.0–12.0)
Neutro Abs: 6.7 10*3/uL (ref 1.4–7.7)
Neutrophils Relative %: 73.8 % (ref 43.0–77.0)
Platelets: 215 10*3/uL (ref 150.0–400.0)
RBC: 4.55 Mil/uL (ref 3.87–5.11)
RDW: 15.6 % — ABNORMAL HIGH (ref 11.5–15.5)
WBC: 9 10*3/uL (ref 4.0–10.5)

## 2021-11-13 LAB — BASIC METABOLIC PANEL
BUN: 14 mg/dL (ref 6–23)
CO2: 34 mEq/L — ABNORMAL HIGH (ref 19–32)
Calcium: 10.1 mg/dL (ref 8.4–10.5)
Chloride: 93 mEq/L — ABNORMAL LOW (ref 96–112)
Creatinine, Ser: 0.86 mg/dL (ref 0.40–1.20)
GFR: 70.61 mL/min (ref >60.00)
Glucose, Bld: 118 mg/dL — ABNORMAL HIGH (ref 70–99)
Potassium: 3.9 mEq/L (ref 3.5–5.1)
Sodium: 140 mEq/L (ref 135–145)

## 2021-11-13 LAB — POCT GLYCOSYLATED HEMOGLOBIN (HGB A1C): Hemoglobin A1C: 5.7 % — AB (ref 4.0–5.6)

## 2021-11-13 NOTE — Progress Notes (Signed)
Kidney function improved blood counts stable thyroid test results are not back yet pending

## 2021-11-13 NOTE — Patient Instructions (Signed)
Good to see you today .  Bp is good  checking thyroid and  blood count  and kidney function.   Plan ro v  in 6 months or as needed.  Advise getting rsv vaccine as discussed .

## 2021-11-14 ENCOUNTER — Other Ambulatory Visit (HOSPITAL_COMMUNITY): Payer: Self-pay | Admitting: *Deleted

## 2021-11-14 LAB — THYROID PANEL WITH TSH
Free Thyroxine Index: 2.7 (ref 1.4–3.8)
T3 Uptake: 27 % (ref 22–35)
T4, Total: 10.1 ug/dL (ref 5.1–11.9)
TSH: 1.21 mIU/L (ref 0.40–4.50)

## 2021-11-14 MED ORDER — DULOXETINE HCL 60 MG PO CPEP: 60.0000 mg | ORAL_CAPSULE | Freq: Two times a day (BID) | ORAL | 1 refills | Status: DC

## 2021-11-17 ENCOUNTER — Other Ambulatory Visit (HOSPITAL_COMMUNITY): Payer: Self-pay | Admitting: *Deleted

## 2021-11-17 MED ORDER — DULOXETINE HCL 60 MG PO CPEP: 60.0000 mg | ORAL_CAPSULE | Freq: Two times a day (BID) | ORAL | 1 refills | Status: DC

## 2021-11-23 ENCOUNTER — Ambulatory Visit (HOSPITAL_COMMUNITY): Payer: Medicare Other | Admitting: Licensed Clinical Social Worker

## 2021-11-29 ENCOUNTER — Other Ambulatory Visit (HOSPITAL_COMMUNITY): Payer: Self-pay | Admitting: Psychiatry

## 2021-11-29 ENCOUNTER — Other Ambulatory Visit: Payer: Self-pay | Admitting: Internal Medicine

## 2021-11-29 DIAGNOSIS — F331 Major depressive disorder, recurrent, moderate: Secondary | ICD-10-CM

## 2021-11-30 ENCOUNTER — Telehealth (HOSPITAL_BASED_OUTPATIENT_CLINIC_OR_DEPARTMENT_OTHER): Payer: Medicare Other | Admitting: Psychiatry

## 2021-11-30 ENCOUNTER — Encounter (HOSPITAL_COMMUNITY): Payer: Self-pay | Admitting: Psychiatry

## 2021-11-30 DIAGNOSIS — F331 Major depressive disorder, recurrent, moderate: Secondary | ICD-10-CM

## 2021-11-30 DIAGNOSIS — F4321 Adjustment disorder with depressed mood: Secondary | ICD-10-CM

## 2021-11-30 MED ORDER — HYDROXYZINE HCL 50 MG PO TABS: 50.0000 mg | ORAL_TABLET | Freq: Every day | ORAL | 1 refills | Status: DC

## 2021-11-30 MED ORDER — DULOXETINE HCL 60 MG PO CPEP: 60.0000 mg | ORAL_CAPSULE | Freq: Two times a day (BID) | ORAL | 1 refills | Status: DC

## 2021-11-30 NOTE — Progress Notes (Signed)
[facility] MD/PA/NP OP Progress Note  11/30/2021 9:30 AM Samantha Hinton  MRN:  [id number]  Visit Diagnosis:    ICD-10-CM   1. Moderate episode of recurrent major depressive disorder (HCC)  F33.1     2. Grief reaction  F43.21       Assessment: Patient is a 66 year old single female with a past psychiatric history of MDD, past trauma, and one prior psychiatric hospitalization in the 1990's following a medication overdose. She presented to [facility] behavioral health on 10/11/21 for initial evaluation of derepressed mood and increased alcohol use in the context of the passing of her partner of 14 years.   Patient endorsed neurovegetative symptoms of depression including hopelessness, anhedonia, amotivation, insomnia, guilt, increased appetite, racing thoughts, and grief response during initial evaluation. Patient had increased her alcohol use to  a couple beers a night as a coping mechanism, was cautioned on the dangers of alcohol use in depression, and encouraged to taper off her alcohol use. Patient has protective factors in her financial stability, and social support from her son and grandkids.   Patient met criteria for grief response and MDD at time of her initial evaluation.    Samantha Hinton presents for follow-up evaluation. Today, 11/30/21, patient reports mood remained stable on the Cymbalta with no adverse side effects.  Sleep however continues to be an issue and she has not noticed any positive effect from the doxepin.  We will discontinue doxepin today and start Atarax 50 mg at bedtime for insomnia.  Risk and benefits were discussed.   Plan: - Continue Cymbalta 60 mg BID  - Discontinue Doxepin 25 mg QHS for insomnia - Continue clonidine 0.1 BID for BP management - Patient weaning off gabapentin now at 200 mg BID for pain (managed by Dr. Tamala Julian) - TSH WNL - CBC, CMP, A1c, and glucose reviewed.  - Therapy Referral on 10/11/21 appointment scheduled for next month - Follow up in 6  weeks   Chief Complaint:  Chief Complaint  Patient presents with   Follow-up   Depression   HPI: Samantha Hinton reports that things have been pretty good for her, but in the process of filing bankruptcy. Was carrying the debt in the relationship, so she knew it was coming.  Patient reports not feeling depressed by it but more overwhelmed and will feel better once is all taken care of.  Mood wise she still feels the Cymbalta is managing her symptoms well and she has not been using alcohol.  Sleep has remained an issue however and she has noticed no real change with the doxepin.  Patient asked about progesterone for sleep and we discussed the indications of this medication.  We also explored her sleep pattern and she notes that she has worked on improving her sleep hygiene and that it still takes around 60 to 90 minutes to fall asleep.  After falling asleep she can wake up in the middle of the night occasionally with odd dreams or to go to the bathroom.  We discussed a couple medication options and decided to start hydroxyzine after going over the risk and benefits.  Past Psychiatric History: Patient reports an extensive past psychiatric history starting from a traumatic upbringing. She was started on antidepressant medications following a suicide attempt in the 90's, at which time she hospitalized on an inpatient psychiatric unit. Patient has tried Zoloft (negative side effects), Effexor (ineffective), Cymbalta, Wellbutrin, gabapentin, trazodone, and clonidine in the past.  Past Medical History:  Past Medical History:  Diagnosis Date   Anxiety    Asthma    Chronic back pain    buldging disc and tumor.Spondylolisthesis   COPD (chronic obstructive pulmonary disease) (HCC)    Symbicort daily and ProAir as needed   Depression    Emphysema    Emphysema of lung ([place])    Fatty liver 04/13/2019   Fatty liver    Fibromyalgia    Gallbladder problem    Genital warts    GERD (gastroesophageal reflux  disease)    takes Omeprazole daily   Heartburn    History of bronchitis 01/2014   History of colon polyps    Hyperlipidemia    has been off of meds d/t getting sick from them.Not been addressed again.    Hypertension    takes Ziac daily   Nocturia    Pneumonia    hx of-8+yrs ago   PONV (postoperative nausea and vomiting)    Shortness of breath dyspnea    thinks d/t pain meds and notices with exertion   SOBOE (shortness of breath on exertion)    Stroke ([place])    16 yrs ago--left eye    Swallowing difficulty     Past Surgical History:  Procedure Laterality Date   ABDOMINAL HYSTERECTOMY  1983   partial    ADENOIDECTOMY     ANTERIOR (CYSTOCELE) AND POSTERIOR REPAIR (RECTOCELE) WITH XENFORM GRAFT AND SACROSPINOUS FIXATION     APPENDECTOMY  1967   BACK SURGERY  2017   lower back   BREAST SURGERY Right    diseased milk glands   CHOLECYSTECTOMY  1983   COLONOSCOPY  04/13/2019   DIAGNOSTIC LAPAROSCOPY     cyst removed from ovaries    ESOPHAGOGASTRODUODENOSCOPY     lypoma  2011   NECK SURGERY  [phone]   c spine ant and post hx fusion and removal or harcdware and shavings   POLYPECTOMY     rods and screws in lumbar     TONSILLECTOMY     UPPER GASTROINTESTINAL ENDOSCOPY  04/13/2019    Family Psychiatric History: denies  Family History:  Family History  Problem Relation Age of Onset   CAD Father        tumor between heart and lung    Hypertension Father    Cancer Father    CAD Sister        ? dx    Hypertension Mother    Lung cancer Mother        died 51    Diabetes Maternal Grandmother        late 51s    Colon cancer Neg Hx    Colon polyps Neg Hx    Esophageal cancer Neg Hx    Rectal cancer Neg Hx    Stomach cancer Neg Hx     Social History:  Social History   Socioeconomic History   Marital status: Significant Other    Spouse name: [name]   Number of children: 2   Years of education: Not on file   Highest education level: Not on file   Occupational History   Occupation: retired    Fish farm manager: [name]  Tobacco Use   Smoking status: Former    Packs/day: 0.25    Years: 44.00    Total pack years: 11.00    Types: Cigarettes    Quit date: 02/04/2014    Years since quitting: 7.8   Smokeless tobacco: Never   Tobacco comments:    quit smoking in Dec 2015  Vaping  Use   Vaping Use: Former  Substance and Sexual Activity   Alcohol use: Yes    Alcohol/week: 4.0 standard drinks of alcohol    Types: 4 Standard drinks or equivalent per week    Comment: couple times a week   Drug use: No   Sexual activity: Not Currently    Birth control/protection: Surgical    Comment: 1st intercourse 66 yo-Fewer than 5 partners  Other Topics Concern   Not on file  Social History Narrative   6-8 hours of sleep per night   Lives with her fiance hh of 2 no pets    1 dog in the home   Retired no ets  But tob 8 per day   etoh ocass 1-2    On disability from her neck surgery predicaments .   orig from [employer] in [place] area.    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 2   Last Pap 2006 last mammogram 2000 and   Has dentures   FA    Social Determinants of Health   Financial Resource Strain: Medium Risk (12/21/2020)   Overall Financial Resource Strain (CARDIA)    Difficulty of Paying Living Expenses: Somewhat hard  Food Insecurity: No Food Insecurity (12/21/2020)   Hunger Vital Sign    Worried About Running Out of Food in the Last Year: Never true    Ran Out of Food in the Last Year: Never true  Transportation Needs: No Transportation Needs (12/21/2020)   PRAPARE - Hydrologist (Medical): No    Lack of Transportation (Non-Medical): No  Physical Activity: Inactive (12/21/2020)   Exercise Vital Sign    Days of Exercise per Week: 0 days    Minutes of Exercise per Session: 0 min  Stress: No Stress Concern Present (12/21/2020)   [place]    Feeling of Stress : Only a little  Social Connections: Socially Isolated (12/21/2020)   Social Connection and Isolation Panel [NHANES]    Frequency of Communication with Friends and Family: Twice a week    Frequency of Social Gatherings with Friends and Family: Three times a week    Attends Religious Services: Never    Active Member of Clubs or Organizations: No    Attends Archivist Meetings: Never    Marital Status: Divorced    Allergies:  Allergies  Allergen Reactions   Wellbutrin [Bupropion] Nausea And Vomiting    Current Medications: Current Outpatient Medications  Medication Sig Dispense Refill   albuterol (PROAIR HFA) 108 (90 Base) MCG/ACT inhaler 2 puffs every 4 hours as needed only  if your can't catch your breath 18 g 11   allopurinol (ZYLOPRIM) 100 MG tablet TAKE ONE TABLET BY MOUTH TWICE DAILY 60 tablet 1   bisoprolol-hydrochlorothiazide (ZIAC) 5-6.25 MG tablet TAKE ONE TABLET BY MOUTH TWICE DAILY 60 tablet 2   blood glucose meter kit and supplies KIT Dispense based on patient and insurance preference. Use up to four times daily as directed. 1 each 0   Budeson-Glycopyrrol-Formoterol (BREZTRI AEROSPHERE) 160-9-4.8 MCG/ACT AERO Inhale 2 puffs into the lungs 2 (two) times daily. 10.7 g 5   cloNIDine (CATAPRES) 0.1 MG tablet Take 1 tablet (0.1 mg total) by mouth 2 (two) times daily. 180 tablet 1   Colchicine 0.6 MG CAPS Take 0.6 mg by mouth 2 (two) times daily as needed. 60 capsule 1   doxepin (SINEQUAN) 25 MG  capsule TAKE 1 CAPSULE BY MOUTH EVERY EVENING. 30 capsule 0   DULoxetine (CYMBALTA) 60 MG capsule Take 1 capsule (60 mg total) by mouth 2 (two) times daily. 60 capsule 1   gabapentin (NEURONTIN) 100 MG capsule TAKE TWO CAPSULES BY MOUTH three times daily 180 capsule 3   Glucose Blood (BLOOD GLUCOSE TEST STRIPS 333) STRP 1 strip by In Vitro route in the morning and at bedtime. 200 strip 1   Lancets MISC 1 Device by Does not apply route 2  (two) times daily. 200 each 1   omeprazole (PRILOSEC) 40 MG capsule TAKE ONE CAPSULE BY MOUTH TWICE DAILY Please schedule office visit for further refills 60 capsule 0   rosuvastatin (CRESTOR) 10 MG tablet TAKE ONE TABLET BY MOUTH EVERY EVENING 90 tablet 1   No current facility-administered medications for this visit.     Psychiatric Specialty Exam: Review of Systems  There were no vitals taken for this visit.There is no height or weight on file to calculate BMI.  General Appearance: Fairly Groomed  Eye Contact:  Good  Speech:  Clear and Coherent  Volume:  Normal  Mood:  Euthymic  Affect:  Congruent  Thought Process:  Coherent and Linear  Orientation:  Full (Time, Place, and Person)  Thought Content: Logical   Suicidal Thoughts:  No  Homicidal Thoughts:  No  Memory:  NA  Judgement:  Good  Insight:  Good  Psychomotor Activity:  Normal  Concentration:  Concentration: Good  Recall:  Good  Fund of Knowledge: Good  Language: Good  Akathisia:  NA    AIMS (if indicated): not done  Assets:  Desire for Improvement Housing Social Support  ADL's:  Intact  Cognition: WNL  Sleep:  Fair   Metabolic Disorder Labs: Lab Results  Component Value Date   HGBA1C 5.7 (A) 11/13/2021   No results found for: "PROLACTIN" Lab Results  Component Value Date   CHOL 184 05/11/2021   TRIG 238 (H) 05/11/2021   HDL 58 05/11/2021   CHOLHDL 3.2 05/11/2021   VLDL 51.0 (H) 05/09/2020   LDLCALC 87 05/11/2021   LDLCALC 48 11/15/2020   Lab Results  Component Value Date   TSH 1.21 11/13/2021   TSH 3.06 05/09/2020    Therapeutic Level Labs: No results found for: "LITHIUM" No results found for: "VALPROATE" No results found for: "CBMZ"   Screenings: PHQ2-9    [facility] Office Visit from 11/13/2021 in [place] at [place] from 10/11/2021 in [facility] Office Visit from 08/16/2021 in [facility] at [place] from 07/11/2021 in [facility] at [place] from 12/21/2020 in [facility] at [facility] Total Score _0 PHQ-9 Total Score _1 --       Collaboration of Care: Collaboration of Care: Medication Management AEB medication prescription  Patient/Guardian was advised Release of Information must be obtained prior to any record release in order to collaborate their care with an outside provider. Patient/Guardian was advised if they have not already done so to contact the registration department to sign all necessary forms in order for [place] to release information regarding their care.   Consent: Patient/Guardian gives verbal consent for treatment and assignment of benefits for services provided during this visit. Patient/Guardian expressed understanding and agreed to proceed.    [name], MD 11/30/2021, 9:30 AM   Virtual Visit via Video Note  I connected with [name] on  11/30/21 at 10:30 AM EDT by a video enabled telemedicine application and verified that I am speaking with the correct person using two identifiers.  Location: Patient: Home Provider: Home Office   I discussed the limitations of evaluation and management by telemedicine and the availability of in person appointments. The patient expressed understanding and agreed to proceed.   I discussed the assessment and treatment plan with the patient. The patient was provided an opportunity to ask questions and all were answered. The patient agreed with the plan and demonstrated an understanding of the instructions.   The patient was advised to call back or seek an in-person evaluation if the symptoms worsen or if the condition fails to improve as anticipated.  I provided 25 minutes of non-face-to-face time during this encounter.   [name], MD

## 2021-12-01 ENCOUNTER — Telehealth (HOSPITAL_COMMUNITY): Payer: Medicare Other | Admitting: Psychiatry

## 2021-12-03 NOTE — Progress Notes (Signed)
Thyroid tests are normal range .  I was out of office for 2 weeks  . No change in plans

## 2021-12-05 ENCOUNTER — Telehealth: Payer: Self-pay | Admitting: Internal Medicine

## 2021-12-05 MED ORDER — PREDNISONE 10 MG PO TABS: ORAL_TABLET | ORAL | 0 refills | Status: DC

## 2021-12-05 NOTE — Telephone Encounter (Signed)
Patient called to request a refill for her Prednisone be sent to [facility] on [address].  She is having problems catching her breath.

## 2021-12-05 NOTE — Telephone Encounter (Signed)
She is actually supposed to have prednisone as "plan D" according to her last action plan /AVS  So yes,  rx = prednisone x 6 days:  Prednisone 10 mg take  4 each am x 2 days,   2 each am x 2 days,  1 each am x 2 days and stop   Overdue for f/u so be sure gets back to see me next available

## 2021-12-05 NOTE — Telephone Encounter (Signed)
Called and spoke with patient. She stated that she has been having a hard time with SOB for the past 3 days. She has been using her albuterol nebs, albuterol HFA as well as her Breztri for relief but she has not noticed a difference. She has a cough but it is not productive. She is not able to cough up the phlegm. She denied any recent sick exposures or fevers. She has been wheezing more as well.   She wanted to know if MW would be willing to send in a prednisone taper for her.   [facility] [address]   MW, can you please advise? Thanks!

## 2021-12-05 NOTE — Telephone Encounter (Signed)
Called and spoke with patient. I was able to get her scheduled for a F/U on 10/20 at 230pm. She verbalized understanding of the prednisone taper. RX has been sent.   Nothing further needed at time of call.

## 2021-12-05 NOTE — Telephone Encounter (Signed)
Called patient but she did not answer. Left message for patient to call back.  

## 2021-12-06 ENCOUNTER — Other Ambulatory Visit: Payer: Self-pay

## 2021-12-06 ENCOUNTER — Other Ambulatory Visit: Payer: Self-pay | Admitting: Internal Medicine

## 2021-12-07 NOTE — Progress Notes (Signed)
For now ask dr Melvyn Novas and we can discuss at next visit if we take over this medication management.  Fyi  omeprezoel is OTC. At 20 mg dose

## 2021-12-08 ENCOUNTER — Encounter: Payer: Self-pay | Admitting: Internal Medicine

## 2021-12-08 ENCOUNTER — Ambulatory Visit: Payer: Medicare Other | Admitting: Internal Medicine

## 2021-12-08 DIAGNOSIS — Z87891 Personal history of nicotine dependence: Secondary | ICD-10-CM | POA: Diagnosis not present

## 2021-12-08 DIAGNOSIS — I10 Essential (primary) hypertension: Secondary | ICD-10-CM

## 2021-12-08 DIAGNOSIS — J449 Chronic obstructive pulmonary disease, unspecified: Secondary | ICD-10-CM

## 2021-12-08 MED ORDER — BREZTRI AEROSPHERE 160-9-4.8 MCG/ACT IN AERO: 2.0000 | INHALATION_SPRAY | Freq: Two times a day (BID) | RESPIRATORY_TRACT | 3 refills | Status: DC

## 2021-12-08 MED ORDER — OMEPRAZOLE 40 MG PO CPDR: DELAYED_RELEASE_CAPSULE | ORAL | 11 refills | Status: AC

## 2021-12-08 MED ORDER — PREDNISONE 10 MG PO TABS: ORAL_TABLET | ORAL | 11 refills | Status: DC

## 2021-12-08 NOTE — Assessment & Plan Note (Addendum)
Quit smoking 01/2014   - PFT's 06/12/2012 FEV1  1.41 (60%) ratio 64 and no better p B2  And DLCO 51 corrects to 94 % - 06/12/2012 refer to Rehab - 08/02/2012 started tudorza > improved - 06/19/2013  >  Started symbicort 2 bid  - 08/11/2018    change symb to bevespi 2 bid And max gerd rx   Alpha one AT screen 08/11/2018  MM  Level  145  - 09/10/2018   continue bevespi - 06/29/2019   change to breztri  - 05/05/2020 added pred x 6 days as Plan D  - 12/08/2021  After extensive coaching inhaler device,  effectiveness =    90% > continue breztri    Group D (now reclassified as E) in terms of symptom/risk and laba/lama/ICS  therefore appropriate rx at this point >>>  Breztri, approp saba and continue prednisone as plan D  Re SABA :  I spent extra time with pt today reviewing appropriate use of albuterol for prn use on exertion with the following points: 1) saba is for relief of sob that does not improve by walking a slower pace or resting but rather if the pt does not improve after trying this first. 2) If the pt is convinced, as many are, that saba helps recover from activity faster then it's easy to tell if this is the case by re-challenging : ie stop, take the inhaler, then p 5 minutes try the exact same activity (intensity of workload) that just caused the symptoms and see if they are substantially diminished or not after saba 3) if there is an activity that reproducibly causes the symptoms, try the saba 15 min before the activity on alternate days   If in fact the saba really does help, then fine to continue to use it prn but advised may need to look closer at the maintenance regimen being used to achieve better control of airways disease with exertion.    F/u can continue  be yearly  - sooner prn         Each maintenance medication was reviewed in detail including emphasizing most importantly the difference between maintenance and prns and under what circumstances the prns are to be triggered using  an action plan format where appropriate.  Total time for H and P, chart review, counseling, reviewing hfa device(s) and generating customized AVS unique to this office visit / same day charting = 34 min yearly ov

## 2021-12-08 NOTE — Assessment & Plan Note (Signed)
Changed from acei to bystolic 06/13/71 - increased ziac to bid 09/10/2018  - 12/29/2018 added clonidine 0.1 bid for chronic pain/hbp   A bit high today s symptoms, rec f/u PCP but no change in rx for now

## 2021-12-08 NOTE — Patient Instructions (Signed)
My office will be contacting you by phone for referral to lung cancer screening program   - if you don't hear back from my office within one week please call us back or notify us thru MyChart and we'll address it right away.   Plan A = Automatic = Always=    Breztri Take 2 puffs first thing in am and then another 2 puffs about 12 hours later.     Plan B = Backup (to supplement plan A, not to replace it) Only use your albuterol inhaler as a rescue medication to be used if you can't catch your breath by resting or doing a relaxed purse lip breathing pattern.  - The less you use it, the better it will work when you need it. - Ok to use the inhaler up to 2 puffs  every 4 hours if you must but call for appointment if use goes up over your usual need - Don't leave home without it !!  (think of it like the spare tire for your car)   Plan C = Crisis (instead of Plan B but only if Plan B stops working) - only use your albuterol nebulizer if you first try Plan B and it fails to help > ok to use the nebulizer up to every 4 hours but if start needing it regularly call for immediate appointment   Plan D = Deltasone when ABC not working  Prednisone 10 mg take  4 each am x 2 days,   2 each am x 2 days,  1 each am x 2 days and stop    Please schedule a follow up visit in 12 months but call sooner if needed

## 2021-12-08 NOTE — Progress Notes (Signed)
Subjective:   Patient ID: Samantha Hinton, female    DOB: 28-Mar-1955 MRN: [id number]    Brief patient profile:  58   yowf MM quit smoking 01/2014  with GOLD II COPD  baseline doe x best days able to do extensive shopping s 02 better with albuterol when had access to it then admitted [facility]:   Admit Date: 02/17/2012  PRIMARY DISCHARGE DIAGNOSIS:  URI (upper respiratory infection)  COPD exacerbation  SOB (shortness of breath)  Hypoxemia  UTI (lower urinary tract infection)  Diarrhea  Tobacco abuse    04/10/2012 1st pulmonary ov  On ACEI cc doe x 5 steps on neb and albuterol 7 x daily with persistent sense of chest tightness no better since discharge rec Stop lisinopril and start bystolic 5 mg one daily Start dulera 100 Take 2 puffs first thing in am and then another 2 puffs about 12 hours later.  Plan B  Only use your albuterol inhaler, plan C is your nebulizer) as a rescue medication   GERD diet   08/11/2018 acute extended ov/Samantha Hinton re:  Re-establish GOLD II copd/ worse sob still on symb 160 2bid  Chief Complaint  Patient presents with   Pulmonary Consult    Self referral. Pt c/o increased SOB since Spring 2020. She gets winded walking room to room at home.   MMRC3 = can't walk 100 yards even at a slow pace at a flat grade s stopping due to sob  Progressive worse no variability since December 2019 Hs = gurgly on back / overt hb on just prilosec 20 mg not ac No cough  Mild leg swelling   Neb but never before ex  rec Plan A = Automatic = Bevespi Take 2 puffs first thing in am and then another 2 puffs about 12 hours later.  Work on inhaler technique:   Plan B = Backup Only use your albuterol inhaler as a rescue medication to be used if you can't catch your breath Plan C = Crisis - only use your albuterol nebulizer if you first try Plan B and it fails to help > ok to use the nebulizer up to every 4 hours but if start needing it regularly call for immediate appointment Omeprazole 40  mg Take 30- 60 min before your first and last meals of the day  GERD diet    11/14/2020  f/u ov/Samantha Hinton re: GOLD 2 COPD    maint on breztri and one course of pred since rx   Chief Complaint  Patient presents with   Follow-up    Doing well   Dyspnea:  better, uses HC parking , ok wm shopping, not doing any housekeeping One flight of steps stops half was  Cough: none  Sleeping: 30 degrees with flat bed/ pilllows, better overall SABA use: occ hfa p ex never pretreats  02: none Covid status:   x  3  Rec Flu shot today and check with your pharmacy for the new covid shot that covers omicron variant  Ok to try albuterol 15 min before an activity (on alternating days)  that you know would usually make you short of breath        12/08/2021  f/u ov/Samantha Hinton re: GOLD 2  maint on breztri 2bid /  pred is plan D  Chief Complaint  Patient presents with   Follow-up    Increased cough and wheezing over the past wk- on pred taper and this has helped some.   Dyspnea:  wm shopping / walking  to grandson's football games much better on pred Cough: none  Sleeping: 30 degrees no resp cc  SABA use: twice daily before exertion  02: none   Lung cancer screening :  referred     No obvious day to day or daytime variability or assoc excess/ purulent sputum or mucus plugs or hemoptysis or cp or chest tightness,   or overt sinus or hb symptoms.   Sleeping  without nocturnal  or early am exacerbation  of respiratory  c/o's or need for noct saba. Also denies any obvious fluctuation of symptoms with weather or environmental changes or other aggravating or alleviating factors except as outlined above   No unusual exposure hx or h/o childhood pna/ asthma or knowledge of premature birth.  Current Allergies, Complete Past Medical History, Past Surgical History, Family History, and Social History were reviewed in [employer] record.  ROS  The following are not active complaints unless  bolded Hoarseness, sore throat, dysphagia, dental problems, itching, sneezing,  nasal congestion or discharge of excess mucus or purulent secretions, ear ache,   fever, chills, sweats, unintended wt loss or wt gain, classically pleuritic or exertional cp,  orthopnea pnd or arm/hand swelling  or leg swelling, presyncope, palpitations, abdominal pain, anorexia, nausea, vomiting, diarrhea  or change in bowel habits or change in bladder habits, change in stools or change in urine, dysuria, hematuria,  rash, arthralgias, visual complaints, headache, numbness, weakness or ataxia or problems with walking or coordination,  change in mood or  memory.        Current Meds  Medication Sig   albuterol (PROAIR HFA) 108 (90 Base) MCG/ACT inhaler 2 puffs every 4 hours as needed only  if your can't catch your breath   allopurinol (ZYLOPRIM) 100 MG tablet TAKE ONE TABLET BY MOUTH TWICE DAILY   bisoprolol-hydrochlorothiazide (ZIAC) 5-6.25 MG tablet TAKE ONE TABLET BY MOUTH TWICE DAILY   blood glucose meter kit and supplies KIT Dispense based on patient and insurance preference. Use up to four times daily as directed.   Budeson-Glycopyrrol-Formoterol (BREZTRI AEROSPHERE) 160-9-4.8 MCG/ACT AERO Inhale 2 puffs into the lungs 2 (two) times daily.   cloNIDine (CATAPRES) 0.1 MG tablet Take 1 tablet (0.1 mg total) by mouth 2 (two) times daily.   Colchicine 0.6 MG CAPS Take 0.6 mg by mouth 2 (two) times daily as needed.   doxepin (SINEQUAN) 25 MG capsule TAKE 1 CAPSULE BY MOUTH EVERY EVENING.   DULoxetine (CYMBALTA) 60 MG capsule Take 1 capsule (60 mg total) by mouth 2 (two) times daily.   gabapentin (NEURONTIN) 100 MG capsule TAKE TWO CAPSULES BY MOUTH three times daily   Glucose Blood (BLOOD GLUCOSE TEST STRIPS 333) STRP 1 strip by In Vitro route in the morning and at bedtime.   hydrOXYzine (ATARAX) 50 MG tablet Take 1 tablet (50 mg total) by mouth at bedtime.   Lancets MISC 1 Device by Does not apply route 2 (two) times  daily.   omeprazole (PRILOSEC) 40 MG capsule TAKE ONE CAPSULE BY MOUTH TWICE DAILY Please schedule office visit for further refills   predniSONE (DELTASONE) 10 MG tablet Take 4 tabs x 2 days, 2 tabs x 2 days, then 1 tab x 2 days and stop.   rosuvastatin (CRESTOR) 10 MG tablet TAKE ONE TABLET BY MOUTH EVERY EVENING         Objective:   Physical Exam  wts  12/08/2021 208   11/14/2020   204  11/14/2020   232 06/29/2019  224  12/29/2018  226  09/10/2018  227  08/11/2018  226  05/08/2012  Wt 195  > 199 06/12/2012 > 201 08/01/2012 > 09/23/2012  204 > 06/19/2013  199 > 07/01/2013  195 > 08/13/2014 183     04/10/12 194 lb 9.6 oz (88.27 kg)  02/20/12 190 lb 12.8 oz (86.546 kg)    Vital signs reviewed  12/08/2021  - Note at rest 02 sats  96% on RA   General appearance:    amb wf nad    HEENT :  Oropharynx  slt thrush  Nasal turbinates nl    NECK :  without JVD/Nodes/TM/ nl carotid upstrokes bilaterally   LUNGS: no acc muscle use,  Mod barrel  contour chest wall with bilateral  Distant bs s audible wheeze and  without cough on insp or exp maneuvers and mod  Hyperresonant  to  percussion bilaterally     CV:  RRR  no s3 or murmur or increase in P2, and no edema   ABD:  soft and nontender with pos mid insp Hoover's  in the supine position. No bruits or organomegaly appreciated, bowel sounds nl  MS:   Ext warm without deformities or   obvious joint restrictions , calf tenderness, cyanosis or clubbing  SKIN: warm and dry without lesions    NEURO:  alert, approp, nl sensorium with  no motor or cerebellar deficits apparent.                Assessment & Plan:

## 2021-12-27 ENCOUNTER — Other Ambulatory Visit: Payer: Self-pay | Admitting: Internal Medicine

## 2021-12-28 ENCOUNTER — Ambulatory Visit (HOSPITAL_COMMUNITY): Payer: Medicare Other | Admitting: Licensed Clinical Social Worker

## 2022-01-08 ENCOUNTER — Telehealth: Payer: Self-pay

## 2022-01-08 NOTE — Telephone Encounter (Signed)
Unsuccessful attempt to reach patient on preferred number listed in notes for scheduled AWV. Left message on voicemail okay to reschedule.

## 2022-01-09 ENCOUNTER — Encounter: Payer: Medicare Other | Admitting: Internal Medicine

## 2022-01-09 ENCOUNTER — Telehealth: Payer: Self-pay | Admitting: Internal Medicine

## 2022-01-09 NOTE — Telephone Encounter (Signed)
Left message for patient to call back and schedule Medicare Annual Wellness Visit (AWV) either virtually or in office. Left  my Samantha Hinton number [phone]   Last AWV 12/21/20 please schedule with Nurse Health Adviser   45 min for awv-i and in office appointments 30 min for awv-s  phone/virtual appointments

## 2022-01-10 ENCOUNTER — Telehealth (HOSPITAL_COMMUNITY): Payer: Medicare Other | Admitting: Psychiatry

## 2022-01-11 ENCOUNTER — Other Ambulatory Visit (HOSPITAL_COMMUNITY): Payer: Self-pay | Admitting: Psychiatry

## 2022-01-11 ENCOUNTER — Other Ambulatory Visit: Payer: Self-pay | Admitting: Internal Medicine

## 2022-01-11 DIAGNOSIS — F331 Major depressive disorder, recurrent, moderate: Secondary | ICD-10-CM

## 2022-01-13 ENCOUNTER — Other Ambulatory Visit (HOSPITAL_COMMUNITY): Payer: Self-pay | Admitting: Psychiatry

## 2022-01-13 DIAGNOSIS — F331 Major depressive disorder, recurrent, moderate: Secondary | ICD-10-CM

## 2022-01-15 ENCOUNTER — Encounter (HOSPITAL_COMMUNITY): Payer: Self-pay | Admitting: Psychiatry

## 2022-01-15 ENCOUNTER — Telehealth (HOSPITAL_BASED_OUTPATIENT_CLINIC_OR_DEPARTMENT_OTHER): Payer: Medicare Other | Admitting: Psychiatry

## 2022-01-15 DIAGNOSIS — F331 Major depressive disorder, recurrent, moderate: Secondary | ICD-10-CM

## 2022-01-15 MED ORDER — HYDROXYZINE HCL 50 MG PO TABS: 50.0000 mg | ORAL_TABLET | Freq: Every day | ORAL | 1 refills | Status: DC

## 2022-01-15 MED ORDER — MIRTAZAPINE 15 MG PO TBDP: 7.5000 mg | ORAL_TABLET | Freq: Every day | ORAL | 2 refills | Status: DC

## 2022-01-15 MED ORDER — DULOXETINE HCL 60 MG PO CPEP: 60.0000 mg | ORAL_CAPSULE | Freq: Two times a day (BID) | ORAL | 0 refills | Status: DC

## 2022-01-15 NOTE — Progress Notes (Signed)
[place] MD/PA/NP OP Progress Note  01/15/2022 2:17 PM Samantha Hinton  MRN:  [id number]  Visit Diagnosis:    ICD-10-CM   1. Moderate episode of recurrent major depressive disorder (HCC)  F33.1       Assessment: Patient is a 66 year old single female with a past psychiatric history of MDD, past trauma, and one prior psychiatric hospitalization in the 1990's following a medication overdose. She presented to [facility] behavioral health on 10/11/21 for initial evaluation of derepressed mood and increased alcohol use in the context of the passing of her partner of 14 years.   Patient endorsed neurovegetative symptoms of depression including hopelessness, anhedonia, amotivation, insomnia, guilt, increased appetite, racing thoughts, and grief response during initial evaluation. Patient had increased her alcohol use to  a couple beers a night as a coping mechanism, was cautioned on the dangers of alcohol use in depression, and encouraged to taper off her alcohol use. Patient has protective factors in her financial stability, and social support from her son and grandkids.   Patient met criteria for grief response and MDD at time of her initial evaluation.    Samantha Hinton presents for follow-up evaluation. Today, 01/15/22, patient reports mood remained stable on the Cymbalta with no adverse side effects.  Sleep remains an issue with no significant change from the addition of Atarax.  We will start Remeron 7.5 at bedtime today and went over the risk and benefits.   Plan: - Continue Cymbalta 60 mg BID  - Start Remeron 7.5 mg QHS  - Continue Atarax 50 mg nightly as needed for insomnia - Discontinued Doxepin due to lack of benefit - Continue clonidine 0.1 BID for BP management - Patient weaning off gabapentin now at 200 mg BID for pain (managed by Dr. Tamala Julian) - TSH WNL - CBC, CMP, A1c, and glucose reviewed.  - Therapy Referral on 10/11/21 appointment scheduled for next month - Follow up in 8  weeks   Chief Complaint:  Chief Complaint  Patient presents with   Follow-up   Medication Refill   HPI: Samantha Hinton presents reporting that the last month has had some ups and downs, but overall the medicine seems to be settling things down.  She notes that she has been getting more exercise lately and has been attending her grandkids games.  Patient also notes that she attended her first Thanksgiving since the passing of her spouse which was difficult, but it was nice to have family around during that time.  Overall she feels like her mood has been fairly stable and gradually improving.  She is continuing to work on setting up her house and is waiting on her son to help move some of the heavier objects.  Patient also notes having started a diet and is eating smaller portions which has resulted in her losing 6 pounds.  Her sleep continues to be an issue with some nights being better than others.  More often than not though the sleep is broken up throughout the night.  We discussed the possibility of starting Remeron again which patient was open to trying.  Risk and benefits were discussed.  Patient will monitor for potential weight gain.  Past Psychiatric History: Patient reports an extensive past psychiatric history starting from a traumatic upbringing. She was started on antidepressant medications following a suicide attempt in the 90's, at which time she hospitalized on an inpatient psychiatric unit. Patient has tried Zoloft (negative side effects), Effexor (ineffective), Cymbalta, Wellbutrin, gabapentin, trazodone, doxepin, Atarax, and  clonidine in the past.  Started Remeron on 01/15/2022.  Patient denies any alcohol use in the last several months.  Past Medical History:  Past Medical History:  Diagnosis Date   Anxiety    Asthma    Chronic back pain    buldging disc and tumor.Spondylolisthesis   COPD (chronic obstructive pulmonary disease) (HCC)    Symbicort daily and ProAir as needed    Depression    Emphysema    Emphysema of lung ([place])    Fatty liver 04/13/2019   Fatty liver    Fibromyalgia    Gallbladder problem    Genital warts    GERD (gastroesophageal reflux disease)    takes Omeprazole daily   Heartburn    History of bronchitis 01/2014   History of colon polyps    Hyperlipidemia    has been off of meds d/t getting sick from them.Not been addressed again.    Hypertension    takes Ziac daily   Nocturia    Pneumonia    hx of-8+yrs ago   PONV (postoperative nausea and vomiting)    Shortness of breath dyspnea    thinks d/t pain meds and notices with exertion   SOBOE (shortness of breath on exertion)    Stroke ([place])    16 yrs ago--left eye    Swallowing difficulty     Past Surgical History:  Procedure Laterality Date   ABDOMINAL HYSTERECTOMY  1983   partial    ADENOIDECTOMY     ANTERIOR (CYSTOCELE) AND POSTERIOR REPAIR (RECTOCELE) WITH XENFORM GRAFT AND SACROSPINOUS FIXATION     APPENDECTOMY  1967   BACK SURGERY  2017   lower back   BREAST SURGERY Right    diseased milk glands   CHOLECYSTECTOMY  1983   COLONOSCOPY  04/13/2019   DIAGNOSTIC LAPAROSCOPY     cyst removed from ovaries    ESOPHAGOGASTRODUODENOSCOPY     lypoma  2011   NECK SURGERY  [phone]   c spine ant and post hx fusion and removal or harcdware and shavings   POLYPECTOMY     rods and screws in lumbar     TONSILLECTOMY     UPPER GASTROINTESTINAL ENDOSCOPY  04/13/2019    Family Psychiatric History: denies  Family History:  Family History  Problem Relation Age of Onset   CAD Father        tumor between heart and lung    Hypertension Father    Cancer Father    CAD Sister        ? dx    Hypertension Mother    Lung cancer Mother        died 61    Diabetes Maternal Grandmother        late 89s    Colon cancer Neg Hx    Colon polyps Neg Hx    Esophageal cancer Neg Hx    Rectal cancer Neg Hx    Stomach cancer Neg Hx     Social History:  Social History    Socioeconomic History   Marital status: Significant Other    Spouse name: [name]   Number of children: 2   Years of education: Not on file   Highest education level: Not on file  Occupational History   Occupation: retired    Fish farm manager: [name]  Tobacco Use   Smoking status: Former    Packs/day: 0.25    Years: 44.00    Total pack years: 11.00    Types: Cigarettes  Quit date: 02/04/2014    Years since quitting: 7.9   Smokeless tobacco: Never   Tobacco comments:    quit smoking in Dec 2015  Vaping Use   Vaping Use: Former  Substance and Sexual Activity   Alcohol use: Yes    Alcohol/week: 4.0 standard drinks of alcohol    Types: 4 Standard drinks or equivalent per week    Comment: couple times a week   Drug use: No   Sexual activity: Not Currently    Birth control/protection: Surgical    Comment: 1st intercourse 66 yo-Fewer than 5 partners  Other Topics Concern   Not on file  Social History Narrative   6-8 hours of sleep per night   Lives with her fiance hh of 2 no pets    1 dog in the home   Retired no ets  But tob 8 per day   etoh ocass 1-2    On disability from her neck surgery predicaments .   orig from [employer] in [place] area.    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 2   Last Pap 2006 last mammogram 2000 and   Has dentures   FA    Social Determinants of Health   Financial Resource Strain: Medium Risk (12/21/2020)   Overall Financial Resource Strain (CARDIA)    Difficulty of Paying Living Expenses: Somewhat hard  Food Insecurity: No Food Insecurity (12/21/2020)   Hunger Vital Sign    Worried About Running Out of Food in the Last Year: Never true    Ran Out of Food in the Last Year: Never true  Transportation Needs: No Transportation Needs (12/21/2020)   PRAPARE - Hydrologist (Medical): No    Lack of Transportation (Non-Medical): No  Physical Activity: Inactive (12/21/2020)   Exercise Vital  Sign    Days of Exercise per Week: 0 days    Minutes of Exercise per Session: 0 min  Stress: No Stress Concern Present (12/21/2020)   [place]    Feeling of Stress : Only a little  Social Connections: Socially Isolated (12/21/2020)   Social Connection and Isolation Panel [NHANES]    Frequency of Communication with Friends and Family: Twice a week    Frequency of Social Gatherings with Friends and Family: Three times a week    Attends Religious Services: Never    Active Member of Clubs or Organizations: No    Attends Archivist Meetings: Never    Marital Status: Divorced    Allergies:  Allergies  Allergen Reactions   Wellbutrin [Bupropion] Nausea And Vomiting    Current Medications: Current Outpatient Medications  Medication Sig Dispense Refill   albuterol (PROAIR HFA) 108 (90 Base) MCG/ACT inhaler 2 puffs every 4 hours as needed only  if your can't catch your breath 18 g 11   allopurinol (ZYLOPRIM) 100 MG tablet TAKE 1 TABLET BY MOUTH TWICE A DAY 60 tablet 2   bisoprolol-hydrochlorothiazide (ZIAC) 5-6.25 MG tablet TAKE ONE TABLET BY MOUTH TWICE DAILY 60 tablet 2   blood glucose meter kit and supplies KIT Dispense based on patient and insurance preference. Use up to four times daily as directed. 1 each 0   Budeson-Glycopyrrol-Formoterol (BREZTRI AEROSPHERE) 160-9-4.8 MCG/ACT AERO Inhale 2 puffs into the lungs in the morning and at bedtime. 32.1 g 3   cloNIDine (CATAPRES) 0.1 MG tablet TAKE 1 TABLET BY MOUTH TWICE A DAY 60  tablet 1   Colchicine 0.6 MG CAPS Take 0.6 mg by mouth 2 (two) times daily as needed. 60 capsule 1   DULoxetine (CYMBALTA) 60 MG capsule Take 1 capsule (60 mg total) by mouth 2 (two) times daily. 60 capsule 1   gabapentin (NEURONTIN) 100 MG capsule TAKE TWO CAPSULES BY MOUTH three times daily 180 capsule 3   Glucose Blood (BLOOD GLUCOSE TEST STRIPS 333) STRP 1 strip by In Vitro route in the  morning and at bedtime. 200 strip 1   hydrOXYzine (ATARAX) 50 MG tablet Take 1 tablet (50 mg total) by mouth at bedtime. 30 tablet 1   Lancets MISC 1 Device by Does not apply route 2 (two) times daily. 200 each 1   omeprazole (PRILOSEC) 40 MG capsule Take 30- 60 min before your first and last meals of the day 60 capsule 11   predniSONE (DELTASONE) 10 MG tablet Take  4 each am x 2 days,   2 each am x 2 days,  1 each am x 2 days and stop 14 tablet 11   rosuvastatin (CRESTOR) 10 MG tablet TAKE ONE TABLET BY MOUTH EVERY EVENING 90 tablet 1   No current facility-administered medications for this visit.     Psychiatric Specialty Exam: Review of Systems  There were no vitals taken for this visit.There is no height or weight on file to calculate BMI.  General Appearance: Fairly Groomed  Eye Contact:  Good  Speech:  Clear and Coherent  Volume:  Normal  Mood:  Euthymic  Affect:  Congruent  Thought Process:  Coherent and Linear  Orientation:  Full (Time, Place, and Person)  Thought Content: Logical   Suicidal Thoughts:  No  Homicidal Thoughts:  No  Memory:  NA  Judgement:  Good  Insight:  Good  Psychomotor Activity:  Normal  Concentration:  Concentration: Good  Recall:  Good  Fund of Knowledge: Good  Language: Good  Akathisia:  NA    AIMS (if indicated): not done  Assets:  Desire for Improvement Housing Social Support  ADL's:  Intact  Cognition: WNL  Sleep:  Fair   Metabolic Disorder Labs: Lab Results  Component Value Date   HGBA1C 5.7 (A) 11/13/2021   No results found for: "PROLACTIN" Lab Results  Component Value Date   CHOL 184 05/11/2021   TRIG 238 (H) 05/11/2021   HDL 58 05/11/2021   CHOLHDL 3.2 05/11/2021   VLDL 51.0 (H) 05/09/2020   LDLCALC 87 05/11/2021   LDLCALC 48 11/15/2020   Lab Results  Component Value Date   TSH 1.21 11/13/2021   TSH 3.06 05/09/2020    Therapeutic Level Labs: No results found for: "LITHIUM" No results found for: "VALPROATE" No  results found for: "CBMZ"   Screenings: PHQ2-9    [place] Office Visit from 11/13/2021 in [place] at [place] from 10/11/2021 in [facility] Office Visit from 08/16/2021 in [place] at [place] from 07/11/2021 in [place] at [place] from 12/21/2020 in [place] at [facility] Total Score _0 PHQ-9 Total Score _1 --       Collaboration of Care: Collaboration of Care: Medication Management AEB medication prescription  Patient/Guardian was advised Release of Information must be obtained prior to any record release in order to collaborate their care with an outside provider. Patient/Guardian was advised if they have not already done so to contact the registration department  to sign all necessary forms in order for [place] to release information regarding their care.   Consent: Patient/Guardian gives verbal consent for treatment and assignment of benefits for services provided during this visit. Patient/Guardian expressed understanding and agreed to proceed.    [name], MD 01/15/2022, 2:17 PM   Virtual Visit via Video Note  I connected with Samantha Hinton on 01/15/22 at  2:00 PM EST by a video enabled telemedicine application and verified that I am speaking with the correct person using two identifiers.  Location: Patient: Home Provider: Home Office   I discussed the limitations of evaluation and management by telemedicine and the availability of in person appointments. The patient expressed understanding and agreed to proceed.   I discussed the assessment and treatment plan with the patient. The patient was provided an opportunity to ask questions and all were answered. The patient agreed with the plan and demonstrated an understanding of the instructions.   The patient was advised to call back or seek an in-person evaluation if the  symptoms worsen or if the condition fails to improve as anticipated.  I provided 20 minutes of non-face-to-face time during this encounter.   [name], MD

## 2022-01-15 NOTE — Telephone Encounter (Signed)
Returned patients call  Patient returned my call 01/10/22

## 2022-01-24 ENCOUNTER — Encounter: Payer: Self-pay | Admitting: Family Medicine

## 2022-01-24 ENCOUNTER — Ambulatory Visit (INDEPENDENT_AMBULATORY_CARE_PROVIDER_SITE_OTHER): Payer: Medicare Other | Admitting: Family Medicine

## 2022-01-24 VITALS — BP 160/82 | HR 85 | Temp 98.1°F | Wt 200.2 lb

## 2022-01-24 DIAGNOSIS — I10 Essential (primary) hypertension: Secondary | ICD-10-CM

## 2022-01-24 DIAGNOSIS — N6341 Unspecified lump in right breast, subareolar: Secondary | ICD-10-CM | POA: Diagnosis not present

## 2022-01-24 DIAGNOSIS — N644 Mastodynia: Secondary | ICD-10-CM

## 2022-01-24 NOTE — Progress Notes (Deleted)
[facility] [place] Phone: [phone] Subjective:    I'm seeing this patient by the request  of:  Panosh, Standley Brooking, MD  CC:   [id number]  10/30/2021 Patient seems to be stable at this time.  Has not change medications of Cymbalta and is noted some improvement.  Patient is working on her psychology at the moment and is in a better living circumstance that she thinks is beneficial.  Does still have some radicular symptoms but is trying to titrate off of the gabapentinWe will monitor for worsening reflux sympathetic dystrophy.  Follow-up with me again in 3 months.      Update 01/30/2022 Samantha Hinton is a 66 y.o. female coming in with complaint of lumbar spine pain. Patient states   -     Past Medical History:  Diagnosis Date   Anxiety    Asthma    Chronic back pain    buldging disc and tumor.Spondylolisthesis   COPD (chronic obstructive pulmonary disease) (HCC)    Symbicort daily and ProAir as needed   Depression    Emphysema    Emphysema of lung ([place])    Fatty liver 04/13/2019   Fatty liver    Fibromyalgia    Gallbladder problem    Genital warts    GERD (gastroesophageal reflux disease)    takes Omeprazole daily   Heartburn    History of bronchitis 01/2014   History of colon polyps    Hyperlipidemia    has been off of meds d/t getting sick from them.Not been addressed again.    Hypertension    takes Ziac daily   Nocturia    Pneumonia    hx of-8+yrs ago   PONV (postoperative nausea and vomiting)    Shortness of breath dyspnea    thinks d/t pain meds and notices with exertion   SOBOE (shortness of breath on exertion)    Stroke ([place])    16 yrs ago--left eye    Swallowing difficulty    Past Surgical History:  Procedure Laterality Date   ABDOMINAL HYSTERECTOMY  1983   partial    ADENOIDECTOMY     ANTERIOR (CYSTOCELE) AND POSTERIOR REPAIR (RECTOCELE) WITH XENFORM GRAFT AND SACROSPINOUS  FIXATION     APPENDECTOMY  1967   BACK SURGERY  2017   lower back   BREAST SURGERY Right    diseased milk glands   CHOLECYSTECTOMY  1983   COLONOSCOPY  04/13/2019   DIAGNOSTIC LAPAROSCOPY     cyst removed from ovaries    ESOPHAGOGASTRODUODENOSCOPY     lypoma  2011   NECK SURGERY  [phone]   c spine ant and post hx fusion and removal or harcdware and shavings   POLYPECTOMY     rods and screws in lumbar     TONSILLECTOMY     UPPER GASTROINTESTINAL ENDOSCOPY  04/13/2019   Social History   Socioeconomic History   Marital status: Significant Other    Spouse name: [name]   Number of children: 2   Years of education: Not on file   Highest education level: Not on file  Occupational History   Occupation: retired    Fish farm manager: [name]  Tobacco Use   Smoking status: Former    Packs/day: 0.25    Years: 44.00    Total pack years: 11.00    Types: Cigarettes    Quit date: 02/04/2014    Years since quitting: 7.9   Smokeless tobacco: Never  Tobacco comments:    quit smoking in Dec 2015  Vaping Use   Vaping Use: Former  Substance and Sexual Activity   Alcohol use: Yes    Alcohol/week: 4.0 standard drinks of alcohol    Types: 4 Standard drinks or equivalent per week    Comment: couple times a week   Drug use: No   Sexual activity: Not Currently    Birth control/protection: Surgical    Comment: 1st intercourse 66 yo-Fewer than 5 partners  Other Topics Concern   Not on file  Social History Narrative   6-8 hours of sleep per night   Lives with her fiance hh of 2 no pets    1 dog in the home   Retired no ets  But tob 8 per day   etoh ocass 1-2    On disability from her neck surgery predicaments .   orig from [employer] in [employer] [place] area.    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 2   Last Pap 2006 last mammogram 2000 and   Has dentures   FA    Social Determinants of Health   Financial Resource Strain: Medium Risk (12/21/2020)   Overall  Financial Resource Strain (CARDIA)    Difficulty of Paying Living Expenses: Somewhat hard  Food Insecurity: No Food Insecurity (12/21/2020)   Hunger Vital Sign    Worried About Running Out of Food in the Last Year: Never true    Ran Out of Food in the Last Year: Never true  Transportation Needs: No Transportation Needs (12/21/2020)   PRAPARE - Hydrologist (Medical): No    Lack of Transportation (Non-Medical): No  Physical Activity: Inactive (12/21/2020)   Exercise Vital Sign    Days of Exercise per Week: 0 days    Minutes of Exercise per Session: 0 min  Stress: No Stress Concern Present (12/21/2020)   [place]    Feeling of Stress : Only a little  Social Connections: Socially Isolated (12/21/2020)   Social Connection and Isolation Panel [NHANES]    Frequency of Communication with Friends and Family: Twice a week    Frequency of Social Gatherings with Friends and Family: Three times a week    Attends Religious Services: Never    Active Member of Clubs or Organizations: No    Attends Music therapist: Never    Marital Status: Divorced   Allergies  Allergen Reactions   Wellbutrin [Bupropion] Nausea And Vomiting   Family History  Problem Relation Age of Onset   CAD Father        tumor between heart and lung    Hypertension Father    Cancer Father    CAD Sister        ? dx    Hypertension Mother    Lung cancer Mother        died 80    Diabetes Maternal Grandmother        late 14s    Colon cancer Neg Hx    Colon polyps Neg Hx    Esophageal cancer Neg Hx    Rectal cancer Neg Hx    Stomach cancer Neg Hx     Current Outpatient Medications (Endocrine & Metabolic):    predniSONE (DELTASONE) 10 MG tablet, Take  4 each am x 2 days,   2 each am x 2 days,  1 each am x 2 days and  stop  Current Outpatient Medications (Cardiovascular):    bisoprolol-hydrochlorothiazide (ZIAC)  5-6.25 MG tablet, TAKE ONE TABLET BY MOUTH TWICE DAILY   cloNIDine (CATAPRES) 0.1 MG tablet, TAKE 1 TABLET BY MOUTH TWICE A DAY   rosuvastatin (CRESTOR) 10 MG tablet, TAKE ONE TABLET BY MOUTH EVERY EVENING  Current Outpatient Medications (Respiratory):    albuterol (PROAIR HFA) 108 (90 Base) MCG/ACT inhaler, 2 puffs every 4 hours as needed only  if your can't catch your breath   Budeson-Glycopyrrol-Formoterol (BREZTRI AEROSPHERE) 160-9-4.8 MCG/ACT AERO, Inhale 2 puffs into the lungs in the morning and at bedtime.  Current Outpatient Medications (Analgesics):    allopurinol (ZYLOPRIM) 100 MG tablet, TAKE 1 TABLET BY MOUTH TWICE A DAY   Colchicine 0.6 MG CAPS, Take 0.6 mg by mouth 2 (two) times daily as needed.   Current Outpatient Medications (Other):    blood glucose meter kit and supplies KIT, Dispense based on patient and insurance preference. Use up to four times daily as directed.   DULoxetine (CYMBALTA) 60 MG capsule, Take 1 capsule (60 mg total) by mouth 2 (two) times daily.   Glucose Blood (BLOOD GLUCOSE TEST STRIPS 333) STRP, 1 strip by In Vitro route in the morning and at bedtime.   hydrOXYzine (ATARAX) 50 MG tablet, Take 1 tablet (50 mg total) by mouth at bedtime.   Lancets MISC, 1 Device by Does not apply route 2 (two) times daily.   mirtazapine (REMERON SOL-TAB) 15 MG disintegrating tablet, Take 0.5 tablets (7.5 mg total) by mouth at bedtime.   omeprazole (PRILOSEC) 40 MG capsule, Take 30- 60 min before your first and last meals of the day   Reviewed prior external information including notes and imaging from  primary care provider As well as notes that were available from care everywhere and other healthcare systems.  Past medical history, social, surgical and family history all reviewed in electronic medical record.  No pertanent information unless stated regarding to the chief complaint.   Review of Systems:  No headache, visual changes, nausea, vomiting, diarrhea,  constipation, dizziness, abdominal pain, skin rash, fevers, chills, night sweats, weight loss, swollen lymph nodes, body aches, joint swelling, chest pain, shortness of breath, mood changes. POSITIVE muscle aches  Objective  There were no vitals taken for this visit.   General: No apparent distress alert and oriented x3 mood and affect normal, dressed appropriately.  HEENT: Pupils equal, extraocular movements intact  Respiratory: Patient's speak in full sentences and does not appear short of breath  Cardiovascular: No lower extremity edema, non tender, no erythema      Impression and Recommendations:

## 2022-01-24 NOTE — Progress Notes (Signed)
Subjective:    Patient ID: Samantha Hinton, female    DOB: 15-May-1955, 66 y.o.   MRN: [id number]  Chief Complaint  Patient presents with   Mass    Pt reports she noticed lump behind right breast 3 wks ago. Denied any pain or discharge. Added she had a surgery on that breast years ago.     HPI Patient is a 66yo female followed by Dr. Regis Hinton and seen today for acute concern.  Pt with R breast mass behind nipple x 3 weeks.  Patient states she noticed the area on self-exam.  Endorses right breast "feels off".  Has not noticed any drainage, erythema, or skin changes.  Patient endorses history of fibro cystic breast tissue tried to schedule mammogram was advised to see PCP.  Last mammogram was several years ago.  Patient endorses history of right breast milk duct removal 20 years ago in [place].  Patient had a maternal aunt with breast cancer.  Past Medical History:  Diagnosis Date   Anxiety    Asthma    Chronic back pain    buldging disc and tumor.Spondylolisthesis   COPD (chronic obstructive pulmonary disease) (HCC)    Symbicort daily and ProAir as needed   Depression    Emphysema    Emphysema of lung ([place])    Fatty liver 04/13/2019   Fatty liver    Fibromyalgia    Gallbladder problem    Genital warts    GERD (gastroesophageal reflux disease)    takes Omeprazole daily   Heartburn    History of bronchitis 01/2014   History of colon polyps    Hyperlipidemia    has been off of meds d/t getting sick from them.Not been addressed again.    Hypertension    takes Ziac daily   Nocturia    Pneumonia    hx of-8+yrs ago   PONV (postoperative nausea and vomiting)    Shortness of breath dyspnea    thinks d/t pain meds and notices with exertion   SOBOE (shortness of breath on exertion)    Stroke (HCC)    16 yrs ago--left eye    Swallowing difficulty     Allergies  Allergen Reactions   Wellbutrin [Bupropion] Nausea And Vomiting    ROS General: Denies fever, chills,  night sweats, changes in weight, changes in appetite HEENT: Denies headaches, ear pain, changes in vision, rhinorrhea, sore throat CV: Denies CP, palpitations, SOB, orthopnea Pulm: Denies SOB, cough, wheezing GI: Denies abdominal pain, nausea, vomiting, diarrhea, constipation GU: Denies dysuria, hematuria, frequency, vaginal discharge + right breast lump Msk: Denies muscle cramps, joint pains Neuro: Denies weakness, numbness, tingling Skin: Denies rashes, bruising Psych: Denies depression, anxiety, hallucinations    Objective:    Blood pressure (!) 146/80, pulse 85, temperature 98.1 F (36.7 C), temperature source Oral, weight 200 lb 3.2 oz (90.8 kg), SpO2 95 %.  Gen. Pleasant, well-nourished, in no distress, normal affect   HEENT: [place]/AT, face symmetric, conjunctiva clear, no scleral icterus, PERRLA, EOMI, nares patent without drainage, pharynx without erythema or exudate. Neck: No JVD, no thyromegaly, no carotid bruits Lungs: no accessory muscle use, CTAB, no wheezes or rales Cardiovascular: RRR, no m/r/g, no peripheral edema Breast: Normal-appearing moderate-large pendulous breasts.  Right nipple appears dry with mild flaking of skin.  Subclavicular and R axillary TTP. 8 mm round mobile nodule in LUQ of right breast with mild tenderness.  No TTP of left breast round mobile nodule in left breast left lower quadrant without  tenderness.  No drainage, peau to orange, erythema. Musculoskeletal: No deformities, no cyanosis or clubbing, normal tone Neuro:  A&Ox3, CN II-XII intact, normal gait Skin:  Warm, no lesions/ rash   Wt Readings from Last 3 Encounters:  01/24/22 200 lb 3.2 oz (90.8 kg)  12/08/21 208 lb (94.3 kg)  11/13/21 205 lb 3.2 oz (93.1 kg)    Lab Results  Component Value Date   WBC 9.0 11/13/2021   HGB 13.0 11/13/2021   HCT 39.6 11/13/2021   PLT 215.0 11/13/2021   GLUCOSE 118 (H) 11/13/2021   CHOL 184 05/11/2021   TRIG 238 (H) 05/11/2021   HDL 58 05/11/2021    LDLDIRECT 242.0 05/09/2020   LDLCALC 87 05/11/2021   ALT 15 05/11/2021   AST 21 05/11/2021   NA 140 11/13/2021   K 3.9 11/13/2021   CL 93 (L) 11/13/2021   CREATININE 0.86 11/13/2021   BUN 14 11/13/2021   CO2 34 (H) 11/13/2021   TSH 1.21 11/13/2021   HGBA1C 5.7 (A) 11/13/2021   MICROALBUR 5.2 (H) 05/09/2020    Assessment/Plan:  Subareolar mass of right breast -New problem -Order placed for diagnostic for evaluation of subareolar mass present x 3 weeks - Plan: MM Digital Diagnostic Bilat  Soreness breast - Plan: MM Digital Diagnostic Bilat  Essential hypertension -Elevated likely 2/2 anxiety -Continue current medication bisoprolol-hydrochlorothiazide 5-6.25 mg twice daily, clonidine 0.1 mg  F/u with PCP in the next few weeks for HTN  Grier Mitts, MD

## 2022-01-26 ENCOUNTER — Other Ambulatory Visit: Payer: Self-pay | Admitting: Family Medicine

## 2022-01-26 DIAGNOSIS — N644 Mastodynia: Secondary | ICD-10-CM

## 2022-01-26 DIAGNOSIS — N6341 Unspecified lump in right breast, subareolar: Secondary | ICD-10-CM

## 2022-01-30 ENCOUNTER — Ambulatory Visit: Payer: Medicare Other | Admitting: Family Medicine

## 2022-01-31 ENCOUNTER — Other Ambulatory Visit: Payer: Self-pay | Admitting: *Deleted

## 2022-01-31 DIAGNOSIS — Z122 Encounter for screening for malignant neoplasm of respiratory organs: Secondary | ICD-10-CM

## 2022-01-31 DIAGNOSIS — Z87891 Personal history of nicotine dependence: Secondary | ICD-10-CM

## 2022-02-08 ENCOUNTER — Other Ambulatory Visit: Payer: Self-pay | Admitting: Family Medicine

## 2022-02-08 ENCOUNTER — Ambulatory Visit
Admission: RE | Admit: 2022-02-08 | Discharge: 2022-02-08 | Disposition: A | Discharge status: Home / Self Care | Payer: Medicare Other | Source: Ambulatory Visit | Attending: Family Medicine | Admitting: Family Medicine

## 2022-02-08 DIAGNOSIS — N63 Unspecified lump in unspecified breast: Secondary | ICD-10-CM

## 2022-02-08 DIAGNOSIS — N6311 Unspecified lump in the right breast, upper outer quadrant: Secondary | ICD-10-CM | POA: Diagnosis not present

## 2022-02-08 DIAGNOSIS — N644 Mastodynia: Secondary | ICD-10-CM

## 2022-02-08 DIAGNOSIS — N6341 Unspecified lump in right breast, subareolar: Secondary | ICD-10-CM

## 2022-02-08 DIAGNOSIS — R928 Other abnormal and inconclusive findings on diagnostic imaging of breast: Secondary | ICD-10-CM | POA: Diagnosis not present

## 2022-02-26 ENCOUNTER — Other Ambulatory Visit: Payer: Self-pay | Admitting: Internal Medicine

## 2022-02-27 ENCOUNTER — Telehealth: Payer: Self-pay | Admitting: Internal Medicine

## 2022-02-27 NOTE — Telephone Encounter (Signed)
Left message for patient to call back and schedule Medicare Annual Wellness Visit (AWV) either virtually or in office. Left  my Herbie Drape number [phone]   Last AWV 12/21/20 please schedule with Nurse Health Adviser   45 min for awv-i  in office appointments 30 min for awv in office/ phone/virtual appointments

## 2022-03-16 ENCOUNTER — Telehealth (HOSPITAL_COMMUNITY): Payer: Medicare Other | Admitting: Psychiatry

## 2022-03-19 ENCOUNTER — Ambulatory Visit (INDEPENDENT_AMBULATORY_CARE_PROVIDER_SITE_OTHER): Payer: Medicare Other | Admitting: Primary Care

## 2022-03-19 ENCOUNTER — Encounter: Payer: Self-pay | Admitting: Primary Care

## 2022-03-19 DIAGNOSIS — Z87891 Personal history of nicotine dependence: Secondary | ICD-10-CM

## 2022-03-19 NOTE — Progress Notes (Signed)
Virtual Visit via Telephone Note  I connected with Samantha Hinton on 03/19/22 at  8:30 AM EST by telephone and verified that I am speaking with the correct person using two identifiers.  Location: Patient: Home Provider: Office   I discussed the limitations, risks, security and privacy concerns of performing an evaluation and management service by telephone and the availability of in person appointments. I also discussed with the patient that there may be a patient responsible charge related to this service. The patient expressed understanding and agreed to proceed.   Shared Decision Making Visit Lung Cancer Screening Program [phone])   Eligibility: Age 67 y.o.  Pack Years Smoking History Calculation 55 (# packs/per year x # years smoked) Recent History of coughing up blood  no Unexplained weight loss? no ( >Than 15 pounds within the last 6 months ) Prior History Lung / other cancer no (Diagnosis within the last 5 years already requiring surveillance chest CT Scans). Smoking Status Former Smoker Former Smokers: Years since quit: Na  Quit Date: 2015  Visit Components: Discussion included one or more decision making aids. yes Discussion included risk/benefits of screening. yes Discussion included potential follow up diagnostic testing for abnormal scans. yes Discussion included meaning and risk of over diagnosis. yes Discussion included meaning and risk of False Positives. yes Discussion included meaning of total radiation exposure. yes  Counseling Included: Importance of adherence to annual lung cancer LDCT screening. yes Impact of comorbidities on ability to participate in the program. yes Ability and willingness to under diagnostic treatment. yes  Smoking Cessation Counseling: Current Smokers:  Discussed importance of smoking cessation. yes Information about tobacco cessation classes and interventions provided to patient. yes Patient provided with "ticket" for LDCT  Scan. Na Symptomatic Patient. no  Counseling(Intermediate counseling: > three minutes) 99406 Diagnosis Code: Tobacco Use Z72.0 Asymptomatic Patient yes  Counseling (Intermediate counseling: > three minutes counseling) B1517 Former Smokers:  Discussed the importance of maintaining cigarette abstinence. yes Diagnosis Code: Personal History of Nicotine Dependence. O16.073 Information about tobacco cessation classes and interventions provided to patient. Yes Patient provided with "ticket" for LDCT Scan. NA Written Order for Lung Cancer Screening with LDCT placed in Epic. Yes (CT Chest Lung Cancer Screening Low Dose W/O CM) [id number] Z12.2-Screening of respiratory organs Z87.891-Personal history of nicotine dependence   I have spent 25 minutes of face to face/ virtual visit   time with Ms Samantha Hinton discussing the risks and benefits of lung cancer screening. We viewed / discussed a power point together that explained in detail the above noted topics. We paused at intervals to allow for questions to be asked and answered to ensure understanding.We discussed that the single most powerful action that she can take to decrease her risk of developing lung cancer is to quit smoking. We discussed whether or not she is ready to commit to setting a quit date. We discussed options for tools to aid in quitting smoking including nicotine replacement therapy, non-nicotine medications, support groups, Quit Smart classes, and behavior modification. We discussed that often times setting smaller, more achievable goals, such as eliminating 1 cigarette a day for a week and then 2 cigarettes a day for a week can be helpful in slowly decreasing the number of cigarettes smoked. This allows for a sense of accomplishment as well as providing a clinical benefit. I provided her with smoking cessation  information  with contact information for community resources, classes, free nicotine replacement therapy, and access to mobile apps,  text messaging, and  on-line smoking cessation help. I have also provided her the office contact information in the event she needs to contact me, or the screening staff. We discussed the time and location of the scan, and that either Doroteo Glassman RN, Joella Prince, RN  or I will call / send a letter with the results within 24-72 hours of receiving them. The patient verbalized understanding of all of  the above and had no further questions upon leaving the office. They have my contact information in the event they have any further questions.  I spent 3-5 minutes counseling on smoking cessation and the health risks of continued tobacco abuse.  I explained to the patient that there has been a high incidence of coronary artery disease noted on these exams. I explained that this is a non-gated exam therefore degree or severity cannot be determined. This patient is on statin therapy. I have asked the patient to follow-up with their PCP regarding any incidental finding of coronary artery disease and management with diet or medication as their PCP  feels is clinically indicated. The patient verbalized understanding of the above and had no further questions upon completion of the visit.     [name], NP

## 2022-03-19 NOTE — Patient Instructions (Signed)
Thank you for participating in the [facility] Lung Cancer Screening Program. It was our pleasure to meet you today. We will call you with the results of your scan within the next few days. Your scan will be assigned a Lung RADS category score by the physicians reading the scans.  This Lung RADS score determines follow up scanning.  See below for description of categories, and follow up screening recommendations. We will be in touch to schedule your follow up screening annually or based on recommendations of our providers. We will fax a copy of your scan results to your Primary Care Physician, or the physician who referred you to the program, to ensure they have the results. Please call the office if you have any questions or concerns regarding your scanning experience or results.  Our office number is [phone]. Please speak with Denise Phelps, RN. , or  Denise Buckner RN, They are  our Lung Cancer Screening RN.'s If They are unavailable when you call, Please leave a message on the voice mail. We will return your call at our earliest convenience.This voice mail is monitored several times a day.  Remember, if your scan is normal, we will scan you annually as long as you continue to meet the criteria for the program. (Age 50-80, Current smoker or smoker who has quit within the last 15 years). If you are a smoker, remember, quitting is the single most powerful action that you can take to decrease your risk of lung cancer and other pulmonary, breathing related problems. We know quitting is hard, and we are here to help.  Please let us know if there is anything we can do to help you meet your goal of quitting. If you are a former smoker, congratulations. We are proud of you! Remain smoke free! Remember you can refer friends or family members through the number above.  We will screen them to make sure they meet criteria for the program. Thank you for helping us take better care of you by  participating in Lung Screening.  You can receive free nicotine replacement therapy ( patches, gum or mints) by calling 1-800-QUIT NOW. Please call so we can get you on the path to becoming  a non-smoker. I know it is hard, but you can do this!  Lung RADS Categories:  Lung RADS 1: no nodules or definitely non-concerning nodules.  Recommendation is for a repeat annual scan in 12 months.  Lung RADS 2:  nodules that are non-concerning in appearance and behavior with a very low likelihood of becoming an active cancer. Recommendation is for a repeat annual scan in 12 months.  Lung RADS 3: nodules that are probably non-concerning , includes nodules with a low likelihood of becoming an active cancer.  Recommendation is for a 6-month repeat screening scan. Often noted after an upper respiratory illness. We will be in touch to make sure you have no questions, and to schedule your 6-month scan.  Lung RADS 4 A: nodules with concerning findings, recommendation is most often for a follow up scan in 3 months or additional testing based on our provider's assessment of the scan. We will be in touch to make sure you have no questions and to schedule the recommended 3 month follow up scan.  Lung RADS 4 B:  indicates findings that are concerning. We will be in touch with you to schedule additional diagnostic testing based on our provider's  assessment of the scan.  Other options for assistance in smoking cessation (   As covered by your insurance benefits)  Hypnosis for smoking cessation  [employer] [phone]  Acupuncture for smoking cessation  [facility] [phone]   

## 2022-03-20 ENCOUNTER — Encounter (HOSPITAL_COMMUNITY): Payer: Self-pay | Admitting: Psychiatry

## 2022-03-20 ENCOUNTER — Telehealth (HOSPITAL_BASED_OUTPATIENT_CLINIC_OR_DEPARTMENT_OTHER): Payer: Medicare Other | Admitting: Psychiatry

## 2022-03-20 ENCOUNTER — Telehealth (HOSPITAL_COMMUNITY): Payer: Medicare Other | Admitting: Psychiatry

## 2022-03-20 DIAGNOSIS — F331 Major depressive disorder, recurrent, moderate: Secondary | ICD-10-CM

## 2022-03-20 DIAGNOSIS — F4321 Adjustment disorder with depressed mood: Secondary | ICD-10-CM

## 2022-03-20 MED ORDER — HYDROXYZINE HCL 50 MG PO TABS: 50.0000 mg | ORAL_TABLET | Freq: Every day | ORAL | 2 refills | Status: DC

## 2022-03-20 MED ORDER — DULOXETINE HCL 60 MG PO CPEP: 60.0000 mg | ORAL_CAPSULE | Freq: Two times a day (BID) | ORAL | 0 refills | Status: DC

## 2022-03-20 NOTE — Progress Notes (Signed)
[facility] MD/PA/NP OP Progress Note  03/20/2022 9:03 AM Samantha Hinton  MRN:  [id number]  Visit Diagnosis:    ICD-10-CM   1. Moderate episode of recurrent major depressive disorder (HCC)  F33.1     2. Grief reaction  F43.21       Assessment: Patient is a 67 year old single female with a past psychiatric history of MDD, past trauma, and one prior psychiatric hospitalization in the 1990's following a medication overdose. She presented to [facility] on 10/11/21 for initial evaluation of derepressed mood and increased alcohol use in the context of the passing of her partner of 14 years.   Patient endorsed neurovegetative symptoms of depression including hopelessness, anhedonia, amotivation, insomnia, guilt, increased appetite, racing thoughts, and grief response during initial evaluation. Patient had increased her alcohol use to  a couple beers a night as a coping mechanism, was cautioned on the dangers of alcohol use in depression, and encouraged to taper off her alcohol use. Patient has protective factors in her financial stability, and social support from her son and grandkids. Patient met criteria for grief response and MDD at time of her initial evaluation.    Samantha Hinton presents for follow-up evaluation. Today, 03/20/22, patient reports that her mood and sleep symptoms are stable on combination of Cymbalta and Atarax.  While mood is not at her baseline she does endorse a number of psychosocial stressors including filing bankruptcy, an upcoming move, and the passing of her partner earlier this year that are affecting it.  Patient notes she did not start mirtazapine due to anxiety about potential side effects.  Of note she has had a number of falls in the interim and we agreed that she should hold off on starting mirtazapine.  Patient will continue on her current regimen of Cymbalta and hydroxyzine and we will follow up in 6 to 8 weeks.   Plan: - Continue Cymbalta 60 mg BID  -  Discontinue Remeron 7.5 mg QHS due to concern of potential side effects - Continue Atarax 50 mg nightly as needed for insomnia - Continue clonidine 0.1 BID for BP management - Patient weaning off gabapentin now at 200 mg BID for pain (managed by Dr. Tamala Julian) - CBC, CMP, A1c, TSH, and glucose reviewed.  - Therapy Referral on 10/11/21 appointment scheduled for next month - Follow up in 6-8 weeks   Chief Complaint:  Chief Complaint  Patient presents with   Follow-up   HPI: Samantha Hinton presents reporting that the last couple months have been busy. She has had to deal with the lawyers as she is in the process of filing bankruptcy. She has decided that she may move from her to be closer to her son, as she has had a few falls lately. She thinks that she found a new place to stay that has cheaper rent and will be closer to her son, less then 5 minutes. This will hopefully be the last move until she can move onto his property. Samantha Hinton ended up getting a life alert after this just in case.  Mood wise patient reports that has been staying level which is all right.  She still feels like she is not quite at her baseline but it is manageable.  We discussed how she has a number of psychosocial stressors currently with filing bankruptcy, the upcoming move, and the passing of her partner.  With time and after moving to his events we expect her mood symptoms to improve and we will readdress  medication options at that time.  Patient did ask about over-the-counter dopamine and we recommended she hold off on starting it.  In regards to her current medications patient reports that she did not end up starting mirtazapine as she is afraid of potential side effects.  She continues to take just the hydroxyzine for sleep symptoms which is okay and she can live with just doing okay in regards to sleep.  Past Psychiatric History: Patient reports an extensive past psychiatric history starting from a traumatic upbringing. She was  started on antidepressant medications following a suicide attempt in the 90's, at which time she hospitalized on an inpatient psychiatric unit. Patient has tried Zoloft (negative side effects), Effexor (ineffective), Cymbalta, Wellbutrin, gabapentin, trazodone, doxepin, Atarax, and clonidine in the past.  Started Remeron on 01/15/2022.  Patient denies any alcohol use in the last several months.  Past Medical History:  Past Medical History:  Diagnosis Date   Anxiety    Asthma    Chronic back pain    buldging disc and tumor.Spondylolisthesis   COPD (chronic obstructive pulmonary disease) (HCC)    Symbicort daily and ProAir as needed   Depression    Emphysema    Emphysema of lung ([place])    Fatty liver 04/13/2019   Fatty liver    Fibromyalgia    Gallbladder problem    Genital warts    GERD (gastroesophageal reflux disease)    takes Omeprazole daily   Heartburn    History of bronchitis 01/2014   History of colon polyps    Hyperlipidemia    has been off of meds d/t getting sick from them.Not been addressed again.    Hypertension    takes Ziac daily   Nocturia    Pneumonia    hx of-8+yrs ago   PONV (postoperative nausea and vomiting)    Shortness of breath dyspnea    thinks d/t pain meds and notices with exertion   SOBOE (shortness of breath on exertion)    Stroke ([place])    16 yrs ago--left eye    Swallowing difficulty     Past Surgical History:  Procedure Laterality Date   ABDOMINAL HYSTERECTOMY  1983   partial    ADENOIDECTOMY     ANTERIOR (CYSTOCELE) AND POSTERIOR REPAIR (RECTOCELE) WITH XENFORM GRAFT AND SACROSPINOUS FIXATION     APPENDECTOMY  1967   BACK SURGERY  2017   lower back   BREAST SURGERY Right    diseased milk glands   CHOLECYSTECTOMY  1983   COLONOSCOPY  04/13/2019   DIAGNOSTIC LAPAROSCOPY     cyst removed from ovaries    ESOPHAGOGASTRODUODENOSCOPY     lypoma  2011   NECK SURGERY  [id number]   c spine ant and post hx fusion and removal or  harcdware and shavings   POLYPECTOMY     rods and screws in lumbar     TONSILLECTOMY     UPPER GASTROINTESTINAL ENDOSCOPY  04/13/2019    Family Psychiatric History: denies  Family History:  Family History  Problem Relation Age of Onset   CAD Father        tumor between heart and lung    Hypertension Father    Cancer Father    CAD Sister        ? dx    Hypertension Mother    Lung cancer Mother        died 11    Diabetes Maternal Grandmother        late 9s  Colon cancer Neg Hx    Colon polyps Neg Hx    Esophageal cancer Neg Hx    Rectal cancer Neg Hx    Stomach cancer Neg Hx     Social History:  Social History   Socioeconomic History   Marital status: Significant Other    Spouse name: [name]   Number of children: 2   Years of education: Not on file   Highest education level: Not on file  Occupational History   Occupation: retired    Fish farm manager: Scientist, research (physical sciences)  Tobacco Use   Smoking status: Former    Packs/day: 0.25    Years: 44.00    Total pack years: 11.00    Types: Cigarettes    Quit date: 02/04/2014    Years since quitting: 8.1   Smokeless tobacco: Never   Tobacco comments:    quit smoking in Dec 2015  Vaping Use   Vaping Use: Former  Substance and Sexual Activity   Alcohol use: Yes    Alcohol/week: 4.0 standard drinks of alcohol    Types: 4 Standard drinks or equivalent per week    Comment: couple times a week   Drug use: No   Sexual activity: Not Currently    Birth control/protection: Surgical    Comment: 1st intercourse 67 yo-Fewer than 5 partners  Other Topics Concern   Not on file  Social History Narrative   6-8 hours of sleep per night   Lives with her fiance hh of 2 no pets    1 dog in the home   Retired no ets  But tob 8 per day   etoh ocass 1-2    On disability from her neck surgery predicaments .   orig from [employer] in [place] area.    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 2   Last Pap 2006 last  mammogram 2000 and   Has dentures   FA    Social Determinants of Health   Financial Resource Strain: Medium Risk (12/21/2020)   Overall Financial Resource Strain (CARDIA)    Difficulty of Paying Living Expenses: Somewhat hard  Food Insecurity: No Food Insecurity (12/21/2020)   Hunger Vital Sign    Worried About Running Out of Food in the Last Year: Never true    Ran Out of Food in the Last Year: Never true  Transportation Needs: No Transportation Needs (12/21/2020)   PRAPARE - Hydrologist (Medical): No    Lack of Transportation (Non-Medical): No  Physical Activity: Inactive (12/21/2020)   Exercise Vital Sign    Days of Exercise per Week: 0 days    Minutes of Exercise per Session: 0 min  Stress: No Stress Concern Present (12/21/2020)   [place]    Feeling of Stress : Only a little  Social Connections: Socially Isolated (12/21/2020)   Social Connection and Isolation Panel [NHANES]    Frequency of Communication with Friends and Family: Twice a week    Frequency of Social Gatherings with Friends and Family: Three times a week    Attends Religious Services: Never    Active Member of Clubs or Organizations: No    Attends Archivist Meetings: Never    Marital Status: Divorced    Allergies:  Allergies  Allergen Reactions   Wellbutrin [Bupropion] Nausea And Vomiting    Current Medications: Current Outpatient Medications  Medication Sig Dispense Refill   albuterol (PROAIR  HFA) 108 (90 Base) MCG/ACT inhaler 2 puffs every 4 hours as needed only  if your can't catch your breath 18 g 11   allopurinol (ZYLOPRIM) 100 MG tablet TAKE 1 TABLET BY MOUTH TWICE A DAY 60 tablet 2   bisoprolol-hydrochlorothiazide (ZIAC) 5-6.25 MG tablet TAKE 1 TABLET BY MOUTH TWICE A DAY 60 tablet 3   blood glucose meter kit and supplies KIT Dispense based on patient and insurance preference. Use up to four times  daily as directed. 1 each 0   Budeson-Glycopyrrol-Formoterol (BREZTRI AEROSPHERE) 160-9-4.8 MCG/ACT AERO Inhale 2 puffs into the lungs in the morning and at bedtime. 32.1 g 3   cloNIDine (CATAPRES) 0.1 MG tablet TAKE 1 TABLET BY MOUTH TWICE A DAY 60 tablet 2   Colchicine 0.6 MG CAPS Take 0.6 mg by mouth 2 (two) times daily as needed. 60 capsule 1   DULoxetine (CYMBALTA) 60 MG capsule Take 1 capsule (60 mg total) by mouth 2 (two) times daily. 180 capsule 0   Glucose Blood (BLOOD GLUCOSE TEST STRIPS 333) STRP 1 strip by In Vitro route in the morning and at bedtime. 200 strip 1   hydrOXYzine (ATARAX) 50 MG tablet Take 1 tablet (50 mg total) by mouth at bedtime. 30 tablet 1   Lancets MISC 1 Device by Does not apply route 2 (two) times daily. 200 each 1   mirtazapine (REMERON SOL-TAB) 15 MG disintegrating tablet Take 0.5 tablets (7.5 mg total) by mouth at bedtime. 15 tablet 2   omeprazole (PRILOSEC) 40 MG capsule Take 30- 60 min before your first and last meals of the day 60 capsule 11   predniSONE (DELTASONE) 10 MG tablet Take  4 each am x 2 days,   2 each am x 2 days,  1 each am x 2 days and stop 14 tablet 11   rosuvastatin (CRESTOR) 10 MG tablet TAKE ONE TABLET BY MOUTH EVERY EVENING 90 tablet 1   No current facility-administered medications for this visit.     Psychiatric Specialty Exam: Review of Systems  There were no vitals taken for this visit.There is no height or weight on file to calculate BMI.  General Appearance: Fairly Groomed  Eye Contact:  Good  Speech:  Clear and Coherent  Volume:  Normal  Mood:  Euthymic  Affect:  Congruent  Thought Process:  Coherent and Linear  Orientation:  Full (Time, Place, and Person)  Thought Content: Logical   Suicidal Thoughts:  No  Homicidal Thoughts:  No  Memory:  NA  Judgement:  Good  Insight:  Good  Psychomotor Activity:  Normal  Concentration:  Concentration: Good  Recall:  Good  Fund of Knowledge: Good  Language: Good  Akathisia:  NA     AIMS (if indicated): not done  Assets:  Desire for Improvement Housing Social Support  ADL's:  Intact  Cognition: WNL  Sleep:  Fair   Metabolic Disorder Labs: Lab Results  Component Value Date   HGBA1C 5.7 (A) 11/13/2021   No results found for: "PROLACTIN" Lab Results  Component Value Date   CHOL 184 05/11/2021   TRIG 238 (H) 05/11/2021   HDL 58 05/11/2021   CHOLHDL 3.2 05/11/2021   VLDL 51.0 (H) 05/09/2020   LDLCALC 87 05/11/2021   LDLCALC 48 11/15/2020   Lab Results  Component Value Date   TSH 1.21 11/13/2021   TSH 3.06 05/09/2020    Therapeutic Level Labs: No results found for: "LITHIUM" No results found for: "VALPROATE" No results found for: "CBMZ"  Screenings: [facility] Visit from 11/13/2021 in [facility] from 10/11/2021 in [facility] Office Visit from 08/16/2021 in [facility] from 07/11/2021 in [facility] from 12/21/2020 in [facility]  PHQ-2 Total Score '1 5 4 6 1  '$ PHQ-9 Total Score '6 19 13 20 '$ --       Collaboration of Care: Collaboration of Care: Medication Management AEB medication prescription  Patient/Guardian was advised Release of Information must be obtained prior to any record release in order to collaborate their care with an outside provider. Patient/Guardian was advised if they have not already done so to contact the registration department to sign all necessary forms in order for [place] to release information regarding their care.   Consent: Patient/Guardian gives verbal consent for treatment and assignment of benefits for services provided during this visit. Patient/Guardian expressed understanding and agreed to proceed.    [name], MD 03/20/2022, 9:03 AM   Virtual Visit via Video Note  I connected  with Samantha Hinton on 03/20/22 at 11:00 AM EST by a video enabled telemedicine application and verified that I am speaking with the correct person using two identifiers.  Location: Patient: Home Provider: Home Office   I discussed the limitations of evaluation and management by telemedicine and the availability of in person appointments. The patient expressed understanding and agreed to proceed.   I discussed the assessment and treatment plan with the patient. The patient was provided an opportunity to ask questions and all were answered. The patient agreed with the plan and demonstrated an understanding of the instructions.   The patient was advised to call back or seek an in-person evaluation if the symptoms worsen or if the condition fails to improve as anticipated.  I provided 25 minutes of non-face-to-face time during this encounter.   [name], MD

## 2022-03-21 ENCOUNTER — Ambulatory Visit (HOSPITAL_BASED_OUTPATIENT_CLINIC_OR_DEPARTMENT_OTHER)
Admission: RE | Admit: 2022-03-21 | Discharge: 2022-03-21 | Disposition: A | Discharge status: Home / Self Care | Payer: Medicare Other | Source: Ambulatory Visit | Attending: Acute Care | Admitting: Acute Care

## 2022-03-21 DIAGNOSIS — Z122 Encounter for screening for malignant neoplasm of respiratory organs: Secondary | ICD-10-CM | POA: Diagnosis not present

## 2022-03-21 DIAGNOSIS — Z87891 Personal history of nicotine dependence: Secondary | ICD-10-CM | POA: Diagnosis not present

## 2022-03-22 ENCOUNTER — Other Ambulatory Visit: Payer: Self-pay | Admitting: Acute Care

## 2022-03-22 DIAGNOSIS — Z87891 Personal history of nicotine dependence: Secondary | ICD-10-CM

## 2022-03-22 DIAGNOSIS — Z122 Encounter for screening for malignant neoplasm of respiratory organs: Secondary | ICD-10-CM

## 2022-04-07 ENCOUNTER — Encounter (HOSPITAL_BASED_OUTPATIENT_CLINIC_OR_DEPARTMENT_OTHER): Payer: Self-pay

## 2022-04-07 ENCOUNTER — Emergency Department (HOSPITAL_BASED_OUTPATIENT_CLINIC_OR_DEPARTMENT_OTHER): Payer: Medicare Other | Admitting: Radiology

## 2022-04-07 ENCOUNTER — Emergency Department (HOSPITAL_BASED_OUTPATIENT_CLINIC_OR_DEPARTMENT_OTHER)
Admission: EM | Admit: 2022-04-07 | Discharge: 2022-04-07 | Discharge status: Against Medical Advice | Payer: Medicare Other | Attending: Emergency Medicine | Admitting: Emergency Medicine

## 2022-04-07 DIAGNOSIS — E876 Hypokalemia: Secondary | ICD-10-CM | POA: Diagnosis not present

## 2022-04-07 DIAGNOSIS — Z20822 Contact with and (suspected) exposure to covid-19: Secondary | ICD-10-CM | POA: Insufficient documentation

## 2022-04-07 DIAGNOSIS — N3000 Acute cystitis without hematuria: Secondary | ICD-10-CM | POA: Diagnosis not present

## 2022-04-07 DIAGNOSIS — Z7951 Long term (current) use of inhaled steroids: Secondary | ICD-10-CM | POA: Diagnosis not present

## 2022-04-07 DIAGNOSIS — J441 Chronic obstructive pulmonary disease with (acute) exacerbation: Secondary | ICD-10-CM | POA: Insufficient documentation

## 2022-04-07 DIAGNOSIS — R059 Cough, unspecified: Secondary | ICD-10-CM | POA: Diagnosis not present

## 2022-04-07 DIAGNOSIS — Z5321 Procedure and treatment not carried out due to patient leaving prior to being seen by health care provider: Secondary | ICD-10-CM

## 2022-04-07 LAB — COMPREHENSIVE METABOLIC PANEL
ALT: 10 U/L (ref 0–44)
AST: 14 U/L — ABNORMAL LOW (ref 15–41)
Albumin: 4.1 g/dL (ref 3.5–5.0)
Alkaline Phosphatase: 60 U/L (ref 38–126)
Anion gap: 12 (ref 5–15)
BUN: 12 mg/dL (ref 8–23)
CO2: 34 mmol/L — ABNORMAL HIGH (ref 22–32)
Calcium: 9.7 mg/dL (ref 8.9–10.3)
Chloride: 94 mmol/L — ABNORMAL LOW (ref 98–111)
Creatinine, Ser: 0.86 mg/dL (ref 0.44–1.00)
GFR, Estimated: >60 mL/min (ref >60)
Glucose, Bld: 136 mg/dL — ABNORMAL HIGH (ref 70–99)
Potassium: 2.9 mmol/L — ABNORMAL LOW (ref 3.5–5.1)
Sodium: 140 mmol/L (ref 135–145)
Total Bilirubin: 0.6 mg/dL (ref 0.3–1.2)
Total Protein: 7 g/dL (ref 6.5–8.1)

## 2022-04-07 LAB — URINALYSIS, W/ REFLEX TO CULTURE (INFECTION SUSPECTED)
Bilirubin Urine: NEGATIVE
Glucose, UA: NEGATIVE mg/dL
Hgb urine dipstick: NEGATIVE
Nitrite: NEGATIVE
Protein, ur: 30 mg/dL — AB
Specific Gravity, Urine: 1.037 — ABNORMAL HIGH (ref 1.005–1.030)
WBC, UA: >50 WBC/hpf (ref 0–5)
pH: 6 (ref 5.0–8.0)

## 2022-04-07 LAB — CBC WITH DIFFERENTIAL/PLATELET
Abs Immature Granulocytes: 0.09 10*3/uL — ABNORMAL HIGH (ref 0.00–0.07)
Basophils Absolute: 0 10*3/uL (ref 0.0–0.1)
Basophils Relative: 0 %
Eosinophils Absolute: 0.1 10*3/uL (ref 0.0–0.5)
Eosinophils Relative: 1 %
HCT: 41.2 % (ref 36.0–46.0)
Hemoglobin: 13 g/dL (ref 12.0–15.0)
Immature Granulocytes: 1 %
Lymphocytes Relative: 26 %
Lymphs Abs: 3.1 10*3/uL (ref 0.7–4.0)
MCH: 27.5 pg (ref 26.0–34.0)
MCHC: 31.6 g/dL (ref 30.0–36.0)
MCV: 87.3 fL (ref 80.0–100.0)
Monocytes Absolute: 1.1 10*3/uL — ABNORMAL HIGH (ref 0.1–1.0)
Monocytes Relative: 9 %
Neutro Abs: 7.8 10*3/uL — ABNORMAL HIGH (ref 1.7–7.7)
Neutrophils Relative %: 63 %
Platelets: 235 10*3/uL (ref 150–400)
RBC: 4.72 MIL/uL (ref 3.87–5.11)
RDW: 14.6 % (ref 11.5–15.5)
WBC: 12.3 10*3/uL — ABNORMAL HIGH (ref 4.0–10.5)
nRBC: 0 % (ref 0.0–0.2)

## 2022-04-07 LAB — RESP PANEL BY RT-PCR (RSV, FLU A&B, COVID)  RVPGX2
Influenza A by PCR: NEGATIVE
Influenza B by PCR: NEGATIVE
Resp Syncytial Virus by PCR: NEGATIVE
SARS Coronavirus 2 by RT PCR: NEGATIVE

## 2022-04-07 LAB — MAGNESIUM: Magnesium: 2 mg/dL (ref 1.7–2.4)

## 2022-04-07 LAB — LIPASE, BLOOD: Lipase: 34 U/L (ref 11–51)

## 2022-04-07 LAB — TROPONIN I (HIGH SENSITIVITY): Troponin I (High Sensitivity): 3 ng/L (ref <18)

## 2022-04-07 MED ORDER — PREDNISONE 20 MG PO TABS: 40.0000 mg | ORAL_TABLET | Freq: Every day | ORAL | 0 refills | Status: AC

## 2022-04-07 MED ORDER — IPRATROPIUM-ALBUTEROL 0.5-2.5 (3) MG/3ML IN SOLN
3.0000 mL | Freq: Once | RESPIRATORY_TRACT | Status: AC
  Administered 2022-04-07: 3 mL via RESPIRATORY_TRACT
  Filled 2022-04-07: qty 3

## 2022-04-07 MED ORDER — POTASSIUM CHLORIDE CRYS ER 20 MEQ PO TBCR
40.0000 meq | EXTENDED_RELEASE_TABLET | Freq: Once | ORAL | Status: AC
  Administered 2022-04-07: 40 meq via ORAL
  Filled 2022-04-07: qty 2

## 2022-04-07 MED ORDER — POTASSIUM CHLORIDE 10 MEQ/100ML IV SOLN
10.0000 meq | INTRAVENOUS | Status: AC
  Administered 2022-04-07 (×2): 10 meq via INTRAVENOUS
  Filled 2022-04-07: qty 100

## 2022-04-07 MED ORDER — AMOXICILLIN-POT CLAVULANATE 875-125 MG PO TABS: 1.0000 | ORAL_TABLET | Freq: Two times a day (BID) | ORAL | 0 refills | Status: DC

## 2022-04-07 MED ORDER — MAGNESIUM SULFATE 2 GM/50ML IV SOLN
2.0000 g | Freq: Once | INTRAVENOUS | Status: AC
  Administered 2022-04-07: 2 g via INTRAVENOUS
  Filled 2022-04-07: qty 50

## 2022-04-07 MED ORDER — METHYLPREDNISOLONE SODIUM SUCC 125 MG IJ SOLR
125.0000 mg | Freq: Once | INTRAMUSCULAR | Status: AC
  Administered 2022-04-07: 125 mg via INTRAVENOUS
  Filled 2022-04-07: qty 2

## 2022-04-07 MED ORDER — ALBUTEROL SULFATE (2.5 MG/3ML) 0.083% IN NEBU
2.5000 mg | INHALATION_SOLUTION | Freq: Once | RESPIRATORY_TRACT | Status: AC
  Administered 2022-04-07: 2.5 mg via RESPIRATORY_TRACT
  Filled 2022-04-07: qty 3

## 2022-04-07 NOTE — Discharge Instructions (Addendum)
Contact a doctor if: Your COPD symptoms get worse than normal. Get help right away if: You are short of breath and it gets worse, even when you are resting. You have trouble talking. You have chest pain. You cough up blood. You have a fever. You keep vomiting. You feel weak or you pass out (faint). You feel confused. You are not able to sleep because of your symptoms. You have trouble doing daily activities. These symptoms may be an emergency. Get help right away. Call your local emergency services (911 in the [place] Do not wait to see if the symptoms will go away. Do not drive yourself to the hospital.

## 2022-04-07 NOTE — ED Notes (Signed)
Ambulatory pulse ox checked on patient. Desaturation from 92 to 86 on room air noted. Pt reports, "I feel good, I want to go home". Respirations were equal, but labored. Skin warm and dry. Provider notified.

## 2022-04-07 NOTE — ED Notes (Addendum)
Pt to nurses station reports, "take this IV out. I'm going home". Pt redirected to room and reports, "she came and told me she wants to admit me, I'm not coming in the hospital. I want to go home." INT removed per pt request. Respirations are equal and nonlabored. Skin warm and dry. Pt is alert and oriented x4, ambulatory without assistance. Ovid Curd, PA-C notified, reports MD coming to speak with patient.

## 2022-04-07 NOTE — ED Provider Notes (Signed)
Samantha Hinton Note   CSN: [id number] Arrival date & time: 04/07/22  1400     History Chief Complaint  Patient presents with   URI    Samantha Hinton is a 67 y.o. female with history of COPD, emphysema presents the emergency room today for evaluation of cough and cold symptoms for the past 6 days.  Patient reports diffuse bodyaches, subjective fever, chills, some diarrhea, cough with yellow and green sputum, and some nausea.  She denies any vomiting or abdominal pain.    Patient also reports that she is having some chest pain and shortness of breath as well.  She reports that she is been out of nebulizer treatments.  She has not tried any over-the-counter cough or cold medication.  Additionally, she mentions having dysuria for the past 10 days.  She reports history of frequent UTIs.  Former smoker.  Denies any EtOH or illicit drug use.   URI Presenting symptoms: congestion, cough, fever and rhinorrhea   Presenting symptoms: no ear pain and no sore throat   Associated symptoms: myalgias   Associated symptoms: no arthralgias        Home Medications Prior to Admission medications   Medication Sig Start Date End Date Taking? Authorizing Hinton  albuterol (PROAIR HFA) 108 (90 Base) MCG/ACT inhaler 2 puffs every 4 hours as needed only  if your can't catch your breath 05/05/20   Tanda Rockers, MD  allopurinol (ZYLOPRIM) 100 MG tablet TAKE 1 TABLET BY MOUTH TWICE A DAY 01/15/22   Panosh, Standley Brooking, MD  bisoprolol-hydrochlorothiazide (ZIAC) 5-6.25 MG tablet TAKE 1 TABLET BY MOUTH TWICE A DAY 02/27/22   Panosh, Standley Brooking, MD  blood glucose meter kit and supplies KIT Dispense based on patient and insurance preference. Use up to four times daily as directed. 07/13/21   [place]mire, Joneen Boers, FNP  Budeson-Glycopyrrol-Formoterol (BREZTRI AEROSPHERE) 160-9-4.8 MCG/ACT AERO Inhale 2 puffs into the lungs in the morning and at bedtime. 12/08/21   Tanda Rockers, MD  cloNIDine (CATAPRES) 0.1 MG tablet TAKE 1 TABLET BY MOUTH TWICE A DAY 02/26/22   Panosh, Standley Brooking, MD  Colchicine 0.6 MG CAPS Take 0.6 mg by mouth 2 (two) times daily as needed. 08/11/19   Newt Minion, MD  DULoxetine (CYMBALTA) 60 MG capsule Take 1 capsule (60 mg total) by mouth 2 (two) times daily. 03/20/22 03/20/23  [name], MD  Glucose Blood (BLOOD GLUCOSE TEST STRIPS 333) STRP 1 strip by In Vitro route in the morning and at bedtime. 07/13/21   Whitmire, Joneen Boers, FNP  hydrOXYzine (ATARAX) 50 MG tablet Take 1 tablet (50 mg total) by mouth at bedtime. 03/20/22   [name], MD  Lancets MISC 1 Device by Does not apply route 2 (two) times daily. 07/13/21   Whitmire, Joneen Boers, FNP  omeprazole (PRILOSEC) 40 MG capsule Take 30- 60 min before your first and last meals of the day 12/08/21   Tanda Rockers, MD  predniSONE (DELTASONE) 10 MG tablet Take  4 each am x 2 days,   2 each am x 2 days,  1 each am x 2 days and stop 12/08/21   Tanda Rockers, MD  rosuvastatin (CRESTOR) 10 MG tablet TAKE ONE TABLET BY MOUTH EVERY EVENING 06/15/21   Panosh, Standley Brooking, MD      Allergies    Wellbutrin [bupropion]    Review of Systems   Review of Systems  Constitutional:  Positive for chills and fever.  HENT:  Positive for congestion and rhinorrhea. Negative for drooling, ear pain, sore throat and trouble swallowing.   Eyes:  Negative for photophobia, discharge and visual disturbance.  Respiratory:  Positive for cough. Negative for shortness of breath.   Cardiovascular:  Negative for chest pain and palpitations.  Gastrointestinal:  Positive for diarrhea and nausea. Negative for abdominal pain and vomiting.  Genitourinary:  Positive for dysuria. Negative for hematuria.  Musculoskeletal:  Positive for myalgias. Negative for arthralgias, back pain and joint swelling.  Skin:  Negative for color change and rash.  Neurological:  Negative for syncope and weakness.    Physical Exam Updated Vital Signs BP  (!) 152/90   Pulse 88   Temp 97.9 F (36.6 C)   Resp (!) 22   SpO2 100%  Physical Exam Vitals and nursing note reviewed.  Constitutional:      General: She is not in acute distress.    Appearance: Normal appearance. She is not ill-appearing or toxic-appearing.  HENT:     Head: Normocephalic and atraumatic.     Nose: Congestion present.     Mouth/Throat:     Mouth: Mucous membranes are moist.  Eyes:     General: No scleral icterus. Cardiovascular:     Rate and Rhythm: Normal rate and regular rhythm.  Pulmonary:     Effort: Pulmonary effort is normal.     Comments: Diffuse expiratory wheezing with prolonged expiratory phase in the bilateral lower lung bases.  Patient is between 92 to 95% on room air while in the room.  She is still to speak in full sentences with ease.  No accessory muscle use, nasal flaring, tripoding, or cyanosis present. Abdominal:     General: Bowel sounds are normal. There is no distension.     Palpations: Abdomen is soft.     Tenderness: There is no abdominal tenderness. There is no right CVA tenderness, left CVA tenderness, guarding or rebound.  Musculoskeletal:        General: No deformity.     Cervical back: Normal range of motion.  Skin:    General: Skin is warm and dry.  Neurological:     General: No focal deficit present.     Mental Status: She is alert. Mental status is at baseline.     ED Results / Procedures / Treatments   Labs (all labs ordered are listed, but only abnormal results are displayed) Labs Reviewed  CBC WITH DIFFERENTIAL/PLATELET - Abnormal; Notable for the following components:      Result Value   WBC 12.3 (*)    Neutro Abs 7.8 (*)    Monocytes Absolute 1.1 (*)    Abs Immature Granulocytes 0.09 (*)    All other components within normal limits  COMPREHENSIVE METABOLIC PANEL - Abnormal; Notable for the following components:   Potassium 2.9 (*)    Chloride 94 (*)    CO2 34 (*)    Glucose, Bld 136 (*)    AST 14 (*)    All  other components within normal limits  RESP PANEL BY RT-PCR (RSV, FLU A&B, COVID)  RVPGX2  LIPASE, BLOOD  TROPONIN I (HIGH SENSITIVITY)    EKG EKG Interpretation  Date/Time:  Saturday April 07 2022 14:10:15 EST Ventricular Rate:  85 PR Interval:  176 QRS Duration: 84 QT Interval:  396 QTC Calculation: 471 R Axis:   60 Text Interpretation: Normal sinus rhythm Nonspecific ST abnormality Abnormal ECG When compared with ECG of 11-Feb-2014  01:14, PREVIOUS ECG IS PRESENT No significant changes Confirmed by Octaviano Glow [phone]) on 04/07/2022 3:31:02 PM  Radiology DG Chest 2 View  Result Date: 04/07/2022 CLINICAL DATA:  cough h/o COPD EXAM: CHEST - 2 VIEW COMPARISON:  06/30/2019 FINDINGS: Cardiac silhouette is unremarkable. No pneumothorax or pleural effusion. The lungs are clear. Aorta is calcified. The visualized skeletal structures are unremarkable. IMPRESSION: No acute cardiopulmonary process. Electronically Signed   By: Sammie Bench M.D.   On: 04/07/2022 15:40    Procedures Procedures  Medications Ordered in ED Medications  methylPREDNISolone sodium succinate (SOLU-MEDROL) 125 mg/2 mL injection 125 mg (has no administration in time range)  potassium chloride SA (KLOR-CON M) CR tablet 40 mEq (has no administration in time range)  potassium chloride 10 mEq in 100 mL IVPB (has no administration in time range)  ipratropium-albuterol (DUONEB) 0.5-2.5 (3) MG/3ML nebulizer solution 3 mL (3 mLs Nebulization Given 04/07/22 1451)  albuterol (PROVENTIL) (2.5 MG/3ML) 0.083% nebulizer solution 2.5 mg (2.5 mg Nebulization Given 04/07/22 1451)  ipratropium-albuterol (DUONEB) 0.5-2.5 (3) MG/3ML nebulizer solution 3 mL (3 mLs Nebulization Given 04/07/22 1616)    ED Course/ Medical Decision Making/ A&P                           Medical Decision Making Amount and/or Complexity of Data Reviewed Labs: ordered. Radiology: ordered.  Risk Prescription drug management.   67 year old female  presents emerged from today for evaluation of cough and cold symptoms as well as some dysuria.  Differential diagnosis includes limited to UTI, cystitis, pyelonephritis, COVID, flu, RSV, bronchitis, pneumonia, COPD exacerbation, ACS.  Vital signs show mildly of a pressure 152/90, afebrile, no pulse rate, satting well on room air without increased work of breathing.  Physical exam as noted above.  Labs and imaging ordered.  I independently reviewed and interpreted the patient's labs.  Patient is negative for COVID, flu, RSV.  Troponin at 3 initially.  CBC does show slight leukocytosis at 12.3 with a left shift of 7.8.  No anemia.  Normal platelets.  Lipase within normal limits.  CMP does show new hypokalemia which has not been present previously.  Potassium at 2.9.  She does have a bicarb of 34 however it was 34 few months prior as well.  Glucose at 136.  Mildly decreased chloride 94.  Mildly decreased AST at 14 otherwise no electrolyte or LFT abnormalities.  Urinalysis shows hazy, concentrated urine with trace amount of ketones.  There is 30 protein.  She also has moderate amount leukocytes with greater than 50 white blood cells and many bacteria.  There is 6-10 squamous epithelial present.  Given her dysuria, I think that this is infection with some squames.  Because of this I am treating her for UTI and also sending in a culture.  Chest x-ray shows no acute cardiopulmonary process.  EKG reviewed interpreted by attending and shows no change since last tracing.  Patient has received Solu-Medrol, DuoNebs, albuterol, magnesium, p.o. potassium as well as IV potassium for her hypokalemia and wheezing.  Ambulatory pulse oximetry shows***.   9:07 PM Attempted to call the patient x3 without answer. Augmentin for her COPD exacerbation and UTI ordered as well as a prednisone burst.    Final Clinical Impression(s) / ED Diagnoses Final diagnoses:  None    Rx / DC Orders ED Discharge Orders     None

## 2022-04-07 NOTE — ED Notes (Signed)
RN to room to tell pt about MD coming to speak with her, pt reports, "I don't need him to come speak with me, I'm going home and that's that." Pt then left department without assistance. Armida Sans and charge RN aware.

## 2022-04-07 NOTE — ED Triage Notes (Signed)
Pt c/o generalized bodyaches, fever, diarrhea, pains, cough w associated CP onset Sunday. Hx COPD, emphysema, states she's had to use her home meds more frequently, rescue inhaler just PTA

## 2022-04-10 ENCOUNTER — Encounter: Payer: Self-pay | Admitting: Gastroenterology

## 2022-04-10 LAB — URINE CULTURE: Culture: >=100000 — AB

## 2022-04-11 ENCOUNTER — Telehealth: Payer: Self-pay

## 2022-04-11 ENCOUNTER — Telehealth (HOSPITAL_BASED_OUTPATIENT_CLINIC_OR_DEPARTMENT_OTHER): Payer: Self-pay

## 2022-04-11 NOTE — Telephone Encounter (Signed)
        Patient  visited [facility] on 04/07/2022  for URI.   Telephone encounter attempt :  1st  A HIPAA compliant voice message was left requesting a return call.  Instructed patient to call back at [phone].   [place] Resource Care Guide   ??[email or web address]  ?? [id number]   Website: [email or web address]  [email or web address]

## 2022-04-11 NOTE — Telephone Encounter (Signed)
Post ED Visit - Positive Culture Follow-up  Culture report reviewed by antimicrobial stewardship pharmacist: [place] Team [x]$  Mal Misty [place], [place].D. []$  Heide Guile, Pharm.D., BCPS AQ-ID []$  Parks Neptune, Pharm.D., BCPS []$  Alycia Rossetti, Pharm.D., BCPS []$  [place], Pharm.D., BCPS, AAHIVP []$  Legrand Como, Pharm.D., BCPS, AAHIVP []$  Salome Arnt, PharmD, BCPS []$  Johnnette Gourd, PharmD, BCPS []$  Hughes Better, PharmD, BCPS []$  Leeroy Cha, PharmD []$  Laqueta Linden, PharmD, BCPS []$  Albertina Parr, PharmD  [place] Team []$  Leodis Sias, PharmD []$  Lindell Spar, PharmD []$  Royetta Asal, PharmD []$  Graylin Shiver, Rph []$  Rema Fendt) Glennon Mac, PharmD []$  Arlyn Dunning, PharmD []$  Netta Cedars, PharmD []$  Dia Sitter, PharmD []$  Leone Haven, PharmD []$  Gretta Arab, PharmD []$  Theodis Shove, PharmD []$  Peggyann Juba, PharmD []$  Reuel Boom, PharmD   Positive urine culture Treated with Amoxicillin-Pot Clavulanate, organism sensitive to the same and no further patient follow-up is required at this time.  Glennon Hamilton 04/11/2022, 9:39 AM

## 2022-04-16 ENCOUNTER — Telehealth: Payer: Self-pay

## 2022-04-16 NOTE — Telephone Encounter (Signed)
        Patient  visited [facility] on 04/07/2022  for URI.   Telephone encounter attempt :  2nd  A HIPAA compliant voice message was left requesting a return call.  Instructed patient to call back at [phone].   [place] Resource Care Guide   ??[email or web address]  ?? [id number]   Website: [email or web address]  [email or web address]

## 2022-04-17 ENCOUNTER — Telehealth: Payer: Self-pay

## 2022-04-17 ENCOUNTER — Other Ambulatory Visit: Payer: Self-pay | Admitting: Internal Medicine

## 2022-04-17 NOTE — Telephone Encounter (Addendum)
        Patient  visited [facility] on 04/07/2022  for URI.   Telephone encounter attempt :  3rd  A HIPAA compliant voice message was left requesting a return call.  Instructed patient to call back at [phone].   [place] Resource Care Guide   ??[email or web address]  ?? [id number]   Website: [email or web address]  [email or web address]

## 2022-05-01 ENCOUNTER — Encounter (HOSPITAL_COMMUNITY): Payer: Self-pay | Admitting: Psychiatry

## 2022-05-01 ENCOUNTER — Telehealth (HOSPITAL_BASED_OUTPATIENT_CLINIC_OR_DEPARTMENT_OTHER): Payer: Medicare Other | Admitting: Psychiatry

## 2022-05-01 DIAGNOSIS — F331 Major depressive disorder, recurrent, moderate: Secondary | ICD-10-CM

## 2022-05-01 MED ORDER — DULOXETINE HCL 60 MG PO CPEP: 60.0000 mg | ORAL_CAPSULE | Freq: Two times a day (BID) | ORAL | 0 refills | Status: DC

## 2022-05-01 MED ORDER — HYDROXYZINE HCL 50 MG PO TABS: 50.0000 mg | ORAL_TABLET | Freq: Every day | ORAL | 1 refills | Status: DC

## 2022-05-01 NOTE — Progress Notes (Signed)
[facility] MD/PA/NP OP Progress Note  05/01/2022 9:29 AM Samantha Hinton  MRN:  [id number]  Visit Diagnosis:    ICD-10-CM   1. Moderate episode of recurrent major depressive disorder (HCC)  F33.1       Assessment: Patient is a 67 year old single female with a past psychiatric history of MDD, past trauma, and one prior psychiatric hospitalization in the 1990's following a medication overdose. She presented to [facility] behavioral health on 10/11/21 for initial evaluation of derepressed mood and increased alcohol use in the context of the passing of her partner of 14 years.   At initial evaluation patient endorsed neurovegetative symptoms of depression including hopelessness, anhedonia, amotivation, insomnia, guilt, increased appetite, racing thoughts, and grief response during initial evaluation. Patient had increased her alcohol use to  a couple beers a night as a coping mechanism, was cautioned on the dangers of alcohol use in depression, and patient has since discontinued her alcohol use.  Patient has protective factors in her financial stability, and social support from her son and grandkids. Patient met criteria for grief response and MDD at time of her initial evaluation.  With the grief response and improving after several months.  [name] presents for follow-up evaluation. Today, 05/01/22, patient reports that her mood and sleep symptoms remained stable on her current medication regimen.  As she takes care of all of her psychosocial stressors such as filing bankruptcy and financial concerns her overall stress and depression levels have improved.  Patient is still working on the housing side of things believes that he is staying is taking care of her symptoms will continue to improve.  We will continue on her current regimen and follow-up in 2 to 3 months.  Plan: - Continue Cymbalta 60 mg BID  - Continue Atarax 50 mg nightly as needed for insomnia - Continue clonidine 0.1 BID for BP  management - Patient weaning off gabapentin now at 200 mg BID for pain (managed by Dr. Tamala Julian) - CBC, CMP, A1c, TSH, and glucose reviewed.  - Therapy Referral on 10/11/21 appointment scheduled for next month - Follow up in 2-3 months   Chief Complaint:  Chief Complaint  Patient presents with   Follow-up   HPI: [name] presents reporting that the last couple months have been alright. She had a COPD exacerbation/UTI in the interim around a month ago. Getting better after taking steroids and antibiotics but still kind of out of it. Otherwise things are about the same. She is still at the apartment and has not moved yet. It is going to be a while until she can move in with her son as he has to build a septic system, run electric, and dig a well. The legal piece of things are taken care of. While her finances are tight her son has been helping, and she is no longer dealing with the significant debt after filing bankruptcy. As that was taken care of she notes some decrease in her stress. [name] feels that as more of the things she is worried about improve her stress/depression levels will also improve.  She is content with the medication regimen and denies any adverse side effects.  She would like to continue on her current regimen.  Past Psychiatric History: Patient reports an extensive past psychiatric history starting from a traumatic upbringing. She was started on antidepressant medications following a suicide attempt in the 90's, at which time she hospitalized on an inpatient psychiatric unit. Patient has tried Zoloft (negative side effects),  Effexor (ineffective), Cymbalta, Wellbutrin, gabapentin, trazodone, doxepin, Atarax, and clonidine in the past. Remeron discontinued due to concern of oversedation/potential fall/weight gain.   Patient denies any alcohol use in the last several months.  Past Medical History:  Past Medical History:  Diagnosis Date   Anxiety    Asthma    Chronic back pain     buldging disc and tumor.Spondylolisthesis   COPD (chronic obstructive pulmonary disease) (HCC)    Symbicort daily and ProAir as needed   Depression    Emphysema    Emphysema of lung ([place])    Fatty liver 04/13/2019   Fatty liver    Fibromyalgia    Gallbladder problem    Genital warts    GERD (gastroesophageal reflux disease)    takes Omeprazole daily   Heartburn    History of bronchitis 01/2014   History of colon polyps    Hyperlipidemia    has been off of meds d/t getting sick from them.Not been addressed again.    Hypertension    takes Ziac daily   Nocturia    Pneumonia    hx of-8+yrs ago   PONV (postoperative nausea and vomiting)    Shortness of breath dyspnea    thinks d/t pain meds and notices with exertion   SOBOE (shortness of breath on exertion)    Stroke ([place])    16 yrs ago--left eye    Swallowing difficulty     Past Surgical History:  Procedure Laterality Date   ABDOMINAL HYSTERECTOMY  1983   partial    ADENOIDECTOMY     ANTERIOR (CYSTOCELE) AND POSTERIOR REPAIR (RECTOCELE) WITH XENFORM GRAFT AND SACROSPINOUS FIXATION     APPENDECTOMY  1967   BACK SURGERY  2017   lower back   BREAST SURGERY Right    diseased milk glands   CHOLECYSTECTOMY  1983   COLONOSCOPY  04/13/2019   DIAGNOSTIC LAPAROSCOPY     cyst removed from ovaries    ESOPHAGOGASTRODUODENOSCOPY     lypoma  2011   NECK SURGERY  [phone]   c spine ant and post hx fusion and removal or harcdware and shavings   POLYPECTOMY     rods and screws in lumbar     TONSILLECTOMY     UPPER GASTROINTESTINAL ENDOSCOPY  04/13/2019    Family Psychiatric History: denies  Family History:  Family History  Problem Relation Age of Onset   CAD Father        tumor between heart and lung    Hypertension Father    Cancer Father    CAD Sister        ? dx    Hypertension Mother    Lung cancer Mother        died 35    Diabetes Maternal Grandmother        late 1s    Colon cancer Neg Hx    Colon polyps  Neg Hx    Esophageal cancer Neg Hx    Rectal cancer Neg Hx    Stomach cancer Neg Hx     Social History:  Social History   Socioeconomic History   Marital status: Significant Other    Spouse name: [name]   Number of children: 2   Years of education: Not on file   Highest education level: Not on file  Occupational History   Occupation: retired    Fish farm manager: [name]  Tobacco Use   Smoking status: Former    Packs/day: 0.25    Years: 44.00  Total pack years: 11.00    Types: Cigarettes    Quit date: 02/04/2014    Years since quitting: 8.2   Smokeless tobacco: Never   Tobacco comments:    quit smoking in Dec 2015  Vaping Use   Vaping Use: Former  Substance and Sexual Activity   Alcohol use: Yes    Alcohol/week: 4.0 standard drinks of alcohol    Types: 4 Standard drinks or equivalent per week    Comment: couple times a week   Drug use: No   Sexual activity: Not Currently    Birth control/protection: Surgical    Comment: 1st intercourse 67 yo-Fewer than 5 partners  Other Topics Concern   Not on file  Social History Narrative   6-8 hours of sleep per night   Lives with her fiance hh of 2 no pets    1 dog in the home   Retired no ets  But tob 8 per day   etoh ocass 1-2    On disability from her neck surgery predicaments .   orig from [employer] in [place] area.    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 2   Last Pap 2006 last mammogram 2000 and   Has dentures   FA    Social Determinants of Health   Financial Resource Strain: Medium Risk (12/21/2020)   Overall Financial Resource Strain (CARDIA)    Difficulty of Paying Living Expenses: Somewhat hard  Food Insecurity: No Food Insecurity (12/21/2020)   Hunger Vital Sign    Worried About Running Out of Food in the Last Year: Never true    Ran Out of Food in the Last Year: Never true  Transportation Needs: No Transportation Needs (12/21/2020)   PRAPARE - Radiographer, therapeutic (Medical): No    Lack of Transportation (Non-Medical): No  Physical Activity: Inactive (12/21/2020)   Exercise Vital Sign    Days of Exercise per Week: 0 days    Minutes of Exercise per Session: 0 min  Stress: No Stress Concern Present (12/21/2020)   [place]    Feeling of Stress : Only a little  Social Connections: Socially Isolated (12/21/2020)   Social Connection and Isolation Panel [NHANES]    Frequency of Communication with Friends and Family: Twice a week    Frequency of Social Gatherings with Friends and Family: Three times a week    Attends Religious Services: Never    Active Member of Clubs or Organizations: No    Attends Archivist Meetings: Never    Marital Status: Divorced    Allergies:  Allergies  Allergen Reactions   Wellbutrin [Bupropion] Nausea And Vomiting    Current Medications: Current Outpatient Medications  Medication Sig Dispense Refill   albuterol (PROAIR HFA) 108 (90 Base) MCG/ACT inhaler 2 puffs every 4 hours as needed only  if your can't catch your breath 18 g 11   allopurinol (ZYLOPRIM) 100 MG tablet TAKE 1 TABLET BY MOUTH TWICE A DAY 60 tablet 2   amoxicillin-clavulanate (AUGMENTIN) 875-125 MG tablet Take 1 tablet by mouth every 12 (twelve) hours. 14 tablet 0   bisoprolol-hydrochlorothiazide (ZIAC) 5-6.25 MG tablet TAKE 1 TABLET BY MOUTH TWICE A DAY 60 tablet 3   blood glucose meter kit and supplies KIT Dispense based on patient and insurance preference. Use up to four times daily as directed. 1 each 0   Budeson-Glycopyrrol-Formoterol (BREZTRI AEROSPHERE) 160-9-4.8 MCG/ACT  AERO Inhale 2 puffs into the lungs in the morning and at bedtime. 32.1 g 3   cloNIDine (CATAPRES) 0.1 MG tablet TAKE 1 TABLET BY MOUTH TWICE A DAY 60 tablet 2   Colchicine 0.6 MG CAPS Take 0.6 mg by mouth 2 (two) times daily as needed. 60 capsule 1   DULoxetine (CYMBALTA) 60 MG capsule Take 1  capsule (60 mg total) by mouth 2 (two) times daily. 180 capsule 0   Glucose Blood (BLOOD GLUCOSE TEST STRIPS 333) STRP 1 strip by In Vitro route in the morning and at bedtime. 200 strip 1   hydrOXYzine (ATARAX) 50 MG tablet Take 1 tablet (50 mg total) by mouth at bedtime. 30 tablet 2   Lancets MISC 1 Device by Does not apply route 2 (two) times daily. 200 each 1   omeprazole (PRILOSEC) 40 MG capsule Take 30- 60 min before your first and last meals of the day 60 capsule 11   rosuvastatin (CRESTOR) 10 MG tablet TAKE 1 TABLET BY MOUTH IN THE EVENING 90 tablet 1   No current facility-administered medications for this visit.     Psychiatric Specialty Exam: Review of Systems  There were no vitals taken for this visit.There is no height or weight on file to calculate BMI.  General Appearance: Fairly Groomed  Eye Contact:  Good  Speech:  Clear and Coherent  Volume:  Normal  Mood:  Euthymic  Affect:  Congruent  Thought Process:  Coherent and Linear  Orientation:  Full (Time, Place, and Person)  Thought Content: Logical   Suicidal Thoughts:  No  Homicidal Thoughts:  No  Memory:  NA  Judgement:  Good  Insight:  Good  Psychomotor Activity:  Normal  Concentration:  Concentration: Good  Recall:  Good  Fund of Knowledge: Good  Language: Good  Akathisia:  NA    AIMS (if indicated): not done  Assets:  Desire for Improvement Housing Social Support  ADL's:  Intact  Cognition: WNL  Sleep:  Fair   Metabolic Disorder Labs: Lab Results  Component Value Date   HGBA1C 5.7 (A) 11/13/2021   No results found for: "PROLACTIN" Lab Results  Component Value Date   CHOL 184 05/11/2021   TRIG 238 (H) 05/11/2021   HDL 58 05/11/2021   CHOLHDL 3.2 05/11/2021   VLDL 51.0 (H) 05/09/2020   LDLCALC 87 05/11/2021   LDLCALC 48 11/15/2020   Lab Results  Component Value Date   TSH 1.21 11/13/2021   TSH 3.06 05/09/2020    Therapeutic Level Labs: No results found for: "LITHIUM" No results found  for: "VALPROATE" No results found for: "CBMZ"   Screenings: PHQ2-9    [place] Office Visit from 11/13/2021 in [facility] [place] from 10/11/2021 in [facility] Office Visit from 08/16/2021 in [facility] at [place] from 07/11/2021 in [facility] [place] from 12/21/2020 in [facility] at [place]  PHQ-2 Total Score '1 5 4 6 1  '$ PHQ-9 Total Score '6 19 13 20 '$ --      Flowsheet Row ED from 04/07/2022 in [facility] Emergency Department at [place] No Risk       Collaboration of Care: Collaboration of Care: Medication Management AEB medication prescription  Patient/Guardian was advised Release of Information must be obtained prior to any record release in order to collaborate their care with an outside provider. Patient/Guardian was advised  if they have not already done so to contact the registration department to sign all necessary forms in order for [place] to release information regarding their care.   Consent: Patient/Guardian gives verbal consent for treatment and assignment of benefits for services provided during this visit. Patient/Guardian expressed understanding and agreed to proceed.    [name], MD 05/01/2022, 9:29 AM   Virtual Visit via Video Note  I connected with [name] on 05/01/22 at  9:30 AM EDT by a video enabled telemedicine application and verified that I am speaking with the correct person using two identifiers.  Location: Patient: Home Provider: Home Office   I discussed the limitations of evaluation and management by telemedicine and the availability of in person appointments. The patient expressed understanding and agreed to proceed.   I discussed the assessment and treatment plan with the patient. The patient was provided an opportunity to ask  questions and all were answered. The patient agreed with the plan and demonstrated an understanding of the instructions.   The patient was advised to call back or seek an in-person evaluation if the symptoms worsen or if the condition fails to improve as anticipated.  I provided 20 minutes of non-face-to-face time during this encounter.   [name], MD

## 2022-05-07 ENCOUNTER — Telehealth: Payer: Self-pay | Admitting: Internal Medicine

## 2022-05-07 NOTE — Telephone Encounter (Signed)
Contacted Samantha Hinton to schedule their annual wellness visit. Appointment made for 05/09/22.  Samantha Hinton AWV direct phone # [phone]

## 2022-05-09 ENCOUNTER — Telehealth: Payer: Self-pay

## 2022-05-09 NOTE — Progress Notes (Signed)
? ?  Subjective:  ?This encounter was created in error - please disregard. ?

## 2022-05-09 NOTE — Telephone Encounter (Signed)
Unsuccessful attempt to reach patient on preferred number listed in notes for scheduled AWV. Left message on voicemail okay to reschedule. 

## 2022-05-14 ENCOUNTER — Ambulatory Visit: Payer: Medicare Other | Admitting: Internal Medicine

## 2022-05-14 NOTE — Progress Notes (Deleted)
No chief complaint on file.   HPI: Samantha Hinton 67 y.o. come in for Chronic disease management  6 mos fu  in interim  under rx depression pulmonary for copd  [facility] breast dr Volanda Napoleon  Ed hypokalemia and cystitis  Gi dr Tarri Glenn who is leaving the practice   HLD  crestor Bp ziac  Allopurinol  ROS: See pertinent positives and negatives per HPI.  Past Medical History:  Diagnosis Date   Anxiety    Asthma    Chronic back pain    buldging disc and tumor.Spondylolisthesis   COPD (chronic obstructive pulmonary disease) (HCC)    Symbicort daily and ProAir as needed   Depression    Emphysema    Emphysema of lung ([place])    Fatty liver 04/13/2019   Fatty liver    Fibromyalgia    Gallbladder problem    Genital warts    GERD (gastroesophageal reflux disease)    takes Omeprazole daily   Heartburn    History of bronchitis 01/2014   History of colon polyps    Hyperlipidemia    has been off of meds d/t getting sick from them.Not been addressed again.    Hypertension    takes Ziac daily   Nocturia    Pneumonia    hx of-8+yrs ago   PONV (postoperative nausea and vomiting)    Shortness of breath dyspnea    thinks d/t pain meds and notices with exertion   SOBOE (shortness of breath on exertion)    Stroke ([place])    16 yrs ago--left eye    Swallowing difficulty     Family History  Problem Relation Age of Onset   CAD Father        tumor between heart and lung    Hypertension Father    Cancer Father    CAD Sister        ? dx    Hypertension Mother    Lung cancer Mother        died 88    Diabetes Maternal Grandmother        late 12s    Colon cancer Neg Hx    Colon polyps Neg Hx    Esophageal cancer Neg Hx    Rectal cancer Neg Hx    Stomach cancer Neg Hx     Social History   Socioeconomic History   Marital status: Significant Other    Spouse name: [name]   Number of children: 2   Years of education: Not on file   Highest education level: Not on file   Occupational History   Occupation: retired    Fish farm manager: Scientist, research (physical sciences)  Tobacco Use   Smoking status: Former    Packs/day: 0.25    Years: 44.00    Additional pack years: 0.00    Total pack years: 11.00    Types: Cigarettes    Quit date: 02/04/2014    Years since quitting: 8.2   Smokeless tobacco: Never   Tobacco comments:    quit smoking in Dec 2015  Vaping Use   Vaping Use: Former  Substance and Sexual Activity   Alcohol use: Yes    Alcohol/week: 4.0 standard drinks of alcohol    Types: 4 Standard drinks or equivalent per week    Comment: couple times a week   Drug use: No   Sexual activity: Not Currently    Birth control/protection: Surgical    Comment: 1st intercourse 67 yo-Fewer than 5 partners  Other Topics Concern  Not on file  Social History Narrative   6-8 hours of sleep per night   Lives with her fiance hh of 2 no pets    1 dog in the home   Retired no ets  But tob 8 per day   etoh ocass 1-2    On disability from her neck surgery predicaments .   orig from [employer] in [place] area.    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 2   Last Pap 2006 last mammogram 2000 and   Has dentures   FA    Social Determinants of Health   Financial Resource Strain: Medium Risk (12/21/2020)   Overall Financial Resource Strain (CARDIA)    Difficulty of Paying Living Expenses: Somewhat hard  Food Insecurity: No Food Insecurity (12/21/2020)   Hunger Vital Sign    Worried About Running Out of Food in the Last Year: Never true    Ran Out of Food in the Last Year: Never true  Transportation Needs: No Transportation Needs (12/21/2020)   PRAPARE - Hydrologist (Medical): No    Lack of Transportation (Non-Medical): No  Physical Activity: Inactive (12/21/2020)   Exercise Vital Sign    Days of Exercise per Week: 0 days    Minutes of Exercise per Session: 0 min  Stress: No Stress Concern Present (12/21/2020)   [place]    Feeling of Stress : Only a little  Social Connections: Socially Isolated (12/21/2020)   Social Connection and Isolation Panel [NHANES]    Frequency of Communication with Friends and Family: Twice a week    Frequency of Social Gatherings with Friends and Family: Three times a week    Attends Religious Services: Never    Active Member of Clubs or Organizations: No    Attends Archivist Meetings: Never    Marital Status: Divorced    Outpatient Medications Prior to Visit  Medication Sig Dispense Refill   albuterol (PROAIR HFA) 108 (90 Base) MCG/ACT inhaler 2 puffs every 4 hours as needed only  if your can't catch your breath 18 g 11   allopurinol (ZYLOPRIM) 100 MG tablet TAKE 1 TABLET BY MOUTH TWICE A DAY 60 tablet 2   amoxicillin-clavulanate (AUGMENTIN) 875-125 MG tablet Take 1 tablet by mouth every 12 (twelve) hours. 14 tablet 0   bisoprolol-hydrochlorothiazide (ZIAC) 5-6.25 MG tablet TAKE 1 TABLET BY MOUTH TWICE A DAY 60 tablet 3   blood glucose meter kit and supplies KIT Dispense based on patient and insurance preference. Use up to four times daily as directed. 1 each 0   Budeson-Glycopyrrol-Formoterol (BREZTRI AEROSPHERE) 160-9-4.8 MCG/ACT AERO Inhale 2 puffs into the lungs in the morning and at bedtime. 32.1 g 3   cloNIDine (CATAPRES) 0.1 MG tablet TAKE 1 TABLET BY MOUTH TWICE A DAY 60 tablet 2   Colchicine 0.6 MG CAPS Take 0.6 mg by mouth 2 (two) times daily as needed. 60 capsule 1   DULoxetine (CYMBALTA) 60 MG capsule Take 1 capsule (60 mg total) by mouth 2 (two) times daily. 180 capsule 0   Glucose Blood (BLOOD GLUCOSE TEST STRIPS 333) STRP 1 strip by In Vitro route in the morning and at bedtime. 200 strip 1   hydrOXYzine (ATARAX) 50 MG tablet Take 1 tablet (50 mg total) by mouth at bedtime. 90 tablet 1   Lancets MISC 1 Device by Does not apply route 2 (two) times  daily. 200 each 1   omeprazole (PRILOSEC) 40 MG capsule  Take 30- 60 min before your first and last meals of the day 60 capsule 11   rosuvastatin (CRESTOR) 10 MG tablet TAKE 1 TABLET BY MOUTH IN THE EVENING 90 tablet 1   No facility-administered medications prior to visit.     EXAM:  There were no vitals taken for this visit.  There is no height or weight on file to calculate BMI.  GENERAL: vitals reviewed and listed above, alert, oriented, appears well hydrated and in no acute distress HEENT: atraumatic, conjunctiva  clear, no obvious abnormalities on inspection of external nose and ears OP : no lesion edema or exudate  NECK: no obvious masses on inspection palpation  LUNGS: clear to auscultation bilaterally, no wheezes, rales or rhonchi, good air movement CV: HRRR, no clubbing cyanosis or  peripheral edema nl cap refill  MS: moves all extremities without noticeable focal  abnormality PSYCH: pleasant and cooperative, no obvious depression or anxiety Lab Results  Component Value Date   WBC 12.3 (H) 04/07/2022   HGB 13.0 04/07/2022   HCT 41.2 04/07/2022   PLT 235 04/07/2022   GLUCOSE 136 (H) 04/07/2022   CHOL 184 05/11/2021   TRIG 238 (H) 05/11/2021   HDL 58 05/11/2021   LDLDIRECT 242.0 05/09/2020   LDLCALC 87 05/11/2021   ALT 10 04/07/2022   AST 14 (L) 04/07/2022   NA 140 04/07/2022   K 2.9 (L) 04/07/2022   CL 94 (L) 04/07/2022   CREATININE 0.86 04/07/2022   BUN 12 04/07/2022   CO2 34 (H) 04/07/2022   TSH 1.21 11/13/2021   HGBA1C 5.7 (A) 11/13/2021   MICROALBUR 5.2 (H) 05/09/2020   BP Readings from Last 3 Encounters:  04/07/22 (!) 142/77  01/24/22 (!) 160/82  12/08/21 (!) 172/84    ASSESSMENT AND PLAN:  Discussed the following assessment and plan:  No diagnosis found.  -Patient advised to return or notify health care team  if  new concerns arise.  There are no Patient Instructions on file for this visit.   Standley Brooking. Rontae Inglett M.D.

## 2022-05-15 ENCOUNTER — Ambulatory Visit: Payer: Medicare Other | Admitting: Internal Medicine

## 2022-05-22 ENCOUNTER — Other Ambulatory Visit: Payer: Self-pay | Admitting: Internal Medicine

## 2022-05-24 ENCOUNTER — Telehealth: Payer: Self-pay | Admitting: Internal Medicine

## 2022-05-24 NOTE — Telephone Encounter (Signed)
Called patient to schedule Medicare Annual Wellness Visit (AWV). Left message for patient to call back and schedule Medicare Annual Wellness Visit (AWV).  Last date of AWV: 12/21/20  Please schedule an appointment at any time with [facility] or [facility].  If any questions, please contact me at [phone].  Thank you ,  Barkley Boards AWV direct phone # [phone]

## 2022-05-26 ENCOUNTER — Other Ambulatory Visit: Payer: Self-pay | Admitting: Internal Medicine

## 2022-06-25 ENCOUNTER — Encounter (HOSPITAL_COMMUNITY): Payer: Self-pay

## 2022-06-28 ENCOUNTER — Other Ambulatory Visit: Payer: Self-pay | Admitting: Internal Medicine

## 2022-07-03 ENCOUNTER — Telehealth (HOSPITAL_COMMUNITY): Payer: Medicare Other | Admitting: Psychiatry

## 2022-07-23 ENCOUNTER — Encounter (HOSPITAL_BASED_OUTPATIENT_CLINIC_OR_DEPARTMENT_OTHER): Payer: Self-pay | Admitting: Emergency Medicine

## 2022-07-23 ENCOUNTER — Emergency Department (HOSPITAL_BASED_OUTPATIENT_CLINIC_OR_DEPARTMENT_OTHER): Payer: Medicare Other | Admitting: Radiology

## 2022-07-23 ENCOUNTER — Emergency Department (HOSPITAL_BASED_OUTPATIENT_CLINIC_OR_DEPARTMENT_OTHER)
Admission: EM | Admit: 2022-07-23 | Discharge: 2022-07-23 | Disposition: A | Discharge status: Home / Self Care | Payer: Medicare Other | Attending: Emergency Medicine | Admitting: Emergency Medicine

## 2022-07-23 ENCOUNTER — Emergency Department (HOSPITAL_BASED_OUTPATIENT_CLINIC_OR_DEPARTMENT_OTHER): Payer: Medicare Other

## 2022-07-23 DIAGNOSIS — S8001XA Contusion of right knee, initial encounter: Secondary | ICD-10-CM | POA: Insufficient documentation

## 2022-07-23 DIAGNOSIS — I10 Essential (primary) hypertension: Secondary | ICD-10-CM | POA: Diagnosis not present

## 2022-07-23 DIAGNOSIS — J449 Chronic obstructive pulmonary disease, unspecified: Secondary | ICD-10-CM | POA: Insufficient documentation

## 2022-07-23 DIAGNOSIS — W08XXXA Fall from other furniture, initial encounter: Secondary | ICD-10-CM | POA: Diagnosis not present

## 2022-07-23 DIAGNOSIS — M25561 Pain in right knee: Secondary | ICD-10-CM

## 2022-07-23 DIAGNOSIS — S8991XA Unspecified injury of right lower leg, initial encounter: Secondary | ICD-10-CM | POA: Diagnosis present

## 2022-07-23 DIAGNOSIS — Y92009 Unspecified place in unspecified non-institutional (private) residence as the place of occurrence of the external cause: Secondary | ICD-10-CM | POA: Diagnosis not present

## 2022-07-23 LAB — BASIC METABOLIC PANEL
Anion gap: 13 (ref 5–15)
BUN: 21 mg/dL (ref 8–23)
CO2: 30 mmol/L (ref 22–32)
Calcium: 9.4 mg/dL (ref 8.9–10.3)
Chloride: 95 mmol/L — ABNORMAL LOW (ref 98–111)
Creatinine, Ser: 0.87 mg/dL (ref 0.44–1.00)
GFR, Estimated: >60 mL/min (ref >60)
Glucose, Bld: 92 mg/dL (ref 70–99)
Potassium: 3.4 mmol/L — ABNORMAL LOW (ref 3.5–5.1)
Sodium: 138 mmol/L (ref 135–145)

## 2022-07-23 LAB — CBC
HCT: 40.3 % (ref 36.0–46.0)
Hemoglobin: 12.9 g/dL (ref 12.0–15.0)
MCH: 27.5 pg (ref 26.0–34.0)
MCHC: 32 g/dL (ref 30.0–36.0)
MCV: 85.9 fL (ref 80.0–100.0)
Platelets: 244 10*3/uL (ref 150–400)
RBC: 4.69 MIL/uL (ref 3.87–5.11)
RDW: 14.3 % (ref 11.5–15.5)
WBC: 9.1 10*3/uL (ref 4.0–10.5)
nRBC: 0 % (ref 0.0–0.2)

## 2022-07-23 LAB — D-DIMER, QUANTITATIVE: D-Dimer, Quant: 0.6 ug/mL-FEU — ABNORMAL HIGH (ref 0.00–0.50)

## 2022-07-23 LAB — TROPONIN I (HIGH SENSITIVITY): Troponin I (High Sensitivity): 3 ng/L (ref <18)

## 2022-07-23 MED ORDER — HYDROCODONE-ACETAMINOPHEN 5-325 MG PO TABS: 2.0000 | ORAL_TABLET | ORAL | 0 refills | Status: DC | PRN

## 2022-07-23 MED ORDER — HYDROCODONE-ACETAMINOPHEN 5-325 MG PO TABS
1.0000 | ORAL_TABLET | Freq: Once | ORAL | Status: AC
  Administered 2022-07-23: 1 via ORAL
  Filled 2022-07-23: qty 1

## 2022-07-23 NOTE — ED Notes (Signed)
Patient verbalizes understanding of discharge instructions. Opportunity for questioning and answers were provided. Patient discharged from ED.  °

## 2022-07-23 NOTE — ED Notes (Signed)
Patient transported to Ultrasound 

## 2022-07-23 NOTE — ED Triage Notes (Signed)
Pt injured R knee with fall at home 5/23. Went to [facility] yesterday for pain. XR was normal. Warm to touch, swelling. VSS. Afebrile.

## 2022-07-23 NOTE — ED Provider Notes (Signed)
[place] EMERGENCY DEPARTMENT AT [facility] Provider Note   CSN: [id number] Arrival date & time: 07/23/22  1147     History  Chief Complaint  Patient presents with   Knee Pain   HPI Samantha Hinton is a 67 y.o. female with COPD, hypertension and hyperlipidemia presenting for right knee pain. States she was sitting on her couch and got up to walk across the room when her purse strap wrapped around her leg and she fell landing on her right knee. This happened on May 23. Since then she has had notable swelling in the right knee along with pain.  Still able to ambulate and bear weight.  Also noticing swelling extending from the knee down to the ankle.  The lateral aspect of the calf is tender and purple.  Denies chest pain shortness of breath.  States she was evaluated yesterday by [facility] and had a negative x-ray of the right knee.   Knee Pain      Home Medications Prior to Admission medications   Medication Sig Start Date End Date Taking? Authorizing Provider  albuterol (PROAIR HFA) 108 (90 Base) MCG/ACT inhaler 2 puffs every 4 hours as needed only  if your can't catch your breath 05/05/20   Nyoka Cowden, MD  allopurinol (ZYLOPRIM) 100 MG tablet TAKE 1 TABLET BY MOUTH TWICE A DAY 06/28/22   Worthy Rancher B, FNP  amoxicillin-clavulanate (AUGMENTIN) 875-125 MG tablet Take 1 tablet by mouth every 12 (twelve) hours. 04/07/22   Achille Rich, PA-C  bisoprolol-hydrochlorothiazide [facility]) 5-6.25 MG tablet TAKE 1 TABLET BY MOUTH TWICE A DAY 05/22/22   Worthy Rancher B, FNP  blood glucose meter kit and supplies KIT Dispense based on patient and insurance preference. Use up to four times daily as directed. 07/13/21   Whitmire, Thermon Leyland, FNP  Budeson-Glycopyrrol-Formoterol (BREZTRI AEROSPHERE) 160-9-4.8 MCG/ACT AERO Inhale 2 puffs into the lungs in the morning and at bedtime. 12/08/21   Nyoka Cowden, MD  cloNIDine (CATAPRES) 0.1 MG tablet TAKE 1 TABLET BY MOUTH TWICE A DAY 05/27/22    Worthy Rancher B, FNP  Colchicine 0.6 MG CAPS Take 0.6 mg by mouth 2 (two) times daily as needed. 08/11/19   Nadara Mustard, MD  DULoxetine (CYMBALTA) 60 MG capsule Take 1 capsule (60 mg total) by mouth 2 (two) times daily. 05/01/22 05/01/23  Stasia Cavalier, MD  Glucose Blood (BLOOD GLUCOSE TEST STRIPS 333) STRP 1 strip by In Vitro route in the morning and at bedtime. 07/13/21   Whitmire, Thermon Leyland, FNP  hydrOXYzine (ATARAX) 50 MG tablet Take 1 tablet (50 mg total) by mouth at bedtime. 05/01/22   Stasia Cavalier, MD  Lancets MISC 1 Device by Does not apply route 2 (two) times daily. 07/13/21   Whitmire, Thermon Leyland, FNP  omeprazole (PRILOSEC) 40 MG capsule Take 30- 60 min before your first and last meals of the day 12/08/21   Nyoka Cowden, MD  rosuvastatin (CRESTOR) 10 MG tablet TAKE 1 TABLET BY MOUTH IN THE EVENING 04/18/22   Panosh, Neta Mends, MD      Allergies    Wellbutrin [bupropion]    Review of Systems   Review of Systems  Musculoskeletal:  Positive for joint swelling.    Physical Exam Updated Vital Signs BP (!) 143/53   Pulse 71   Temp 98.4 F (36.9 C) (Oral)   Resp 16   Ht 5\' 5"  (1.651 m)   Wt 90.3 kg   SpO2 94%  BMI 33.12 kg/m  Physical Exam Constitutional:      Appearance: Normal appearance.  HENT:     Head: Normocephalic.     Nose: Nose normal.  Eyes:     Conjunctiva/sclera: Conjunctivae normal.  Pulmonary:     Effort: Pulmonary effort is normal.  Musculoskeletal:     Right knee: Swelling, effusion and erythema present. No deformity, bony tenderness or crepitus. Normal range of motion. Tenderness present. Normal pulse.     Left knee: Normal.       Legs:     Comments: Notable ecchymosis, edema and tenderness about the lateral aspect of the lower leg.  Able to ambulate and bear weight.  Gait is steady and without limp.  Knee is warm to touch but not hot.  Did not elicit pain with flexion or extension of the knee.  Neurological:     Mental Status: She is alert.   Psychiatric:        Mood and Affect: Mood normal.     ED Results / Procedures / Treatments   Labs (all labs ordered are listed, but only abnormal results are displayed) Labs Reviewed  BASIC METABOLIC PANEL - Abnormal; Notable for the following components:      Result Value   Potassium 3.4 (*)    Chloride 95 (*)    All other components within normal limits  D-DIMER, QUANTITATIVE - Abnormal; Notable for the following components:   D-Dimer, Quant 0.60 (*)    All other components within normal limits  CBC  TROPONIN I (HIGH SENSITIVITY)    EKG None  Radiology DG Knee Complete 4 Views Right  Result Date: 07/23/2022 CLINICAL DATA:  Fall with right knee pain EXAM: RIGHT KNEE - COMPLETE 4 VIEW COMPARISON:  None Available. FINDINGS: There are no findings of fracture or dislocation. Small joint effusion. There is no evidence of arthropathy or other focal bone abnormality. Prepatellar soft tissue thickening. IMPRESSION: 1. No acute fracture or dislocation. 2. Small joint effusion and prepatellar soft tissue thickening. Electronically Signed   By: Agustin Cree M.D.   On: 07/23/2022 14:52   US Venous Img Lower Right (DVT Study)  Result Date: 07/23/2022 CLINICAL DATA:  Pain and swelling post fall EXAM: LEFT LOWER EXTREMITY VENOUS DOPPLER ULTRASOUND TECHNIQUE: Gray-scale sonography with compression, as well as color and duplex ultrasound, were performed to evaluate the deep venous system(s) from the level of the common femoral vein through the popliteal and proximal calf veins. COMPARISON:  None Available. FINDINGS: VENOUS Normal compressibility of the common femoral, superficial femoral, and popliteal veins, as well as the visualized calf veins. Visualized portions of profunda femoral vein and great saphenous vein unremarkable. No filling defects to suggest DVT on grayscale or color Doppler imaging. Doppler waveforms show normal direction of venous flow, normal respiratory plasticity and response to  augmentation. Limited views of the contralateral common femoral vein are unremarkable. OTHER None. Limitations: none IMPRESSION: Negative. Electronically Signed   By: Corlis Leak M.D.   On: 07/23/2022 14:49    Procedures Procedures    Medications Ordered in ED Medications  HYDROcodone-acetaminophen (NORCO/VICODIN) 5-325 MG per tablet 1 tablet (1 tablet Oral Given 07/23/22 1406)    ED Course/ Medical Decision Making/ A&P                             Medical Decision Making Amount and/or Complexity of Data Reviewed Labs: ordered. Radiology: ordered.  Risk Prescription drug management.  67 year old well-appearing female presenting for knee pain. Exam notable for swelling and warmth to the lateral aspect of the right knee along with erythema, swelling and tenderness about the lateral aspect of the lower right leg as well. DDx includes DVT, fracture dislocation in the knee, septic arthritis, PE.  DVT study was negative.  X-ray of the right knee did reveal small joint effusion and prepatellar soft tissue thickening.  Did have some concern for septic arthritis given exam findings this prompted discussion with Dr. Magnus Ivan of Ortho. He advised likely not septic given she has no white count, afebrile and able to ambulate and flex and extend the knee without pain. Stated that it is likely a prepatellar bursitis. Advised to follow-up with Ortho.  Vitals remained stable on encounter.  Also considered PE but unlikely given patient is not short of breath, not tachycardic and age-adjusted D-dimer is negative.  Patient is not assigned to an orthopedic doctor [facility] and advised that she be willing to see someone in our system.  Vital stable discharge.  Applied knee brace.  Sent Norco to her pharmacy.  Discussed return precautions.  Discharged home.        Final Clinical Impression(s) / ED Diagnoses Final diagnoses:  Right knee pain, unspecified chronicity    Rx / DC Orders ED Discharge Orders      None         Gareth Eagle, PA-C 07/23/22 1728    Rondel Baton, MD 07/26/22 1021

## 2022-07-23 NOTE — Discharge Instructions (Addendum)
Evaluation for your right knee pain and swelling revealed that it is likely a prepatellar bursitis.  Advised to follow-up with Ortho.  In the meantime you can apply the brace while you are walking.  Sent Norco to your pharmacy for acute pain relief.  If you have worsening swelling, tenderness, worsening pain with flexion extension of your knee, develop fever or any other concerning symptom please return [facility] for further evaluation.

## 2022-07-25 ENCOUNTER — Other Ambulatory Visit: Payer: Self-pay | Admitting: Family

## 2022-07-26 ENCOUNTER — Telehealth (HOSPITAL_BASED_OUTPATIENT_CLINIC_OR_DEPARTMENT_OTHER): Payer: Medicare Other | Admitting: Psychiatry

## 2022-07-26 DIAGNOSIS — F3342 Major depressive disorder, recurrent, in full remission: Secondary | ICD-10-CM | POA: Diagnosis not present

## 2022-07-26 DIAGNOSIS — F4321 Adjustment disorder with depressed mood: Secondary | ICD-10-CM

## 2022-07-26 NOTE — Progress Notes (Signed)
BH MD/PA/NP OP Progress Note  07/26/2022 11:27 AM Samantha Hinton  MRN:  [id number]  Visit Diagnosis:    ICD-10-CM   1. Recurrent major depressive disorder, in full remission (HCC)  F33.42     2. Grief reaction  F43.21        Assessment: Patient is a 67 year old single female with a past psychiatric history of MDD, past trauma, and one prior psychiatric hospitalization in the 1990's following a medication overdose. She presented to [facility] behavioral health on 10/11/21 for initial evaluation of derepressed mood and increased alcohol use in the context of the passing of her partner of 14 years.   At initial evaluation patient endorsed neurovegetative symptoms of depression including hopelessness, anhedonia, amotivation, insomnia, guilt, increased appetite, racing thoughts, and grief response during initial evaluation. Patient had increased her alcohol use to  a couple beers a night as a coping mechanism, was cautioned on the dangers of alcohol use in depression, and patient has since discontinued her alcohol use.  Patient has protective factors in her financial stability, and social support from her son and grandkids. Patient met criteria for grief response and MDD at time of her initial evaluation.  With the grief response and improving after several months.  Samantha Hinton presents for follow-up evaluation. Today, 07/26/22, patient reports experiencing increased anxiety, nightmares, and confusion particularly at night that started around 3 months ago.  Patient denies any particular triggers or changes that could have led to the onset of these symptoms.  She attributed to her psychiatric medications and opted to discontinue them around 4-6 weeks ago.  While she did experience withdrawal after discontinuing the medication that has since resolved.  Patient also notes that the anxiety, nightmares, and confusion have also resolved with the discontinuation of the Cymbalta and Atarax.  It is possible  that the anticholinergic effect of Atarax was contributing to the confusion symptoms.  While nightmares could potentially related to the Cymbalta though onset after several months would be atypical.  As patient's mood remained stable off of the Cymbalta and Atarax it does not seem necessary to restart any medications at this time.  Patient was cautioned about stopping medications suddenly in the future and the importance of tapering to manage withdrawal was reviewed.  Patient will follow-up as needed in the future.   Plan: - Discontinue Cymbalta, due to nightmares - Discontinue Atarax 50 mg nightly, due to concern of delirium - Continue clonidine 0.1 BID for BP management (managed by PCP - Patient restarted on gabapentin now at 200 mg BID for pain (managed by Dr. Katrinka Blazing) - CBC, CMP, A1c, TSH, and glucose reviewed.  -Therapy referral made, patient declined can consider in the future if needed - Can reach out in the future if needed   Chief Complaint:  Chief Complaint  Patient presents with   Follow-up   HPI: Samantha Hinton presents reporting that she stopped taking the Cymbalta around a month ago. She had some withdrawal at the time. She was having some issues with fear (afraid to drive), nightmares, and hallucinations. This was happening during the day and at night. She thinks that these symptoms started around 3 months ago or so. Patient wanted to take a break from the medicine as she is concerned that was causing her issues. Mood has been ok since stopping the medication a month ago and she thinks that she is better without it. She did experience withdrawal from suddenly stopping Cymbalta, which has since resolved. We did review  the importance of tapering off the medication in the future.  As patient's increased depression was likely secondary due to the passing of her partner and associated grief which she has since processed, the addition of further medication does not seem necessary at this time.  It  is possible that the patient's nightmares and confusion at night are secondary to the hydroxyzine and potentially Cymbalta.  The cause of her increased anxiety particular driving is a bit unclear though patient will continue to monitor for recurrence after having discontinued Cymbalta.  Outside of this patient reports that things are going well.  She continues to see her son and grandkids regularly and is going on a cruise with some friends that she is excited for.  She had 1 fall recently which has led to right knee pain.  She is following up with orthopedics for this.  As patient is currently stable and has discontinued medications we opted to hold off on scheduling follow-up at this time.  She will reach out if needed in the future.  Past Psychiatric History: Patient reports an extensive past psychiatric history starting from a traumatic upbringing. She was started on antidepressant medications following a suicide attempt in the 90's, at which time she hospitalized on an inpatient psychiatric unit. Patient has tried Zoloft (negative side effects), Effexor (ineffective), Cymbalta, Wellbutrin, gabapentin, trazodone, doxepin, Atarax, and clonidine in the past. Remeron discontinued due to concern of oversedation/potential fall/weight gain.   Patient denies any alcohol use in the last several months.  Past Medical History:  Past Medical History:  Diagnosis Date   Anxiety    Asthma    Chronic back pain    buldging disc and tumor.Spondylolisthesis   COPD (chronic obstructive pulmonary disease) (HCC)    Symbicort daily and ProAir as needed   Depression    Emphysema    Emphysema of lung (HCC)    Fatty liver 04/13/2019   Fatty liver    Fibromyalgia    Gallbladder problem    Genital warts    GERD (gastroesophageal reflux disease)    takes Omeprazole daily   Heartburn    History of bronchitis 01/2014   History of colon polyps    Hyperlipidemia    has been off of meds d/t getting sick from  them.Not been addressed again.    Hypertension    takes Ziac daily   Nocturia    Pneumonia    hx of-8+yrs ago   PONV (postoperative nausea and vomiting)    Shortness of breath dyspnea    thinks d/t pain meds and notices with exertion   SOBOE (shortness of breath on exertion)    Stroke (HCC)    16 yrs ago--left eye    Swallowing difficulty     Past Surgical History:  Procedure Laterality Date   ABDOMINAL HYSTERECTOMY  1983   partial    ADENOIDECTOMY     ANTERIOR (CYSTOCELE) AND POSTERIOR REPAIR (RECTOCELE) WITH XENFORM GRAFT AND SACROSPINOUS FIXATION     APPENDECTOMY  1967   BACK SURGERY  2017   lower back   BREAST SURGERY Right    diseased milk glands   CHOLECYSTECTOMY  1983   COLONOSCOPY  04/13/2019   DIAGNOSTIC LAPAROSCOPY     cyst removed from ovaries    ESOPHAGOGASTRODUODENOSCOPY     lypoma  2011   NECK SURGERY  [phone]   c spine ant and post hx fusion and removal or harcdware and shavings   POLYPECTOMY     rods and screws  in lumbar     TONSILLECTOMY     UPPER GASTROINTESTINAL ENDOSCOPY  04/13/2019    Family Psychiatric History: denies  Family History:  Family History  Problem Relation Age of Onset   CAD Father        tumor between heart and lung    Hypertension Father    Cancer Father    CAD Sister        ? dx    Hypertension Mother    Lung cancer Mother        died 49    Diabetes Maternal Grandmother        late 15s    Colon cancer Neg Hx    Colon polyps Neg Hx    Esophageal cancer Neg Hx    Rectal cancer Neg Hx    Stomach cancer Neg Hx     Social History:  Social History   Socioeconomic History   Marital status: Significant Other    Spouse name: Arnell Asal   Number of children: 2   Years of education: Not on file   Highest education level: Not on file  Occupational History   Occupation: retired    Associate Professor: Geneticist, molecular  Tobacco Use   Smoking status: Former    Packs/day: 0.25    Years: 44.00    Additional pack years: 0.00     Total pack years: 11.00    Types: Cigarettes    Quit date: 02/04/2014    Years since quitting: 8.4   Smokeless tobacco: Never   Tobacco comments:    quit smoking in Dec 2015  Vaping Use   Vaping Use: Former  Substance and Sexual Activity   Alcohol use: Yes    Alcohol/week: 4.0 standard drinks of alcohol    Types: 4 Standard drinks or equivalent per week    Comment: couple times a week   Drug use: No   Sexual activity: Not Currently    Birth control/protection: Surgical    Comment: 1st intercourse 67 yo-Fewer than 5 partners  Other Topics Concern   Not on file  Social History Narrative   6-8 hours of sleep per night   Lives with her fiance hh of 2 no pets    1 dog in the home   Retired no ets  But tob 8 per day   etoh ocass 1-2    On disability from her neck surgery predicaments .   orig from [employer] in [place] area.    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 2   Last Pap 2006 last mammogram 2000 and   Has dentures   FA    Social Determinants of Health   Financial Resource Strain: Medium Risk (12/21/2020)   Overall Financial Resource Strain (CARDIA)    Difficulty of Paying Living Expenses: Somewhat hard  Food Insecurity: No Food Insecurity (12/21/2020)   Hunger Vital Sign    Worried About Running Out of Food in the Last Year: Never true    Ran Out of Food in the Last Year: Never true  Transportation Needs: No Transportation Needs (12/21/2020)   PRAPARE - Administrator, Civil Service (Medical): No    Lack of Transportation (Non-Medical): No  Physical Activity: Inactive (12/21/2020)   Exercise Vital Sign    Days of Exercise per Week: 0 days    Minutes of Exercise per Session: 0 min  Stress: No Stress Concern Present (12/21/2020)   [employer] of Occupational Health -  Occupational Stress Questionnaire    Feeling of Stress : Only a little  Social Connections: Socially Isolated (12/21/2020)   Social Connection and Isolation Panel  [NHANES]    Frequency of Communication with Friends and Family: Twice a week    Frequency of Social Gatherings with Friends and Family: Three times a week    Attends Religious Services: Never    Active Member of Clubs or Organizations: No    Attends Banker Meetings: Never    Marital Status: Divorced    Allergies:  Allergies  Allergen Reactions   Wellbutrin [Bupropion] Nausea And Vomiting    Current Medications: Current Outpatient Medications  Medication Sig Dispense Refill   albuterol (PROAIR HFA) 108 (90 Base) MCG/ACT inhaler 2 puffs every 4 hours as needed only  if your can't catch your breath 18 g 11   allopurinol (ZYLOPRIM) 100 MG tablet TAKE 1 TABLET BY MOUTH TWICE A DAY 180 tablet 1   amoxicillin-clavulanate (AUGMENTIN) 875-125 MG tablet Take 1 tablet by mouth every 12 (twelve) hours. 14 tablet 0   bisoprolol-hydrochlorothiazide (ZIAC) 5-6.25 MG tablet TAKE 1 TABLET BY MOUTH TWICE A DAY 180 tablet 2   blood glucose meter kit and supplies KIT Dispense based on patient and insurance preference. Use up to four times daily as directed. 1 each 0   Budeson-Glycopyrrol-Formoterol (BREZTRI AEROSPHERE) 160-9-4.8 MCG/ACT AERO Inhale 2 puffs into the lungs in the morning and at bedtime. 32.1 g 3   cloNIDine (CATAPRES) 0.1 MG tablet TAKE 1 TABLET BY MOUTH TWICE A DAY 60 tablet 2   Colchicine 0.6 MG CAPS Take 0.6 mg by mouth 2 (two) times daily as needed. 60 capsule 1   Glucose Blood (BLOOD GLUCOSE TEST STRIPS 333) STRP 1 strip by In Vitro route in the morning and at bedtime. 200 strip 1   HYDROcodone-acetaminophen (NORCO/VICODIN) 5-325 MG tablet Take 2 tablets by mouth every 4 (four) hours as needed. 8 tablet 0   Lancets MISC 1 Device by Does not apply route 2 (two) times daily. 200 each 1   omeprazole (PRILOSEC) 40 MG capsule Take 30- 60 min before your first and last meals of the day 60 capsule 11   rosuvastatin (CRESTOR) 10 MG tablet TAKE 1 TABLET BY MOUTH IN THE EVENING 90  tablet 1   No current facility-administered medications for this visit.     Psychiatric Specialty Exam: Review of Systems  There were no vitals taken for this visit.There is no height or weight on file to calculate BMI.  General Appearance: Fairly Groomed  Eye Contact:  Good  Speech:  Clear and Coherent  Volume:  Normal  Mood:  Euthymic  Affect:  Congruent  Thought Process:  Coherent and Linear  Orientation:  Full (Time, Place, and Person)  Thought Content: Logical   Suicidal Thoughts:  No  Homicidal Thoughts:  No  Memory:  NA  Judgement:  Fair  Insight:  Fair  Psychomotor Activity:  Normal  Concentration:  Concentration: Good  Recall:  Good  Fund of Knowledge: Good  Language: Good  Akathisia:  NA    AIMS (if indicated): not done  Assets:  Desire for Improvement Housing Social Support  ADL's:  Intact  Cognition: WNL  Sleep:  Fair   Metabolic Disorder Labs: Lab Results  Component Value Date   HGBA1C 5.7 (A) 11/13/2021   No results found for: "PROLACTIN" Lab Results  Component Value Date   CHOL 184 05/11/2021   TRIG 238 (H) 05/11/2021   HDL  58 05/11/2021   CHOLHDL 3.2 05/11/2021   VLDL 16.1 (H) 05/09/2020   LDLCALC 87 05/11/2021   LDLCALC 48 11/15/2020   Lab Results  Component Value Date   TSH 1.21 11/13/2021   TSH 3.06 05/09/2020    Therapeutic Level Labs: No results found for: "LITHIUM" No results found for: "VALPROATE" No results found for: "CBMZ"   Screenings: PHQ2-9    Flowsheet Row Office Visit from 11/13/2021 in [facility] [place] Office Visit from 10/11/2021 in [facility] Office Visit from 08/16/2021 in [facility] [place] Office Visit from 07/11/2021 in [facility] [place] Clinical Support from 12/21/2020 in [facility] [place]  PHQ-2 Total Score 1 5 4 6 1   PHQ-9 Total Score 6 19 13 20  --       Flowsheet Row ED from 07/23/2022 in [facility] at [facility] ED from 04/07/2022 in [facility] at [facility]  C-SSRS RISK CATEGORY No Risk No Risk       Collaboration of Care: Collaboration of Care: Medication Management AEB medication prescription  Patient/Guardian was advised Release of Information must be obtained prior to any record release in order to collaborate their care with an outside provider. Patient/Guardian was advised if they have not already done so to contact the registration department to sign all necessary forms in order for [place] to release information regarding their care.   Consent: Patient/Guardian gives verbal consent for treatment and assignment of benefits for services provided during this visit. Patient/Guardian expressed understanding and agreed to proceed.    Stasia Cavalier, MD 07/26/2022, 11:27 AM   Virtual Visit via Video Note  I connected with [place] on 07/26/22 at 11:00 AM EDT by a video enabled telemedicine application and verified that I am speaking with the correct person using two identifiers.  Location: Patient: Home Provider: Home Office   I discussed the limitations of evaluation and management by telemedicine and the availability of in person appointments. The patient expressed understanding and agreed to proceed.   I discussed the assessment and treatment plan with the patient. The patient was provided an opportunity to ask questions and all were answered. The patient agreed with the plan and demonstrated an understanding of the instructions.   The patient was advised to call back or seek an in-person evaluation if the symptoms worsen or if the condition fails to improve as anticipated.  30 minutes were spent in chart review, interview, psycho education, counseling, medical decision making, coordination of care and long-term prognosis.  Patient was given opportunity to ask question  and all concerns and questions were addressed and answers. Excluding separately billable services.   Stasia Cavalier, MD

## 2022-08-14 ENCOUNTER — Other Ambulatory Visit: Payer: Medicare Other

## 2022-08-29 ENCOUNTER — Ambulatory Visit: Payer: Medicare Other | Admitting: Cardiology

## 2022-09-03 ENCOUNTER — Ambulatory Visit: Payer: Medicare Other | Admitting: Orthopaedic Surgery

## 2022-09-03 ENCOUNTER — Encounter: Payer: Self-pay | Admitting: Orthopaedic Surgery

## 2022-09-03 DIAGNOSIS — M25561 Pain in right knee: Secondary | ICD-10-CM

## 2022-09-03 DIAGNOSIS — M25562 Pain in left knee: Secondary | ICD-10-CM | POA: Diagnosis not present

## 2022-09-03 DIAGNOSIS — G8929 Other chronic pain: Secondary | ICD-10-CM | POA: Diagnosis not present

## 2022-09-03 MED ORDER — METHYLPREDNISOLONE ACETATE 40 MG/ML IJ SUSP
40.0000 mg | INTRAMUSCULAR | Status: AC | PRN
Start: 2022-09-03 — End: 2022-09-03
  Administered 2022-09-03: 40 mg via INTRA_ARTICULAR

## 2022-09-03 MED ORDER — LIDOCAINE HCL 1 % IJ SOLN
3.0000 mL | INTRAMUSCULAR | Status: AC | PRN
Start: 2022-09-03 — End: 2022-09-03
  Administered 2022-09-03: 3 mL

## 2022-09-03 NOTE — Progress Notes (Signed)
The patient is a 67 year old female who comes in today with chronic bilateral knee pain with the right worse than left.  She had a recent fall last month directly on both knees.  They did obtain x-rays at urgent care of her right knee that showed no obvious fracture.  She has been wearing a knee brace on her right knee and send both knees have been aching.  She said both knees had swelling but that is improved.  Most of the pain occurs at night.  She is not taking any type of pain medicines or anti-inflammatories right now at all.  She is never had surgery on her knees and has not had any type of injections.  Both knees were examined today and have patellofemoral crepitation.  Both knees have full range of motion and are ligamentously stable.  Neither knee has an effusion.  X-rays were reviewed of the right knee from when she went to urgent care and there is no evidence of a fracture.  The joint spaces well-maintained.  There was some prepatellar soft tissue swelling that is now resolved on clinical exam.  I have recommended a steroid injection of both knees today.  She is a prediabetic but her last hemoglobin A1c was 5.7.  She agrees with this treatment plan.  I would like to see her back in 2 weeks to see how she is doing overall with her knees.  All questions and concerns were addressed and answered.  She did tolerate the steroid injections well.  Will see her back in 2 weeks but no x-rays are needed.  She does have fullness of the hamstrings on both sides and that is equal bilaterally.  Will take a look at those again in next visit on clinical exam.    Procedure Note  Patient: Samantha Hinton             Date of Birth: 08-24-1955           MRN: [id number]             Visit Date: 09/03/2022  Procedures: Visit Diagnoses:  1. Chronic pain of right knee   2. Chronic pain of left knee     Large Joint Inj: R knee on 09/03/2022 9:42 AM Indications: diagnostic evaluation and pain Details: 22 G  1.5 in needle, superolateral approach  Arthrogram: No  Medications: 3 mL lidocaine 1 %; 40 mg methylPREDNISolone acetate 40 MG/ML Outcome: tolerated well, no immediate complications Procedure, treatment alternatives, risks and benefits explained, specific risks discussed. Consent was given by the patient. Immediately prior to procedure a time out was called to verify the correct patient, procedure, equipment, support staff and site/side marked as required. Patient was prepped and draped in the usual sterile fashion.    Large Joint Inj: L knee on 09/03/2022 9:42 AM Indications: diagnostic evaluation and pain Details: 22 G 1.5 in needle, superolateral approach  Arthrogram: No  Medications: 3 mL lidocaine 1 %; 40 mg methylPREDNISolone acetate 40 MG/ML Outcome: tolerated well, no immediate complications Procedure, treatment alternatives, risks and benefits explained, specific risks discussed. Consent was given by the patient. Immediately prior to procedure a time out was called to verify the correct patient, procedure, equipment, support staff and site/side marked as required. Patient was prepped and draped in the usual sterile fashion.

## 2022-09-04 ENCOUNTER — Other Ambulatory Visit: Payer: Self-pay | Admitting: Family

## 2022-09-19 ENCOUNTER — Ambulatory Visit: Payer: Medicare Other | Admitting: Orthopaedic Surgery

## 2022-10-08 ENCOUNTER — Ambulatory Visit: Payer: Medicare Other | Admitting: Orthopaedic Surgery

## 2022-11-20 ENCOUNTER — Telehealth: Payer: Self-pay | Admitting: Internal Medicine

## 2022-11-20 NOTE — Telephone Encounter (Signed)
Pt calling in bc she needs a refill for Budeson-Glycopyrrol-Formoterol (BREZTRI AEROSPHERE) 160-9-4.8 MCG/ACT AERO  sent to  [facility]. They also have approved her for the next year.

## 2022-11-21 NOTE — Telephone Encounter (Signed)
Patient is calling for a refill of Breztri. It needs to be sent to [facility] so that she can have a year's supply. Patient would like a call back and possible some samples until her delivery.

## 2022-11-22 ENCOUNTER — Ambulatory Visit: Payer: Medicare Other | Admitting: Cardiology

## 2022-11-26 MED ORDER — BREZTRI AEROSPHERE 160-9-4.8 MCG/ACT IN AERO: 2.0000 | INHALATION_SPRAY | Freq: Two times a day (BID) | RESPIRATORY_TRACT | 9 refills | Status: DC

## 2022-11-26 NOTE — Telephone Encounter (Signed)
Prescription has been printed, awaiting to be signed. Breztri samples have been left at the front desk. Pt has been called and notified  .

## 2022-11-26 NOTE — Telephone Encounter (Signed)
PT calling again for Breztri to be called into. Seeks samples to be left up front also. She is out.  Pls call  when samples ready @ [phone]

## 2022-12-02 ENCOUNTER — Other Ambulatory Visit (HOSPITAL_COMMUNITY): Payer: Self-pay | Admitting: Psychiatry

## 2022-12-02 DIAGNOSIS — F331 Major depressive disorder, recurrent, moderate: Secondary | ICD-10-CM

## 2022-12-10 ENCOUNTER — Ambulatory Visit: Payer: Medicare Other | Admitting: Internal Medicine

## 2022-12-17 ENCOUNTER — Encounter: Payer: Self-pay | Admitting: Internal Medicine

## 2022-12-17 ENCOUNTER — Ambulatory Visit: Payer: Medicare Other | Admitting: Internal Medicine

## 2022-12-17 VITALS — BP 132/86 | HR 96 | Temp 97.8°F | Ht 65.0 in | Wt 204.2 lb

## 2022-12-17 DIAGNOSIS — J449 Chronic obstructive pulmonary disease, unspecified: Secondary | ICD-10-CM | POA: Diagnosis not present

## 2022-12-17 NOTE — Patient Instructions (Signed)
No change in ABCD plan   Please schedule a follow up visit in 12  months but call sooner if needed

## 2022-12-17 NOTE — Assessment & Plan Note (Signed)
Quit smoking 01/2014   - PFT's 06/12/2012 FEV1  1.41 (60%) ratio 64 and no better p B2  And DLCO 51 corrects to 94 % - 06/12/2012 refer to Rehab - 08/02/2012 started tudorza > improved - 06/19/2013  >  Started symbicort 2 bid  - 08/11/2018    change symb to bevespi 2 bid And max gerd rx   Alpha one AT screen 08/11/2018  MM  Level  145  - 09/10/2018   continue bevespi - 06/29/2019   change to breztri  - 05/05/2020 added pred x 6 days as Plan D  - 12/08/2021  After extensive coaching inhaler device,  effectiveness =    90% > continue breztri  - 08/2022 started study at [facility] with  injections and by 12/17/2022 "feel something is working" with less pred/ saba    Group D (now reclassified as E) in terms of symptom/risk and laba/lama/ICS  therefore appropriate rx at this point >>>  breztri and approp saba plus pred as plan D plus now in [facility] drug study with ? Biologic seems to be helping  F/u can therefore be yearly, sooner if needed          Each maintenance medication was reviewed in detail including emphasizing most importantly the difference between maintenance and prns and under what circumstances the prns are to be triggered using an action plan format where appropriate.  Total time for H and P, chart review, counseling, reviewing hfa/ neb device(s) and generating customized AVS unique to this office visit / same day charting = 21 min

## 2022-12-17 NOTE — Progress Notes (Signed)
Subjective:   Patient ID: Samantha Hinton, female    DOB: 1955/07/25 MRN: [id number]    Brief patient profile:  43  yowf MM quit smoking 01/2014  with GOLD II COPD  baseline doe x best days able to do extensive shopping s 02 better with albuterol when had access to it then admitted [facility]:   Admit Date: 02/17/2012  PRIMARY DISCHARGE DIAGNOSIS:  URI (upper respiratory infection)  COPD exacerbation  SOB (shortness of breath)  Hypoxemia  UTI (lower urinary tract infection)  Diarrhea  Tobacco abuse     08/11/2018 acute extended ov/Diane Mochizuki re:  Re-establish GOLD II copd/ worse sob still on symb 160 2bid  Chief Complaint  Patient presents with   Pulmonary Consult    Self referral. Pt c/o increased SOB since Spring 2020. She gets winded walking room to room at home.   MMRC3 = can't walk 100 yards even at a slow pace at a flat grade s stopping due to sob  Progressive worse no variability since December 2019 Hs = gurgly on back / overt hb on just prilosec 20 mg not ac No cough  Mild leg swelling   Neb but never before ex  rec Plan A = Automatic = Bevespi Take 2 puffs first thing in am and then another 2 puffs about 12 hours later.  Work on inhaler technique:   Plan B = Backup Only use your albuterol inhaler as a rescue medication to be used if you can't catch your breath Plan C = Crisis - only use your albuterol nebulizer if you first try Plan B and it fails to help > ok to use the nebulizer up to every 4 hours but if start needing it regularly call for immediate appointment Omeprazole 40 mg Take 30- 60 min before your first and last meals of the day  GERD diet       12/08/2021  f/u ov/Starsky Nanna re: GOLD 2  maint on breztri 2bid /  pred is plan D  Chief Complaint  Patient presents with   Follow-up    Increased cough and wheezing over the past wk- on pred taper and this has helped some.   Dyspnea:  wm shopping / walking to grandson's football games much better on pred Cough: none   Sleeping: 30 degrees no resp cc  SABA use: twice daily before exertion  02: none  Rec Plan  A = Automatic = Always=    Breztri Take 2 puffs first thing in am and then another 2 puffs about 12 hours later.   Plan B = Backup (to supplement plan A, not to replace it) Only use your albuterol inhaler as a rescue   Plan C = Crisis (instead of Plan B but only if Plan B stops working) - only use your albuterol nebulizer if you first try Plan B   Plan D = Deltasone when ABC not working  Prednisone 10 mg take  4 each am x 2 days,   2 each am x 2 days,  1 each am x 2 days and stop      12/17/2022  f/u ov/[place] office/Tayvion Lauder re: GOLD 2 copd  maint on breztri  / on study injections x 3 month Chief Complaint  Patient presents with   Follow-up    SOB and wheezing persistent.  Patient is in a study for COPD.  Drug taking is helping with sx.  Dyspnea:  improving even when not on prednsione Cough: none  Sleeping: 30 degrees s  resp cc  SABA use: less since study drug 02: none  Lung cancer screening: q jan    No obvious day to day or daytime variability or assoc excess/ purulent sputum or mucus plugs or hemoptysis or cp or chest tightness, subjective wheeze or overt sinus or hb symptoms.    Also denies any obvious fluctuation of symptoms with weather or environmental changes or other aggravating or alleviating factors except as outlined above   No unusual exposure hx or h/o childhood pna/ asthma or knowledge of premature birth.  Current Allergies, Complete Past Medical History, Past Surgical History, Family History, and Social History were reviewed in [employer] record.  ROS  The following are not active complaints unless bolded Hoarseness, sore throat, dysphagia, dental problems, itching, sneezing,  nasal congestion or discharge of excess mucus or purulent secretions, ear ache,   fever, chills, sweats, unintended wt loss or wt gain, classically pleuritic or  exertional cp,  orthopnea pnd or arm/hand swelling  or leg swelling, presyncope, palpitations, abdominal pain, anorexia, nausea, vomiting, diarrhea  or change in bowel habits or change in bladder habits, change in stools or change in urine, dysuria, hematuria,  rash, arthralgias, visual complaints, headache, numbness, weakness or ataxia or problems with walking or coordination,  change in mood or  memory.        Current Meds  Medication Sig   albuterol (PROAIR HFA) 108 (90 Base) MCG/ACT inhaler 2 puffs every 4 hours as needed only  if your can't catch your breath   allopurinol (ZYLOPRIM) 100 MG tablet TAKE 1 TABLET BY MOUTH TWICE A DAY   bisoprolol-hydrochlorothiazide (ZIAC) 5-6.25 MG tablet TAKE 1 TABLET BY MOUTH TWICE A DAY   blood glucose meter kit and supplies KIT Dispense based on patient and insurance preference. Use up to four times daily as directed.   Budeson-Glycopyrrol-Formoterol (BREZTRI AEROSPHERE) 160-9-4.8 MCG/ACT AERO Inhale 2 puffs into the lungs in the morning and at bedtime.   celecoxib (CELEBREX) 200 MG capsule Take 200 mg by mouth every morning.   cloNIDine (CATAPRES) 0.1 MG tablet TAKE 1 TABLET BY MOUTH TWICE A DAY   Colchicine 0.6 MG CAPS Take 0.6 mg by mouth 2 (two) times daily as needed.   gabapentin (NEURONTIN) 300 MG capsule Take 300 mg by mouth at bedtime.   Glucose Blood (BLOOD GLUCOSE TEST STRIPS 333) STRP 1 strip by In Vitro route in the morning and at bedtime.   HYDROcodone-acetaminophen (NORCO/VICODIN) 5-325 MG tablet Take 2 tablets by mouth every 4 (four) hours as needed.   Lancets MISC 1 Device by Does not apply route 2 (two) times daily.   omeprazole (PRILOSEC) 40 MG capsule Take 30- 60 min before your first and last meals of the day   OZEMPIC, 0.25 OR 0.5 MG/DOSE, 2 MG/3ML SOPN Inject 0.5 mg into the skin once a week.   rosuvastatin (CRESTOR) 10 MG tablet TAKE 1 TABLET BY MOUTH IN THE EVENING             Objective:   Physical Exam  wts  12/17/2022   204  12/08/2021 208   11/14/2020   204  11/14/2020   232 06/29/2019  224  12/29/2018  226  09/10/2018  227  08/11/2018  226  05/08/2012  Wt 195  > 199 06/12/2012 > 201 08/01/2012 > 09/23/2012  204 > 06/19/2013  199 > 07/01/2013  195 > 08/13/2014 183     04/10/12 194 lb 9.6 oz (88.27 kg)  02/20/12 190 lb  12.8 oz (86.546 kg)    Vital signs reviewed  12/17/2022  - Note at rest 02 sats  95% on RA   General appearance:    pleasant amb wf nad   HEENT :  Oropharynx  clear   Nasal turbinates nl    NECK :  without JVD/Nodes/TM/ nl carotid upstrokes bilaterally   LUNGS: no acc muscle use,  Mod barrel  contour chest wall with bilateral  Distant bs s audible wheeze and  without cough on insp or exp maneuvers and mod  Hyperresonant  to  percussion bilaterally     CV:  RRR  no s3 or murmur or increase in P2, and no edema   ABD:  soft and nontender with pos mid insp Hoover's  in the supine position. No bruits or organomegaly appreciated, bowel sounds nl  MS:   Ext warm without deformities or   obvious joint restrictions , calf tenderness, cyanosis or clubbing  SKIN: warm and dry without lesions    NEURO:  alert, approp, nl sensorium with  no motor or cerebellar deficits apparent.            I personally reviewed images and agree with radiology impression as follows:   Chest LDSCT       03/21/2022 RADS 2/ mild emphysema    Assessment & Plan:

## 2022-12-25 ENCOUNTER — Encounter: Payer: Self-pay | Admitting: Cardiovascular Disease

## 2022-12-25 ENCOUNTER — Ambulatory Visit: Payer: Medicare Other | Attending: Cardiovascular Disease | Admitting: Cardiovascular Disease

## 2022-12-25 VITALS — BP 128/86 | HR 94 | Ht 65.0 in | Wt 204.6 lb

## 2022-12-25 DIAGNOSIS — I10 Essential (primary) hypertension: Secondary | ICD-10-CM | POA: Diagnosis not present

## 2022-12-25 DIAGNOSIS — Z01812 Encounter for preprocedural laboratory examination: Secondary | ICD-10-CM | POA: Diagnosis not present

## 2022-12-25 DIAGNOSIS — R079 Chest pain, unspecified: Secondary | ICD-10-CM | POA: Insufficient documentation

## 2022-12-25 DIAGNOSIS — E782 Mixed hyperlipidemia: Secondary | ICD-10-CM

## 2022-12-25 MED ORDER — METOPROLOL TARTRATE 100 MG PO TABS: 100.0000 mg | ORAL_TABLET | Freq: Once | ORAL | 0 refills | Status: DC

## 2022-12-25 NOTE — Assessment & Plan Note (Signed)
History of hyperlipidemia on statin therapy with lipid profile performed 05/11/2021 revealing a total cholesterol 184, LDL of 87 and HDL 58.  We will recheck a fasting lipid and liver profile.

## 2022-12-25 NOTE — Assessment & Plan Note (Signed)
History of essential hypertension blood pressure measured today 128/86.  She is on Ziac.

## 2022-12-25 NOTE — Progress Notes (Signed)
12/25/2022 [name]   06/20/1955  [id number]  Primary Physician Sharmon Revere, MD Primary Cardiologist: Runell Gess MD Nicholes Calamity, [place]  HPI:  Samantha Hinton is a 67 y.o.  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  She was referred by her PCP, Dr. Delia Chimes, for evaluation of chest pressure and dyspnea.  Her risk factors include 55-pack-year history tobacco abuse having quit 9 years ago with COPD followed by Dr. Sherene Sires.  She has treated hypertension, diabetes and hyperlipidemia.  There is no family history of heart disease.  She is never had a heart attack or stroke.  She does complain of chest tightness with exertion over the last year as well as dyspnea on exertion.  She had EGD performed 11/22/2022 notable for esophagitis and gastric erosion.  She did have a biopsy for that.  She had a chest CT performed for cancer screening 03/21/2022 lipid showed moderate three-vessel coronary calcification.  She also had a 2D echocardiogram performed by her primary care physician on 09/26/2022 revealing normal LV systolic function, moderate concentric LVH.  A Zio patch revealed short runs of SVT and nonsustained VT.   Current Meds  Medication Sig   albuterol (PROAIR HFA) 108 (90 Base) MCG/ACT inhaler 2 puffs every 4 hours as needed only  if your can't catch your breath   allopurinol (ZYLOPRIM) 100 MG tablet TAKE 1 TABLET BY MOUTH TWICE A DAY   bisoprolol-hydrochlorothiazide (ZIAC) 10-6.25 MG tablet Take 1 tablet by mouth daily.   blood glucose meter kit and supplies KIT Dispense based on patient and insurance preference. Use up to four times daily as directed.   Budeson-Glycopyrrol-Formoterol (BREZTRI AEROSPHERE) 160-9-4.8 MCG/ACT AERO Inhale 2 puffs into the lungs in the morning and at bedtime.   celecoxib (CELEBREX) 200 MG capsule Take 200 mg by mouth every morning.    cloNIDine (CATAPRES) 0.1 MG tablet TAKE 1 TABLET BY MOUTH TWICE A DAY   Colchicine 0.6 MG CAPS Take 0.6 mg by mouth 2 (two) times daily as needed.   gabapentin (NEURONTIN) 300 MG capsule Take 300 mg by mouth at bedtime.   Glucose Blood (BLOOD GLUCOSE TEST STRIPS 333) STRP 1 strip by In Vitro route in the morning and at bedtime.   Lancets MISC 1 Device by Does not apply route 2 (two) times daily.   metoprolol tartrate (LOPRESSOR) 100 MG tablet Take 1 tablet (100 mg total) by mouth once for 1 dose. Take 2 hrs prior to cardiac testing   omeprazole (PRILOSEC) 40 MG capsule Take 30- 60 min before your first and last meals of the day   OZEMPIC, 0.25 OR 0.5 MG/DOSE, 2 MG/3ML SOPN Inject 0.5 mg into the skin once a week.   rosuvastatin (CRESTOR) 10 MG tablet TAKE 1 TABLET BY MOUTH IN THE EVENING     Allergies  Allergen Reactions   Wellbutrin [Bupropion] Nausea And Vomiting    Social History   Socioeconomic History   Marital status: Significant Other    Spouse name: Arnell Asal   Number of children: 2   Years of education: Not on file   Highest education level: Not on file  Occupational History   Occupation: retired    Associate Professor: [name]  Tobacco Use   Smoking status: Former    Current packs/day: 0.00    Average packs/day: 0.3 packs/day for 44.0 years (11.0 ttl pk-yrs)  Types: Cigarettes    Start date: 02/04/1970    Quit date: 02/04/2014    Years since quitting: 8.8   Smokeless tobacco: Never   Tobacco comments:    quit smoking in Dec 2015  Vaping Use   Vaping status: Former  Substance and Sexual Activity   Alcohol use: Yes    Alcohol/week: 4.0 standard drinks of alcohol    Types: 4 Standard drinks or equivalent per week    Comment: couple times a week   Drug use: No   Sexual activity: Not Currently    Birth control/protection: Surgical    Comment: 1st intercourse 67 yo-Fewer than 5 partners  Other Topics Concern   Not on file  Social History Narrative   6-8  hours of sleep per night   Lives with her fiance hh of 2 no pets    1 dog in the home   Retired no ets  But tob 8 per day   etoh ocass 1-2    On disability from her neck surgery predicaments .   orig from [employer] in [place] area.    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 2   Last Pap 2006 last mammogram 2000 and   Has dentures   FA    Social Determinants of Health   Financial Resource Strain: Medium Risk (12/21/2020)   Overall Financial Resource Strain (CARDIA)    Difficulty of Paying Living Expenses: Somewhat hard  Food Insecurity: No Food Insecurity (12/21/2020)   Hunger Vital Sign    Worried About Running Out of Food in the Last Year: Never true    Ran Out of Food in the Last Year: Never true  Transportation Needs: No Transportation Needs (12/21/2020)   PRAPARE - Administrator, Civil Service (Medical): No    Lack of Transportation (Non-Medical): No  Physical Activity: Inactive (12/21/2020)   Exercise Vital Sign    Days of Exercise per Week: 0 days    Minutes of Exercise per Session: 0 min  Stress: No Stress Concern Present (12/21/2020)   [employer] of Occupational Health - Occupational Stress Questionnaire    Feeling of Stress : Only a little  Social Connections: Unknown (09/14/2021)   Received from [facility], [facility]   Social Network    Social Network: Not on file  Intimate Partner Violence: Unknown (09/14/2021)   Received from [facility], [facility]   HITS    Physically Hurt: Not on file    Insult or Talk Down To: Not on file    Threaten Physical Harm: Not on file    Scream or Curse: Not on file     Review of Systems: General: negative for chills, fever, night sweats or weight changes.  Cardiovascular: negative for chest pain, dyspnea on exertion, edema, orthopnea, palpitations, paroxysmal nocturnal dyspnea or shortness of breath Dermatological: negative for rash Respiratory: negative for cough or  wheezing Urologic: negative for hematuria Abdominal: negative for nausea, vomiting, diarrhea, bright red blood per rectum, melena, or hematemesis Neurologic: negative for visual changes, syncope, or dizziness All other systems reviewed and are otherwise negative except as noted above.    Blood pressure 128/86, pulse 94, height 5\' 5"  (1.651 m), weight 204 lb 9.6 oz (92.8 kg), SpO2 91%.  General appearance: alert and no distress Neck: no adenopathy, no carotid bruit, no JVD, supple, symmetrical, trachea midline, and thyroid not enlarged, symmetric, no tenderness/mass/nodules Lungs: clear to auscultation bilaterally Heart: regular rate and  rhythm, S1, S2 normal, no murmur, click, rub or gallop Extremities: extremities normal, atraumatic, no cyanosis or edema Pulses: 2+ and symmetric Skin: Skin color, texture, turgor normal. No rashes or lesions Neurologic: Grossly normal  EKG not performed today      ASSESSMENT AND PLAN:   Essential hypertension, benign History of essential hypertension blood pressure measured today 128/86.  She is on Ziac.  Hyperlipidemia History of hyperlipidemia on statin therapy with lipid profile performed 05/11/2021 revealing a total cholesterol 184, LDL of 87 and HDL 58.  We will recheck a fasting lipid and liver profile.  Chest pain of uncertain etiology Patient has complained of exertional chest pressure for the last year.  She also has chronic dyspnea most likely from COPD but potentially other causes.  She does have risk factors include treated hypertension, diabetes and hyperlipidemia as well as greater than 50-pack-year history tobacco abuse having quit 9 years ago.  She had a chest CT performed 03/21/2022 that did show moderate three-vessel coronary calcification.  I am concerned that her symptoms may be ischemically mediated.  I am going to get a coronary CTA to further evaluate.     Runell Gess MD FACP,FACC,FAHA, [facility] 12/25/2022 11:36 AM

## 2022-12-25 NOTE — Addendum Note (Signed)
Addended by: Shade Flood R on: 12/25/2022 11:40 AM   Modules accepted: Orders

## 2022-12-25 NOTE — Assessment & Plan Note (Signed)
Patient has complained of exertional chest pressure for the last year.  She also has chronic dyspnea most likely from COPD but potentially other causes.  She does have risk factors include treated hypertension, diabetes and hyperlipidemia as well as greater than 50-pack-year history tobacco abuse having quit 9 years ago.  She had a chest CT performed 03/21/2022 that did show moderate three-vessel coronary calcification.  I am concerned that her symptoms may be ischemically mediated.  I am going to get a coronary CTA to further evaluate.

## 2022-12-25 NOTE — Patient Instructions (Addendum)
Medication Instructions:  NO CHANGES   *If you need a refill on your cardiac medications before your next appointment, please call your pharmacy*   Lab Work: Your physician recommends that you return for lab work in 1 WEEK FOR; BMET LIPID PANEL LIVER FUNCTION TEST   If you have labs (blood work) drawn today and your tests are completely normal, you will receive your results only by: MyChart Message (if you have MyChart) OR A paper copy in the mail If you have any lab test that is abnormal or we need to change your treatment, we will call you to review the results.   Testing/Procedures:   Your cardiac CT will be scheduled at one of the below locations:   [facility] [address] [place], [place] [address] [phone]   If scheduled at [facility], please arrive at the [facility] and Children's Entrance (Entrance C2) of [facility] 30 minutes prior to test start time. You can use the FREE valet parking offered at entrance C (encouraged to control the heart rate for the test)  Proceed to the [facility] Radiology Department (first floor) to check-in and test prep.  All radiology patients and guests should use entrance C2 at [facility], accessed from [facility], even though the hospital's physical address listed is [address]     There is spacious parking and easy access to the radiology department from the [facility] entrance. Please enter here and check-in with the desk attendant.   Please follow these instructions carefully (unless otherwise directed):  An IV will be required for this test and Nitroglycerin will be given.  Hold all erectile dysfunction medications at least 3 days (72 hrs) prior to test. (Ie viagra, cialis, sildenafil, tadalafil, etc)   On the Night Before the Test: Be sure to Drink plenty of water. Do not consume any caffeinated/decaffeinated beverages or chocolate 12 hours  prior to your test. Do not take any antihistamines 12 hours prior to your test.   On the Day of the Test: Drink plenty of water until 1 hour prior to the test. Do not eat any food 1 hour prior to test. You may take your regular medications prior to the test.  Take metoprolol (Lopressor) two hours prior to test. If you take Furosemide/Hydrochlorothiazide/Spironolactone, please HOLD on the morning of the test. FEMALES- please wear underwire-free bra if available, avoid dresses & tight clothing       After the Test: Drink plenty of water. After receiving IV contrast, you may experience a mild flushed feeling. This is normal. On occasion, you may experience a mild rash up to 24 hours after the test. This is not dangerous. If this occurs, you can take Benadryl 25 mg and increase your fluid intake. If you experience trouble breathing, this can be serious. If it is severe call 911 IMMEDIATELY. If it is mild, please call our office. If you take any of these medications: Glipizide/Metformin, Avandament, Glucavance, please do not take 48 hours after completing test unless otherwise instructed.  We will call to schedule your test 2-4 weeks out understanding that some insurance companies will need an authorization prior to the service being performed.   For more information and frequently asked questions, please visit our website : [email or web address]  For non-scheduling related questions, please contact the cardiac imaging nurse navigator should you have any questions/concerns: Cardiac Imaging Nurse Navigators Direct Office Dial: [phone]   For scheduling needs, including  cancellations and rescheduling, please call [place], [phone].    Follow-Up: At [facility], you and your health needs are our priority.  As part of our continuing mission to provide you with exceptional heart care, we have created designated Provider Care Teams.  These Care Teams include your primary  Cardiologist (physician) and Advanced Practice Providers (APPs -  Physician Assistants and Nurse Practitioners) who all work together to provide you with the care you need, when you need it.  We recommend signing up for the patient portal called "MyChart".  Sign up information is provided on this After Visit Summary.  MyChart is used to connect with patients for Virtual Visits (Telemedicine).  Patients are able to view lab/test results, encounter notes, upcoming appointments, etc.  Non-urgent messages can be sent to your provider as well.   To learn more about what you can do with MyChart, go to [email or web address].    Your next appointment:   Follow up as needed pending results of cardiac testing    Provider:   Dr. Burna Mortimer, MD   Other Instructions

## 2022-12-28 LAB — BASIC METABOLIC PANEL
BUN/Creatinine Ratio: 27 (ref 12–28)
BUN: 19 mg/dL (ref 8–27)
CO2: 32 mmol/L — ABNORMAL HIGH (ref 20–29)
Calcium: 9.7 mg/dL (ref 8.7–10.3)
Chloride: 95 mmol/L — ABNORMAL LOW (ref 96–106)
Creatinine, Ser: 0.71 mg/dL (ref 0.57–1.00)
Glucose: 105 mg/dL — ABNORMAL HIGH (ref 70–99)
Potassium: 3.8 mmol/L (ref 3.5–5.2)
Sodium: 141 mmol/L (ref 134–144)
eGFR: 93 mL/min/{1.73_m2} (ref >59)

## 2022-12-30 ENCOUNTER — Other Ambulatory Visit: Payer: Self-pay | Admitting: Family

## 2023-01-02 ENCOUNTER — Other Ambulatory Visit (HOSPITAL_COMMUNITY): Payer: Self-pay | Admitting: *Deleted

## 2023-01-02 ENCOUNTER — Ambulatory Visit (HOSPITAL_COMMUNITY)
Admission: RE | Admit: 2023-01-02 | Discharge: 2023-01-02 | Disposition: A | Discharge status: Home / Self Care | Payer: Medicare Other | Source: Ambulatory Visit | Attending: Cardiovascular Disease | Admitting: Cardiovascular Disease

## 2023-01-02 DIAGNOSIS — R079 Chest pain, unspecified: Secondary | ICD-10-CM

## 2023-01-02 MED ORDER — NITROGLYCERIN 0.4 MG SL SUBL: 0.8000 mg | SUBLINGUAL_TABLET | Freq: Once | SUBLINGUAL | Status: DC

## 2023-01-02 MED ORDER — METOPROLOL TARTRATE 100 MG PO TABS: 100.0000 mg | ORAL_TABLET | Freq: Once | ORAL | 0 refills | Status: DC

## 2023-01-02 NOTE — Progress Notes (Signed)
Patient ID: Samantha Hinton, female   DOB: June 19, 1955, 67 y.o.   MRN: [id number] pt heart rate 96. Pt very anxious . Came week early for heart ct. Given option of medication for heart rate or rescheduling CT heart. Pt wants to reschedule CT heart. Pt discharged home

## 2023-01-07 ENCOUNTER — Telehealth (HOSPITAL_COMMUNITY): Payer: Self-pay | Admitting: *Deleted

## 2023-01-07 NOTE — Telephone Encounter (Signed)
Reaching out to patient to offer assistance regarding upcoming cardiac imaging study; pt verbalizes understanding of appt date/time, parking situation and where to check in, pre-test NPO status and medications ordered, and verified current allergies; name and call back number provided for further questions should they arise  Larey Brick RN Navigator Cardiac Imaging [facility] [phone] office [phone] cell  Patient to take 100mg  metoprolol tartrate two hours prior to her cardiac CT scan. She intends to take ativan and is aware she will need a ride. She will arrive at 11 AM.

## 2023-01-09 ENCOUNTER — Ambulatory Visit (HOSPITAL_COMMUNITY): Admission: RE | Admit: 2023-01-09 | Payer: Medicare Other | Source: Ambulatory Visit

## 2023-01-24 ENCOUNTER — Ambulatory Visit (HOSPITAL_COMMUNITY): Payer: Medicare Other

## 2023-02-07 ENCOUNTER — Other Ambulatory Visit: Payer: Self-pay

## 2023-02-07 ENCOUNTER — Encounter (HOSPITAL_BASED_OUTPATIENT_CLINIC_OR_DEPARTMENT_OTHER): Payer: Self-pay

## 2023-02-07 ENCOUNTER — Other Ambulatory Visit: Payer: Self-pay | Admitting: Acute Care

## 2023-02-07 ENCOUNTER — Emergency Department (HOSPITAL_BASED_OUTPATIENT_CLINIC_OR_DEPARTMENT_OTHER): Payer: Medicare Other

## 2023-02-07 ENCOUNTER — Emergency Department (HOSPITAL_BASED_OUTPATIENT_CLINIC_OR_DEPARTMENT_OTHER)
Admission: EM | Admit: 2023-02-07 | Discharge: 2023-02-07 | Disposition: A | Discharge status: Home / Self Care | Payer: Medicare Other | Attending: Emergency Medicine | Admitting: Emergency Medicine

## 2023-02-07 DIAGNOSIS — Z20822 Contact with and (suspected) exposure to covid-19: Secondary | ICD-10-CM | POA: Insufficient documentation

## 2023-02-07 DIAGNOSIS — J209 Acute bronchitis, unspecified: Secondary | ICD-10-CM | POA: Insufficient documentation

## 2023-02-07 DIAGNOSIS — Z79899 Other long term (current) drug therapy: Secondary | ICD-10-CM | POA: Insufficient documentation

## 2023-02-07 DIAGNOSIS — R0602 Shortness of breath: Secondary | ICD-10-CM | POA: Diagnosis present

## 2023-02-07 DIAGNOSIS — J441 Chronic obstructive pulmonary disease with (acute) exacerbation: Secondary | ICD-10-CM | POA: Diagnosis not present

## 2023-02-07 DIAGNOSIS — I10 Essential (primary) hypertension: Secondary | ICD-10-CM | POA: Insufficient documentation

## 2023-02-07 DIAGNOSIS — Z8673 Personal history of transient ischemic attack (TIA), and cerebral infarction without residual deficits: Secondary | ICD-10-CM | POA: Diagnosis not present

## 2023-02-07 DIAGNOSIS — Z7952 Long term (current) use of systemic steroids: Secondary | ICD-10-CM | POA: Insufficient documentation

## 2023-02-07 DIAGNOSIS — R600 Localized edema: Secondary | ICD-10-CM | POA: Insufficient documentation

## 2023-02-07 DIAGNOSIS — Z122 Encounter for screening for malignant neoplasm of respiratory organs: Secondary | ICD-10-CM

## 2023-02-07 DIAGNOSIS — Z87891 Personal history of nicotine dependence: Secondary | ICD-10-CM

## 2023-02-07 LAB — RESP PANEL BY RT-PCR (RSV, FLU A&B, COVID)  RVPGX2
Influenza A by PCR: NEGATIVE
Influenza B by PCR: NEGATIVE
Resp Syncytial Virus by PCR: NEGATIVE
SARS Coronavirus 2 by RT PCR: NEGATIVE

## 2023-02-07 LAB — URINALYSIS, ROUTINE W REFLEX MICROSCOPIC
Bilirubin Urine: NEGATIVE
Glucose, UA: NEGATIVE mg/dL
Hgb urine dipstick: NEGATIVE
Ketones, ur: NEGATIVE mg/dL
Leukocytes,Ua: NEGATIVE
Nitrite: NEGATIVE
Protein, ur: NEGATIVE mg/dL
Specific Gravity, Urine: 1.012 (ref 1.005–1.030)
pH: 6.5 (ref 5.0–8.0)

## 2023-02-07 LAB — TROPONIN I (HIGH SENSITIVITY): Troponin I (High Sensitivity): 4 ng/L

## 2023-02-07 LAB — CBC
HCT: 37.7 % (ref 36.0–46.0)
Hemoglobin: 12 g/dL (ref 12.0–15.0)
MCH: 28.2 pg (ref 26.0–34.0)
MCHC: 31.8 g/dL (ref 30.0–36.0)
MCV: 88.5 fL (ref 80.0–100.0)
Platelets: 249 10*3/uL (ref 150–400)
RBC: 4.26 MIL/uL (ref 3.87–5.11)
RDW: 15.9 % — ABNORMAL HIGH (ref 11.5–15.5)
WBC: 10.3 10*3/uL (ref 4.0–10.5)
nRBC: 0 % (ref 0.0–0.2)

## 2023-02-07 LAB — MAGNESIUM: Magnesium: 1.8 mg/dL (ref 1.7–2.4)

## 2023-02-07 LAB — BASIC METABOLIC PANEL WITH GFR
Anion gap: 12 (ref 5–15)
BUN: 18 mg/dL (ref 8–23)
CO2: 32 mmol/L (ref 22–32)
Calcium: 9.1 mg/dL (ref 8.9–10.3)
Chloride: 94 mmol/L — ABNORMAL LOW (ref 98–111)
Creatinine, Ser: 0.84 mg/dL (ref 0.44–1.00)
GFR, Estimated: >60 mL/min
Glucose, Bld: 125 mg/dL — ABNORMAL HIGH (ref 70–99)
Potassium: 4.3 mmol/L (ref 3.5–5.1)
Sodium: 138 mmol/L (ref 135–145)

## 2023-02-07 LAB — BRAIN NATRIURETIC PEPTIDE: B Natriuretic Peptide: 97.2 pg/mL (ref 0.0–100.0)

## 2023-02-07 MED ORDER — PREDNISONE 50 MG PO TABS
50.0000 mg | ORAL_TABLET | Freq: Every day | ORAL | 0 refills | Status: DC
Start: 2023-02-07 — End: 2023-04-09

## 2023-02-07 MED ORDER — DOXYCYCLINE HYCLATE 100 MG PO CAPS: 100.0000 mg | ORAL_CAPSULE | Freq: Two times a day (BID) | ORAL | 0 refills | Status: DC

## 2023-02-07 MED ORDER — IPRATROPIUM-ALBUTEROL 0.5-2.5 (3) MG/3ML IN SOLN: 3.0000 mL | RESPIRATORY_TRACT | 0 refills | Status: AC | PRN

## 2023-02-07 MED ORDER — FUROSEMIDE 20 MG PO TABS: 20.0000 mg | ORAL_TABLET | Freq: Every day | ORAL | 0 refills | Status: AC

## 2023-02-07 MED ORDER — POTASSIUM CHLORIDE CRYS ER 20 MEQ PO TBCR: 10.0000 meq | EXTENDED_RELEASE_TABLET | Freq: Every day | ORAL | Status: DC

## 2023-02-07 MED ORDER — METHYLPREDNISOLONE SODIUM SUCC 125 MG IJ SOLR
125.0000 mg | Freq: Once | INTRAMUSCULAR | Status: AC
  Administered 2023-02-07: 125 mg via INTRAVENOUS
  Filled 2023-02-07: qty 2

## 2023-02-07 MED ORDER — DOXYCYCLINE HYCLATE 100 MG PO TABS
100.0000 mg | ORAL_TABLET | Freq: Once | ORAL | Status: AC
  Administered 2023-02-07: 100 mg via ORAL
  Filled 2023-02-07: qty 1

## 2023-02-07 MED ORDER — IPRATROPIUM-ALBUTEROL 0.5-2.5 (3) MG/3ML IN SOLN
3.0000 mL | Freq: Once | RESPIRATORY_TRACT | Status: AC
  Administered 2023-02-07: 3 mL via RESPIRATORY_TRACT
  Filled 2023-02-07: qty 3

## 2023-02-07 MED ORDER — FUROSEMIDE 10 MG/ML IJ SOLN
20.0000 mg | Freq: Once | INTRAMUSCULAR | Status: AC
  Administered 2023-02-07: 20 mg via INTRAVENOUS
  Filled 2023-02-07: qty 2

## 2023-02-07 MED ORDER — POTASSIUM CHLORIDE ER 10 MEQ PO TBCR: 10.0000 meq | EXTENDED_RELEASE_TABLET | Freq: Every day | ORAL | 0 refills | Status: AC

## 2023-02-07 NOTE — ED Notes (Signed)
RT ambulated PT, SpO2 stayed above 93%... HR stayed around 80.Marland KitchenMarland Kitchen

## 2023-02-07 NOTE — ED Provider Notes (Signed)
Received handoff; disposition pending urine and chest x-ray read.  Patient is feeling improved. Physical Exam  BP 128/60   Pulse 85   Temp 98.9 F (37.2 C)   Resp 20   SpO2 95%   Physical Exam Vitals reviewed.  Constitutional:      Appearance: She is obese.  Cardiovascular:     Rate and Rhythm: Normal rate and regular rhythm.  Pulmonary:     Effort: Pulmonary effort is normal.     Breath sounds: Normal breath sounds.  Abdominal:     Palpations: Abdomen is soft.  Musculoskeletal:        General: Normal range of motion.     Right lower leg: Edema present.     Left lower leg: Edema present.  Skin:    General: Skin is warm.  Neurological:     General: No focal deficit present.     Mental Status: She is alert.  Psychiatric:        Mood and Affect: Mood normal.        Behavior: Behavior normal.     Procedures  Procedures  ED Course / MDM    Medical Decision Making Patient seen primarily by morning team.  See their note for full HPI.  She continues to feel okay.  Maintaining ox saturation on room air.  UA negative for UTI.  Chest x-ray without acute abnormality.  Patient has medications ordered by prior provider.  Stable for discharge at this time.  Amount and/or Complexity of Data Reviewed Labs: ordered. Radiology: ordered.  Risk Prescription drug management.         Coral Spikes, DO 02/07/23 1715

## 2023-02-07 NOTE — ED Provider Notes (Signed)
EMERGENCY DEPARTMENT AT [facility] Provider Note   CSN: [id number] Arrival date & time: 02/07/23  1319     History  Chief Complaint  Patient presents with   Shortness of Breath   Leg Swelling    Samantha Hinton is a 67 y.o. female.  Pt is a 67 yo female with pmhx significant for COPD, HTN, HLD, CVA, fibromyalgia, GERD, and anxiety.  Pt has been sick for 3 weeks.  She has been more sob and has been using her nebs frequently.  She has also had some swelling in her ankles and feet.  Pt said she it took her 2 hours to unload her groceries due to sob.         Home Medications Prior to Admission medications   Medication Sig Start Date End Date Taking? Authorizing Provider  doxycycline (VIBRAMYCIN) 100 MG capsule Take 1 capsule (100 mg total) by mouth 2 (two) times daily. 02/07/23  Yes Jacalyn Lefevre, MD  furosemide (LASIX) 20 MG tablet Take 1 tablet (20 mg total) by mouth daily. 02/07/23  Yes Jacalyn Lefevre, MD  ipratropium-albuterol (DUONEB) 0.5-2.5 (3) MG/3ML SOLN Take 3 mLs by nebulization every 4 (four) hours as needed. 02/07/23  Yes Jacalyn Lefevre, MD  potassium chloride (KLOR-CON) 10 MEQ tablet Take 1 tablet (10 mEq total) by mouth daily. 02/07/23  Yes Jacalyn Lefevre, MD  predniSONE (DELTASONE) 50 MG tablet Take 1 tablet (50 mg total) by mouth daily with breakfast. 02/07/23  Yes Jacalyn Lefevre, MD  albuterol [facility] HFA) 108 (90 Base) MCG/ACT inhaler 2 puffs every 4 hours as needed only  if your can't catch your breath 05/05/20   Nyoka Cowden, MD  allopurinol (ZYLOPRIM) 100 MG tablet TAKE 1 TABLET BY MOUTH TWICE A DAY 06/28/22   Worthy Rancher B, FNP  bisoprolol-hydrochlorothiazide (ZIAC) 10-6.25 MG tablet Take 1 tablet by mouth daily.    [provider]  bisoprolol-hydrochlorothiazide (ZIAC) 5-6.25 MG tablet TAKE 1 TABLET BY MOUTH TWICE A DAY Patient not taking: Reported on 12/25/2022 05/22/22   Eulis Foster, FNP  blood glucose meter kit and  supplies KIT Dispense based on patient and insurance preference. Use up to four times daily as directed. 07/13/21   Whitmire, Dawn, FNP  Budeson-Glycopyrrol-Formoterol (BREZTRI AEROSPHERE) 160-9-4.8 MCG/ACT AERO Inhale 2 puffs into the lungs in the morning and at bedtime. 11/26/22   Cobb, Ruby Cola, NP  celecoxib (CELEBREX) 200 MG capsule Take 200 mg by mouth every morning. 11/26/22   [provider]  cloNIDine (CATAPRES) 0.1 MG tablet TAKE 1 TABLET BY MOUTH TWICE A DAY 05/27/22   Worthy Rancher B, FNP  Colchicine 0.6 MG CAPS Take 0.6 mg by mouth 2 (two) times daily as needed. 08/11/19   Nadara Mustard, MD  gabapentin (NEURONTIN) 300 MG capsule Take 300 mg by mouth at bedtime. 11/07/22   [provider]  Glucose Blood (BLOOD GLUCOSE TEST STRIPS 333) STRP 1 strip by In Vitro route in the morning and at bedtime. 07/13/21   Whitmire, Dawn, FNP  HYDROcodone-acetaminophen (NORCO/VICODIN) 5-325 MG tablet Take 2 tablets by mouth every 4 (four) hours as needed. Patient not taking: Reported on 12/25/2022 07/23/22   Gareth Eagle, PA-C  Lancets MISC 1 Device by Does not apply route 2 (two) times daily. 07/13/21   Whitmire, Dawn, FNP  metoprolol tartrate (LOPRESSOR) 100 MG tablet Take 1 tablet (100 mg total) by mouth once for 1 dose. Take 2 hrs prior to cardiac testing 01/02/23 01/02/23  Runell Gess, MD  omeprazole (PRILOSEC) 40 MG capsule Take 30- 60 min before your first and last meals of the day 12/08/21   Nyoka Cowden, MD  OZEMPIC, 0.25 OR 0.5 MG/DOSE, 2 MG/3ML SOPN Inject 0.5 mg into the skin once a week. 11/23/22   [provider]  rosuvastatin (CRESTOR) 10 MG tablet TAKE 1 TABLET BY MOUTH IN THE EVENING 04/18/22   Panosh, Neta Mends, MD      Allergies    Wellbutrin [bupropion]    Review of Systems   Review of Systems  Respiratory:  Positive for shortness of breath.   Cardiovascular:  Positive for leg swelling.  All other systems reviewed and are negative.   Physical  Exam Updated Vital Signs BP 128/60   Pulse 85   Temp 98.9 F (37.2 C)   Resp 20   SpO2 95%  Physical Exam Vitals and nursing note reviewed.  Constitutional:      Appearance: She is well-developed.  HENT:     Head: Normocephalic and atraumatic.     Mouth/Throat:     Mouth: Mucous membranes are moist.     Pharynx: Oropharynx is clear.  Eyes:     Extraocular Movements: Extraocular movements intact.     Pupils: Pupils are equal, round, and reactive to light.  Cardiovascular:     Rate and Rhythm: Normal rate and regular rhythm.  Pulmonary:     Effort: Tachypnea present.     Breath sounds: Wheezing present.  Abdominal:     General: Bowel sounds are normal.     Palpations: Abdomen is soft.  Musculoskeletal:     Cervical back: Normal range of motion and neck supple.     Right lower leg: Edema present.     Left lower leg: Edema present.  Skin:    General: Skin is warm.     Capillary Refill: Capillary refill takes less than 2 seconds.  Neurological:     General: No focal deficit present.     Mental Status: She is alert and oriented to person, place, and time.  Psychiatric:        Mood and Affect: Mood normal.        Behavior: Behavior normal.     ED Results / Procedures / Treatments   Labs (all labs ordered are listed, but only abnormal results are displayed) Labs Reviewed  BASIC METABOLIC PANEL - Abnormal; Notable for the following components:      Result Value   Chloride 94 (*)    Glucose, Bld 125 (*)    All other components within normal limits  CBC - Abnormal; Notable for the following components:   RDW 15.9 (*)    All other components within normal limits  RESP PANEL BY RT-PCR (RSV, FLU A&B, COVID)  RVPGX2  BRAIN NATRIURETIC PEPTIDE  MAGNESIUM  URINALYSIS, ROUTINE W REFLEX MICROSCOPIC  TROPONIN I (HIGH SENSITIVITY)    EKG EKG Interpretation Date/Time:  Thursday February 07 2023 13:30:11 EST Ventricular Rate:  93 PR Interval:  162 QRS Duration:  82 QT  Interval:  398 QTC Calculation: 494 R Axis:   60  Text Interpretation: Normal sinus rhythm Nonspecific ST abnormality Prolonged QT Abnormal ECG No previous ECGs available No significant change since last tracing Confirmed by Jacalyn Lefevre [phone]) on 02/07/2023 1:52:09 PM  Radiology No results found.  Procedures Procedures    Medications Ordered in ED Medications  doxycycline (VIBRA-TABS) tablet 100 mg (has no administration in time range)  ipratropium-albuterol (DUONEB) 0.5-2.5 (  3) MG/3ML nebulizer solution 3 mL (3 mLs Nebulization Given 02/07/23 1348)  methylPREDNISolone sodium succinate (SOLU-MEDROL) 125 mg/2 mL injection 125 mg (125 mg Intravenous Given 02/07/23 1417)  furosemide (LASIX) injection 20 mg (20 mg Intravenous Given 02/07/23 1417)    ED Course/ Medical Decision Making/ A&P                                 Medical Decision Making Amount and/or Complexity of Data Reviewed Labs: ordered. Radiology: ordered.  Risk Prescription drug management.   This patient presents to the ED for concern of sob, this involves an extensive number of treatment options, and is a complaint that carries with it a high risk of complications and morbidity.  The differential diagnosis includes copd exac, chf, pna, bronchitis, covid/flu/rsv   Co morbidities that complicate the patient evaluation  COPD, HTN, HLD, CVA, fibromyalgia, GERD, and anxiety   Additional history obtained:  Additional history obtained from epic chart review  Lab Tests:  I Ordered, and personally interpreted labs.  The pertinent results include:  cbc nl, mg nl, bmp nl, bnp nl, trop nl, covid/flu/rsv neg   Imaging Studies ordered:  I ordered imaging studies including cxr  Pending at d/c   Cardiac Monitoring:  The patient was maintained on a cardiac monitor.  I personally viewed and interpreted the cardiac monitored which showed an underlying rhythm of: nsr   Medicines ordered and prescription drug  management:  I ordered medication including nebs/solumedrol/lasix  for sx  Reevaluation of the patient after these medicines showed that the patient improved I have reviewed the patients home medicines and have made adjustments as needed   Test Considered:  cxr   Critical Interventions:  nebs   Problem List / ED Course:  COPD exacerbation:  pt is feeling much better after nebs and steroids.  Pt will be d/c with doxy as sx have been going on for 3 weeks. She is neg for covid/flu.  She is able to ambulate with O2 sats 92-93.  She is stable for d/c.  Return if worse.  Peripheral edema:  pt does have hydrochlorothiazide in her bp meds, but it is only 6.25.  She is d/c with lasix and kdur to help with swelling.    Reevaluation:  After the interventions noted above, I reevaluated the patient and found that they have :improved   Social Determinants of Health:  Lives at home   Dispostion:  After consideration of the diagnostic results and the patients response to treatment, I feel that the patent would benefit from discharge with outpatient f/u.          Final Clinical Impression(s) / ED Diagnoses Final diagnoses:  COPD exacerbation (HCC)  Acute bronchitis, unspecified organism  Peripheral edema    Rx / DC Orders ED Discharge Orders          Ordered    predniSONE (DELTASONE) 50 MG tablet  Daily with breakfast        02/07/23 1524    doxycycline (VIBRAMYCIN) 100 MG capsule  2 times daily        02/07/23 1524    furosemide (LASIX) 20 MG tablet  Daily        02/07/23 1525    potassium chloride (KLOR-CON) 10 MEQ tablet  Daily        02/07/23 1526    ipratropium-albuterol (DUONEB) 0.5-2.5 (3) MG/3ML SOLN  Every 4 hours PRN  02/07/23 1531              Jacalyn Lefevre, MD 02/07/23 [phone]

## 2023-02-07 NOTE — ED Notes (Signed)
Discharge paperwork given and verbally understood. 

## 2023-02-07 NOTE — ED Notes (Addendum)
RT Note: Patient on room air while at rest : 97%, HR 82, RR 18 Patient on room air while ambulating: 93% , HR 102, RR 22  Patient had a little increased WOB but tolerated the walk well.

## 2023-02-07 NOTE — ED Triage Notes (Signed)
Pt c/o URI symptoms x3wks, "now just a sour belly." States she's had to use rescue inhaler, neb treatments more frequently. Pt states she's had more swelling in ankles/ feet.  Hx COPD, states she was unable to complete CT coronary r/t intolerance.

## 2023-02-08 ENCOUNTER — Ambulatory Visit (HOSPITAL_COMMUNITY): Payer: Medicare Other

## 2023-02-19 ENCOUNTER — Other Ambulatory Visit: Payer: Self-pay | Admitting: Family Medicine

## 2023-02-19 DIAGNOSIS — Z1231 Encounter for screening mammogram for malignant neoplasm of breast: Secondary | ICD-10-CM

## 2023-02-28 ENCOUNTER — Telehealth (HOSPITAL_COMMUNITY): Payer: Self-pay | Admitting: *Deleted

## 2023-02-28 NOTE — Telephone Encounter (Signed)
 Attempted to call patient regarding upcoming cardiac CT appointment. Left message on voicemail with name and callback number Johney Frame RN Navigator Cardiac Imaging [facility] [phone] Office

## 2023-03-01 ENCOUNTER — Other Ambulatory Visit (HOSPITAL_COMMUNITY): Payer: Self-pay | Admitting: Emergency Medicine

## 2023-03-01 ENCOUNTER — Ambulatory Visit (HOSPITAL_COMMUNITY)
Admission: RE | Admit: 2023-03-01 | Discharge: 2023-03-01 | Disposition: A | Discharge status: Home / Self Care | Payer: Medicare Other | Source: Ambulatory Visit | Attending: Cardiovascular Disease | Admitting: Cardiovascular Disease

## 2023-03-01 ENCOUNTER — Ambulatory Visit (HOSPITAL_BASED_OUTPATIENT_CLINIC_OR_DEPARTMENT_OTHER)
Admission: RE | Admit: 2023-03-01 | Discharge: 2023-03-01 | Disposition: A | Discharge status: Home / Self Care | Payer: Medicare Other | Source: Ambulatory Visit | Attending: Cardiology | Admitting: Cardiology

## 2023-03-01 DIAGNOSIS — I6523 Occlusion and stenosis of bilateral carotid arteries: Secondary | ICD-10-CM | POA: Insufficient documentation

## 2023-03-01 DIAGNOSIS — R931 Abnormal findings on diagnostic imaging of heart and coronary circulation: Secondary | ICD-10-CM | POA: Insufficient documentation

## 2023-03-01 DIAGNOSIS — R079 Chest pain, unspecified: Secondary | ICD-10-CM

## 2023-03-01 DIAGNOSIS — I2510 Atherosclerotic heart disease of native coronary artery without angina pectoris: Secondary | ICD-10-CM | POA: Insufficient documentation

## 2023-03-01 MED ORDER — NITROGLYCERIN 0.4 MG SL SUBL
0.8000 mg | SUBLINGUAL_TABLET | Freq: Once | SUBLINGUAL | Status: AC
  Administered 2023-03-01: 0.8 mg via SUBLINGUAL

## 2023-03-01 MED ORDER — DILTIAZEM HCL 25 MG/5ML IV SOLN: 10.0000 mg | INTRAVENOUS | Status: DC | PRN

## 2023-03-01 MED ORDER — DILTIAZEM HCL 25 MG/5ML IV SOLN
10.0000 mg | INTRAVENOUS | Status: DC | PRN
  Administered 2023-03-01: 5 mg via INTRAVENOUS

## 2023-03-01 MED ORDER — METOPROLOL TARTRATE 5 MG/5ML IV SOLN
10.0000 mg | Freq: Once | INTRAVENOUS | Status: AC | PRN
  Administered 2023-03-01: 5 mg via INTRAVENOUS

## 2023-03-01 MED ORDER — IOHEXOL 350 MG/ML SOLN
95.0000 mL | Freq: Once | INTRAVENOUS | Status: AC | PRN
  Administered 2023-03-01: 95 mL via INTRAVENOUS

## 2023-03-01 MED ORDER — METOPROLOL TARTRATE 5 MG/5ML IV SOLN
INTRAVENOUS | Status: AC
  Filled 2023-03-01: qty 5

## 2023-03-01 MED ORDER — DILTIAZEM HCL 25 MG/5ML IV SOLN
INTRAVENOUS | Status: AC
  Filled 2023-03-01: qty 5

## 2023-03-01 MED ORDER — NITROGLYCERIN 0.4 MG SL SUBL
SUBLINGUAL_TABLET | SUBLINGUAL | Status: AC
  Filled 2023-03-01: qty 2

## 2023-03-01 NOTE — Progress Notes (Signed)
 HR @ 75 on breath hold after diltiazem. SBP @ 112 from 156. Per MD Shirlee Latch, give 250cc bolus and dose of metoprolol after BP recheck then reevaluate.

## 2023-03-01 NOTE — Progress Notes (Signed)
 Pt tolerated scan, no symptoms noted on completion following contrast and IV BP medication. Pt educated on need to check BP prior to taking home BP medication, pt and son verbalized understanding.

## 2023-03-12 ENCOUNTER — Telehealth: Payer: Self-pay | Admitting: Cardiovascular Disease

## 2023-03-12 NOTE — Telephone Encounter (Signed)
Pt is requesting a callback regarding her recent results and the plan moving forward. She stated she hasn't heard anything since the 15th and someone was supposed to get back to her. Please advise.

## 2023-03-12 NOTE — Telephone Encounter (Signed)
Called and spoke to patient. Verified name and DOB.Patient is calling to see what is her next step after her testing. Below message relayed to patient. She will come tomorrow and have labs drawn. Appt was scheduled with Dr Allyson Sabal 1/12 at 2:00 pm   Hi Samantha Hinton,   At this point we will need to proceed with aggressive risk reduction. I see that Dr. Allyson Sabal ordered fasting cholesterol levels for you but they have not be done yet. We would like to see recent cholesterol levels to better advise you.    FASTING labs can be done at our office or any [facility] location.    Once we have these numbers Dr. Allyson Sabal can give you further recommendations.   Thank you!

## 2023-03-25 ENCOUNTER — Ambulatory Visit (HOSPITAL_BASED_OUTPATIENT_CLINIC_OR_DEPARTMENT_OTHER): Payer: Medicare Other | Attending: Family Medicine

## 2023-04-03 ENCOUNTER — Ambulatory Visit: Payer: Medicare Other | Admitting: Cardiovascular Disease

## 2023-04-09 ENCOUNTER — Ambulatory Visit: Payer: Medicare Other | Attending: Cardiovascular Disease | Admitting: Cardiovascular Disease

## 2023-04-09 ENCOUNTER — Encounter: Payer: Self-pay | Admitting: Cardiovascular Disease

## 2023-04-09 VITALS — BP 178/89 | HR 84 | Ht 65.0 in | Wt 216.2 lb

## 2023-04-09 DIAGNOSIS — E782 Mixed hyperlipidemia: Secondary | ICD-10-CM

## 2023-04-09 DIAGNOSIS — R931 Abnormal findings on diagnostic imaging of heart and coronary circulation: Secondary | ICD-10-CM | POA: Diagnosis not present

## 2023-04-09 DIAGNOSIS — I10 Essential (primary) hypertension: Secondary | ICD-10-CM

## 2023-04-09 LAB — HEPATIC FUNCTION PANEL
ALT: 24 [IU]/L (ref 0–32)
AST: 29 [IU]/L (ref 0–40)
Albumin: 4.3 g/dL (ref 3.9–4.9)
Alkaline Phosphatase: 94 [IU]/L (ref 44–121)
Bilirubin Total: 0.9 mg/dL (ref 0.0–1.2)
Bilirubin, Direct: 0.27 mg/dL (ref 0.00–0.40)
Total Protein: 6.7 g/dL (ref 6.0–8.5)

## 2023-04-09 LAB — LIPID PANEL
Chol/HDL Ratio: 2.6 {ratio} (ref 0.0–4.4)
Cholesterol, Total: 209 mg/dL — ABNORMAL HIGH (ref 100–199)
HDL: 79 mg/dL (ref >39)
LDL Chol Calc (NIH): 85 mg/dL (ref 0–99)
Triglycerides: 277 mg/dL — ABNORMAL HIGH (ref 0–149)
VLDL Cholesterol Cal: 45 mg/dL — ABNORMAL HIGH (ref 5–40)

## 2023-04-09 NOTE — Assessment & Plan Note (Signed)
History of hyperlipidemia on low-dose rosuvastatin with lipid profile performed 05/11/2021 revealing total cholesterol 184, LDL of 87 and HDL of 58.  Given her elevated coronary calcium score she is not at goal for secondary prevention.  Will recheck a lipid liver profile this morning.  LDL goal less than 70

## 2023-04-09 NOTE — Assessment & Plan Note (Signed)
Coronary CTA performed 01/02/2023 revealed a coronary calcium score of 346 with disease in all 3 coronary arteries all of which were negative by FFR analysis.  Based on this we will aggressively treat her risk factors.

## 2023-04-09 NOTE — Patient Instructions (Signed)
Medication Instructions:  Your physician recommends that you continue on your current medications as directed. Please refer to the Current Medication list given to you today.  *If you need a refill on your cardiac medications before your next appointment, please call your pharmacy*   Lab Work: Your physician recommends that you have labs drawn today: Lipid/liver panel  If you have labs (blood work) drawn today and your tests are completely normal, you will receive your results only by: MyChart Message (if you have MyChart) OR A paper copy in the mail If you have any lab test that is abnormal or we need to change your treatment, we will call you to review the results.   Follow-Up: At [facility], you and your health needs are our priority.  As part of our continuing mission to provide you with exceptional heart care, we have created designated Provider Care Teams.  These Care Teams include your primary Cardiologist (physician) and Advanced Practice Providers (APPs -  Physician Assistants and Nurse Practitioners) who all work together to provide you with the care you need, when you need it.  We recommend signing up for the patient portal called "MyChart".  Sign up information is provided on this After Visit Summary.  MyChart is used to connect with patients for Virtual Visits (Telemedicine).  Patients are able to view lab/test results, encounter notes, upcoming appointments, etc.  Non-urgent messages can be sent to your provider as well.   To learn more about what you can do with MyChart, go to [email or web address].    Your next appointment:   6 month(s)  Provider:   Marjie Skiff, PA-C, Robet Leu, PA-C, Azalee Course, PA-C, Bernadene Person, NP, or Reather Littler, NP      Then, Nanetta Batty, MD will plan to see you again in 12 month(s).    Other Instructions Dr. Allyson Sabal has requested that you schedule an appointment with one of our clinical pharmacists for a blood pressure check  appointment within the next 6-8 weeks.  If you monitor your blood pressure (BP) at home, please bring your BP cuff and your BP readings with you to this appointment  HOW TO TAKE YOUR BLOOD PRESSURE: Rest 5 minutes before taking your blood pressure. Don't smoke or drink caffeinated beverages for at least 30 minutes before. Take your blood pressure before (not after) you eat. Sit comfortably with your back supported and both feet on the floor (don't cross your legs). Elevate your arm to heart level on a table or a desk. Use the proper sized cuff. It should fit smoothly and snugly around your bare upper arm. There should be enough room to slip a fingertip under the cuff. The bottom edge of the cuff should be 1 inch above the crease of the elbow. Ideally, take 3 measurements at one sitting and record the average.     1st Floor: - Lobby - Registration  - Pharmacy  - Lab - Cafe  2nd Floor: - PV Lab - Diagnostic Testing (echo, CT, nuclear med)  3rd Floor: - Vacant  4th Floor: - TCTS (cardiothoracic surgery) - AFib Clinic - Structural Heart Clinic - Vascular Surgery  - Vascular Ultrasound  5th Floor: - [facility] Cardiology (general and EP) - Clinical Pharmacy for coumadin, hypertension, lipid, weight-loss medications, and med management appointments    Valet parking services will be available as well.

## 2023-04-09 NOTE — Assessment & Plan Note (Signed)
History of essential hypertension with blood pressure measured today at 178/89.  She says her blood pressure was measured earlier this morning in the 140/80 range.  It sounds like her blood pressure is fairly labile.  She is on Ziac 10/6.25 as well as Catapres 0.1 mg p.o. twice daily.  I am going to have her keep a 30 blood pressure log and will see one of our Pharm.D.'s back in 4 to 6 weeks to review.

## 2023-04-09 NOTE — Progress Notes (Signed)
04/09/2023 [name]   1955/12/13  [id number]  Primary Physician Sharmon Revere, MD Primary Cardiologist: Runell Gess MD Nicholes Calamity, [place]  HPI:  Samantha Hinton is a 68 y.o.  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  She was referred by her PCP, Dr. Delia Chimes, for evaluation of chest pressure and dyspnea.  I last saw her in the office 12/25/2022.  Her risk factors include 55-pack-year history tobacco abuse having quit 9 years ago with COPD followed by Dr. Sherene Sires.  She has treated hypertension, diabetes and hyperlipidemia.  There is no family history of heart disease.  She is never had a heart attack or stroke.  She does complain of chest tightness with exertion over the last year as well as dyspnea on exertion.  She had EGD performed 11/22/2022 notable for esophagitis and gastric erosion.  She did have a biopsy for that.  She had a chest CT performed for cancer screening 03/21/2022 lipid showed moderate three-vessel coronary calcification.  She also had a 2D echocardiogram performed by her primary care physician on 09/26/2022 revealing normal LV systolic function, moderate concentric LVH.  A Zio patch revealed short runs of SVT and nonsustained VT.  Since I saw her I did order a coronary CTA 01/02/2023 revealing a coronary calcium score of 346 with nonobstructive disease in all 3 coronary arteries negative by FFR analysis.  Her blood pressure is fairly labile.  She is being treated for emphysema/COPD by Dr. Sherene Sires.  She does get occasional palpitations mostly at night.   Current Meds  Medication Sig   albuterol (PROAIR HFA) 108 (90 Base) MCG/ACT inhaler 2 puffs every 4 hours as needed only  if your can't catch your breath   allopurinol (ZYLOPRIM) 100 MG tablet TAKE 1 TABLET BY MOUTH TWICE A DAY   bisoprolol-hydrochlorothiazide (ZIAC) 5-6.25 MG tablet TAKE 1 TABLET  BY MOUTH TWICE A DAY   blood glucose meter kit and supplies KIT Dispense based on patient and insurance preference. Use up to four times daily as directed.   Budeson-Glycopyrrol-Formoterol (BREZTRI AEROSPHERE) 160-9-4.8 MCG/ACT AERO Inhale 2 puffs into the lungs in the morning and at bedtime.   celecoxib (CELEBREX) 200 MG capsule Take 200 mg by mouth every morning.   cloNIDine (CATAPRES) 0.1 MG tablet TAKE 1 TABLET BY MOUTH TWICE A DAY   furosemide (LASIX) 20 MG tablet Take 1 tablet (20 mg total) by mouth daily.   gabapentin (NEURONTIN) 300 MG capsule Take 300 mg by mouth at bedtime.   Glucose Blood (BLOOD GLUCOSE TEST STRIPS 333) STRP 1 strip by In Vitro route in the morning and at bedtime.   ipratropium-albuterol (DUONEB) 0.5-2.5 (3) MG/3ML SOLN Take 3 mLs by nebulization every 4 (four) hours as needed.   Lancets MISC 1 Device by Does not apply route 2 (two) times daily.   omeprazole (PRILOSEC) 40 MG capsule Take 30- 60 min before your first and last meals of the day   potassium chloride (KLOR-CON) 10 MEQ tablet Take 1 tablet (10 mEq total) by mouth daily.   rosuvastatin (CRESTOR) 10 MG tablet TAKE 1 TABLET BY MOUTH IN THE EVENING   [DISCONTINUED] bisoprolol-hydrochlorothiazide (ZIAC) 10-6.25 MG tablet Take 1 tablet by mouth daily.   [DISCONTINUED] Colchicine 0.6 MG CAPS Take 0.6 mg by mouth 2 (two) times daily as needed.   [DISCONTINUED] doxycycline (VIBRAMYCIN) 100 MG capsule Take 1 capsule (  100 mg total) by mouth 2 (two) times daily.   [DISCONTINUED] HYDROcodone-acetaminophen (NORCO/VICODIN) 5-325 MG tablet Take 2 tablets by mouth every 4 (four) hours as needed.   [DISCONTINUED] OZEMPIC, 0.25 OR 0.5 MG/DOSE, 2 MG/3ML SOPN Inject 0.5 mg into the skin once a week.   [DISCONTINUED] predniSONE (DELTASONE) 50 MG tablet Take 1 tablet (50 mg total) by mouth daily with breakfast.     Allergies  Allergen Reactions   Wellbutrin [Bupropion] Nausea And Vomiting    Social History   Socioeconomic  History   Marital status: Significant Other    Spouse name: Arnell Asal   Number of children: 2   Years of education: Not on file   Highest education level: Not on file  Occupational History   Occupation: retired    Associate Professor: [name]  Tobacco Use   Smoking status: Former    Current packs/day: 0.00    Average packs/day: 0.3 packs/day for 44.0 years (11.0 ttl pk-yrs)    Types: Cigarettes    Start date: 02/04/1970    Quit date: 02/04/2014    Years since quitting: 9.1   Smokeless tobacco: Never   Tobacco comments:    quit smoking in Dec 2015  Vaping Use   Vaping status: Former  Substance and Sexual Activity   Alcohol use: Yes    Alcohol/week: 4.0 standard drinks of alcohol    Types: 4 Standard drinks or equivalent per week    Comment: couple times a week   Drug use: No   Sexual activity: Not Currently    Birth control/protection: Surgical    Comment: 1st intercourse 68 yo-Fewer than 5 partners  Other Topics Concern   Not on file  Social History Narrative   6-8 hours of sleep per night   Lives with her fiance hh of 2 no pets    1 dog in the home   Retired no ets  But tob 8 per day   etoh ocass 1-2    On disability from her neck surgery predicaments .   orig from [employer] in [place] area.    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 2   Last Pap 2006 last mammogram 2000 and   Has dentures   FA    Social Drivers of Health   Financial Resource Strain: Medium Risk (12/21/2020)   Overall Financial Resource Strain (CARDIA)    Difficulty of Paying Living Expenses: Somewhat hard  Food Insecurity: No Food Insecurity (12/21/2020)   Hunger Vital Sign    Worried About Running Out of Food in the Last Year: Never true    Ran Out of Food in the Last Year: Never true  Transportation Needs: No Transportation Needs (12/21/2020)   PRAPARE - Administrator, Civil Service (Medical): No    Lack of Transportation (Non-Medical): No  Physical Activity:  Inactive (12/21/2020)   Exercise Vital Sign    Days of Exercise per Week: 0 days    Minutes of Exercise per Session: 0 min  Stress: No Stress Concern Present (12/21/2020)   [employer] of Occupational Health - Occupational Stress Questionnaire    Feeling of Stress : Only a little  Social Connections: Unknown (09/14/2021)   Received from [facility], [facility]   Social Network    Social Network: Not on file  Intimate Partner Violence: Unknown (09/14/2021)   Received from [facility], [facility]   HITS    Physically Hurt: Not on file  Insult or Talk Down To: Not on file    Threaten Physical Harm: Not on file    Scream or Curse: Not on file     Review of Systems: General: negative for chills, fever, night sweats or weight changes.  Cardiovascular: negative for chest pain, dyspnea on exertion, edema, orthopnea, palpitations, paroxysmal nocturnal dyspnea or shortness of breath Dermatological: negative for rash Respiratory: negative for cough or wheezing Urologic: negative for hematuria Abdominal: negative for nausea, vomiting, diarrhea, bright red blood per rectum, melena, or hematemesis Neurologic: negative for visual changes, syncope, or dizziness All other systems reviewed and are otherwise negative except as noted above.    Blood pressure (!) 178/89, pulse 84, height 5\' 5"  (1.651 m), weight 216 lb 3.2 oz (98.1 kg), SpO2 97%.  General appearance: alert and no distress Neck: no adenopathy, no carotid bruit, no JVD, supple, symmetrical, trachea midline, and thyroid not enlarged, symmetric, no tenderness/mass/nodules Lungs: clear to auscultation bilaterally Heart: regular rate and rhythm, S1, S2 normal, no murmur, click, rub or gallop Extremities: extremities normal, atraumatic, no cyanosis or edema Pulses: 2+ and symmetric Skin: Skin color, texture, turgor normal. No rashes or lesions Neurologic: Grossly normal  EKG not performed today      ASSESSMENT AND PLAN:    Essential hypertension, benign History of essential hypertension with blood pressure measured today at 178/89.  She says her blood pressure was measured earlier this morning in the 140/80 range.  It sounds like her blood pressure is fairly labile.  She is on Ziac 10/6.25 as well as Catapres 0.1 mg p.o. twice daily.  I am going to have her keep a 30 blood pressure log and will see one of our Pharm.D.'s back in 4 to 6 weeks to review.  Hyperlipidemia History of hyperlipidemia on low-dose rosuvastatin with lipid profile performed 05/11/2021 revealing total cholesterol 184, LDL of 87 and HDL of 58.  Given her elevated coronary calcium score she is not at goal for secondary prevention.  Will recheck a lipid liver profile this morning.  LDL goal less than 70  Elevated coronary artery calcium score Coronary CTA performed 01/02/2023 revealed a coronary calcium score of 346 with disease in all 3 coronary arteries all of which were negative by FFR analysis.  Based on this we will aggressively treat her risk factors.     Runell Gess MD FACP,FACC,FAHA, [facility] 04/09/2023 11:24 AM

## 2023-04-11 ENCOUNTER — Encounter: Payer: Self-pay | Admitting: *Deleted

## 2023-04-15 ENCOUNTER — Other Ambulatory Visit: Payer: Self-pay

## 2023-04-15 DIAGNOSIS — R931 Abnormal findings on diagnostic imaging of heart and coronary circulation: Secondary | ICD-10-CM

## 2023-04-15 DIAGNOSIS — E782 Mixed hyperlipidemia: Secondary | ICD-10-CM

## 2023-04-15 MED ORDER — ROSUVASTATIN CALCIUM 20 MG PO TABS: 20.0000 mg | ORAL_TABLET | Freq: Every evening | ORAL | 3 refills | Status: DC

## 2023-06-06 ENCOUNTER — Other Ambulatory Visit: Payer: Self-pay

## 2023-06-06 MED ORDER — BREZTRI AEROSPHERE 160-9-4.8 MCG/ACT IN AERO: 2.0000 | INHALATION_SPRAY | Freq: Two times a day (BID) | RESPIRATORY_TRACT | 3 refills | Status: DC

## 2023-06-07 ENCOUNTER — Ambulatory Visit: Payer: Medicare Other | Admitting: Pharmacist Clinician (PhC)/ Clinical Pharmacy Specialist

## 2023-06-24 ENCOUNTER — Encounter: Payer: Self-pay | Admitting: Family Medicine

## 2023-07-29 ENCOUNTER — Ambulatory Visit
Attending: Pharmacist Clinician (PhC)/ Clinical Pharmacy Specialist | Admitting: Pharmacist Clinician (PhC)/ Clinical Pharmacy Specialist

## 2023-07-29 NOTE — Progress Notes (Deleted)
 Office Visit    Patient Name: Samantha Hinton Date of Encounter: 07/29/2023  Primary Care Provider:  Lorella Roles, MD Primary Cardiologist:  None  Chief Complaint    Hypertension  Significant Past Medical History   DM2   HLD   CAD CAC 346, LDL 85 on rosuvastatin  10, increased to 20 mg          Allergies  Allergen Reactions   Wellbutrin [Bupropion] Nausea And Vomiting    History of Present Illness    Samantha Hinton is a 68 y.o. female patient of Dr Katheryne Pane, in the office today for hypertension evaluation.  178/89  Blood Pressure Goal:  130/80  Current Medications:  bisoprolol  hctz 5/6.25 mg bid, clonidine  0.1 mg bid, furosemide  20 mg every day,  Previously tried:  losartan , losartan  hctz  (5-6 2019), nebivolol  5 mg 03-2012  Family Hx:     Social Hx:      Tobacco:  Alcohol:  Caffeine: Diet:      Exercise:   Home BP readings:      Adherence Assessment  Do you ever forget to take your medication? [] Yes [] No  Do you ever skip doses due to side effects? [] Yes [] No  Do you have trouble affording your medicines? [] Yes [] No  Are you ever unable to pick up your medication due to transportation difficulties? [] Yes [] No  Do you ever stop taking your medications because you don't believe they are helping? [] Yes [] No  Do you check your weight daily? [] Yes [] No   Adherence strategy: ***  Barriers to obtaining medications: ***     Accessory Clinical Findings    Lab Results  Component Value Date   CREATININE 0.84 02/07/2023   BUN 18 02/07/2023   NA 138 02/07/2023   K 4.3 02/07/2023   CL 94 (L) 02/07/2023   CO2 32 02/07/2023   Lab Results  Component Value Date   ALT 24 04/09/2023   AST 29 04/09/2023   ALKPHOS 94 04/09/2023   BILITOT 0.9 04/09/2023   Lab Results  Component Value Date   HGBA1C 5.7 (A) 11/13/2021    Home Medications    Current Outpatient Medications  Medication Sig Dispense Refill   albuterol  (PROAIR  HFA) 108 (90  Base) MCG/ACT inhaler 2 puffs every 4 hours as needed only  if your can't catch your breath 18 g 11   allopurinol  (ZYLOPRIM ) 100 MG tablet TAKE 1 TABLET BY MOUTH TWICE A DAY 180 tablet 1   bisoprolol -hydrochlorothiazide  (ZIAC ) 5-6.25 MG tablet TAKE 1 TABLET BY MOUTH TWICE A DAY 180 tablet 2   blood glucose meter kit and supplies KIT Dispense based on patient and insurance preference. Use up to four times daily as directed. 1 each 0   budeson-glycopyrrolate -formoterol  (BREZTRI  AEROSPHERE) 160-9-4.8 MCG/ACT AERO inhaler Inhale 2 puffs into the lungs in the morning and at bedtime. 32.1 g 3   celecoxib (CELEBREX) 200 MG capsule Take 200 mg by mouth every morning.     cloNIDine  (CATAPRES ) 0.1 MG tablet TAKE 1 TABLET BY MOUTH TWICE A DAY 60 tablet 2   furosemide  (LASIX ) 20 MG tablet Take 1 tablet (20 mg total) by mouth daily. 30 tablet 0   gabapentin  (NEURONTIN ) 300 MG capsule Take 300 mg by mouth at bedtime.     Glucose Blood (BLOOD GLUCOSE TEST STRIPS 333) STRP 1 strip by In Vitro route in the morning and at bedtime. 200 strip 1   ipratropium-albuterol  (DUONEB) 0.5-2.5 (3) MG/3ML SOLN Take 3 mLs by nebulization  every 4 (four) hours as needed. 360 mL 0   Lancets MISC 1 Device by Does not apply route 2 (two) times daily. 200 each 1   metoprolol  tartrate (LOPRESSOR ) 100 MG tablet Take 1 tablet (100 mg total) by mouth once for 1 dose. Take 2 hrs prior to cardiac testing 1 tablet 0   omeprazole  (PRILOSEC) 40 MG capsule Take 30- 60 min before your first and last meals of the day 60 capsule 11   potassium chloride  (KLOR-CON ) 10 MEQ tablet Take 1 tablet (10 mEq total) by mouth daily. 30 tablet 0   rosuvastatin  (CRESTOR ) 20 MG tablet Take 1 tablet (20 mg total) by mouth every evening. 90 tablet 3   No current facility-administered medications for this visit.     No BP recorded.  {Refresh Note OR Click here to enter BP  :1}***   Assessment & Plan    No problem-specific Assessment & Plan notes found for  this encounter.   Selita Staiger PharmD CPP [facility]  [address] [place], [place] [address] [phone]

## 2023-08-19 ENCOUNTER — Other Ambulatory Visit: Payer: Self-pay

## 2023-08-19 MED ORDER — ALBUTEROL SULFATE HFA 108 (90 BASE) MCG/ACT IN AERS: INHALATION_SPRAY | RESPIRATORY_TRACT | 11 refills | Status: DC

## 2023-08-26 ENCOUNTER — Other Ambulatory Visit: Payer: Self-pay

## 2023-08-26 MED ORDER — ROSUVASTATIN CALCIUM 20 MG PO TABS: 20.0000 mg | ORAL_TABLET | Freq: Every evening | ORAL | 2 refills | Status: AC

## 2023-09-21 NOTE — Progress Notes (Unsigned)
 Subjective:   Patient ID: Samantha Hinton, female    DOB: 1955-02-27 MRN: [id number]    Brief patient profile:  69  yowf MM quit smoking 01/2014  with GOLD II COPD  baseline doe x best days able to do extensive shopping s 02 better with albuterol  when had access to it then admitted [facility]:   Admit Date: 02/17/2012  PRIMARY DISCHARGE DIAGNOSIS:  URI (upper respiratory infection)  COPD exacerbation  SOB (shortness of breath)  Hypoxemia  UTI (lower urinary tract infection)  Diarrhea  Tobacco abuse     08/11/2018 acute extended ov/Samantha Hinton re:  Re-establish GOLD II copd/ worse sob still on symb 160 2bid  Chief Complaint  Patient presents with   Pulmonary Consult    Self referral. Pt c/o increased SOB since Spring 2020. She gets winded walking room to room at home.   MMRC3 = can't walk 100 yards even at a slow pace at a flat grade s stopping due to sob  Progressive worse no variability since December 2019 Hs = gurgly on back / overt hb on just prilosec 20 mg not ac No cough  Mild leg swelling   Neb but never before ex  rec Plan A = Automatic = Bevespi  Take 2 puffs first thing in am and then another 2 puffs about 12 hours later.  Work on inhaler technique:   Plan B = Backup Only use your albuterol  inhaler as a rescue medication to be used if you can't catch your breath Plan C = Crisis - only use your albuterol  nebulizer if you first try Plan B and it fails to help > ok to use the nebulizer up to every 4 hours but if start needing it regularly call for immediate appointment Omeprazole  40 mg Take 30- 60 min before your first and last meals of the day  GERD diet       12/08/2021  f/u ov/Samantha Hinton re: GOLD 2  maint on breztri  2bid /  pred is plan D  Chief Complaint  Patient presents with   Follow-up    Increased cough and wheezing over the past wk- on pred taper and this has helped some.   Dyspnea:  wm shopping / walking to grandson's football games much better on pred Cough: none   Sleeping: 30 degrees no resp cc  SABA use: twice daily before exertion  02: none  Rec Plan  A = Automatic = Always=    Breztri  Take 2 puffs first thing in am and then another 2 puffs about 12 hours later.   Plan B = Backup (to supplement plan A, not to replace it) Only use your albuterol  inhaler as a rescue   Plan C = Crisis (instead of Plan B but only if Plan B stops working) - only use your albuterol  nebulizer if you first try Plan B   Plan D = Deltasone  when ABC not working  Prednisone  10 mg take  4 each am x 2 days,   2 each am x 2 days,  1 each am x 2 days and stop      12/17/2022  f/u ov/[place] office/Samantha Hinton re: GOLD 2 copd  maint on breztri   / on study injections x 3 month Chief Complaint  Patient presents with   Follow-up    SOB and wheezing persistent.  Patient is in a study for COPD.  Drug taking is helping with sx.  Dyspnea:  improving even when not on prednsione Cough: none  Sleeping: 30 degrees s  resp cc  SABA use: less since study drug 02: none Lung cancer screening: q jan  Rec  No change rx = ABCD action plan  F/u yearly sooner prn    09/24/2023  f/u ov/Samantha Hinton re: GOLD 2 copd  maint on breztri  and study drug  / last took pred  in Nov 2024 and q q2 weeks injections Chief Complaint  Patient presents with   COPD    Needs med refill.  Needs prednisone , Breztri  ([facility]) last shipment on it's way, albuterol .  [facility]eds [facility] ([facility]  In research study for COPD, ends tomorrow.  Feels better.  Can start another study and get actual med.  Dyspnea:  very sedentary / no longer shopping/ limited by claudication and neuropathy Hallway at appt  Cough: none  Sleeping: 30 dgrees/ one pillow s  resp cc  SABA use: neb only for flares  02: none  Lung cancer screening :  refer back to [facility]     No obvious day to day or daytime variability or assoc excess/ purulent sputum or mucus plugs or hemoptysis or cp or chest tightness, subjective wheeze or overt sinus or hb  symptoms.    Also denies any obvious fluctuation of symptoms with weather or environmental changes or other aggravating or alleviating factors except as outlined above   No unusual exposure hx or h/o childhood pna/ asthma or knowledge of premature birth.  Current Allergies, Complete Past Medical History, Past Surgical History, Family History, and Social History were reviewed in [employer] record.  ROS  The following are not active complaints unless bolded Hoarseness, sore throat, dysphagia, dental problems, itching, sneezing,  nasal congestion or discharge of excess mucus or purulent secretions, ear ache,   fever, chills, sweats, unintended wt loss or wt gain, classically pleuritic or exertional cp,  orthopnea pnd or arm/hand swelling  or leg swelling, presyncope, palpitations, abdominal pain, anorexia, nausea, vomiting, diarrhea  or change in bowel habits or change in bladder habits, change in stools or change in urine, dysuria, hematuria,  rash, arthralgias, visual complaints, headache, numbness, weakness or ataxia or problems with walking or coordination,  change in mood or  memory.        Current Meds  Medication Sig   albuterol  (PROAIR  HFA) 108 (90 Base) MCG/ACT inhaler 2 puffs every 4 hours as needed only  if your can't catch your breath   allopurinol  (ZYLOPRIM ) 100 MG tablet TAKE 1 TABLET BY MOUTH TWICE A DAY   bisoprolol -hydrochlorothiazide  (ZIAC ) 10-6.25 MG tablet Take 1 tablet by mouth daily.   blood glucose meter kit and supplies KIT Dispense based on patient and insurance preference. Use up to four times daily as directed.   budeson-glycopyrrolate -formoterol  (BREZTRI  AEROSPHERE) 160-9-4.8 MCG/ACT AERO inhaler Inhale 2 puffs into the lungs in the morning and at bedtime.   budesonide -glycopyrrolate -formoterol  (BREZTRI  AEROSPHERE) 160-9-4.8 MCG/ACT AERO inhaler Inhale 2 puffs into the lungs in the morning and at bedtime.   celecoxib (CELEBREX) 200 MG capsule Take  200 mg by mouth every morning.   cloNIDine  (CATAPRES ) 0.1 MG tablet TAKE 1 TABLET BY MOUTH TWICE A DAY   furosemide  (LASIX ) 20 MG tablet Take 1 tablet (20 mg total) by mouth daily. (Patient taking differently: Take 20 mg by mouth as needed.)   Glucose Blood (BLOOD GLUCOSE TEST STRIPS 333) STRP 1 strip by In Vitro route in the morning and at bedtime.   ipratropium-albuterol  (DUONEB) 0.5-2.5 (3) MG/3ML SOLN Take 3 mLs by nebulization every 4 (four)  hours as needed.   Lancets MISC 1 Device by Does not apply route 2 (two) times daily.   omeprazole  (PRILOSEC) 40 MG capsule Take 30- 60 min before your first and last meals of the day   potassium chloride  (KLOR-CON ) 10 MEQ tablet Take 1 tablet (10 mEq total) by mouth daily. (Patient taking differently: Take 10 mEq by mouth as needed.)   rosuvastatin  (CRESTOR ) 20 MG tablet Take 1 tablet (20 mg total) by mouth every evening.   [DISCONTINUED] bisoprolol -hydrochlorothiazide  (ZIAC ) 5-6.25 MG tablet TAKE 1 TABLET BY MOUTH TWICE A DAY             Objective:   Physical Exam  wts  09/24/2023      217   12/17/2022  204  12/08/2021 208   11/14/2020   204  11/14/2020   232 06/29/2019  224  12/29/2018  226  09/10/2018  227  08/11/2018  226  05/08/2012  Wt 195  > 199 06/12/2012 > 201 08/01/2012 > 09/23/2012  204 > 06/19/2013  199 > 07/01/2013  195 > 08/13/2014 183     04/10/12 194 lb 9.6 oz (88.27 kg)  02/20/12 190 lb 12.8 oz (86.546 kg)      Vital signs reviewed  09/24/2023  - Note at rest 02 sats  95% on RA   General appearance:    pleasant amb wf nad   HEENT :  Oropharynx  clear/ upper denture/ lower partial with one severely decayed  Nasal turbinates nl   NECK :  without JVD/Nodes/TM/ nl carotid upstrokes bilaterally   LUNGS: no acc muscle use,  Mod barrel  contour chest wall with bilateral  Distant bs s audible wheeze and  without cough on insp or exp maneuvers and mod  Hyperresonant  to  percussion bilaterally     CV:  RRR  no s3 or murmur or increase  in P2, and no edema   ABD:  soft and nontender with pos mid insp Hoover's  in the supine position. No bruits or organomegaly appreciated, bowel sounds nl  MS:   Ext warm without deformities or   obvious joint restrictions , calf tenderness, cyanosis or clubbing  SKIN: warm and dry without lesions    NEURO:  alert, approp, nl sensorium with  no motor or cerebellar deficits apparent.              Assessment & Plan:

## 2023-09-24 ENCOUNTER — Encounter: Payer: Self-pay | Admitting: Internal Medicine

## 2023-09-24 ENCOUNTER — Telehealth: Payer: Self-pay | Admitting: *Deleted

## 2023-09-24 ENCOUNTER — Ambulatory Visit (INDEPENDENT_AMBULATORY_CARE_PROVIDER_SITE_OTHER): Admitting: Internal Medicine

## 2023-09-24 VITALS — BP 120/74 | HR 81 | Temp 98.3°F | Ht 65.0 in | Wt 217.6 lb

## 2023-09-24 DIAGNOSIS — Z87891 Personal history of nicotine dependence: Secondary | ICD-10-CM

## 2023-09-24 DIAGNOSIS — J449 Chronic obstructive pulmonary disease, unspecified: Secondary | ICD-10-CM | POA: Diagnosis not present

## 2023-09-24 MED ORDER — BREZTRI AEROSPHERE 160-9-4.8 MCG/ACT IN AERO: 2.0000 | INHALATION_SPRAY | Freq: Two times a day (BID) | RESPIRATORY_TRACT | 3 refills | Status: AC

## 2023-09-24 MED ORDER — PREDNISONE 10 MG PO TABS: ORAL_TABLET | ORAL | 11 refills | Status: AC

## 2023-09-24 NOTE — Assessment & Plan Note (Addendum)
 Referred back for LDSCT 09/24/2023 >>>   Discussed in detail all the  indications, usual  risks and alternatives  relative to the benefits with patient who agrees to proceed with w/u as outlined.     Each maintenance medication was reviewed in detail including emphasizing most importantly the difference between maintenance and prns and under what circumstances the prns are to be triggered using an action plan format where appropriate.  Total time for H and P, chart review, counseling, reviewing hfa/ neb device(s) and generating customized AVS unique to this office visit / same day charting = 22 min

## 2023-09-24 NOTE — Patient Instructions (Signed)
No change in medications   Please schedule a follow up visit in 12  months but call sooner if needed  

## 2023-09-24 NOTE — Telephone Encounter (Signed)
 Prescription for Breztri  printed out and signed by Dr. Darlean for patient assistance.  Script faxed to [facility], [phone].  Received a confirmation fax that fax had been sent successfully.  Nothing further needed.

## 2023-09-24 NOTE — Assessment & Plan Note (Signed)
 Quit smoking 01/2014   - PFT's 06/12/2012 FEV1  1.41 (60%) ratio 64 and no better p B2  And DLCO 51 corrects to 94 % - 06/12/2012 refer to Rehab - 08/02/2012 started tudorza > improved - 06/19/2013  >  Started symbicort  2 bid  - 08/11/2018    change symb to bevespi  2 bid And max gerd rx   Alpha one AT screen 08/11/2018  MM  Level  145  - 09/10/2018   continue bevespi  - 06/29/2019   change to breztri   - 05/05/2020 added pred x 6 days as Plan D  - 12/08/2021  After extensive coaching inhaler device,  effectiveness =    90% > continue breztri   - 08/2022 started study at [facility] with  injections and by 12/17/2022 feel something is working with less pred/ saba   Clearly improved but very sedentary due to circulation of legs and neuropathy    Group D (now reclassified as E) in terms of symptom/risk and laba/lama/ICS  therefore appropriate rx at this point >>>  breztri  and more approp saba with pred as plan D not needed since last fall  Re SABA :  I spent extra time with pt today reviewing appropriate use of albuterol  for prn use on exertion with the following points: 1) saba is for relief of sob that does not improve by walking a slower pace or resting but rather if the pt does not improve after trying this first. 2) If the pt is convinced, as many are, that saba helps recover from activity faster then it's easy to tell if this is the case by re-challenging : ie stop, take the inhaler, then p 5 minutes try the exact same activity (intensity of workload) that just caused the symptoms and see if they are substantially diminished or not after saba 3) if there is an activity that reproducibly causes the symptoms, try the saba 15 min before the activity on alternate days   If in fact the saba really does help, then fine to continue to use it prn but advised may need to look closer at the maintenance regimen being used to achieve better control of airways disease with exertion.

## 2023-10-11 ENCOUNTER — Ambulatory Visit (HOSPITAL_BASED_OUTPATIENT_CLINIC_OR_DEPARTMENT_OTHER)

## 2023-10-24 ENCOUNTER — Ambulatory Visit (HOSPITAL_BASED_OUTPATIENT_CLINIC_OR_DEPARTMENT_OTHER)

## 2023-10-24 ENCOUNTER — Telehealth (HOSPITAL_BASED_OUTPATIENT_CLINIC_OR_DEPARTMENT_OTHER): Payer: Self-pay

## 2023-10-24 NOTE — Telephone Encounter (Signed)
 I held on the phone for 17 mins with the below number and never got to talk to anyone. Albuterol  inhaler is listed in pts historical meds and marked taking as needed. Not sure what else is needed as I was unable to talk to anyone.   Copied from CRM #[id number]. Topic: Clinical - Prescription Issue >> Oct 23, 2023  9:46 AM Samantha Hinton wrote: Reason for CRM: [name] WITH [facility] CALLING ABOUT THE PATIENT PRESCRIPTION FOR albuterol  INHALER 90 but it is not showing on her list of meds at all. The contact number is [phone]

## 2023-10-28 NOTE — Telephone Encounter (Signed)
 Called the number provided and was on hold for approx 10 min with no response  Closing encounter per protocol

## 2023-12-27 ENCOUNTER — Encounter: Payer: Self-pay | Admitting: *Deleted

## 2023-12-30 ENCOUNTER — Ambulatory Visit (HOSPITAL_BASED_OUTPATIENT_CLINIC_OR_DEPARTMENT_OTHER)
Admission: RE | Admit: 2023-12-30 | Discharge: 2023-12-30 | Disposition: A | Discharge status: Home / Self Care | Source: Ambulatory Visit | Attending: Acute Care | Admitting: Acute Care

## 2023-12-30 ENCOUNTER — Ambulatory Visit: Admitting: Emergency Medicine

## 2023-12-30 DIAGNOSIS — Z87891 Personal history of nicotine dependence: Secondary | ICD-10-CM | POA: Insufficient documentation

## 2023-12-30 DIAGNOSIS — Z122 Encounter for screening for malignant neoplasm of respiratory organs: Secondary | ICD-10-CM | POA: Diagnosis present

## 2023-12-30 NOTE — Progress Notes (Deleted)
  Cardiology Office Note:    Date:  12/30/2023  ID:  Samantha Hinton, DOB 19-Jan-1956, MRN [id number] PCP: Haze Kingfisher, MD  [facility] Providers Cardiologist:  None { Click to update primary MD,subspecialty MD or APP then REFRESH:1}    {Click to Open Review  :1}   Patient Profile:       Chief Complaint: *** History of Present Illness:  Samantha Hinton is a 69 y.o. female with visit-pertinent history of coronary artery disease, hypertension, hyperlipidemia  Patient established with cardiology service on 12/25/2022 for evaluation of chest pressure and dyspnea.  Her risk factors include a 55-pack-year history of tobacco abuse having quit 9 years prior with COPD followed by Dr. Darlean.  She had a chest CT performed for cancer screening on 03/21/2022 that showed moderate three-vessel coronary calcification.  She also had a 2D echocardiogram performed by her primary care physician on 09/26/2022 revealing normal LV systolic function, moderate concentric LVH.  A Zio patch revealed short runs of SVT and nonsustained VT.  Patient ultimately had been complaining of exertional chest pressure for 1 year.  She underwent coronary CTA on 01/02/2023 showing coronary calcium  score 346 (91st percentile).  CT FFR analysis did not show any significant stenosis  She was last seen in clinic on 04/09/2023.  Her blood pressure was elevated at 178/89.  She does not check her blood pressure for 30 days and follow-up with Pharm.D. however failed to follow-up.  Discussed the use of AI scribe software for clinical note transcription with the patient, who gave verbal consent to proceed.  History of Present Illness     Review of systems:  Please see the history of present illness. All other systems are reviewed and otherwise negative. ***      Studies Reviewed:        ***  Risk Assessment/Calculations:   {Does this patient have ATRIAL FIBRILLATION?:[id number]} No BP recorded.  {Refresh Note OR Click  here to enter BP  :1}***        Physical Exam:   VS:  There were no vitals taken for this visit.   Wt Readings from Last 3 Encounters:  09/24/23 217 lb 9.6 oz (98.7 kg)  04/09/23 216 lb 3.2 oz (98.1 kg)  12/25/22 204 lb 9.6 oz (92.8 kg)    GEN: Well nourished, well developed in no acute distress NECK: No JVD; No carotid bruits CARDIAC: ***RRR, no murmurs, rubs, gallops RESPIRATORY:  Clear to auscultation without rales, wheezing or rhonchi  ABDOMEN: Soft, non-tender, non-distended EXTREMITIES:  No edema; No acute deformity ***      Assessment and Plan:  Coronary artery disease Coronary CTA 12/2022 with coronary calcium  score of 346 (91st percentile) with extensive three-vessel CAD with FFR analysis showing no significant stenosis  Hypertension  Hyperlipidemia  COPD -Management per pulmonology Assessment & Plan      {Are you ordering a CV Procedure (e.g. stress test, cath, DCCV, TEE, etc)?   Press F2        :[id number]}  Dispo:  No follow-ups on file.  Signed, Lum LITTIE Louis, NP

## 2024-01-03 ENCOUNTER — Other Ambulatory Visit: Payer: Self-pay | Admitting: Acute Care

## 2024-01-03 DIAGNOSIS — Z87891 Personal history of nicotine dependence: Secondary | ICD-10-CM

## 2024-01-03 DIAGNOSIS — Z122 Encounter for screening for malignant neoplasm of respiratory organs: Secondary | ICD-10-CM

## 2024-02-25 ENCOUNTER — Ambulatory Visit: Admitting: Emergency Medicine

## 2024-03-02 ENCOUNTER — Telehealth (HOSPITAL_BASED_OUTPATIENT_CLINIC_OR_DEPARTMENT_OTHER): Payer: Self-pay | Admitting: Family Medicine

## 2024-03-02 MED ORDER — TIZANIDINE HCL 4 MG PO TABS: 4.0000 mg | ORAL_TABLET | Freq: Three times a day (TID) | ORAL | 0 refills | Status: AC | PRN

## 2024-03-02 NOTE — Telephone Encounter (Signed)
 Sending Zanaflex  for back pain
